# Patient Record
Sex: Female | Born: 1960 | Race: White | Hispanic: No | Smoking: Never smoker
Health system: Southern US, Community
[De-identification: ages and names within clinical notes are randomized; demographics above are authoritative.]

## PROBLEM LIST (undated history)

## (undated) DIAGNOSIS — M51369 Other intervertebral disc degeneration, lumbar region without mention of lumbar back pain or lower extremity pain: Secondary | ICD-10-CM

## (undated) DIAGNOSIS — G709 Myoneural disorder, unspecified: Secondary | ICD-10-CM

## (undated) DIAGNOSIS — T7840XA Allergy, unspecified, initial encounter: Secondary | ICD-10-CM

## (undated) DIAGNOSIS — M5136 Other intervertebral disc degeneration, lumbar region: Secondary | ICD-10-CM

## (undated) DIAGNOSIS — E538 Deficiency of other specified B group vitamins: Secondary | ICD-10-CM

## (undated) DIAGNOSIS — E559 Vitamin D deficiency, unspecified: Secondary | ICD-10-CM

## (undated) DIAGNOSIS — R0789 Other chest pain: Secondary | ICD-10-CM

## (undated) DIAGNOSIS — M5416 Radiculopathy, lumbar region: Secondary | ICD-10-CM

## (undated) DIAGNOSIS — D509 Iron deficiency anemia, unspecified: Secondary | ICD-10-CM

## (undated) DIAGNOSIS — E119 Type 2 diabetes mellitus without complications: Secondary | ICD-10-CM

## (undated) DIAGNOSIS — M1990 Unspecified osteoarthritis, unspecified site: Secondary | ICD-10-CM

## (undated) DIAGNOSIS — G894 Chronic pain syndrome: Secondary | ICD-10-CM

## (undated) DIAGNOSIS — L259 Unspecified contact dermatitis, unspecified cause: Secondary | ICD-10-CM

## (undated) DIAGNOSIS — R079 Chest pain, unspecified: Secondary | ICD-10-CM

## (undated) DIAGNOSIS — M48061 Spinal stenosis, lumbar region without neurogenic claudication: Secondary | ICD-10-CM

## (undated) DIAGNOSIS — F32A Depression, unspecified: Secondary | ICD-10-CM

## (undated) HISTORY — PX: BACK SURGERY: SHX140

## (undated) HISTORY — DX: Iron deficiency anemia, unspecified: D50.9

## (undated) HISTORY — PX: CHOLECYSTECTOMY: SHX55

## (undated) HISTORY — DX: Chest pain, unspecified: R07.9

## (undated) HISTORY — DX: Other chest pain: R07.89

## (undated) HISTORY — DX: Unspecified osteoarthritis, unspecified site: M19.90

## (undated) HISTORY — PX: BUNIONECTOMY: SHX129

## (undated) HISTORY — DX: Type 2 diabetes mellitus without complications: E11.9

## (undated) HISTORY — DX: Radiculopathy, lumbar region: M54.16

## (undated) HISTORY — PX: APPENDECTOMY: SHX54

## (undated) HISTORY — DX: Allergy, unspecified, initial encounter: T78.40XA

## (undated) HISTORY — DX: Unspecified contact dermatitis, unspecified cause: L25.9

---

## 2005-10-11 ENCOUNTER — Emergency Department: Payer: Self-pay | Admitting: Emergency Medicine

## 2005-10-12 ENCOUNTER — Ambulatory Visit: Payer: Self-pay | Admitting: Emergency Medicine

## 2005-11-13 ENCOUNTER — Ambulatory Visit: Payer: Self-pay | Admitting: Podiatry

## 2006-07-27 ENCOUNTER — Observation Stay: Payer: Self-pay | Admitting: General Surgery

## 2007-04-26 ENCOUNTER — Emergency Department: Payer: Self-pay

## 2008-11-02 HISTORY — PX: LUMBAR SPINE SURGERY: SHX701

## 2012-11-02 HISTORY — PX: KNEE ARTHROSCOPY: SHX127

## 2012-11-02 HISTORY — PX: BUNIONECTOMY: SHX129

## 2012-11-02 HISTORY — PX: CHOLECYSTECTOMY: SHX55

## 2013-11-02 HISTORY — PX: APPENDECTOMY: SHX54

## 2013-11-28 ENCOUNTER — Ambulatory Visit: Payer: Self-pay | Admitting: Family Medicine

## 2013-12-06 ENCOUNTER — Ambulatory Visit: Payer: Self-pay | Admitting: Pain Medicine

## 2014-01-17 ENCOUNTER — Ambulatory Visit: Payer: Self-pay | Admitting: Pain Medicine

## 2014-01-17 LAB — BASIC METABOLIC PANEL
Anion Gap: 2 — ABNORMAL LOW (ref 7–16)
BUN: 8 mg/dL (ref 7–18)
Calcium, Total: 8.5 mg/dL (ref 8.5–10.1)
Chloride: 109 mmol/L — ABNORMAL HIGH (ref 98–107)
Co2: 27 mmol/L (ref 21–32)
Creatinine: 0.69 mg/dL (ref 0.60–1.30)
EGFR (African American): >60
EGFR (Non-African Amer.): >60
Glucose: 88 mg/dL (ref 65–99)
Osmolality: 273 (ref 275–301)
Potassium: 4.1 mmol/L (ref 3.5–5.1)
Sodium: 138 mmol/L (ref 136–145)

## 2014-01-17 LAB — HEPATIC FUNCTION PANEL A (ARMC)
Albumin: 3.6 g/dL (ref 3.4–5.0)
Alkaline Phosphatase: 70 U/L
Bilirubin, Direct: <0.1 mg/dL (ref 0.00–0.20)
Bilirubin,Total: 0.3 mg/dL (ref 0.2–1.0)
SGOT(AST): 15 U/L (ref 15–37)
SGPT (ALT): 12 U/L (ref 12–78)
Total Protein: 7.1 g/dL (ref 6.4–8.2)

## 2014-01-17 LAB — MAGNESIUM: MAGNESIUM: 1.9 mg/dL

## 2014-01-17 LAB — SEDIMENTATION RATE: Erythrocyte Sed Rate: 23 mm/hr (ref 0–30)

## 2014-01-23 ENCOUNTER — Ambulatory Visit: Payer: Self-pay | Admitting: Pain Medicine

## 2014-02-12 ENCOUNTER — Ambulatory Visit: Payer: Self-pay | Admitting: Pain Medicine

## 2014-03-01 DIAGNOSIS — G8929 Other chronic pain: Secondary | ICD-10-CM | POA: Insufficient documentation

## 2014-03-01 DIAGNOSIS — L259 Unspecified contact dermatitis, unspecified cause: Secondary | ICD-10-CM | POA: Insufficient documentation

## 2014-03-01 DIAGNOSIS — M549 Dorsalgia, unspecified: Secondary | ICD-10-CM

## 2014-03-01 DIAGNOSIS — E538 Deficiency of other specified B group vitamins: Secondary | ICD-10-CM | POA: Insufficient documentation

## 2014-03-01 DIAGNOSIS — M23209 Derangement of unspecified meniscus due to old tear or injury, unspecified knee: Secondary | ICD-10-CM | POA: Insufficient documentation

## 2014-03-01 DIAGNOSIS — E559 Vitamin D deficiency, unspecified: Secondary | ICD-10-CM | POA: Insufficient documentation

## 2014-03-01 DIAGNOSIS — M48061 Spinal stenosis, lumbar region without neurogenic claudication: Secondary | ICD-10-CM | POA: Insufficient documentation

## 2014-03-01 HISTORY — DX: Unspecified contact dermatitis, unspecified cause: L25.9

## 2014-03-12 ENCOUNTER — Ambulatory Visit: Payer: Self-pay | Admitting: Pain Medicine

## 2014-04-09 ENCOUNTER — Observation Stay (HOSPITAL_COMMUNITY)
Admission: EM | Admit: 2014-04-09 | Discharge: 2014-04-10 | Disposition: A | Discharge status: Home / Self Care | Payer: Medicaid Other | Attending: Family Medicine | Admitting: Family Medicine

## 2014-04-09 ENCOUNTER — Emergency Department (HOSPITAL_COMMUNITY): Payer: Medicaid Other

## 2014-04-09 ENCOUNTER — Encounter (HOSPITAL_COMMUNITY): Payer: Self-pay | Admitting: Emergency Medicine

## 2014-04-09 DIAGNOSIS — R55 Syncope and collapse: Secondary | ICD-10-CM | POA: Diagnosis not present

## 2014-04-09 DIAGNOSIS — R079 Chest pain, unspecified: Secondary | ICD-10-CM

## 2014-04-09 DIAGNOSIS — D509 Iron deficiency anemia, unspecified: Secondary | ICD-10-CM

## 2014-04-09 DIAGNOSIS — R42 Dizziness and giddiness: Secondary | ICD-10-CM | POA: Diagnosis not present

## 2014-04-09 DIAGNOSIS — R0789 Other chest pain: Secondary | ICD-10-CM

## 2014-04-09 DIAGNOSIS — R110 Nausea: Secondary | ICD-10-CM | POA: Diagnosis not present

## 2014-04-09 HISTORY — DX: Other chest pain: R07.89

## 2014-04-09 HISTORY — DX: Chest pain, unspecified: R07.9

## 2014-04-09 HISTORY — DX: Iron deficiency anemia, unspecified: D50.9

## 2014-04-09 LAB — TROPONIN I: Troponin I: <0.3 ng/mL (ref <0.30)

## 2014-04-09 LAB — URINALYSIS, ROUTINE W REFLEX MICROSCOPIC
BILIRUBIN URINE: NEGATIVE
Glucose, UA: NEGATIVE mg/dL
Hgb urine dipstick: NEGATIVE
KETONES UR: NEGATIVE mg/dL
Leukocytes, UA: NEGATIVE
Nitrite: NEGATIVE
Protein, ur: NEGATIVE mg/dL
Specific Gravity, Urine: 1.008 (ref 1.005–1.030)
Urobilinogen, UA: 0.2 mg/dL (ref 0.0–1.0)
pH: 6 (ref 5.0–8.0)

## 2014-04-09 LAB — I-STAT TROPONIN, ED: TROPONIN I, POC: 0 ng/mL (ref 0.00–0.08)

## 2014-04-09 LAB — COMPREHENSIVE METABOLIC PANEL
ALT: 9 U/L (ref 0–35)
AST: 12 U/L (ref 0–37)
Albumin: 3.4 g/dL — ABNORMAL LOW (ref 3.5–5.2)
Alkaline Phosphatase: 73 U/L (ref 39–117)
BUN: 11 mg/dL (ref 6–23)
CO2: 25 mEq/L (ref 19–32)
CREATININE: 0.59 mg/dL (ref 0.50–1.10)
Calcium: 9 mg/dL (ref 8.4–10.5)
Chloride: 103 mEq/L (ref 96–112)
GLUCOSE: 145 mg/dL — AB (ref 70–99)
Potassium: 4 mEq/L (ref 3.7–5.3)
SODIUM: 139 meq/L (ref 137–147)
TOTAL PROTEIN: 6.9 g/dL (ref 6.0–8.3)
Total Bilirubin: <0.2 mg/dL — ABNORMAL LOW (ref 0.3–1.2)

## 2014-04-09 LAB — CBC
HEMATOCRIT: 30.1 % — AB (ref 36.0–46.0)
Hemoglobin: 9.1 g/dL — ABNORMAL LOW (ref 12.0–15.0)
MCH: 23.4 pg — ABNORMAL LOW (ref 26.0–34.0)
MCHC: 30.2 g/dL (ref 30.0–36.0)
MCV: 77.4 fL — ABNORMAL LOW (ref 78.0–100.0)
Platelets: 352 10*3/uL (ref 150–400)
RBC: 3.89 MIL/uL (ref 3.87–5.11)
RDW: 16.7 % — ABNORMAL HIGH (ref 11.5–15.5)
WBC: 11.5 10*3/uL — ABNORMAL HIGH (ref 4.0–10.5)

## 2014-04-09 LAB — PRO B NATRIURETIC PEPTIDE: PRO B NATRI PEPTIDE: 103.6 pg/mL (ref 0–125)

## 2014-04-09 MED ORDER — ENOXAPARIN SODIUM 40 MG/0.4ML ~~LOC~~ SOLN
40.0000 mg | SUBCUTANEOUS | Status: DC
  Administered 2014-04-09: 40 mg via SUBCUTANEOUS
  Filled 2014-04-09 (×2): qty 0.4

## 2014-04-09 MED ORDER — ONDANSETRON 4 MG PO TBDP
4.0000 mg | ORAL_TABLET | Freq: Three times a day (TID) | ORAL | Status: DC | PRN
  Administered 2014-04-09: 4 mg via ORAL
  Filled 2014-04-09 (×2): qty 1

## 2014-04-09 MED ORDER — SODIUM CHLORIDE 0.9 % IV BOLUS (SEPSIS)
500.0000 mL | Freq: Once | INTRAVENOUS | Status: AC
  Administered 2014-04-09: 500 mL via INTRAVENOUS

## 2014-04-09 MED ORDER — ONDANSETRON HCL 4 MG/2ML IJ SOLN
4.0000 mg | Freq: Once | INTRAMUSCULAR | Status: AC
  Administered 2014-04-09: 4 mg via INTRAVENOUS
  Filled 2014-04-09: qty 2

## 2014-04-09 MED ORDER — MORPHINE SULFATE 2 MG/ML IJ SOLN: 2.0000 mg | INTRAMUSCULAR | Status: DC | PRN

## 2014-04-09 MED ORDER — ACETAMINOPHEN 500 MG PO TABS
1000.0000 mg | ORAL_TABLET | Freq: Once | ORAL | Status: AC
  Administered 2014-04-09: 1000 mg via ORAL
  Filled 2014-04-09: qty 2

## 2014-04-09 MED ORDER — TRAZODONE HCL 50 MG PO TABS
50.0000 mg | ORAL_TABLET | Freq: Every evening | ORAL | Status: DC | PRN
  Filled 2014-04-09 (×2): qty 1

## 2014-04-09 MED ORDER — ASPIRIN EC 81 MG PO TBEC
81.0000 mg | DELAYED_RELEASE_TABLET | Freq: Every day | ORAL | Status: DC
  Administered 2014-04-10: 81 mg via ORAL
  Filled 2014-04-09 (×2): qty 1

## 2014-04-09 MED ORDER — NITROGLYCERIN 2 % TD OINT
0.5000 [in_us] | TOPICAL_OINTMENT | Freq: Once | TRANSDERMAL | Status: AC
  Administered 2014-04-09: 0.5 [in_us] via TOPICAL
  Filled 2014-04-09: qty 1

## 2014-04-09 MED ORDER — NITROGLYCERIN 0.4 MG SL SUBL
0.4000 mg | SUBLINGUAL_TABLET | SUBLINGUAL | Status: DC | PRN
  Administered 2014-04-09: 0.4 mg via SUBLINGUAL
  Filled 2014-04-09: qty 1

## 2014-04-09 MED ORDER — PROMETHAZINE HCL 25 MG/ML IJ SOLN
25.0000 mg | Freq: Once | INTRAMUSCULAR | Status: AC
  Administered 2014-04-10: 25 mg via INTRAVENOUS
  Filled 2014-04-09: qty 1

## 2014-04-09 MED ORDER — LORAZEPAM 1 MG PO TABS
1.0000 mg | ORAL_TABLET | Freq: Once | ORAL | Status: AC
  Administered 2014-04-09: 1 mg via ORAL
  Filled 2014-04-09: qty 1

## 2014-04-09 MED ORDER — GI COCKTAIL ~~LOC~~: 30.0000 mL | Freq: Four times a day (QID) | ORAL | Status: DC | PRN

## 2014-04-09 NOTE — ED Provider Notes (Signed)
CSN: [id number]     Arrival date & time 04/09/14  1504 History   First MD Initiated Contact with Patient 04/09/14 1604     Chief Complaint  Patient presents with  . Chest Pain    pressure     (Consider location/radiation/quality/duration/timing/severity/associated sxs/prior Treatment) Patient is a 53 y.o. female presenting with chest pain. The history is provided by the patient.  Chest Pain Pain location:  Substernal area Pain quality: pressure   Pain radiates to:  Does not radiate Pain radiates to the back: no   Pain severity:  Severe Onset quality:  Sudden Timing:  Constant Progression:  Waxing and waning Chronicity:  Recurrent (had once before 3 years ago) Context: lifting (cleaning an apartment)   Context: not breathing and not at rest   Relieved by:  Nitroglycerin Worsened by:  Nothing tried Associated symptoms: dizziness and nausea   Associated symptoms: no abdominal pain, no fever, no shortness of breath and not vomiting     History reviewed. No pertinent past medical history. Past Surgical History  Procedure Laterality Date  . Back surgery    . Appendectomy    . Cholecystectomy    . Cesarean section      X 2  . Bunionectomy      BOTH FEET   No family history on file. History  Substance Use Topics  . Smoking status: Never Smoker   . Smokeless tobacco: Not on file  . Alcohol Use: No   OB History   Grav Para Term Preterm Abortions TAB SAB Ect Mult Living                 Review of Systems  Constitutional: Negative for fever and chills.  Respiratory: Negative for shortness of breath.   Cardiovascular: Positive for chest pain. Negative for leg swelling.       Near-syncope  Gastrointestinal: Positive for nausea. Negative for vomiting, abdominal pain and diarrhea.  Neurological: Positive for dizziness.  All other systems reviewed and are negative.     Allergies  Review of patient's allergies indicates no known allergies.  Home Medications   Prior  to Admission medications   Not on File   BP 128/60  Pulse 91  Temp(Src) 97.9 F (36.6 C) (Oral)  Resp 15  Ht 5\' 7"  (1.702 m)  Wt 215 lb (97.523 kg)  BMI 33.67 kg/m2  SpO2 100%  LMP 04/02/2014 Physical Exam  Nursing note and vitals reviewed. Constitutional: She is oriented to person, place, and time. She appears well-developed and well-nourished. No distress.  HENT:  Head: Normocephalic and atraumatic.  Eyes: EOM are normal. Pupils are equal, round, and reactive to light.  Neck: Normal range of motion. Neck supple.  Cardiovascular: Normal rate and regular rhythm.  Exam reveals no friction rub.   No murmur heard. Pulmonary/Chest: Effort normal and breath sounds normal. No respiratory distress. She has no wheezes. She has no rales.  Abdominal: Soft. She exhibits no distension. There is no tenderness. There is no rebound.  Musculoskeletal: Normal range of motion. She exhibits no edema.  Neurological: She is alert and oriented to person, place, and time.  Skin: She is not diaphoretic.    ED Course  Procedures (including critical care time) Labs Review Labs Reviewed  CBC - Abnormal; Notable for the following:    WBC 11.5 (*)    Hemoglobin 9.1 (*)    HCT 30.1 (*)    MCV 77.4 (*)    MCH 23.4 (*)    RDW  16.7 (*)    All other components within normal limits  COMPREHENSIVE METABOLIC PANEL - Abnormal; Notable for the following:    Glucose, Bld 145 (*)    Albumin 3.4 (*)    Total Bilirubin <0.2 (*)    All other components within normal limits  PRO B NATRIURETIC PEPTIDE  URINALYSIS, ROUTINE W REFLEX MICROSCOPIC  I-STAT TROPOININ, ED    Imaging Review Dg Chest 2 View  04/09/2014   CLINICAL DATA:  Chest pain. Dizziness and chest tightness. Shortness of breath for 1 day.  EXAM: CHEST  2 VIEW  COMPARISON:  None.  FINDINGS: Heart size is upper normal/borderline enlarged. Mediastinal contours are normal. The trachea is midline. Pulmonary vascularity is normal. There is a very small  opacity in the lingula. Otherwise, the lung fields are clear. Negative for pleural effusion or pneumothorax. The bony thorax is unremarkable. Visualized bowel gas pattern is normal.  IMPRESSION: 1. Borderline cardiomegaly. 2. Very small opacity in the lingula likely reflects atelectasis or scarring. If there is clinical concern for pneumonia, followup chest radiograph in 1-2 days could be considered.   Electronically Signed   By: Curlene Dolphin M.D.   On: 04/09/2014 16:46     EKG Interpretation None      Date: 04/09/2014  Rate: 68  Rhythm: sinus arrhythmia  QRS Axis: normal  Intervals: normal  ST/T Wave abnormalities: normal  Conduction Disutrbances:none  Narrative Interpretation:   Old EKG Reviewed: none available   MDM   Final diagnoses:  Chest pain    76F here with central heaviness type chest pain without radiation that began while cleaning. No hx of CAD, no prior cardiac cath. Did have a chemical stress test 2-3 years ago in [place] that was normal.  Pain still present, roughly 6/10. Had some NTG en route with transient relief. Had aspirin en route.  Exam with stable vitals, she is uncomfortable. No abdominal tenderness, no concern for reflux, biliary related issue. Hx of GERD, states doesn't feel like her prior GERD. Concern for cardiac etiology. Labs ok. EKG ok, no prior for comparison. Will give NTG and reassess to see if pain goes away. She is a HEART score of 3 - 2 for highly suspicious story, 1 for age.  Pain resolved after NTG, patient admitted to [facility] for further monitoring.  Osvaldo Shipper, MD 04/09/14 2223

## 2014-04-09 NOTE — H&P (Signed)
[facility] Admission History and Physical Service Pager: [phone]  Patient name: Stephanie [name] Medical record number: [id number] Date of birth: [birth year] Age: 53 y.o. Gender: female  Primary Care Provider: No primary provider on file. Consultants: None Code Status: Full  Chief Complaint: Chest pain  Assessment and Plan: Stephanie [name] is a 53 y.o. female presenting with atypical chest pain. PMH is significant for obesity  Atypical angina: Substernal, relieved by rest/NTG, not necessarily exertional. HEART score: 4. Likely component of anxiety (reported as a very stressful time at onset) in woman who's only known risk factor is obesity (BMI 33) with a previously normal pharmacologic stress test makes her low risk, but without necessarily reliable follow up. CXR: questionable infiltrate in setting of "cold" and very mild leukocytosis (11.5) makes pulmonary etiology unlikely. Wells score: 0 - Admit to FMTS on telemetry - Repeat ECG in AM (nsr without ST segment changes in ED) - Cycle troponins (1st is neg) - Euvolemic, pro-BNP 103.6 - Lipid panel, Hb A1c, TSH - Morphine 2 mg IV q2h prn chest pain  - Nitroglycerin 0.4mg  SL q30min prn chest pain  - O2 by [place] prn hypoxemia - Aspirin 81mg    Nausea: Symptomatic management - GI cocktail prn - Zofran ODT 4mg  q8h prn   Microcytic anemia: Hgb 9.1. Possibly contributing to increased cardiac demand.  - Defer to outpatient management.  - Monitor CBC tomorrow AM  History of back pain: Sees pain management - Continue home regimen: hydrocodone 10/325mg  po BID and meloxicam 15mg  daily  FEN/GI: SLIV, heart-healthy diet Prophylaxis: Lovenox  Disposition: Admit to FMTS, attending Dr. Andria Frames, on telemetry unit  History of Present Illness: Stephanie [name] is a 53 y.o. female presenting with chest pain.   Stephanie [name] noticed chest pain while helping her daughter pack clothes for her permanent move to her father's house.  She is divorced from her daughter's father and believes the stress of her daughter moving out contributed to the chest pain as she reports that she was very emotional at that time, but not overly exerting herself physically. She felt like she was going to pass out, substernal chest heaviness, sick to her stomach with nausea. No emesis. She called her husband who is a Social research officer, government who, upon arrival describes her as appearing pale, cold to the touch, and sweaty. He checked her vital signs which were normal. She continued to have chest pain so they called EMS. En route pain continued despite nitroglycerin. Pain is in the middle of her chest, non-radiating, associated with light-headedness, headache and diaphoresis, and non-reproducible. Chest pain subsided with the 3rd round of nitroglycerin in the ED and is currently gone. She does endorse an upset stomach which was helped with zofran.    She had an episode of chest pain and light-headedness 3 years ago, after which she had a pharmacologic stress test (due to bad knees) which she reports as normal.   She endorses a productive cough and a "cold" for the past week. No history of blood clots or immobility. She denies dysuria or urinary frequency/urgency.   Review Of Systems: Per HPI  Otherwise 12 point review of systems was performed and was unremarkable.  Past Medical History: She does not have DM, HTN, hyperlipidemia. She has a torn meniscus and a back pain.   Past Surgical History: Past Surgical History  Procedure Laterality Date  . Back surgery    . Appendectomy    . Cholecystectomy    . Cesarean section  X 2  . Bunionectomy      BOTH FEET   Social History: History  Substance Use Topics  . Smoking status: Never Smoker   . Smokeless tobacco: Not on file  . Alcohol Use: No   Additional social history: Non-smoker, doesn't drink, no illicit drugs Please also refer to relevant sections of EMR.  Family History: +DM; 76 yo  mother had CVA x2;  No FH of heart disease  Allergies and Medications: No Known Allergies  Meloxicam Hydrocodone 2 tabs per day Flexeril (takes rarely) Trazodone prn sleep (rarely takes this) Vit D  Objective: BP 101/62  Pulse 91  Temp(Src) 97.9 F (36.6 C) (Oral)  Resp 20  Ht 5\' 7"  (1.702 m)  Wt 215 lb (97.523 kg)  BMI 33.67 kg/m2  SpO2 100%  LMP 04/02/2014 Exam: General: Pleasant, obese 53 yo female walking from the bathroom in NAD HEENT: NCAT, MMM, PERRL, oropharynx wnl Cardiovascular: RRR, no murmur, rub or gallop. JVD difficult to appreciate. No pain on chest wall palpation.  Respiratory: Non-labored on room air. CTAB.  Abdomen: Obese, +BS, soft, NT, ND Extremities: WWP, trace pitting edema bilateral LE's. No palpable cords, negative homan's sign Skin: Dry, tanned, no rashes, wounds, or conspicuous lesions Neuro: Alert and oriented, speech and gait normal. Non-focal exam.   Labs and Imaging: CBC BMET   Recent Labs Lab 04/09/14 1550  WBC 11.5*  HGB 9.1*  HCT 30.1*  PLT 352    Recent Labs Lab 04/09/14 1550  NA 139  K 4.0  CL 103  CO2 25  BUN 11  CREATININE 0.59  GLUCOSE 145*  CALCIUM 9.0    Trop: neg Pro-BNP: 103.6  ECG: NSR, normal axis and intervals. No ST segment changes.   CXR: Borderline cardiomegaly, lingula atelectasis vs. possible infiltrate  Vance Gather, MD 04/09/2014, 5:35 PM PGY-1, [place] Intern pager: [phone], text pages welcome   Family Medicine Upper Level Addendum:   I have seen and examined the patient independently, discussed with Dr. Bonner Puna, fully reviewed the H+P and agree with it's contents with the additions as noted in blue text.    Garret Reddish, MD, PGY-3 04/09/2014 9:37 PM

## 2014-04-09 NOTE — ED Notes (Signed)
Discussed with patient and family that a side effect of nitro is headache after verifying it's proper placement.  Patient complains of "upset stomach".  Will continue to monitor.

## 2014-04-09 NOTE — ED Notes (Signed)
MD at bedside. Internal Med

## 2014-04-09 NOTE — Progress Notes (Signed)
Family Medicine Teaching Service Daily Progress Note Intern Pager: [phone]  Patient name: Stephanie [name] Medical record number: [id number] Date of birth: [birth year] Age: 53 y.o. Gender: female  Primary Care Provider: No primary provider on file. Consultants: None Code Status: Full Code  Pt Overview and Major Events to Date:  6/8: patient admitted  Assessment and Plan: Stephanie [name] is a 53 y.o. female presenting with atypical chest pain. PMH is significant for obesity   # Atypical angina, ruled out MI - Resolved - Ruled out MI. troponin negative x 3. TSH wnl, Triglycerides slightly elevated. EKG stable - Morphine 2 mg IV q2h prn for chest pain  - Nitroglycerin 0.4mg  SL q45min prn for chest pain  - No O2 requirement, >99% on RA - Aspirin 81mg  - Cardiology consulted - greatly appreciate recommendations - Lexiscan myoview completed, demonstrated no evidence of ischemia, no ST-T changes - 2D ECHO completed  # Nausea: - Resolved - GI cocktail prn  - Zofran ODT 4mg  q8h prn   # Microcytic anemia: Hgb 9.1 on admission. Currently down to 8.7. Possibly contributing to increased cardiac demand.  - Defer to outpatient management.   # History of back pain: Sees pain management   FEN/GI: SLIV, heart-healthy diet  Prophylaxis: Lovenox  Disposition: Discharge home after negative Lexiscan myoview stress test, completed ECHO.  Subjective: Patient down for stress test.  UPDATE 1545 - Resting in bed, reports tired from poor sleep overnight, but otherwise much improved. Denies any CP, SOB, abd pain, or heartburn. States she is ready to go home.  Objective: Temp:  [97.3 F (36.3 C)] 97.3 F (36.3 C) (06/08 2122) Pulse Rate:  [62-96] 86 (06/09 1005) Resp:  [12-20] 18 (06/08 2122) BP: (101-137)/(45-77) 126/69 mmHg (06/09 1005) SpO2:  [98 %-100 %] 99 % (06/09 0051) Weight:  [233 lb 3.2 oz (105.779 kg)] 233 lb 3.2 oz (105.779 kg) (06/08 2122)  Physical Exam: Gen - tired but  welll-appearing, NAD Heart - RRR, no murmurs heard Lungs - CTAB, no wheezing, crackles, or rhonchi. Normal work of breathing. Abd - soft, NTND, no masses, +active BS Ext - non-tender, no edema, peripheral pulses intact +2 b/l Neuro - awake, alert, oriented, grossly non-focal  Laboratory:  Recent Labs Lab 04/09/14 1550 04/10/14 0500  WBC 11.5* 7.9  HGB 9.1* 8.7*  HCT 30.1* 28.6*  PLT 352 298    Recent Labs Lab 04/09/14 1550  NA 139  K 4.0  CL 103  CO2 25  BUN 11  CREATININE 0.59  CALCIUM 9.0  PROT 6.9  BILITOT <0.2*  ALKPHOS 73  ALT 9  AST 12  GLUCOSE 145*   Risk Stratification Labs  TSH    Component Value Date/Time   TSH 0.733 04/10/2014 0500   Hemoglobin A1C    Component Value Date/Time   HGBA1C 6.2* 04/10/2014 0500   Lipid Panel     Component Value Date/Time   CHOL 139 04/10/2014 0500   TRIG 163* 04/10/2014 0500   HDL 33* 04/10/2014 0500   CHOLHDL 4.2 04/10/2014 0500   VLDL 33 04/10/2014 0500   LDLCALC 73 04/10/2014 0500   Cardiac Panel (last 3 results)  Recent Labs  04/09/14 2214 04/10/14 0250 04/10/14 1200  TROPONINI <0.30 <0.30 <0.30   Imaging/Diagnostic Tests:  Dg Chest 2 View  04/09/2014   CLINICAL DATA:  Chest pain. Dizziness and chest tightness. Shortness of breath for 1 day.  EXAM: CHEST  2 VIEW  COMPARISON:  None.  FINDINGS: Heart size is upper normal/borderline enlarged.  Mediastinal contours are normal. The trachea is midline. Pulmonary vascularity is normal. There is a very small opacity in the lingula. Otherwise, the lung fields are clear. Negative for pleural effusion or pneumothorax. The bony thorax is unremarkable. Visualized bowel gas pattern is normal.  IMPRESSION: 1. Borderline cardiomegaly. 2. Very small opacity in the lingula likely reflects atelectasis or scarring. If there is clinical concern for pneumonia, followup chest radiograph in 1-2 days could be considered.   Electronically Signed   By: Curlene Dolphin M.D.   On: 04/09/2014 16:46    6/9 Lexiscan Myoview Stress Test Negative study - Completed without complications. No ST changes to indicate ischemia.  6/9 2D ECHO (completed)   Nobie Putnam, DO 04/10/2014, 3:59 PM PGY-1, [place] Intern pager: [phone], text pages welcome

## 2014-04-09 NOTE — ED Notes (Signed)
Pt via EMS presents with centralized chest pressure while doing light cleaning in her home. No radiation, no increase with palp or breathing. Pt has a dry cough. EKG normal. Gave 324 ASA, 4 zofran, 2 SL nitro. Pain 10/10 now 7/10. No known HX.

## 2014-04-09 NOTE — Discharge Summary (Signed)
[facility] Discharge Summary  Patient name: Stephanie [name] Medical record number: [id number] Date of birth: [birth year] Age: 53 y.o. Gender: female Date of Admission: 04/09/2014  Date of Discharge: 04/10/2014 Admitting Physician: Zigmund Gottron, MD  Primary Care Provider: No primary provider on file. Consultants: Cardiology  Indication for Hospitalization: Chest pain  Discharge Diagnoses/Problem List:  Chest pain Nausea Microcytic anemia Back pain  Disposition: Discharge home  Discharge Condition: Stable  Discharge Exam:   Performed by Dr. Nobie Putnam Gen - tired but welll-appearing, NAD  Heart - RRR, no murmurs heard  Lungs - CTAB, no wheezing, crackles, or rhonchi. Normal work of breathing.  Abd - soft, NTND, no masses, +active BS  Ext - non-tender, no edema, peripheral pulses intact +2 b/l  Neuro - awake, alert, oriented, grossly non-focal  Brief Hospital Course:  Patient admitted for atypical chest pain. Patient received nitro x3 before admission which helped pain. Troponin cycled and negative. EKG showed normal sinus rhythm. Monitored on telemetry with no abnormal events. TSH within normal limits. Triglycerides elevated with low HDL. A1C elevated at 6.2. Hemoglobin remained stable, nausea treated with Zofran and back pain controlled with home regimen of Percocet and Mobic. No chest pain while admitted. Patient discharged home with instructions for follow-up with a PCP.   Issues for Follow Up:  1. Pre-diabetic. Recommend dietary modification outpatient 2. Patient has asymptomatic anemia  Significant Procedures: 1. Nuclear stress test 2. 2D echocardiogram  Significant Labs and Imaging:   Recent Labs Lab 04/09/14 1550 04/10/14 0500  WBC 11.5* 7.9  HGB 9.1* 8.7*  HCT 30.1* 28.6*  PLT 352 298    Recent Labs Lab 04/09/14 1550  NA 139  K 4.0  CL 103  CO2 25  GLUCOSE 145*  BUN 11  CREATININE 0.59  CALCIUM 9.0   ALKPHOS 73  AST 12  ALT 9  ALBUMIN 3.4*   Risk Stratification Labs  TSH    Component Value Date/Time   TSH 0.733 04/10/2014 0500   Hemoglobin A1C    Component Value Date/Time   HGBA1C 6.2* 04/10/2014 0500   Lipid Panel     Component Value Date/Time   CHOL 139 04/10/2014 0500   TRIG 163* 04/10/2014 0500   HDL 33* 04/10/2014 0500   CHOLHDL 4.2 04/10/2014 0500   VLDL 33 04/10/2014 0500   LDLCALC 73 04/10/2014 0500   Cardiac Panel (last 3 results)  Recent Labs  04/09/14 2214 04/10/14 0250 04/10/14 1200  TROPONINI <0.30 <0.30 <0.30    Results/Tests Pending at Time of Discharge: None  Discharge Medications:    Medication List    STOP taking these medications       EXCEDRIN PO     TYLENOL SINUS MAX ST PO      TAKE these medications       aspirin 81 MG EC tablet  Take 1 tablet (81 mg total) by mouth daily.     cyclobenzaprine 10 MG tablet  Commonly known as:  FLEXERIL  Take 10 mg by mouth every evening.     HYDROcodone-acetaminophen 10-325 MG per tablet  Commonly known as:  NORCO  Take 1 tablet by mouth 2 (two) times daily.     Melatonin 10 MG Caps  Take 10 mg by mouth at bedtime.     meloxicam 15 MG tablet  Commonly known as:  MOBIC  Take 15 mg by mouth every morning.     ROBITUSSIN DM PO  Take 2 tablets by mouth 2 (  two) times daily.     traZODone 50 MG tablet  Commonly known as:  DESYREL  Take 50 mg by mouth at bedtime as needed for sleep.     Vitamin D (Ergocalciferol) 50000 UNITS Caps capsule  Commonly known as:  DRISDOL  Take 50,000 Units by mouth 2 (two) times a week. Mon and Thurs     Vitamin D3 5000 UNITS Caps  Take 5,000 Units by mouth daily.        Discharge Instructions: Please refer to Patient Instructions section of EMR for full details.  Patient was counseled important signs and symptoms that should prompt return to medical care, changes in medications, dietary instructions, activity restrictions, and follow up appointments.   Follow-Up  Appointments: Follow-up Information   Follow up with [facility]    . Schedule an appointment as soon as possible for a visit in 1 week. (For hospital follow-up)    Contact information:   [address] [id number] [phone]      Cordelia Poche, MD 04/10/2014, 11:36 PM PGY-1, [place]

## 2014-04-09 NOTE — ED Notes (Signed)
Patient transported to X-ray 

## 2014-04-10 ENCOUNTER — Observation Stay (HOSPITAL_COMMUNITY): Payer: Medicaid Other

## 2014-04-10 DIAGNOSIS — R079 Chest pain, unspecified: Secondary | ICD-10-CM

## 2014-04-10 DIAGNOSIS — I517 Cardiomegaly: Secondary | ICD-10-CM

## 2014-04-10 LAB — LIPID PANEL
CHOL/HDL RATIO: 4.2 ratio
Cholesterol: 139 mg/dL (ref 0–200)
HDL: 33 mg/dL — AB (ref >39)
LDL Cholesterol: 73 mg/dL (ref 0–99)
TRIGLYCERIDES: 163 mg/dL — AB (ref <150)
VLDL: 33 mg/dL (ref 0–40)

## 2014-04-10 LAB — TROPONIN I
Troponin I: <0.3 ng/mL (ref <0.30)
Troponin I: <0.3 ng/mL (ref <0.30)

## 2014-04-10 LAB — CBC
HEMATOCRIT: 28.6 % — AB (ref 36.0–46.0)
Hemoglobin: 8.7 g/dL — ABNORMAL LOW (ref 12.0–15.0)
MCH: 23.6 pg — AB (ref 26.0–34.0)
MCHC: 30.4 g/dL (ref 30.0–36.0)
MCV: 77.5 fL — AB (ref 78.0–100.0)
Platelets: 298 10*3/uL (ref 150–400)
RBC: 3.69 MIL/uL — ABNORMAL LOW (ref 3.87–5.11)
RDW: 16.8 % — ABNORMAL HIGH (ref 11.5–15.5)
WBC: 7.9 10*3/uL (ref 4.0–10.5)

## 2014-04-10 LAB — TSH: TSH: 0.733 u[IU]/mL (ref 0.350–4.500)

## 2014-04-10 LAB — HEMOGLOBIN A1C
Hgb A1c MFr Bld: 6.2 % — ABNORMAL HIGH (ref <5.7)
MEAN PLASMA GLUCOSE: 131 mg/dL — AB (ref <117)

## 2014-04-10 MED ORDER — ASPIRIN 81 MG PO TBEC: 81.0000 mg | DELAYED_RELEASE_TABLET | Freq: Every day | ORAL | Status: DC

## 2014-04-10 MED ORDER — REGADENOSON 0.4 MG/5ML IV SOLN
0.4000 mg | Freq: Once | INTRAVENOUS | Status: AC
  Administered 2014-04-10: 0.4 mg via INTRAVENOUS

## 2014-04-10 MED ORDER — REGADENOSON 0.4 MG/5ML IV SOLN
INTRAVENOUS | Status: AC
  Filled 2014-04-10: qty 5

## 2014-04-10 MED ORDER — LORAZEPAM 1 MG PO TABS
1.0000 mg | ORAL_TABLET | Freq: Once | ORAL | Status: DC
  Filled 2014-04-10: qty 1

## 2014-04-10 MED ORDER — TECHNETIUM TC 99M SESTAMIBI GENERIC - CARDIOLITE
10.0000 | Freq: Once | INTRAVENOUS | Status: AC | PRN
  Administered 2014-04-10: 10 via INTRAVENOUS

## 2014-04-10 MED ORDER — TECHNETIUM TC 99M SESTAMIBI GENERIC - CARDIOLITE
30.0000 | Freq: Once | INTRAVENOUS | Status: AC | PRN
  Administered 2014-04-10: 30 via INTRAVENOUS

## 2014-04-10 NOTE — Progress Notes (Signed)
*  PRELIMINARY RESULTS* Echocardiogram 2D Echocardiogram has been performed.  Elvia Collum 04/10/2014, 4:33 PM

## 2014-04-10 NOTE — Discharge Instructions (Signed)
You were hospitalized due to chest pain. We worked closely with the Cardiologists, and all of our tests showed that your heart was not damaged. The blood tests, EKG, Echocardiogram, and Cardiac Stress test were all normal. - We recommend continuing to take a daily Aspirin - We also have provided contact information for you to follow-up with the [facility] for a hospital follow-up and future doctor's visits.   Chest Pain Observation It is often hard to give a specific diagnosis for the cause of chest pain. Among other possibilities your symptoms might be caused by inadequate oxygen delivery to your heart (angina). Angina that is not treated or evaluated can lead to a heart attack (myocardial infarction) or death. Blood tests, electrocardiograms, and X-rays may have been done to help determine a possible cause of your chest pain. After evaluation and observation, your health care provider has determined that it is unlikely your pain was caused by an unstable condition that requires hospitalization. However, a full evaluation of your pain may need to be completed, with additional diagnostic testing as directed. It is very important to keep your follow-up appointments. Not keeping your follow-up appointments could result in permanent heart damage, disability, or death. If there is any problem keeping your follow-up appointments, you must call your health care provider. HOME CARE INSTRUCTIONS  Due to the slight chance that your pain could be angina, it is important to follow your health care provider's treatment plan and also maintain a healthy lifestyle:  Maintain or work toward achieving a healthy weight.  Stay physically active and exercise regularly.  Decrease your salt intake.  Eat a balanced, healthy diet. Talk to a dietician to learn about heart healthy foods.  Increase your fiber intake by including whole grains, vegetables, fruits, and nuts in your diet.  Avoid  situations that cause stress, anger, or depression.  Take medicines as advised by your health care provider. Report any side effects to your health care provider. Do not stop medicines or adjust the dosages on your own.  Quit smoking. Do not use nicotine patches or gum until you check with your health care provider.  Keep your blood pressure, blood sugar, and cholesterol levels within normal limits.  Limit alcohol intake to no more than 1 drink per day for women that are not pregnant and 2 drinks per day for men.  Do not abuse drugs. SEEK IMMEDIATE MEDICAL CARE IF: You have severe chest pain or pressure which may include symptoms such as:  You feel pain or pressure in you arms, neck, jaw, or back.  You have severe back or abdominal pain, feel sick to your stomach (nauseous), or throw up (vomit).  You are sweating profusely.  You are having a fast or irregular heartbeat.  You feel short of breath while at rest.  You notice increasing shortness of breath during rest, sleep, or with activity.  You have chest pain that does not get better after rest or after taking your usual medicine.  You wake from sleep with chest pain.  You are unable to sleep because you cannot breathe.  You develop a frequent cough or you are coughing up blood.  You feel dizzy, faint, or experience extreme fatigue.  You develop severe weakness, dizziness, fainting, or chills. Any of these symptoms may represent a serious problem that is an emergency. Do not wait to see if the symptoms will go away. Call your local emergency services (911 in the [place] Do not drive yourself  to the hospital. MAKE SURE YOU:  Understand these instructions.  Will watch your condition.  Will get help right away if you are not doing well or get worse. Document Released: 11/21/2010 Document Revised: 06/21/2013 Document Reviewed: 04/20/2013 [facility] Patient Information 2014 [place], [place].

## 2014-04-10 NOTE — H&P (Signed)
FMTS Attending Note  I personally saw and evaluated the patient. The plan of care was discussed with the resident team. I agree with the assessment and plan as documented by the resident.   53 year old female with past medical history of obesity and chronic back pain presents with chest pain. Patient states that she was packing yesterday and developed substernal chest pain, no radiation, she did have associated shortness of breath and nausea, no previous history of CAD, she admits that she was somewhat upset as she was packing for her daughter to move to [place], patient is currently asymptomatic, please refer to resident note for additional history of present illness  Vitals: Reviewed General: Pleasant Caucasian female, no acute distress HEENT: Normocephalic, pupils are equal in size bilaterally, extraocular movements are intact, as mucous members, uvula midline, neck supple, no anterior posterior cervical lymphadenopathy Cardiac: Regular rate and rhythm, S1 and S2 present, no murmurs, no heaves or thrills Respiratory: Clear to auscultation bilaterally, normal effort Abdomen: Soft, nontender, bowel sounds present Extremities: No edema, 2+ radial and dorsalis pedis pulses bilaterally  Reviewed lab work and imaging  Assessment and plan: 53 year old female admitted with chest pain 1. Atypical chest pain-cardiac workup negative at time of examination. Appreciate cardiology input. Complete cardiac workup and risk stratify  Dossie Arbour MD

## 2014-04-10 NOTE — Progress Notes (Signed)
Discussed in rounds with team and Dr. Raliegh Ip.  Agree with his management and documentation.  Dr. Ree Kida saw as attending today.

## 2014-04-10 NOTE — Progress Notes (Signed)
Lexiscan myoview completed without complications.  No chest pain.

## 2014-04-10 NOTE — Progress Notes (Signed)
Chest Pain Rounds: Patient seen on chest pain rounds. No further chest pain. Pain yesterday occurred in setting of physical and emotional stress. Pain was responsive to NTG.  EKG shows no ischemic changes. Troponins negative x2. Past history of normal stress test in [place] 3 or more years ago. Those records not available. Chest xray shows borderline cardiomegaly. Lungs clear. Heart: no murmur gallop or rub. Extremities: no phlebitis or edema. Plan: Will proceed with lexiscan myoview (bad knees, unlikely to be able to exercise.) Will also get 2D echo in view of borderline cardiomegaly.

## 2014-04-10 NOTE — Progress Notes (Signed)
Pt was given IV phenergan per MD order for nausea not relieved by Zofran given in ED. Pt now stating she "feels weird" "I just dont feel right".  Pt is very restless and fidgity--unable to sit still. She is rubbing her legs, kicking her feet, sitting up/lying down. VS stable (BP 111/72 HR 93 O2 sat 99% on RA). Pt is alert and oriented x4. MD on call paged and informed. MD spoke to pt via phone. Orders to be placed.  Will continue to monitor Jessie Foot, RN

## 2014-04-11 NOTE — Discharge Summary (Signed)
Agree with DC, documentation and management as outlined by Dr. Lonny Prude.  I would add that her myovue study was normal.

## 2014-04-13 ENCOUNTER — Ambulatory Visit: Payer: Self-pay | Admitting: Pain Medicine

## 2014-08-03 ENCOUNTER — Ambulatory Visit: Payer: Self-pay | Admitting: Pain Medicine

## 2014-09-07 DIAGNOSIS — R05 Cough: Secondary | ICD-10-CM | POA: Diagnosis not present

## 2014-09-07 DIAGNOSIS — J209 Acute bronchitis, unspecified: Secondary | ICD-10-CM | POA: Diagnosis not present

## 2015-01-17 DIAGNOSIS — L821 Other seborrheic keratosis: Secondary | ICD-10-CM | POA: Diagnosis not present

## 2015-01-17 DIAGNOSIS — D225 Melanocytic nevi of trunk: Secondary | ICD-10-CM | POA: Diagnosis not present

## 2015-01-17 DIAGNOSIS — D229 Melanocytic nevi, unspecified: Secondary | ICD-10-CM | POA: Diagnosis not present

## 2015-01-17 DIAGNOSIS — L308 Other specified dermatitis: Secondary | ICD-10-CM | POA: Diagnosis not present

## 2015-01-17 DIAGNOSIS — L918 Other hypertrophic disorders of the skin: Secondary | ICD-10-CM | POA: Diagnosis not present

## 2015-01-21 DIAGNOSIS — Z0000 Encounter for general adult medical examination without abnormal findings: Secondary | ICD-10-CM | POA: Diagnosis not present

## 2015-01-21 DIAGNOSIS — Z124 Encounter for screening for malignant neoplasm of cervix: Secondary | ICD-10-CM | POA: Diagnosis not present

## 2015-01-21 DIAGNOSIS — N926 Irregular menstruation, unspecified: Secondary | ICD-10-CM | POA: Diagnosis not present

## 2015-01-21 DIAGNOSIS — Z13220 Encounter for screening for lipoid disorders: Secondary | ICD-10-CM | POA: Diagnosis not present

## 2015-05-28 DIAGNOSIS — Z111 Encounter for screening for respiratory tuberculosis: Secondary | ICD-10-CM | POA: Diagnosis not present

## 2015-07-16 DIAGNOSIS — H547 Unspecified visual loss: Secondary | ICD-10-CM | POA: Diagnosis not present

## 2015-07-16 DIAGNOSIS — B9689 Other specified bacterial agents as the cause of diseases classified elsewhere: Secondary | ICD-10-CM | POA: Diagnosis not present

## 2015-07-16 DIAGNOSIS — J329 Chronic sinusitis, unspecified: Secondary | ICD-10-CM | POA: Diagnosis not present

## 2015-08-12 ENCOUNTER — Encounter: Payer: Medicaid Other | Admitting: Pain Medicine

## 2015-08-22 ENCOUNTER — Encounter: Payer: Self-pay | Admitting: Pain Medicine

## 2015-08-22 ENCOUNTER — Ambulatory Visit: Payer: Worker's Compensation | Attending: Pain Medicine | Admitting: Pain Medicine

## 2015-08-22 VITALS — BP 120/58 | HR 69 | Temp 98.0°F | Resp 18 | Ht 67.0 in | Wt 215.0 lb

## 2015-08-22 DIAGNOSIS — M549 Dorsalgia, unspecified: Secondary | ICD-10-CM | POA: Insufficient documentation

## 2015-08-22 DIAGNOSIS — D509 Iron deficiency anemia, unspecified: Secondary | ICD-10-CM | POA: Diagnosis not present

## 2015-08-22 DIAGNOSIS — M4316 Spondylolisthesis, lumbar region: Secondary | ICD-10-CM | POA: Diagnosis not present

## 2015-08-22 DIAGNOSIS — M5441 Lumbago with sciatica, right side: Secondary | ICD-10-CM

## 2015-08-22 DIAGNOSIS — F1190 Opioid use, unspecified, uncomplicated: Secondary | ICD-10-CM | POA: Diagnosis not present

## 2015-08-22 DIAGNOSIS — Z79891 Long term (current) use of opiate analgesic: Secondary | ICD-10-CM

## 2015-08-22 DIAGNOSIS — G479 Sleep disorder, unspecified: Secondary | ICD-10-CM

## 2015-08-22 DIAGNOSIS — M79604 Pain in right leg: Secondary | ICD-10-CM | POA: Diagnosis not present

## 2015-08-22 DIAGNOSIS — G8929 Other chronic pain: Secondary | ICD-10-CM | POA: Diagnosis not present

## 2015-08-22 DIAGNOSIS — M539 Dorsopathy, unspecified: Secondary | ICD-10-CM

## 2015-08-22 DIAGNOSIS — F1120 Opioid dependence, uncomplicated: Secondary | ICD-10-CM

## 2015-08-22 DIAGNOSIS — M5416 Radiculopathy, lumbar region: Secondary | ICD-10-CM

## 2015-08-22 DIAGNOSIS — M4726 Other spondylosis with radiculopathy, lumbar region: Secondary | ICD-10-CM | POA: Diagnosis not present

## 2015-08-22 DIAGNOSIS — Z5181 Encounter for therapeutic drug level monitoring: Secondary | ICD-10-CM

## 2015-08-22 DIAGNOSIS — G9619 Other disorders of meninges, not elsewhere classified: Secondary | ICD-10-CM

## 2015-08-22 DIAGNOSIS — Z79899 Other long term (current) drug therapy: Secondary | ICD-10-CM

## 2015-08-22 DIAGNOSIS — E559 Vitamin D deficiency, unspecified: Secondary | ICD-10-CM

## 2015-08-22 DIAGNOSIS — M545 Low back pain: Secondary | ICD-10-CM

## 2015-08-22 DIAGNOSIS — M961 Postlaminectomy syndrome, not elsewhere classified: Secondary | ICD-10-CM

## 2015-08-22 DIAGNOSIS — G4730 Sleep apnea, unspecified: Secondary | ICD-10-CM

## 2015-08-22 DIAGNOSIS — M5442 Lumbago with sciatica, left side: Secondary | ICD-10-CM

## 2015-08-22 DIAGNOSIS — G96198 Other disorders of meninges, not elsewhere classified: Secondary | ICD-10-CM

## 2015-08-22 DIAGNOSIS — E538 Deficiency of other specified B group vitamins: Secondary | ICD-10-CM | POA: Insufficient documentation

## 2015-08-22 DIAGNOSIS — M47816 Spondylosis without myelopathy or radiculopathy, lumbar region: Secondary | ICD-10-CM | POA: Insufficient documentation

## 2015-08-22 HISTORY — DX: Radiculopathy, lumbar region: M54.16

## 2015-08-22 MED ORDER — HYDROCODONE-ACETAMINOPHEN 10-325 MG PO TABS: 1.0000 | ORAL_TABLET | Freq: Two times a day (BID) | ORAL | Status: DC | PRN

## 2015-08-22 MED ORDER — MELOXICAM 15 MG PO TABS: 15.0000 mg | ORAL_TABLET | Freq: Every morning | ORAL | Status: DC

## 2015-08-22 MED ORDER — CYCLOBENZAPRINE HCL 10 MG PO TABS: 10.0000 mg | ORAL_TABLET | Freq: Every day | ORAL | Status: DC

## 2015-08-22 NOTE — Patient Instructions (Signed)
GENERAL RISKS AND COMPLICATIONS  What are the risk, side effects and possible complications? Generally speaking, most procedures are safe.  However, with any procedure there are risks, side effects, and the possibility of complications.  The risks and complications are dependent upon the sites that are lesioned, or the type of nerve block to be performed.  The closer the procedure is to the spine, the more serious the risks are.  Great care is taken when placing the radio frequency needles, block needles or lesioning probes, but sometimes complications can occur. 1. Infection: Any time there is an injection through the skin, there is a risk of infection.  This is why sterile conditions are used for these blocks.  There are four possible types of infection. 1. Localized skin infection. 2. Central Nervous System Infection-This can be in the form of Meningitis, which can be deadly. 3. Epidural Infections-This can be in the form of an epidural abscess, which can cause pressure inside of the spine, causing compression of the spinal cord with subsequent paralysis. This would require an emergency surgery to decompress, and there are no guarantees that the patient would recover from the paralysis. 4. Discitis-This is an infection of the intervertebral discs.  It occurs in about 1% of discography procedures.  It is difficult to treat and it may lead to surgery.        2. Pain: the needles have to go through skin and soft tissues, will cause soreness.       3. Damage to internal structures:  The nerves to be lesioned may be near blood vessels or    other nerves which can be potentially damaged.       4. Bleeding: Bleeding is more common if the patient is taking blood thinners such as  aspirin, Coumadin, Ticiid, Plavix, etc., or if he/she have some genetic predisposition  such as hemophilia. Bleeding into the spinal canal can cause compression of the spinal  cord with subsequent paralysis.  This would require an  emergency surgery to  decompress and there are no guarantees that the patient would recover from the  paralysis.       5. Pneumothorax:  Puncturing of a lung is a possibility, every time a needle is introduced in  the area of the chest or upper back.  Pneumothorax refers to free air around the  collapsed lung(s), inside of the thoracic cavity (chest cavity).  Another two possible  complications related to a similar event would include: Hemothorax and Chylothorax.   These are variations of the Pneumothorax, where instead of air around the collapsed  lung(s), you may have blood or chyle, respectively.       6. Spinal headaches: They may occur with any procedures in the area of the spine.       7. Persistent CSF (Cerebro-Spinal Fluid) leakage: This is a rare problem, but may occur  with prolonged intrathecal or epidural catheters either due to the formation of a fistulous  track or a dural tear.       8. Nerve damage: By working so close to the spinal cord, there is always a possibility of  nerve damage, which could be as serious as a permanent spinal cord injury with  paralysis.       9. Death:  Although rare, severe deadly allergic reactions known as "Anaphylactic  reaction" can occur to any of the medications used.      10. Worsening of the symptoms:  We can always make thing worse.    What are the chances of something like this happening? Chances of any of this occuring are extremely low.  By statistics, you have more of a chance of getting killed in a motor vehicle accident: while driving to the hospital than any of the above occurring .  Nevertheless, you should be aware that they are possibilities.  In general, it is similar to taking a shower.  Everybody knows that you can slip, hit your head and get killed.  Does that mean that you should not shower again?  Nevertheless always keep in mind that statistics do not mean anything if you happen to be on the wrong side of them.  Even if a procedure has a 1  (one) in a 1,000,000 (million) chance of going wrong, it you happen to be that one..Also, keep in mind that by statistics, you have more of a chance of having something go wrong when taking medications.  Who should not have this procedure? If you are on a blood thinning medication (e.g. Coumadin, Plavix, see list of "Blood Thinners"), or if you have an active infection going on, you should not have the procedure.  If you are taking any blood thinners, please inform your physician.  How should I prepare for this procedure?  Do not eat or drink anything at least six hours prior to the procedure.  Bring a driver with you .  It cannot be a taxi.  Come accompanied by an adult that can drive you back, and that is strong enough to help you if your legs get weak or numb from the local anesthetic.  Take all of your medicines the morning of the procedure with just enough water to swallow them.  If you have diabetes, make sure that you are scheduled to have your procedure done first thing in the morning, whenever possible.  If you have diabetes, take only half of your insulin dose and notify our nurse that you have done so as soon as you arrive at the clinic.  If you are diabetic, but only take blood sugar pills (oral hypoglycemic), then do not take them on the morning of your procedure.  You may take them after you have had the procedure.  Do not take aspirin or any aspirin-containing medications, at least eleven (11) days prior to the procedure.  They may prolong bleeding.  Wear loose fitting clothing that may be easy to take off and that you would not mind if it got stained with Betadine or blood.  Do not wear any jewelry or perfume  Remove any nail coloring.  It will interfere with some of our monitoring equipment.  NOTE: Remember that this is not meant to be interpreted as a complete list of all possible complications.  Unforeseen problems may occur.  BLOOD THINNERS The following drugs  contain aspirin or other products, which can cause increased bleeding during surgery and should not be taken for 2 weeks prior to and 1 week after surgery.  If you should need take something for relief of minor pain, you may take acetaminophen which is found in Tylenol,m Datril, Anacin-3 and Panadol. It is not blood thinner. The products listed below are.  Do not take any of the products listed below in addition to any listed on your instruction sheet.  A.P.C or A.P.C with Codeine Codeine Phosphate Capsules #3 Ibuprofen Ridaura  ABC compound Congesprin Imuran rimadil  Advil Cope Indocin Robaxisal  Alka-Seltzer Effervescent Pain Reliever and Antacid Coricidin or Coricidin-D  Indomethacin Rufen    Alka-Seltzer plus Cold Medicine Cosprin Ketoprofen S-A-C Tablets  Anacin Analgesic Tablets or Capsules Coumadin Korlgesic Salflex  Anacin Extra Strength Analgesic tablets or capsules CP-2 Tablets Lanoril Salicylate  Anaprox Cuprimine Capsules Levenox Salocol  Anexsia-D Dalteparin Magan Salsalate  Anodynos Darvon compound Magnesium Salicylate Sine-off  Ansaid Dasin Capsules Magsal Sodium Salicylate  Anturane Depen Capsules Marnal Soma  APF Arthritis pain formula Dewitt's Pills Measurin Stanback  Argesic Dia-Gesic Meclofenamic Sulfinpyrazone  Arthritis Bayer Timed Release Aspirin Diclofenac Meclomen Sulindac  Arthritis pain formula Anacin Dicumarol Medipren Supac  Analgesic (Safety coated) Arthralgen Diffunasal Mefanamic Suprofen  Arthritis Strength Bufferin Dihydrocodeine Mepro Compound Suprol  Arthropan liquid Dopirydamole Methcarbomol with Aspirin Synalgos  ASA tablets/Enseals Disalcid Micrainin Tagament  Ascriptin Doan's Midol Talwin  Ascriptin A/D Dolene Mobidin Tanderil  Ascriptin Extra Strength Dolobid Moblgesic Ticlid  Ascriptin with Codeine Doloprin or Doloprin with Codeine Momentum Tolectin  Asperbuf Duoprin Mono-gesic Trendar  Aspergum Duradyne Motrin or Motrin IB Triminicin  Aspirin  plain, buffered or enteric coated Durasal Myochrisine Trigesic  Aspirin Suppositories Easprin Nalfon Trillsate  Aspirin with Codeine Ecotrin Regular or Extra Strength Naprosyn Uracel  Atromid-S Efficin Naproxen Ursinus  Auranofin Capsules Elmiron Neocylate Vanquish  Axotal Emagrin Norgesic Verin  Azathioprine Empirin or Empirin with Codeine Normiflo Vitamin E  Azolid Emprazil Nuprin Voltaren  Bayer Aspirin plain, buffered or children's or timed BC Tablets or powders Encaprin Orgaran Warfarin Sodium  Buff-a-Comp Enoxaparin Orudis Zorpin  Buff-a-Comp with Codeine Equegesic Os-Cal-Gesic   Buffaprin Excedrin plain, buffered or Extra Strength Oxalid   Bufferin Arthritis Strength Feldene Oxphenbutazone   Bufferin plain or Extra Strength Feldene Capsules Oxycodone with Aspirin   Bufferin with Codeine Fenoprofen Fenoprofen Pabalate or Pabalate-SF   Buffets II Flogesic Panagesic   Buffinol plain or Extra Strength Florinal or Florinal with Codeine Panwarfarin   Buf-Tabs Flurbiprofen Penicillamine   Butalbital Compound Four-way cold tablets Penicillin   Butazolidin Fragmin Pepto-Bismol   Carbenicillin Geminisyn Percodan   Carna Arthritis Reliever Geopen Persantine   Carprofen Gold's salt Persistin   Chloramphenicol Goody's Phenylbutazone   Chloromycetin Haltrain Piroxlcam   Clmetidine heparin Plaquenil   Cllnoril Hyco-pap Ponstel   Clofibrate Hydroxy chloroquine Propoxyphen         Before stopping any of these medications, be sure to consult the physician who ordered them.  Some, such as Coumadin (Warfarin) are ordered to prevent or treat serious conditions such as "deep thrombosis", "pumonary embolisms", and other heart problems.  The amount of time that you may need off of the medication may also vary with the medication and the reason for which you were taking it.  If you are taking any of these medications, please make sure you notify your pain physician before you undergo any  procedures.         Epidural Steroid Injection Patient Information  Description: The epidural space surrounds the nerves as they exit the spinal cord.  In some patients, the nerves can be compressed and inflamed by a bulging disc or a tight spinal canal (spinal stenosis).  By injecting steroids into the epidural space, we can bring irritated nerves into direct contact with a potentially helpful medication.  These steroids act directly on the irritated nerves and can reduce swelling and inflammation which often leads to decreased pain.  Epidural steroids may be injected anywhere along the spine and from the neck to the low back depending upon the location of your pain.   After numbing the skin with local anesthetic (like Novocaine), a small needle is passed   into the epidural space slowly.  You may experience a sensation of pressure while this is being done.  The entire block usually last less than 10 minutes.  Conditions which may be treated by epidural steroids:   Low back and leg pain  Neck and arm pain  Spinal stenosis  Post-laminectomy syndrome  Herpes zoster (shingles) pain  Pain from compression fractures  Preparation for the injection:  1. Do not eat any solid food or dairy products within 6 hours of your appointment.  2. You may drink clear liquids up to 2 hours before appointment.  Clear liquids include water, black coffee, juice or soda.  No milk or cream please. 3. You may take your regular medication, including pain medications, with a sip of water before your appointment  Diabetics should hold regular insulin (if taken separately) and take 1/2 normal NPH dos the morning of the procedure.  Carry some sugar containing items with you to your appointment. 4. A driver must accompany you and be prepared to drive you home after your procedure.  5. Bring all your current medications with your. 6. An IV may be inserted and sedation may be given at the discretion of the  physician.   7. A blood pressure cuff, EKG and other monitors will often be applied during the procedure.  Some patients may need to have extra oxygen administered for a short period. 8. You will be asked to provide medical information, including your allergies, prior to the procedure.  We must know immediately if you are taking blood thinners (like Coumadin/Warfarin)  Or if you are allergic to IV iodine contrast (dye). We must know if you could possible be pregnant.  Possible side-effects:  Bleeding from needle site  Infection (rare, may require surgery)  Nerve injury (rare)  Numbness & tingling (temporary)  Difficulty urinating (rare, temporary)  Spinal headache ( a headache worse with upright posture)  Light -headedness (temporary)  Pain at injection site (several days)  Decreased blood pressure (temporary)  Weakness in arm/leg (temporary)  Pressure sensation in back/neck (temporary)  Call if you experience:  Fever/chills associated with headache or increased back/neck pain.  Headache worsened by an upright position.  New onset weakness or numbness of an extremity below the injection site  Hives or difficulty breathing (go to the emergency room)  Inflammation or drainage at the infection site  Severe back/neck pain  Any new symptoms which are concerning to you  Please note:  Although the local anesthetic injected can often make your back or neck feel good for several hours after the injection, the pain will likely return.  It takes 3-7 days for steroids to work in the epidural space.  You may not notice any pain relief for at least that one week.  If effective, we will often do a series of three injections spaced 3-6 weeks apart to maximally decrease your pain.  After the initial series, we generally will wait several months before considering a repeat injection of the same type.  If you have any questions, please call [phone] [facility] Patient Information  Description: The facets are joints in the spine between the vertebrae.  Like any joints in the body, facets can become irritated and painful.  Arthritis can also effect the facets.  By injecting steroids and local anesthetic in and around these joints, we can temporarily block the nerve supply to them.  Steroids act directly on irritated nerves and tissues to reduce  selling and inflammation which often leads to decreased pain.  Facet blocks may be done anywhere along the spine from the neck to the low back depending upon the location of your pain.   After numbing the skin with local anesthetic (like Novocaine), a small needle is passed onto the facet joints under x-ray guidance.  You may experience a sensation of pressure while this is being done.  The entire block usually lasts about 15-25 minutes.   Conditions which may be treated by facet blocks:   Low back/buttock pain  Neck/shoulder pain  Certain types of headaches  Preparation for the injection:  12. Do not eat any solid food or dairy products within 6 hours of your appointment. 13. You may drink clear liquid up to 2 hours before appointment.  Clear liquids include water, black coffee, juice or soda.  No milk or cream please. 14. You may take your regular medication, including pain medications, with a sip of water before your appointment.  Diabetics should hold regular insulin (if taken separately) and take 1/2 normal NPH dose the morning of the procedure.  Carry some sugar containing items with you to your appointment. 15. A driver must accompany you and be prepared to drive you home after your procedure. 33. Bring all your current medications with you. 17. An IV may be inserted and sedation may be given at the discretion of the physician. 18. A blood pressure cuff, EKG and other monitors will often be applied during the procedure.  Some patients may need to have extra oxygen  administered for a short period. 79. You will be asked to provide medical information, including your allergies and medications, prior to the procedure.  We must know immediately if you are taking blood thinners (like Coumadin/Warfarin) or if you are allergic to IV iodine contrast (dye).  We must know if you could possible be pregnant.  Possible side-effects:   Bleeding from needle site  Infection (rare, may require surgery)  Nerve injury (rare)  Numbness & tingling (temporary)  Difficulty urinating (rare, temporary)  Spinal headache (a headache worse with upright posture)  Light-headedness (temporary)  Pain at injection site (serveral days)  Decreased blood pressure (rare, temporary)  Weakness in arm/leg (temporary)  Pressure sensation in back/neck (temporary)   Call if you experience:   Fever/chills associated with headache or increased back/neck pain  Headache worsened by an upright position  New onset, weakness or numbness of an extremity below the injection site  Hives or difficulty breathing (go to the emergency room)  Inflammation or drainage at the injection site(s)  Severe back/neck pain greater than usual  New symptoms which are concerning to you  Please note:  Although the local anesthetic injected can often make your back or neck feel good for several hours after the injection, the pain will likely return. It takes 3-7 days for steroids to work.  You may not notice any pain relief for at least one week.  If effective, we will often do a series of 2-3 injections spaced 3-6 weeks apart to maximally decrease your pain.  After the initial series, you may be a candidate for a more permanent nerve block of the facets.  If you have any questions, please call #[phone]) [facility]

## 2015-08-22 NOTE — Progress Notes (Signed)
Safety precautions to be maintained throughout the outpatient stay will include: orient to surroundings, keep bed in low position, maintain call bell within reach at all times, provide assistance with transfer out of bed and ambulation.  

## 2015-08-23 DIAGNOSIS — G4730 Sleep apnea, unspecified: Secondary | ICD-10-CM | POA: Insufficient documentation

## 2015-08-23 DIAGNOSIS — G479 Sleep disorder, unspecified: Secondary | ICD-10-CM | POA: Insufficient documentation

## 2015-08-23 DIAGNOSIS — M47816 Spondylosis without myelopathy or radiculopathy, lumbar region: Secondary | ICD-10-CM | POA: Insufficient documentation

## 2015-08-23 DIAGNOSIS — G96198 Other disorders of meninges, not elsewhere classified: Secondary | ICD-10-CM | POA: Insufficient documentation

## 2015-08-23 DIAGNOSIS — G9619 Other disorders of meninges, not elsewhere classified: Secondary | ICD-10-CM

## 2015-08-23 DIAGNOSIS — M4316 Spondylolisthesis, lumbar region: Secondary | ICD-10-CM | POA: Insufficient documentation

## 2015-08-23 NOTE — Progress Notes (Signed)
Patient's Name: Stephanie [name] MRN: [id number] DOB: [birth year] DOS: 08/22/2015  Primary Reason(s) for Visit: Encounter for Medication Management. CC: Back Pain   HPI:   Stephanie [name] is a 54 y.o. year old, female patient, who returns today as an established patient. She has Atypical chest pain; Hypochromic microcytic anemia; Lumbar radicular pain; Right leg pain; Facet syndrome, lumbar; Chronic low back pain; Encounter for therapeutic drug level monitoring; Long term current use of opiate analgesic; Long term prescription opiate use; Uncomplicated opioid dependence ([place]); Opiate use; Chronic pain; Back pain, chronic; Chronic meniscal tear of knee; CD (contact dermatitis); Anemia, iron deficiency; Chest pain; Lumbar canal stenosis; B12 deficiency; Avitaminosis D; Lumbar spondylosis; Failed back surgical syndrome; Epidural fibrosis; Spondylolisthesis of lumbar region; Sleep apnea; Vitamin D deficiency; and Sleep disturbance on her problem list.. Her primarily concern today is the Back Pain    Pharmacotherapy Review: Side-effects or Adverse reactions: None reported. Effectiveness: Described as relatively effective, allowing for increase in activities of daily living (ADL). Onset of action: Within expected pharmacological parameters. Duration of action: Within normal limits for medication. Peak effect: Timing and results are as within normal expected parameters. [place] PMP: Compliant with practice rules and regulations. DST: Compliant with practice rules and regulations. Lab work: No new labs ordered by our practice. Treatment compliance: Compliant. Substance Use Disorder (SUD) Risk Level: Low Planned course of action: Continue therapy as is.  Allergies: Stephanie [name] is allergic to phenergan.  Meds: The patient has a current medication list which includes the following prescription(s): aspirin, vitamin d3, cyclobenzaprine, hydrocodone-acetaminophen, melatonin, meloxicam, trazodone,  triamcinolone ointment, vitamin d (ergocalciferol), hydrocodone-acetaminophen, and hydrocodone-acetaminophen. Requested Prescriptions   Signed Prescriptions Disp Refills  . HYDROcodone-acetaminophen (NORCO) 10-325 MG tablet 60 tablet 0    Sig: Take 1 tablet by mouth every 12 (twelve) hours as needed.  . meloxicam (MOBIC) 15 MG tablet 30 tablet 2    Sig: Take 1 tablet (15 mg total) by mouth every morning.  . cyclobenzaprine (FLEXERIL) 10 MG tablet 30 tablet 2    Sig: Take 1 tablet (10 mg total) by mouth at bedtime.  Marland Kitchen HYDROcodone-acetaminophen (NORCO) 10-325 MG tablet 60 tablet 0    Sig: Take 1 tablet by mouth every 12 (twelve) hours as needed for severe pain.  Marland Kitchen HYDROcodone-acetaminophen (NORCO) 10-325 MG tablet 60 tablet 0    Sig: Take 1 tablet by mouth every 12 (twelve) hours as needed for severe pain.    ROS: Constitutional: Afebrile, no chills, well hydrated and well nourished Gastrointestinal: negative Musculoskeletal:negative Neurological: negative Behavioral/Psych: negative  PFSH: Medical:  Stephanie [name]  has no past medical history on file. Family: family history is not on file. Surgical:  has past surgical history that includes Back surgery; Appendectomy; Cholecystectomy; Cesarean section; and Bunionectomy. Tobacco:  reports that she has never smoked. She does not have any smokeless tobacco history on file. Alcohol:  reports that she does not drink alcohol. Drug:  reports that she does not use illicit drugs.  Physical Exam: Vitals:  Today's Vitals   08/22/15 1355  BP: 120/58  Pulse: 69  Temp: 98 F (36.7 C)  TempSrc: Oral  Resp: 18  Height: 5\' 7"  (1.702 m)  Weight: 215 lb (97.523 kg)  SpO2: 98%  PainSc: 7   Calculated BMI: Body mass index is 33.67 kg/(m^2). General appearance: alert, cooperative, appears stated age, no distress and moderately obese Eyes: conjunctivae/corneas clear. PERRL, EOM's intact. Fundi benign. Lungs: No evidence respiratory distress, no  audible rales or ronchi and no  use of accessory muscles of respiration Neck: no adenopathy, no carotid bruit, no JVD, supple, symmetrical, trachea midline and thyroid not enlarged, symmetric, no tenderness/mass/nodules Back: symmetric, no curvature. ROM normal. No CVA tenderness. Extremities: extremities normal, atraumatic, no cyanosis or edema Pulses: 2+ and symmetric Skin: Skin color, texture, turgor normal. No rashes or lesions Neurologic: Grossly normal    Assessment: Encounter Diagnosis:  Primary Diagnosis: Chronic pain [G89.29]  Plan: Brandey was seen today for back pain.  Diagnoses and all orders for this visit:  Chronic pain -     HYDROcodone-acetaminophen (NORCO) 10-325 MG tablet; Take 1 tablet by mouth every 12 (twelve) hours as needed. -     HYDROcodone-acetaminophen (NORCO) 10-325 MG tablet; Take 1 tablet by mouth every 12 (twelve) hours as needed for severe pain. -     HYDROcodone-acetaminophen (NORCO) 10-325 MG tablet; Take 1 tablet by mouth every 12 (twelve) hours as needed for severe pain.  Opiate use  Uncomplicated opioid dependence ([place])  Long term prescription opiate use  Long term current use of opiate analgesic -     Drugs of abuse screen w/o alc, rtn urine-sln; Future  Encounter for therapeutic drug level monitoring  Chronic low back pain -     cyclobenzaprine (FLEXERIL) 10 MG tablet; Take 1 tablet (10 mg total) by mouth at bedtime.  Facet syndrome, lumbar  Right leg pain  Lumbar radicular pain  Osteoarthritis of spine with radiculopathy, lumbar region -     LUMBAR EPIDURAL STEROID INJECTION; Standing  Failed back surgical syndrome  Epidural fibrosis  Spondylolisthesis of lumbar region -     meloxicam (MOBIC) 15 MG tablet; Take 1 tablet (15 mg total) by mouth every morning.  Sleep apnea  Vitamin D deficiency  Sleep disturbance     Patient Instructions   GENERAL RISKS AND COMPLICATIONS  What are the risk, side effects and possible  complications? Generally speaking, most procedures are safe.  However, with any procedure there are risks, side effects, and the possibility of complications.  The risks and complications are dependent upon the sites that are lesioned, or the type of nerve block to be performed.  The closer the procedure is to the spine, the more serious the risks are.  Great care is taken when placing the radio frequency needles, block needles or lesioning probes, but sometimes complications can occur. 1. Infection: Any time there is an injection through the skin, there is a risk of infection.  This is why sterile conditions are used for these blocks.  There are four possible types of infection. 1. Localized skin infection. 2. Central Nervous System Infection-This can be in the form of Meningitis, which can be deadly. 3. Epidural Infections-This can be in the form of an epidural abscess, which can cause pressure inside of the spine, causing compression of the spinal cord with subsequent paralysis. This would require an emergency surgery to decompress, and there are no guarantees that the patient would recover from the paralysis. 4. Discitis-This is an infection of the intervertebral discs.  It occurs in about 1% of discography procedures.  It is difficult to treat and it may lead to surgery.        2. Pain: the needles have to go through skin and soft tissues, will cause soreness.       3. Damage to internal structures:  The nerves to be lesioned may be near blood vessels or    other nerves which can be potentially damaged.  4. Bleeding: Bleeding is more common if the patient is taking blood thinners such as  aspirin, Coumadin, Ticiid, Plavix, etc., or if he/she have some genetic predisposition  such as hemophilia. Bleeding into the spinal canal can cause compression of the spinal  cord with subsequent paralysis.  This would require an emergency surgery to  decompress and there are no guarantees that the patient  would recover from the  paralysis.       5. Pneumothorax:  Puncturing of a lung is a possibility, every time a needle is introduced in  the area of the chest or upper back.  Pneumothorax refers to free air around the  collapsed lung(s), inside of the thoracic cavity (chest cavity).  Another two possible  complications related to a similar event would include: Hemothorax and Chylothorax.   These are variations of the Pneumothorax, where instead of air around the collapsed  lung(s), you may have blood or chyle, respectively.       6. Spinal headaches: They may occur with any procedures in the area of the spine.       7. Persistent CSF (Cerebro-Spinal Fluid) leakage: This is a rare problem, but may occur  with prolonged intrathecal or epidural catheters either due to the formation of a fistulous  track or a dural tear.       8. Nerve damage: By working so close to the spinal cord, there is always a possibility of  nerve damage, which could be as serious as a permanent spinal cord injury with  paralysis.       9. Death:  Although rare, severe deadly allergic reactions known as "Anaphylactic  reaction" can occur to any of the medications used.      10. Worsening of the symptoms:  We can always make thing worse.  What are the chances of something like this happening? Chances of any of this occuring are extremely low.  By statistics, you have more of a chance of getting killed in a motor vehicle accident: while driving to the hospital than any of the above occurring .  Nevertheless, you should be aware that they are possibilities.  In general, it is similar to taking a shower.  Everybody knows that you can slip, hit your head and get killed.  Does that mean that you should not shower again?  Nevertheless always keep in mind that statistics do not mean anything if you happen to be on the wrong side of them.  Even if a procedure has a 1 (one) in a 1,000,000 (million) chance of going wrong, it you happen to be that  one..Also, keep in mind that by statistics, you have more of a chance of having something go wrong when taking medications.  Who should not have this procedure? If you are on a blood thinning medication (e.g. Coumadin, Plavix, see list of "Blood Thinners"), or if you have an active infection going on, you should not have the procedure.  If you are taking any blood thinners, please inform your physician.  How should I prepare for this procedure?  Do not eat or drink anything at least six hours prior to the procedure.  Bring a driver with you .  It cannot be a taxi.  Come accompanied by an adult that can drive you back, and that is strong enough to help you if your legs get weak or numb from the local anesthetic.  Take all of your medicines the morning of the procedure with just enough water  to swallow them.  If you have diabetes, make sure that you are scheduled to have your procedure done first thing in the morning, whenever possible.  If you have diabetes, take only half of your insulin dose and notify our nurse that you have done so as soon as you arrive at the clinic.  If you are diabetic, but only take blood sugar pills (oral hypoglycemic), then do not take them on the morning of your procedure.  You may take them after you have had the procedure.  Do not take aspirin or any aspirin-containing medications, at least eleven (11) days prior to the procedure.  They may prolong bleeding.  Wear loose fitting clothing that may be easy to take off and that you would not mind if it got stained with Betadine or blood.  Do not wear any jewelry or perfume  Remove any nail coloring.  It will interfere with some of our monitoring equipment.  NOTE: Remember that this is not meant to be interpreted as a complete list of all possible complications.  Unforeseen problems may occur.  BLOOD THINNERS The following drugs contain aspirin or other products, which can cause increased bleeding during  surgery and should not be taken for 2 weeks prior to and 1 week after surgery.  If you should need take something for relief of minor pain, you may take acetaminophen which is found in Tylenol,m Datril, Anacin-3 and Panadol. It is not blood thinner. The products listed below are.  Do not take any of the products listed below in addition to any listed on your instruction sheet.  A.P.C or A.P.C with Codeine Codeine Phosphate Capsules #3 Ibuprofen Ridaura  ABC compound Congesprin Imuran rimadil  Advil Cope Indocin Robaxisal  Alka-Seltzer Effervescent Pain Reliever and Antacid Coricidin or Coricidin-D  Indomethacin Rufen  Alka-Seltzer plus Cold Medicine Cosprin Ketoprofen S-A-C Tablets  Anacin Analgesic Tablets or Capsules Coumadin Korlgesic Salflex  Anacin Extra Strength Analgesic tablets or capsules CP-2 Tablets Lanoril Salicylate  Anaprox Cuprimine Capsules Levenox Salocol  Anexsia-D Dalteparin Magan Salsalate  Anodynos Darvon compound Magnesium Salicylate Sine-off  Ansaid Dasin Capsules Magsal Sodium Salicylate  Anturane Depen Capsules Marnal Soma  APF Arthritis pain formula Dewitt's Pills Measurin Stanback  Argesic Dia-Gesic Meclofenamic Sulfinpyrazone  Arthritis Bayer Timed Release Aspirin Diclofenac Meclomen Sulindac  Arthritis pain formula Anacin Dicumarol Medipren Supac  Analgesic (Safety coated) Arthralgen Diffunasal Mefanamic Suprofen  Arthritis Strength Bufferin Dihydrocodeine Mepro Compound Suprol  Arthropan liquid Dopirydamole Methcarbomol with Aspirin Synalgos  ASA tablets/Enseals Disalcid Micrainin Tagament  Ascriptin Doan's Midol Talwin  Ascriptin A/D Dolene Mobidin Tanderil  Ascriptin Extra Strength Dolobid Moblgesic Ticlid  Ascriptin with Codeine Doloprin or Doloprin with Codeine Momentum Tolectin  Asperbuf Duoprin Mono-gesic Trendar  Aspergum Duradyne Motrin or Motrin IB Triminicin  Aspirin plain, buffered or enteric coated Durasal Myochrisine Trigesic  Aspirin  Suppositories Easprin Nalfon Trillsate  Aspirin with Codeine Ecotrin Regular or Extra Strength Naprosyn Uracel  Atromid-S Efficin Naproxen Ursinus  Auranofin Capsules Elmiron Neocylate Vanquish  Axotal Emagrin Norgesic Verin  Azathioprine Empirin or Empirin with Codeine Normiflo Vitamin E  Azolid Emprazil Nuprin Voltaren  Bayer Aspirin plain, buffered or children's or timed BC Tablets or powders Encaprin Orgaran Warfarin Sodium  Buff-a-Comp Enoxaparin Orudis Zorpin  Buff-a-Comp with Codeine Equegesic Os-Cal-Gesic   Buffaprin Excedrin plain, buffered or Extra Strength Oxalid   Bufferin Arthritis Strength Feldene Oxphenbutazone   Bufferin plain or Extra Strength Feldene Capsules Oxycodone with Aspirin   Bufferin with Codeine Fenoprofen Fenoprofen Pabalate or Pabalate-SF  Buffets II Flogesic Panagesic   Buffinol plain or Extra Strength Florinal or Florinal with Codeine Panwarfarin   Buf-Tabs Flurbiprofen Penicillamine   Butalbital Compound Four-way cold tablets Penicillin   Butazolidin Fragmin Pepto-Bismol   Carbenicillin Geminisyn Percodan   Carna Arthritis Reliever Geopen Persantine   Carprofen Gold's salt Persistin   Chloramphenicol Goody's Phenylbutazone   Chloromycetin Haltrain Piroxlcam   Clmetidine heparin Plaquenil   Cllnoril Hyco-pap Ponstel   Clofibrate Hydroxy chloroquine Propoxyphen         Before stopping any of these medications, be sure to consult the physician who ordered them.  Some, such as Coumadin (Warfarin) are ordered to prevent or treat serious conditions such as "deep thrombosis", "pumonary embolisms", and other heart problems.  The amount of time that you may need off of the medication may also vary with the medication and the reason for which you were taking it.  If you are taking any of these medications, please make sure you notify your pain physician before you undergo any procedures.         Epidural Steroid Injection Patient  Information  Description: The epidural space surrounds the nerves as they exit the spinal cord.  In some patients, the nerves can be compressed and inflamed by a bulging disc or a tight spinal canal (spinal stenosis).  By injecting steroids into the epidural space, we can bring irritated nerves into direct contact with a potentially helpful medication.  These steroids act directly on the irritated nerves and can reduce swelling and inflammation which often leads to decreased pain.  Epidural steroids may be injected anywhere along the spine and from the neck to the low back depending upon the location of your pain.   After numbing the skin with local anesthetic (like Novocaine), a small needle is passed into the epidural space slowly.  You may experience a sensation of pressure while this is being done.  The entire block usually last less than 10 minutes.  Conditions which may be treated by epidural steroids:   Low back and leg pain  Neck and arm pain  Spinal stenosis  Post-laminectomy syndrome  Herpes zoster (shingles) pain  Pain from compression fractures  Preparation for the injection:  1. Do not eat any solid food or dairy products within 6 hours of your appointment.  2. You may drink clear liquids up to 2 hours before appointment.  Clear liquids include water, black coffee, juice or soda.  No milk or cream please. 3. You may take your regular medication, including pain medications, with a sip of water before your appointment  Diabetics should hold regular insulin (if taken separately) and take 1/2 normal NPH dos the morning of the procedure.  Carry some sugar containing items with you to your appointment. 4. A driver must accompany you and be prepared to drive you home after your procedure.  5. Bring all your current medications with your. 6. An IV may be inserted and sedation may be given at the discretion of the physician.   7. A blood pressure cuff, EKG and other monitors will  often be applied during the procedure.  Some patients may need to have extra oxygen administered for a short period. 8. You will be asked to provide medical information, including your allergies, prior to the procedure.  We must know immediately if you are taking blood thinners (like Coumadin/Warfarin)  Or if you are allergic to IV iodine contrast (dye). We must know if you could possible be pregnant.  Possible  side-effects:  Bleeding from needle site  Infection (rare, may require surgery)  Nerve injury (rare)  Numbness & tingling (temporary)  Difficulty urinating (rare, temporary)  Spinal headache ( a headache worse with upright posture)  Light -headedness (temporary)  Pain at injection site (several days)  Decreased blood pressure (temporary)  Weakness in arm/leg (temporary)  Pressure sensation in back/neck (temporary)  Call if you experience:  Fever/chills associated with headache or increased back/neck pain.  Headache worsened by an upright position.  New onset weakness or numbness of an extremity below the injection site  Hives or difficulty breathing (go to the emergency room)  Inflammation or drainage at the infection site  Severe back/neck pain  Any new symptoms which are concerning to you  Please note:  Although the local anesthetic injected can often make your back or neck feel good for several hours after the injection, the pain will likely return.  It takes 3-7 days for steroids to work in the epidural space.  You may not notice any pain relief for at least that one week.  If effective, we will often do a series of three injections spaced 3-6 weeks apart to maximally decrease your pain.  After the initial series, we generally will wait several months before considering a repeat injection of the same type.  If you have any questions, please call [phone] [facility] Patient Information  Description: The  facets are joints in the spine between the vertebrae.  Like any joints in the body, facets can become irritated and painful.  Arthritis can also effect the facets.  By injecting steroids and local anesthetic in and around these joints, we can temporarily block the nerve supply to them.  Steroids act directly on irritated nerves and tissues to reduce selling and inflammation which often leads to decreased pain.  Facet blocks may be done anywhere along the spine from the neck to the low back depending upon the location of your pain.   After numbing the skin with local anesthetic (like Novocaine), a small needle is passed onto the facet joints under x-ray guidance.  You may experience a sensation of pressure while this is being done.  The entire block usually lasts about 15-25 minutes.   Conditions which may be treated by facet blocks:   Low back/buttock pain  Neck/shoulder pain  Certain types of headaches  Preparation for the injection:  12. Do not eat any solid food or dairy products within 6 hours of your appointment. 13. You may drink clear liquid up to 2 hours before appointment.  Clear liquids include water, black coffee, juice or soda.  No milk or cream please. 14. You may take your regular medication, including pain medications, with a sip of water before your appointment.  Diabetics should hold regular insulin (if taken separately) and take 1/2 normal NPH dose the morning of the procedure.  Carry some sugar containing items with you to your appointment. 15. A driver must accompany you and be prepared to drive you home after your procedure. 46. Bring all your current medications with you. 17. An IV may be inserted and sedation may be given at the discretion of the physician. 18. A blood pressure cuff, EKG and other monitors will often be applied during the procedure.  Some patients may need to have extra oxygen administered for a short period. 3. You will be asked to provide medical  information, including your allergies and medications, prior to the procedure.  We must know immediately if you are taking blood thinners (like Coumadin/Warfarin) or if you are allergic to IV iodine contrast (dye).  We must know if you could possible be pregnant.  Possible side-effects:   Bleeding from needle site  Infection (rare, may require surgery)  Nerve injury (rare)  Numbness & tingling (temporary)  Difficulty urinating (rare, temporary)  Spinal headache (a headache worse with upright posture)  Light-headedness (temporary)  Pain at injection site (serveral days)  Decreased blood pressure (rare, temporary)  Weakness in arm/leg (temporary)  Pressure sensation in back/neck (temporary)   Call if you experience:   Fever/chills associated with headache or increased back/neck pain  Headache worsened by an upright position  New onset, weakness or numbness of an extremity below the injection site  Hives or difficulty breathing (go to the emergency room)  Inflammation or drainage at the injection site(s)  Severe back/neck pain greater than usual  New symptoms which are concerning to you  Please note:  Although the local anesthetic injected can often make your back or neck feel good for several hours after the injection, the pain will likely return. It takes 3-7 days for steroids to work.  You may not notice any pain relief for at least one week.  If effective, we will often do a series of 2-3 injections spaced 3-6 weeks apart to maximally decrease your pain.  After the initial series, you may be a candidate for a more permanent nerve block of the facets.  If you have any questions, please call #[phone]) [facility]   Medications discontinued today:  Medications Discontinued During This Encounter  Medication Reason  . Dextromethorphan-Guaifenesin (ROBITUSSIN DM PO) Error  . Vitamin D, Ergocalciferol, (DRISDOL) 50000 UNITS  CAPS capsule Error  . traZODone (DESYREL) 50 MG tablet Error  . meloxicam (MOBIC) 15 MG tablet Error  . cyclobenzaprine (FLEXERIL) 10 MG tablet Error  . Melatonin 10 MG CAPS Error  . HYDROcodone-acetaminophen (NORCO) 10-325 MG tablet Error  . HYDROcodone-acetaminophen (NORCO) 10-325 MG per tablet Reorder  . meloxicam (MOBIC) 15 MG tablet Reorder  . cyclobenzaprine (FLEXERIL) 10 MG tablet Reorder   Medications administered today:  Stephanie [name] had no medications administered during this visit.  Primary Care Physician: Chrisandra Carota, MD Location: [facility] Note by: Kathlen Brunswick. Dossie Arbour, M.D, DABA, DABAPM, DABPM, DABIPP, FIPP

## 2015-09-19 ENCOUNTER — Other Ambulatory Visit: Payer: Self-pay | Admitting: Pain Medicine

## 2015-09-24 ENCOUNTER — Telehealth: Payer: Self-pay | Admitting: Pain Medicine

## 2015-09-24 NOTE — Telephone Encounter (Signed)
trazadone and melatonin scripts were not included in scripts / please call these in

## 2015-10-08 NOTE — Telephone Encounter (Signed)
Please take care of this. Talk to me if you have any questions.

## 2015-10-09 ENCOUNTER — Telehealth: Payer: Self-pay

## 2015-10-09 ENCOUNTER — Other Ambulatory Visit: Payer: Self-pay

## 2015-10-09 MED ORDER — MELATONIN 10 MG PO CAPS: 10.0000 mg | ORAL_CAPSULE | Freq: Every day | ORAL | Status: DC

## 2015-10-09 MED ORDER — TRAZODONE HCL 50 MG PO TABS: 50.0000 mg | ORAL_TABLET | Freq: Every evening | ORAL | Status: DC | PRN

## 2015-10-09 NOTE — Telephone Encounter (Signed)
Prescriptions e scribed to pharmacy.  Patient notified.

## 2015-11-07 ENCOUNTER — Telehealth: Payer: Self-pay | Admitting: Pain Medicine

## 2015-11-07 NOTE — Telephone Encounter (Signed)
Trazadone will be authorized / please list in notes the symptoms for use in future notes/ [facility] / received request form [facility] for authorization for this med/

## 2015-11-18 ENCOUNTER — Encounter: Payer: Self-pay | Admitting: Pain Medicine

## 2015-11-18 ENCOUNTER — Other Ambulatory Visit: Payer: Self-pay | Admitting: Pain Medicine

## 2015-11-18 ENCOUNTER — Encounter (INDEPENDENT_AMBULATORY_CARE_PROVIDER_SITE_OTHER): Payer: Self-pay

## 2015-11-18 ENCOUNTER — Ambulatory Visit: Payer: Medicare Other | Attending: Pain Medicine | Admitting: Pain Medicine

## 2015-11-18 VITALS — BP 114/60 | HR 78 | Temp 98.4°F | Resp 16 | Ht 67.0 in | Wt 217.0 lb

## 2015-11-18 DIAGNOSIS — M454 Ankylosing spondylitis of thoracic region: Secondary | ICD-10-CM | POA: Insufficient documentation

## 2015-11-18 DIAGNOSIS — G479 Sleep disorder, unspecified: Secondary | ICD-10-CM

## 2015-11-18 DIAGNOSIS — Z79891 Long term (current) use of opiate analgesic: Secondary | ICD-10-CM | POA: Diagnosis not present

## 2015-11-18 DIAGNOSIS — D509 Iron deficiency anemia, unspecified: Secondary | ICD-10-CM | POA: Insufficient documentation

## 2015-11-18 DIAGNOSIS — M4806 Spinal stenosis, lumbar region: Secondary | ICD-10-CM | POA: Diagnosis not present

## 2015-11-18 DIAGNOSIS — F1190 Opioid use, unspecified, uncomplicated: Secondary | ICD-10-CM | POA: Diagnosis not present

## 2015-11-18 DIAGNOSIS — M549 Dorsalgia, unspecified: Secondary | ICD-10-CM | POA: Diagnosis present

## 2015-11-18 DIAGNOSIS — G4700 Insomnia, unspecified: Secondary | ICD-10-CM | POA: Diagnosis not present

## 2015-11-18 DIAGNOSIS — M539 Dorsopathy, unspecified: Secondary | ICD-10-CM

## 2015-11-18 DIAGNOSIS — R0789 Other chest pain: Secondary | ICD-10-CM | POA: Diagnosis not present

## 2015-11-18 DIAGNOSIS — E538 Deficiency of other specified B group vitamins: Secondary | ICD-10-CM | POA: Insufficient documentation

## 2015-11-18 DIAGNOSIS — E559 Vitamin D deficiency, unspecified: Secondary | ICD-10-CM

## 2015-11-18 DIAGNOSIS — G4701 Insomnia due to medical condition: Secondary | ICD-10-CM

## 2015-11-18 DIAGNOSIS — M961 Postlaminectomy syndrome, not elsewhere classified: Secondary | ICD-10-CM

## 2015-11-18 DIAGNOSIS — Z5181 Encounter for therapeutic drug level monitoring: Secondary | ICD-10-CM | POA: Diagnosis not present

## 2015-11-18 DIAGNOSIS — F1120 Opioid dependence, uncomplicated: Secondary | ICD-10-CM

## 2015-11-18 DIAGNOSIS — M4316 Spondylolisthesis, lumbar region: Secondary | ICD-10-CM | POA: Diagnosis not present

## 2015-11-18 DIAGNOSIS — G8929 Other chronic pain: Secondary | ICD-10-CM | POA: Insufficient documentation

## 2015-11-18 DIAGNOSIS — M545 Low back pain, unspecified: Secondary | ICD-10-CM

## 2015-11-18 LAB — C-REACTIVE PROTEIN: CRP: 0.7 mg/dL (ref <1.0)

## 2015-11-18 LAB — COMPREHENSIVE METABOLIC PANEL
ALBUMIN: 4 g/dL (ref 3.5–5.0)
ALK PHOS: 59 U/L (ref 38–126)
ALT: 14 U/L (ref 14–54)
ANION GAP: 6 (ref 5–15)
AST: 18 U/L (ref 15–41)
BUN: 9 mg/dL (ref 6–20)
CALCIUM: 9 mg/dL (ref 8.9–10.3)
CO2: 26 mmol/L (ref 22–32)
CREATININE: 0.59 mg/dL (ref 0.44–1.00)
Chloride: 109 mmol/L (ref 101–111)
GFR calc Af Amer: >60 mL/min (ref >60)
GFR calc non Af Amer: >60 mL/min (ref >60)
GLUCOSE: 94 mg/dL (ref 65–99)
Potassium: 4.2 mmol/L (ref 3.5–5.1)
SODIUM: 141 mmol/L (ref 135–145)
Total Bilirubin: 0.7 mg/dL (ref 0.3–1.2)
Total Protein: 7.1 g/dL (ref 6.5–8.1)

## 2015-11-18 LAB — MAGNESIUM: Magnesium: 2 mg/dL (ref 1.7–2.4)

## 2015-11-18 LAB — SEDIMENTATION RATE: SED RATE: 27 mm/h (ref 0–30)

## 2015-11-18 MED ORDER — HYDROCODONE-ACETAMINOPHEN 10-325 MG PO TABS: 1.0000 | ORAL_TABLET | Freq: Two times a day (BID) | ORAL | Status: DC | PRN

## 2015-11-18 MED ORDER — MELOXICAM 15 MG PO TABS: 15.0000 mg | ORAL_TABLET | Freq: Every morning | ORAL | Status: DC

## 2015-11-18 MED ORDER — CYCLOBENZAPRINE HCL 10 MG PO TABS: 10.0000 mg | ORAL_TABLET | Freq: Every day | ORAL | Status: DC

## 2015-11-18 MED ORDER — MELATONIN 10 MG PO CAPS: 10.0000 mg | ORAL_CAPSULE | Freq: Every day | ORAL | Status: DC

## 2015-11-18 MED ORDER — TRAZODONE HCL 50 MG PO TABS: 50.0000 mg | ORAL_TABLET | Freq: Every evening | ORAL | Status: DC | PRN

## 2015-11-18 NOTE — Progress Notes (Signed)
Patient's Name: Stephanie [name] MRN: [id number] DOB: May 09, 1961 DOS: 11/18/2015  Primary Reason(s) for Visit: Encounter for Medication Management CC: Back Pain   HPI:  [name] is a 55 y.o. year old, female patient, who returns today as an established patient. She has Atypical chest pain; Hypochromic microcytic anemia; Lumbar radicular pain; Right leg pain; Facet syndrome, lumbar; Chronic low back pain; Encounter for therapeutic drug level monitoring; Long term current use of opiate analgesic; Long term prescription opiate use; Uncomplicated opioid dependence ([place]); Opiate use; Chronic pain; Back pain, chronic; Chronic meniscal tear of knee; CD (contact dermatitis); Anemia, iron deficiency; Chest pain; Lumbar canal stenosis; B12 deficiency; Avitaminosis D; Lumbar spondylosis; Failed back surgical syndrome; Epidural fibrosis; Spondylolisthesis of lumbar region; Sleep apnea; Vitamin D deficiency; Sleep disturbance; and Insomnia secondary to chronic pain on her problem list.. Her primarily concern today is the Back Pain   The patient returns to the clinics today for pharmacological management of her chronic pain. She indicates that she has started taking trazodone 2 tablets at bedtime, contrary to how we had prescribed it, and has found out that when she does that she wakes up in the morning with a headache. There is very likely to be secondary to her sleep apnea.  Reported Pain Score: 7  (last pain med this a.m. @ 0830), clinically she looks like a 1-2/10. Reported level is inconsistent with clinical obrservations. Pain Type: Chronic pain Pain Location: Back Pain Orientation: Lower Pain Descriptors / Indicators: Burning, Constant, Tingling, Throbbing Pain Frequency: Constant  Date of Last Visit: 08/21/16 Service Provided on Last Visit: Med Refill  Pharmacotherapy  Review:   Onset of action: Slower than expected. Possible absorption problems. The patient indicates that it takes 1-2  hours for her to feel benefit. Time to Peak effect: Timing and results are as within normal expected parameters Effectiveness: Described as relatively effective but with some room for improvement % Relief: More than 50% Side-effects or Adverse reactions: None reported Duration of action: Within normal limits for medication  PMP: Compliant with practice rules and regulations UDS Results: Compliant UDS Interpretation: Patient appears to be compliant with practice rules and regulations Medication Assessment Form: Reviewed. Patient indicates being compliant with therapy Treatment compliance: Compliant Substance Use Disorder (SUD) Risk Level: Low Pharmacologic Plan: Continue therapy as is  Lab Work: Illicit Drugs No results found for: THCU, COCAINSCRNUR, PCPSCRNUR, MDMA, AMPHETMU, METHADONE, ETOH  Inflammation Markers Lab Results  Component Value Date   ESRSEDRATE 23 01/17/2014    Renal Function Lab Results  Component Value Date   BUN 11 04/09/2014   CREATININE 0.59 04/09/2014   GFRAA >90 04/09/2014   GFRNONAA >90 04/09/2014    Hepatic Function Lab Results  Component Value Date   AST 12 04/09/2014   ALT 9 04/09/2014   ALBUMIN 3.4* 04/09/2014    Electrolytes Lab Results  Component Value Date   NA 139 04/09/2014   K 4.0 04/09/2014   CL 103 04/09/2014   CALCIUM 9.0 04/09/2014   MG 1.9 01/17/2014    Allergies:  [name] is allergic to phenergan.  Meds:  The patient has a current medication list which includes the following prescription(s): vitamin d3, cyclobenzaprine, hydrocodone-acetaminophen, melatonin, meloxicam, hydrocodone-acetaminophen, hydrocodone-acetaminophen, trazodone, and triamcinolone ointment.  ROS:  Constitutional: Afebrile, no chills, well hydrated and well nourished Gastrointestinal: negative Musculoskeletal:negative Neurological: negative Behavioral/Psych: negative  PFSH:  Medical:  [name]  has no past medical history on  file. Family: family history is not on file. Surgical:  has past surgical history that includes Back surgery; Appendectomy; Cholecystectomy; Cesarean section; and Bunionectomy. Tobacco:  reports that she has never smoked. She does not have any smokeless tobacco history on file. Alcohol:  reports that she does not drink alcohol. Drug:  reports that she does not use illicit drugs.  Physical Exam:  Vitals:  Today's Vitals   11/18/15 1115  BP: 114/60  Pulse: 78  Temp: 98.4 F (36.9 C)  TempSrc: Oral  Resp: 16  Height: 5\' 7"  (1.702 m)  Weight: 217 lb (98.431 kg)  SpO2: 100%  PainSc: 7     Calculated BMI: Body mass index is 33.98 kg/(m^2).  General appearance: alert, cooperative, appears stated age, no distress and morbidly obese Eyes: PERLA Respiratory: No evidence respiratory distress, no audible rales or ronchi and no use of accessory muscles of respiration  Cervical Spine Inspection: Normal anatomy Alignment: Symetrical Palpation: WNL ROM: Adequate  Upper Extremities Inspection: No gross anomalies detected ROM: Adequate Sensory: Normal Motor: 5/5  Thoracic Spine Inspection: No gross anomalies detected Alignment: Symetrical Palpation: WNL ROM: Adequate  Lumbar Spine Inspection: No gross anomalies detected Alignment: Symetrical Palpation: WNL ROM: Decreased Gait: WNL  Lower Extremities Inspection: No gross anomalies detected ROM: Adequate Sensory: Normal Motor: 5/5  Assessment & Plan:  Primary Diagnosis & Pertinent Problem List: The primary encounter diagnosis was Chronic pain. Diagnoses of Long term current use of opiate analgesic, Encounter for therapeutic drug level monitoring, Uncomplicated opioid dependence ([place]), Failed back surgical syndrome, Spondylolisthesis of lumbar region, Chronic low back pain, Sleep disturbance, and Insomnia secondary to chronic pain were also pertinent to this visit.  Assessment: No problem-specific assessment & plan notes  found for this encounter.   Pharmacotherapy Orders: Meds ordered this encounter  Medications  . HYDROcodone-acetaminophen (NORCO) 10-325 MG tablet    Sig: Take 1 tablet by mouth every 12 (twelve) hours as needed for moderate pain or severe pain.    Dispense:  60 tablet    Refill:  0    Do not place this medication, or any other prescription from our practice, on "Automatic Refill". Patient may have prescription filled one day early if pharmacy is closed on scheduled refill date. Do not fill until: 11/24/15 To last until: 12/24/15  . HYDROcodone-acetaminophen (NORCO) 10-325 MG tablet    Sig: Take 1 tablet by mouth every 12 (twelve) hours as needed for moderate pain or severe pain.    Dispense:  60 tablet    Refill:  0    Do not place this medication, or any other prescription from our practice, on "Automatic Refill". Patient may have prescription filled one day early if pharmacy is closed on scheduled refill date. Do not fill until: 12/24/15 To last until: 01/20/16  . HYDROcodone-acetaminophen (NORCO) 10-325 MG tablet    Sig: Take 1 tablet by mouth every 12 (twelve) hours as needed for moderate pain or severe pain.    Dispense:  60 tablet    Refill:  0    Do not place this medication, or any other prescription from our practice, on "Automatic Refill". Patient may have prescription filled one day early if pharmacy is closed on scheduled refill date. Do not fill until: 01/20/16 To last until: 02/19/16  . meloxicam (MOBIC) 15 MG tablet    Sig: Take 1 tablet (15 mg total) by mouth every morning.    Dispense:  30 tablet    Refill:  2    Do not place this medication, or any other prescription from our  practice, on "Automatic Refill". Patient may have prescription filled one day early if pharmacy is closed on scheduled refill date.  . cyclobenzaprine (FLEXERIL) 10 MG tablet    Sig: Take 1 tablet (10 mg total) by mouth at bedtime.    Dispense:  30 tablet    Refill:  2    Do not place this  medication, or any other prescription from our practice, on "Automatic Refill". Patient may have prescription filled one day early if pharmacy is closed on scheduled refill date.  . Melatonin 10 MG CAPS    Sig: Take 10-20 mg by mouth at bedtime.    Dispense:  60 capsule    Refill:  2    Do not place this medication, or any other prescription from our practice, on "Automatic Refill". Patient may have prescription filled one day early if pharmacy is closed on scheduled refill date.  . traZODone (DESYREL) 50 MG tablet    Sig: Take 1-2 tablets (50-100 mg total) by mouth at bedtime as needed for sleep.    Dispense:  60 tablet    Refill:  2    Do not place this medication, or any other prescription from our practice, on "Automatic Refill". Patient may have prescription filled one day early if pharmacy is closed on scheduled refill date.    Lab-work & Procedure Orders: No orders of the defined types were placed in this encounter.    Radiology Orders: None  Interventional Therapies: None at this point.    Administered Medications: [name] had no medications administered during this visit.  Primary Care Physician: Chrisandra Carota, MD Location: [facility] Note by: Kathlen Brunswick. Dossie Arbour, M.D, DABA, DABAPM, DABPM, DABIPP, FIPP

## 2015-11-18 NOTE — Progress Notes (Signed)
Patient here today for medication refill.  States that Desyryl  Was helping her sleep when she began taking but began causing her headaches and bad dreams.   Hydrocodone qty 9  Last fill 10/22/2015

## 2015-11-23 LAB — TOXASSURE SELECT 13 (MW), URINE: PDF: 0

## 2015-12-07 ENCOUNTER — Other Ambulatory Visit: Payer: Self-pay | Admitting: Pain Medicine

## 2015-12-13 ENCOUNTER — Ambulatory Visit
Admission: EM | Admit: 2015-12-13 | Discharge: 2015-12-13 | Disposition: A | Discharge status: Home / Self Care | Payer: Medicare Other | Attending: Family Medicine | Admitting: Family Medicine

## 2015-12-13 DIAGNOSIS — J069 Acute upper respiratory infection, unspecified: Secondary | ICD-10-CM

## 2015-12-13 MED ORDER — HYDROCOD POLST-CPM POLST ER 10-8 MG/5ML PO SUER: 5.0000 mL | Freq: Two times a day (BID) | ORAL | Status: DC

## 2015-12-13 MED ORDER — PREDNISONE 20 MG PO TABS: ORAL_TABLET | ORAL | Status: DC

## 2015-12-13 NOTE — ED Notes (Signed)
Patient c/o of headache, nasal congestion, vomiting, and chills which started yesterday.  Denies fever.  Patient also states that she took 1 tab of Norco 10-325mg  this afternoon.

## 2015-12-13 NOTE — ED Provider Notes (Signed)
CSN: [id number]     Arrival date & time 12/13/15  1722 History   First MD Initiated Contact with Patient 12/13/15 1846     Chief Complaint  Patient presents with  . URI   (Consider location/radiation/quality/duration/timing/severity/associated sxs/prior Treatment) HPI   A 55 year old female who presents with the sudden onset yesterday take nasal congestion chills and persistent cough is caused her to vomit. Is not been having a fever but had some chills. Asking for a shot to make her feel better scribed Rocephin shot she is seeking.  History reviewed. No pertinent past medical history. Past Surgical History  Procedure Laterality Date  . Back surgery    . Appendectomy    . Cholecystectomy    . Cesarean section      X 2  . Bunionectomy      BOTH FEET   History reviewed. No pertinent family history. Social History  Substance Use Topics  . Smoking status: Never Smoker   . Smokeless tobacco: Never Used  . Alcohol Use: No   OB History    No data available     Review of Systems  Constitutional: Positive for chills and activity change. Negative for fever and fatigue.  HENT: Positive for congestion, postnasal drip, rhinorrhea and sinus pressure.   Respiratory: Positive for cough.   All other systems reviewed and are negative.   Allergies  Phenergan  Home Medications   Prior to Admission medications   Medication Sig Start Date End Date Taking? Authorizing Provider  Cholecalciferol (VITAMIN D3) 5000 UNITS CAPS Take 5,000 Units by mouth daily. Reported on 11/18/2015   Yes Historical Provider, MD  HYDROcodone-acetaminophen (NORCO) 10-325 MG tablet Take 1 tablet by mouth every 12 (twelve) hours as needed for moderate pain or severe pain. 11/18/15  Yes Milinda Pointer, MD  chlorpheniramine-HYDROcodone (TUSSIONEX PENNKINETIC ER) 10-8 MG/5ML SUER Take 5 mLs by mouth 2 (two) times daily. 12/13/15   Lorin Picket, PA-C  cyclobenzaprine (FLEXERIL) 10 MG tablet Take 1 tablet (10 mg  total) by mouth at bedtime. 11/18/15   Milinda Pointer, MD  HYDROcodone-acetaminophen (NORCO) 10-325 MG tablet Take 1 tablet by mouth every 12 (twelve) hours as needed for moderate pain or severe pain. 11/18/15   Milinda Pointer, MD  HYDROcodone-acetaminophen (NORCO) 10-325 MG tablet Take 1 tablet by mouth every 12 (twelve) hours as needed for moderate pain or severe pain. 11/18/15   Milinda Pointer, MD  Melatonin 10 MG CAPS Take 10-20 mg by mouth at bedtime. 11/18/15   Milinda Pointer, MD  meloxicam (MOBIC) 15 MG tablet Take 1 tablet (15 mg total) by mouth every morning. 11/18/15   Milinda Pointer, MD  predniSONE (DELTASONE) 20 MG tablet Take 2 tablets (40 mg) daily by mouth 12/13/15   Lorin Picket, PA-C  traZODone (DESYREL) 50 MG tablet Take 1-2 tablets (50-100 mg total) by mouth at bedtime as needed for sleep. 11/18/15   Milinda Pointer, MD  triamcinolone ointment (KENALOG) 0.1 % Apply topically. 01/17/15 01/17/16  Historical Provider, MD   Meds Ordered and Administered this Visit  Medications - No data to display  BP 142/72 mmHg  Pulse 80  Temp(Src) 98.1 F (36.7 C) (Oral)  Resp 18  Ht 5\' 7"  (1.702 m)  Wt 217 lb (98.431 kg)  BMI 33.98 kg/m2  SpO2 97%  LMP 07/04/2015 No data found.   Physical Exam  Constitutional: She is oriented to person, place, and time. She appears well-developed and well-nourished. No distress.  HENT:  Head: Normocephalic and atraumatic.  Right Ear: External ear normal.  Left Ear: External ear normal.  Nose: Nose normal.  Mouth/Throat: Oropharynx is clear and moist.  Eyes: Conjunctivae are normal. Pupils are equal, round, and reactive to light. Right eye exhibits no discharge. Left eye exhibits no discharge.  Neck: Normal range of motion. Neck supple.  Pulmonary/Chest: Effort normal and breath sounds normal. No respiratory distress. She has no wheezes. She has no rales.  Musculoskeletal: Normal range of motion. She exhibits no edema or tenderness.   Neurological: She is alert and oriented to person, place, and time.  Skin: Skin is warm and dry. She is not diaphoretic.  Psychiatric: She has a normal mood and affect. Her behavior is normal. Judgment and thought content normal.  Nursing note and vitals reviewed.   ED Course  Procedures (including critical care time)  Labs Review Labs Reviewed - No data to display  Imaging Review No results found.   Visual Acuity Review  Right Eye Distance:   Left Eye Distance:   Bilateral Distance:    Right Eye Near:   Left Eye Near:    Bilateral Near:         MDM   1. Acute URI    Discharge Medication List as of 12/13/2015  7:02 PM    START taking these medications   Details  chlorpheniramine-HYDROcodone (TUSSIONEX PENNKINETIC ER) 10-8 MG/5ML SUER Take 5 mLs by mouth 2 (two) times daily., Starting 12/13/2015, Until Discontinued, Print    predniSONE (DELTASONE) 20 MG tablet Take 2 tablets (40 mg) daily by mouth, Print      Plan: 1.Diagnosis reviewed with patient 2. rx as per orders; risks, benefits, potential side effects reviewed with patient 3. Recommend supportive treatment with fluids and rest. I have advised the patient that this is most likely a viral process. He does not require antibiotics as early in her symptoms and her school exam does not justify it. I've told her that it shot is deathly not indicated. I will provide her with some Tussionex for nighttime use but have reminded her that she is already taking hydrocodone for moderate pain and must not intermixed both of them. I also given her prescription for prednisone to see if I can help with the cough reflex and hopefully she will not require the use of the Tussionex except absolute necessary at nighttime and not concurrently with her other pain medication. Should follow-up with her primary care physician if she continues to have problems. 4. F/u prn if symptoms worsen or don't improve     Lorin Picket,  PA-C 12/13/15 1912

## 2015-12-13 NOTE — Discharge Instructions (Signed)
Cool Mist Vaporizers Vaporizers may help relieve the symptoms of a cough and cold. They add moisture to the air, which helps mucus to become thinner and less sticky. This makes it easier to breathe and cough up secretions. Cool mist vaporizers do not cause serious burns like hot mist vaporizers, which may also be called steamers or humidifiers. Vaporizers have not been proven to help with colds. You should not use a vaporizer if you are allergic to mold. HOME CARE INSTRUCTIONS  Follow the package instructions for the vaporizer.  Do not use anything other than distilled water in the vaporizer.  Do not run the vaporizer all of the time. This can cause mold or bacteria to grow in the vaporizer.  Clean the vaporizer after each time it is used.  Clean and dry the vaporizer well before storing it.  Stop using the vaporizer if worsening respiratory symptoms develop.   This information is not intended to replace advice given to you by your health care provider. Make sure you discuss any questions you have with your health care provider.   Document Released: 07/16/2004 Document Revised: 10/24/2013 Document Reviewed: 03/08/2013 Elsevier Interactive Patient Education 2016 [employer]  Cough, Adult Coughing is a reflex that clears your throat and your airways. Coughing helps to heal and protect your lungs. It is normal to cough occasionally, but a cough that happens with other symptoms or lasts a long time may be a sign of a condition that needs treatment. A cough may last only 2-3 weeks (acute), or it may last longer than 8 weeks (chronic). CAUSES Coughing is commonly caused by:  Breathing in substances that irritate your lungs.  A viral or bacterial respiratory infection.  Allergies.  Asthma.  Postnasal drip.  Smoking.  Acid backing up from the stomach into the esophagus (gastroesophageal reflux).  Certain medicines.  Chronic lung problems, including COPD (or rarely, lung  cancer).  Other medical conditions such as heart failure. HOME CARE INSTRUCTIONS  Pay attention to any changes in your symptoms. Take these actions to help with your discomfort:  Take medicines only as told by your health care provider.  If you were prescribed an antibiotic medicine, take it as told by your health care provider. Do not stop taking the antibiotic even if you start to feel better.  Talk with your health care provider before you take a cough suppressant medicine.  Drink enough fluid to keep your urine clear or pale yellow.  If the air is dry, use a cold steam vaporizer or humidifier in your bedroom or your home to help loosen secretions.  Avoid anything that causes you to cough at work or at home.  If your cough is worse at night, try sleeping in a semi-upright position.  Avoid cigarette smoke. If you smoke, quit smoking. If you need help quitting, ask your health care provider.  Avoid caffeine.  Avoid alcohol.  Rest as needed. SEEK MEDICAL CARE IF:   You have new symptoms.  You cough up pus.  Your cough does not get better after 2-3 weeks, or your cough gets worse.  You cannot control your cough with suppressant medicines and you are losing sleep.  You develop pain that is getting worse or pain that is not controlled with pain medicines.  You have a fever.  You have unexplained weight loss.  You have night sweats. SEEK IMMEDIATE MEDICAL CARE IF:  You cough up blood.  You have difficulty breathing.  Your heartbeat is very fast.  This information is not intended to replace advice given to you by your health care provider. Make sure you discuss any questions you have with your health care provider.   Document Released: 04/17/2011 Document Revised: 07/10/2015 Document Reviewed: 12/26/2014 Elsevier Interactive Patient Education [employer].

## 2015-12-18 DIAGNOSIS — R05 Cough: Secondary | ICD-10-CM | POA: Diagnosis not present

## 2016-02-11 ENCOUNTER — Other Ambulatory Visit: Payer: Self-pay | Admitting: Pain Medicine

## 2016-02-12 ENCOUNTER — Other Ambulatory Visit: Payer: Self-pay | Admitting: Pain Medicine

## 2016-02-12 ENCOUNTER — Ambulatory Visit: Payer: Medicare Other | Attending: Pain Medicine | Admitting: Pain Medicine

## 2016-02-12 ENCOUNTER — Encounter: Payer: Self-pay | Admitting: Pain Medicine

## 2016-02-12 VITALS — BP 130/76 | HR 85 | Temp 98.4°F | Resp 16 | Ht 67.0 in | Wt 217.0 lb

## 2016-02-12 DIAGNOSIS — M47816 Spondylosis without myelopathy or radiculopathy, lumbar region: Secondary | ICD-10-CM | POA: Diagnosis not present

## 2016-02-12 DIAGNOSIS — G4700 Insomnia, unspecified: Secondary | ICD-10-CM | POA: Diagnosis not present

## 2016-02-12 DIAGNOSIS — M79605 Pain in left leg: Secondary | ICD-10-CM

## 2016-02-12 DIAGNOSIS — L259 Unspecified contact dermatitis, unspecified cause: Secondary | ICD-10-CM | POA: Diagnosis not present

## 2016-02-12 DIAGNOSIS — D509 Iron deficiency anemia, unspecified: Secondary | ICD-10-CM | POA: Diagnosis not present

## 2016-02-12 DIAGNOSIS — M25562 Pain in left knee: Secondary | ICD-10-CM | POA: Diagnosis not present

## 2016-02-12 DIAGNOSIS — Z5181 Encounter for therapeutic drug level monitoring: Secondary | ICD-10-CM | POA: Diagnosis not present

## 2016-02-12 DIAGNOSIS — Z79891 Long term (current) use of opiate analgesic: Secondary | ICD-10-CM | POA: Diagnosis not present

## 2016-02-12 DIAGNOSIS — M4316 Spondylolisthesis, lumbar region: Secondary | ICD-10-CM | POA: Insufficient documentation

## 2016-02-12 DIAGNOSIS — G4701 Insomnia due to medical condition: Secondary | ICD-10-CM

## 2016-02-12 DIAGNOSIS — M549 Dorsalgia, unspecified: Secondary | ICD-10-CM | POA: Diagnosis present

## 2016-02-12 DIAGNOSIS — M25561 Pain in right knee: Secondary | ICD-10-CM | POA: Diagnosis not present

## 2016-02-12 DIAGNOSIS — G9619 Other disorders of meninges, not elsewhere classified: Secondary | ICD-10-CM | POA: Insufficient documentation

## 2016-02-12 DIAGNOSIS — G4730 Sleep apnea, unspecified: Secondary | ICD-10-CM | POA: Diagnosis not present

## 2016-02-12 DIAGNOSIS — G8929 Other chronic pain: Secondary | ICD-10-CM | POA: Diagnosis not present

## 2016-02-12 DIAGNOSIS — M79671 Pain in right foot: Secondary | ICD-10-CM | POA: Diagnosis not present

## 2016-02-12 DIAGNOSIS — X58XXXA Exposure to other specified factors, initial encounter: Secondary | ICD-10-CM | POA: Diagnosis not present

## 2016-02-12 DIAGNOSIS — E559 Vitamin D deficiency, unspecified: Secondary | ICD-10-CM | POA: Insufficient documentation

## 2016-02-12 DIAGNOSIS — S83206A Unspecified tear of unspecified meniscus, current injury, right knee, initial encounter: Secondary | ICD-10-CM | POA: Insufficient documentation

## 2016-02-12 DIAGNOSIS — R0789 Other chest pain: Secondary | ICD-10-CM | POA: Insufficient documentation

## 2016-02-12 DIAGNOSIS — M48061 Spinal stenosis, lumbar region without neurogenic claudication: Secondary | ICD-10-CM

## 2016-02-12 DIAGNOSIS — Q762 Congenital spondylolisthesis: Secondary | ICD-10-CM

## 2016-02-12 DIAGNOSIS — D508 Other iron deficiency anemias: Secondary | ICD-10-CM | POA: Insufficient documentation

## 2016-02-12 DIAGNOSIS — E538 Deficiency of other specified B group vitamins: Secondary | ICD-10-CM | POA: Diagnosis not present

## 2016-02-12 DIAGNOSIS — M25569 Pain in unspecified knee: Secondary | ICD-10-CM | POA: Diagnosis present

## 2016-02-12 DIAGNOSIS — M79604 Pain in right leg: Secondary | ICD-10-CM

## 2016-02-12 DIAGNOSIS — M5416 Radiculopathy, lumbar region: Secondary | ICD-10-CM

## 2016-02-12 DIAGNOSIS — M4806 Spinal stenosis, lumbar region: Secondary | ICD-10-CM | POA: Insufficient documentation

## 2016-02-12 DIAGNOSIS — M4310 Spondylolisthesis, site unspecified: Secondary | ICD-10-CM

## 2016-02-12 DIAGNOSIS — M545 Low back pain: Secondary | ICD-10-CM | POA: Diagnosis not present

## 2016-02-12 DIAGNOSIS — M79606 Pain in leg, unspecified: Secondary | ICD-10-CM | POA: Diagnosis not present

## 2016-02-12 MED ORDER — TRAZODONE HCL 50 MG PO TABS: 50.0000 mg | ORAL_TABLET | Freq: Every evening | ORAL | Status: DC | PRN

## 2016-02-12 MED ORDER — HYDROCODONE-ACETAMINOPHEN 10-325 MG PO TABS: 1.0000 | ORAL_TABLET | Freq: Two times a day (BID) | ORAL | Status: DC | PRN

## 2016-02-12 MED ORDER — MELOXICAM 15 MG PO TABS: 15.0000 mg | ORAL_TABLET | Freq: Every morning | ORAL | Status: DC

## 2016-02-12 MED ORDER — MELATONIN 10 MG PO CAPS: 10.0000 mg | ORAL_CAPSULE | Freq: Every day | ORAL | Status: DC

## 2016-02-12 NOTE — Progress Notes (Signed)
Patient's Name: Stephanie [name]  Patient type: Established  MRN: [id number]  Service setting: Ambulatory outpatient  DOB: 1961/01/13  Location: [facility]  DOS: 02/12/2016  Primary Care Physician: Chrisandra Carota, MD  Note by: Kathlen Brunswick. Dossie Arbour, M.D, DABA, DABAPM, DABPM, DABIPP, FIPP  Referring Physician: Chrisandra Carota, MD  Specialty: Board-Certified Interventional Pain Management     Primary Reason(s) for Visit: Encounter for prescription drug management (Level of risk: moderate) CC: Back Pain and Knee Pain   HPI  [name] is a 55 y.o. year old, female patient, who returns today as an established patient. 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 Her primarily concern today is the Back Pain and Knee Pain   Pain Assessment: Self-Reported Pain Score: 7 , clinically the patient looks  like a 2-3/10. Reported level is inconsistent with clinical obrservations Pain Type: Chronic pain Pain Descriptors / Indicators: Dull, Burning (stinging) Pain Frequency: Constant  The patient comes into clinic today for pharmacological management of her chronic pain. The patient seems to be doing well on her medication management, but has been having more low back pain and would like to have some nerve blocks done to tone this pain down.  According to the patient the primary source of pain is the lower back with the left side being worst on the right. Following this is the lower extremity pain, again with the left being worst on the right. In the case of the right lower extremity to the pain goes all the way down to the bottom of the foot in what seems to be an S1 dermatomal distribution. In the case of the left lower extremity the pain goes down to mid calf through the posterior aspect of the leg, strongly suggesting referred pain from the lower back. The patient's third source of pain is that of the knees with the right being worst on the left. According to the patient she has a history of a right knee meniscal tear. The patient's last described pain is that of the right foot, described to be over the bottom of the foot and likely to be part of a right lumbar radiculitis.  Since the patient's primary area of pain is the lower back and because today's physical exam was positive for exact reproduction of the usual pain upon hyperextension or rotation maneuver, we have decided to schedule a diagnostic bilateral lumbar facet block under fluoroscopic guidance and IV sedation.   Date of Last Visit: 11/18/15   Controlled Substance Pharmacotherapy Assessment  Analgesic: Hydrocodone/APAP 10/325 one every 12 hours (20 mg/day) Pill Count:  Hydrocodone 10/325 #5 out of 60 remaining. Filled on 01-14-16 MME/day: 20 mg/day.  Pharmacokinetics: Onset of action (Liberation/Absorption): Within expected  pharmacological parameters Time to Peak effect (Distribution): Timing and results are as within normal expected parameters Duration of action (Metabolism/Excretion): Within normal limits for medication Pharmacodynamics: Analgesic Effect: More than 50% Activity Facilitation: Medication(s) allow patient to sit, stand, walk, and do the basic ADLs Perceived Effectiveness: Described as relatively effective, allowing for increase in activities of daily living (ADL) Side-effects or Adverse reactions: None reported Monitoring: [place] PMP: Online review of the past 47-month period conducted. Compliant with practice rules and regulations UDS Results/interpretation: The patient's last UDS was done on 11/18/2015 and it came back within normal limits with no unexpected results. Medication Assessment Form: Reviewed. Patient indicates being compliant with therapy Treatment compliance: Compliant Risk Assessment: Aberrant Behavior: None observed today Substance Use Disorder (SUD) Risk Level: Low Risk of opioid abuse or dependence: 0.7-3.0% with doses ? 36 MME/day and 6.1-26% with doses ? 120 MME/day. Opioid Risk Tool (ORT) Score: Total Score: 0 Low Risk for SUD (Score <3) Depression Scale Score: PHQ-2: PHQ-2 Total Score: 0 No depression (0) PHQ-9: PHQ-9 Total Score: 0 No depression (0-4)  Pharmacologic Plan: No change in therapy, at this time  Laboratory Chemistry  Inflammation Markers Lab Results  Component Value Date   ESRSEDRATE 27 11/18/2015   CRP 0.7 11/18/2015    Renal Function Lab Results  Component Value Date   BUN 9 11/18/2015   CREATININE 0.59 11/18/2015   GFRAA >60 11/18/2015   GFRNONAA >60 11/18/2015    Hepatic Function Lab Results  Component Value Date   AST 18 11/18/2015   ALT 14 11/18/2015   ALBUMIN 4.0 11/18/2015    Electrolytes Lab Results  Component Value Date   NA 141 11/18/2015   K 4.2 11/18/2015   CL 109 11/18/2015   CALCIUM 9.0 11/18/2015   MG 2.0 11/18/2015     Pain Modulating Vitamins No results found for: [place], [id number], [id number], [id number], VITAMINB12  Coagulation Parameters No results found for: INR, LABPROT  Note: I personally reviewed the above data. Results shared with patient.  Meds  The patient has a current medication list which includes the following prescription(s): vitamin d3, cyclobenzaprine, hydrocodone-acetaminophen, hydrocodone-acetaminophen, hydrocodone-acetaminophen, melatonin, meloxicam, and trazodone.  Current Outpatient Prescriptions on File Prior to Visit  Medication Sig  . Cholecalciferol (VITAMIN D3) 5000 UNITS CAPS Take 5,000 Units by mouth daily. Reported on 11/18/2015  . cyclobenzaprine (FLEXERIL) 10 MG tablet Take 1 tablet (10 mg total) by mouth at bedtime.   No current facility-administered medications on file prior to visit.    ROS  Constitutional: Afebrile, no chills, well hydrated and well nourished Gastrointestinal: No upper or lower GI bleeding, no nausea, no vomiting and no acute GI distress Musculoskeletal: No acute joint swelling or redness, no acute loss of range of motion and no acute onset weakness Neurological: Denies any acute onset apraxia, no episodes of paralysis, no acute loss of coordination, no acute loss of consciousness and no acute onset aphasia, dysarthria, agnosia, or amnesia  Allergies  [name] is allergic to phenergan.  [place]  Medical:  [name]  has a past medical history of Lumbar radicular pain (08/22/2015). Family: family history is not on file. Surgical:  has past surgical history that includes Back surgery; Appendectomy; Cholecystectomy; Cesarean section; and Bunionectomy. Tobacco:  reports that she has never smoked. She has never used smokeless tobacco. Alcohol:  reports that she does not drink alcohol. Drug:  reports that she does not use illicit drugs.  Physical Examination  Constitutional Vitals:  Today's Vitals   02/12/16 0805 02/12/16 0807  BP:  130/76   Pulse: 85   Temp: 98.4 F (36.9 C)   TempSrc: Oral   Resp: 16   Height: 5\' 7"  (1.702 m)   Weight: 217 lb (98.431 kg)   SpO2: 97%   PainSc:  7    Calculated BMI: Body mass index is 33.98 kg/(m^2). Obese (Class I) (30-34.9 kg/m2) - 68% higher incidence of chronic pain General appearance: alert, cooperative, appears stated age, no distress and mildly obese Eyes: PERLA Respiratory: No evidence respiratory distress, no audible rales or ronchi and no use of accessory muscles of respiration  Cervical Spine Exam  Inspection: Normal anatomy, no anomalies observed Cervical Lordosis: Normal Alignment: Symetrical Functional ROM: Within functional limits (WFL) AROM: WFL Sensory: No sensory abnormalities reported  Upper Extremity Exam    Right  Left  Inspection: No gross anomalies detected  Inspection: No gross anomalies detected  Functional ROM: Adequate  Functional ROM: Adequate  AROM: Adequate  AROM: Adequate  Sensory: Normal  No sensory abnormalities reported  Sensory: Normal  No sensory abnormalities reported  Motor: Unremarkable  Motor: Unremarkable  Vascular: Normal skin color, temperature, and hair growth. No peripheral edema or cyanosis  Vascular: Normal skin color, temperature, and hair growth. No peripheral edema or cyanosis   Thoracic Spine  Inspection: No gross anomalies detected Alignment: Symetrical Functional ROM: Within functional limits [facility]) AROM: Adequate Palpation: WNL  Lumbar Spine  Inspection: No gross anomalies detected Alignment: Symetrical Functional ROM: Within functional limits (WFL) AROM: Decreased Palpation: Tender Provocative Tests: Lumbar Hyperextension and rotation test: Positive for bilateral lumbar facet pain Patrick's Maneuver: deferred  Gait Assessment  Gait: WNL  Lower Extremities    Right  Left  Inspection: No gross anomalies detected  Inspection: No gross anomalies detected  Functional ROM: Within functional limits [facility])   Functional ROM: Within functional limits (WFL)  AROM: Adequate  AROM: Adequate  Sensory: Normal  Sensory: Normal  Motor: Unremarkable  Motor: Unremarkable  Toe walk (S1): WNL  Toe walk (S1): WNL  Heal walk (L5): WNL  Heal walk (L5): WNL   Assessment & Plan  Primary Diagnosis & Pertinent Problem List: The primary encounter diagnosis was Chronic pain. 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  Visit Diagnosis: 1. Chronic pain   2. Encounter for therapeutic drug level monitoring   3. Long term current use of opiate analgesic   4. Lumbar facet syndrome (Location of Primary Source of Pain) (Bilateral) (L>R)   5. Chronic low back pain (Location of Primary Source of Pain) (Bilateral) (L>R)   6. Chronic pain of lower extremity, unspecified laterality   7. Chronic lumbar radicular pain (Right) (S1)   8. 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 Insomnia secondary to chronic pain   10. Spondylolisthesis of lumbar region   11. Chronic knee  pain (Location of Tertiary source of pain) (Bilateral) (R>L)   12. 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 In the case of the left lower extremity the pain goes to the back of the leg to the area of the calf, suggesting referred pain from the lower back.    Plan of Care   Problem List Items Addressed This Visit      High   Chronic knee pain (Location of Tertiary source of pain) (Bilateral) (R>L) (Chronic)   Relevant Medications   HYDROcodone-acetaminophen (NORCO) 10-325 MG tablet   HYDROcodone-acetaminophen (NORCO) 10-325 MG tablet   HYDROcodone-acetaminophen (NORCO) 10-325 MG tablet   traZODone (DESYREL) 50 MG tablet   meloxicam (MOBIC) 15 MG tablet   Chronic low back pain (Location of Primary Source of Pain) (Bilateral) (L>R) (Chronic)   Relevant Medications   HYDROcodone-acetaminophen (NORCO) 10-325 MG tablet   HYDROcodone-acetaminophen (NORCO) 10-325 MG tablet   HYDROcodone-acetaminophen (NORCO) 10-325 MG tablet   meloxicam (MOBIC) 15 MG tablet   Chronic lower extremity pain (Location of Secondary source of pain) (Bilateral) (L>R) (Chronic)    In the case of the right lower extremity the pain goes to the bottom of the foot in what seems to be an S1 dermatomal distribution. In the case of the left lower extremity the pain goes to the back of the leg to the area of the calf, suggesting referred pain from the lower back.      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) (Chronic)   Lumbar facet syndrome (Location of Primary Source of Pain) (Bilateral) (L>R) (Chronic)   Relevant Medications   HYDROcodone-acetaminophen (NORCO) 10-325 MG tablet   HYDROcodone-acetaminophen  (NORCO) 10-325 MG tablet   HYDROcodone-acetaminophen (NORCO) 10-325 MG tablet   meloxicam (MOBIC) 15 MG tablet   Other Relevant Orders   LUMBAR FACET(MEDIAL BRANCH NERVE BLOCK) MBNB   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) (Chronic)   Spondylolisthesis of lumbar region (Chronic)   Relevant Medications   meloxicam (MOBIC) 15 MG tablet     Medium   Encounter for therapeutic drug level monitoring   Long term current use of opiate analgesic (Chronic)   Relevant Orders   ToxASSURE Select 13 (MW), Urine     Low   Insomnia secondary to chronic pain (Chronic)   Relevant Medications   HYDROcodone-acetaminophen (NORCO) 10-325 MG tablet   HYDROcodone-acetaminophen (NORCO) 10-325 MG tablet   HYDROcodone-acetaminophen (NORCO) 10-325 MG tablet   Melatonin 10 MG CAPS   traZODone (DESYREL) 50 MG tablet   meloxicam (MOBIC) 15 MG tablet       Pharmacotherapy (Medications Ordered): Meds ordered this encounter  Medications  . HYDROcodone-acetaminophen (NORCO) 10-325 MG tablet    Sig: Take 1 tablet by mouth every 12 (twelve) hours as needed for moderate pain or severe pain.    Dispense:  60 tablet    Refill:  0    Do  not place this medication, or any other prescription from our practice, on "Automatic Refill". Patient may have prescription filled one day early if pharmacy is closed on scheduled refill date. Do not fill until: 02/19/16 To last until: 03/20/16  . HYDROcodone-acetaminophen (NORCO) 10-325 MG tablet    Sig: Take 1 tablet by mouth every 12 (twelve) hours as needed for moderate pain or severe pain.    Dispense:  60 tablet    Refill:  0    Do not place this medication, or any other prescription from our practice, on "Automatic Refill". Patient may have prescription filled one day early if pharmacy is closed on scheduled refill date. Do not fill until: 03/20/16 To last until: 04/19/16  . HYDROcodone-acetaminophen (NORCO) 10-325 MG tablet    Sig: Take 1 tablet by mouth  every 12 (twelve) hours as needed for moderate pain or severe pain.    Dispense:  60 tablet    Refill:  0    Do not place this medication, or any other prescription from our practice, on "Automatic Refill". Patient may have prescription filled one day early if pharmacy is closed on scheduled refill date. Do not fill until: 04/19/16 To last until: 05/19/16  . Melatonin 10 MG CAPS    Sig: Take 10-20 mg by mouth at bedtime.    Dispense:  60 capsule    Refill:  2    Do not place this medication, or any other prescription from our practice, on "Automatic Refill". Patient may have prescription filled one day early if pharmacy is closed on scheduled refill date.  . traZODone (DESYREL) 50 MG tablet    Sig: Take 1-2 tablets (50-100 mg total) by mouth at bedtime as needed for sleep.    Dispense:  60 tablet    Refill:  2    Do not place this medication, or any other prescription from our practice, on "Automatic Refill". Patient may have prescription filled one day early if pharmacy is closed on scheduled refill date.  . meloxicam (MOBIC) 15 MG tablet    Sig: Take 1 tablet (15 mg total) by mouth every morning.    Dispense:  30 tablet    Refill:  2    Do not place this medication, or any other prescription from our practice, on "Automatic Refill". Patient may have prescription filled one day early if pharmacy is closed on scheduled refill date.   Lab-work & Procedure Ordered: Orders Placed This Encounter  Procedures  . LUMBAR FACET(MEDIAL BRANCH NERVE BLOCK) MBNB  . ToxASSURE Select 13 (MW), Urine    Imaging Ordered: None  Interventional Therapies: Scheduled:  Diagnostic bilateral lumbar facet block under fluoroscopic guidance and IV sedation.    Considering:  Possible lumbar facet radiofrequency ablation    PRN Procedures:  Right S1 transforaminal epidural steroid injection under fluoroscopic guidance, without without sedation depending on patient's choice.    Referral(s) or Consult(s):  None at this time.  New Prescriptions   No medications on file    Medications administered during this visit: [name] had no medications administered during this visit.  Future Appointments Date Time Provider [place]  05/11/2016 8:00 AM Milinda Pointer, MD [facility] None    Primary Care Physician: Chrisandra Carota, MD Location: [facility] Note by: Kathlen Brunswick. Dossie Arbour, M.D, DABA, DABAPM, DABPM, DABIPP, FIPP  Pain Score Disclaimer: We use the NRS-11 scale. This is a self-reported, subjective measurement of pain severity with only modest accuracy. It is used primarily to  identify changes within a particular patient. It must be understood that outpatient pain scales are significantly less accurate that those used for research, where they can be applied under ideal controlled circumstances with minimal exposure to variables. In reality, the score is likely to be a combination of pain intensity and pain affect, where pain affect describes the degree of emotional arousal or changes in action readiness caused by the sensory experience of pain. Factors such as social and work situation, setting, emotional state, anxiety levels, expectation, and prior pain experience may influence pain perception and show large inter-individual differences that may also be affected by time variables.

## 2016-02-12 NOTE — Patient Instructions (Signed)
GENERAL RISKS AND COMPLICATIONS  What are the risk, side effects and possible complications? Generally speaking, most procedures are safe.  However, with any procedure there are risks, side effects, and the possibility of complications.  The risks and complications are dependent upon the sites that are lesioned, or the type of nerve block to be performed.  The closer the procedure is to the spine, the more serious the risks are.  Great care is taken when placing the radio frequency needles, block needles or lesioning probes, but sometimes complications can occur. 1. Infection: Any time there is an injection through the skin, there is a risk of infection.  This is why sterile conditions are used for these blocks.  There are four possible types of infection. 1. Localized skin infection. 2. Central Nervous System Infection-This can be in the form of Meningitis, which can be deadly. 3. Epidural Infections-This can be in the form of an epidural abscess, which can cause pressure inside of the spine, causing compression of the spinal cord with subsequent paralysis. This would require an emergency surgery to decompress, and there are no guarantees that the patient would recover from the paralysis. 4. Discitis-This is an infection of the intervertebral discs.  It occurs in about 1% of discography procedures.  It is difficult to treat and it may lead to surgery.        2. Pain: the needles have to go through skin and soft tissues, will cause soreness.       3. Damage to internal structures:  The nerves to be lesioned may be near blood vessels or    other nerves which can be potentially damaged.       4. Bleeding: Bleeding is more common if the patient is taking blood thinners such as  aspirin, Coumadin, Ticiid, Plavix, etc., or if he/she have some genetic predisposition  such as hemophilia. Bleeding into the spinal canal can cause compression of the spinal  cord with subsequent paralysis.  This would require an  emergency surgery to  decompress and there are no guarantees that the patient would recover from the  paralysis.       5. Pneumothorax:  Puncturing of a lung is a possibility, every time a needle is introduced in  the area of the chest or upper back.  Pneumothorax refers to free air around the  collapsed lung(s), inside of the thoracic cavity (chest cavity).  Another two possible  complications related to a similar event would include: Hemothorax and Chylothorax.   These are variations of the Pneumothorax, where instead of air around the collapsed  lung(s), you may have blood or chyle, respectively.       6. Spinal headaches: They may occur with any procedures in the area of the spine.       7. Persistent CSF (Cerebro-Spinal Fluid) leakage: This is a rare problem, but may occur  with prolonged intrathecal or epidural catheters either due to the formation of a fistulous  track or a dural tear.       8. Nerve damage: By working so close to the spinal cord, there is always a possibility of  nerve damage, which could be as serious as a permanent spinal cord injury with  paralysis.       9. Death:  Although rare, severe deadly allergic reactions known as "Anaphylactic  reaction" can occur to any of the medications used.      10. Worsening of the symptoms:  We can always make thing worse.    What are the chances of something like this happening? Chances of any of this occuring are extremely low.  By statistics, you have more of a chance of getting killed in a motor vehicle accident: while driving to the hospital than any of the above occurring .  Nevertheless, you should be aware that they are possibilities.  In general, it is similar to taking a shower.  Everybody knows that you can slip, hit your head and get killed.  Does that mean that you should not shower again?  Nevertheless always keep in mind that statistics do not mean anything if you happen to be on the wrong side of them.  Even if a procedure has a 1  (one) in a 1,000,000 (million) chance of going wrong, it you happen to be that one..Also, keep in mind that by statistics, you have more of a chance of having something go wrong when taking medications.  Who should not have this procedure? If you are on a blood thinning medication (e.g. Coumadin, Plavix, see list of "Blood Thinners"), or if you have an active infection going on, you should not have the procedure.  If you are taking any blood thinners, please inform your physician.  How should I prepare for this procedure?  Do not eat or drink anything at least six hours prior to the procedure.  Bring a driver with you .  It cannot be a taxi.  Come accompanied by an adult that can drive you back, and that is strong enough to help you if your legs get weak or numb from the local anesthetic.  Take all of your medicines the morning of the procedure with just enough water to swallow them.  If you have diabetes, make sure that you are scheduled to have your procedure done first thing in the morning, whenever possible.  If you have diabetes, take only half of your insulin dose and notify our nurse that you have done so as soon as you arrive at the clinic.  If you are diabetic, but only take blood sugar pills (oral hypoglycemic), then do not take them on the morning of your procedure.  You may take them after you have had the procedure.  Do not take aspirin or any aspirin-containing medications, at least eleven (11) days prior to the procedure.  They may prolong bleeding.  Wear loose fitting clothing that may be easy to take off and that you would not mind if it got stained with Betadine or blood.  Do not wear any jewelry or perfume  Remove any nail coloring.  It will interfere with some of our monitoring equipment.  NOTE: Remember that this is not meant to be interpreted as a complete list of all possible complications.  Unforeseen problems may occur.  BLOOD THINNERS The following drugs  contain aspirin or other products, which can cause increased bleeding during surgery and should not be taken for 2 weeks prior to and 1 week after surgery.  If you should need take something for relief of minor pain, you may take acetaminophen which is found in Tylenol,m Datril, Anacin-3 and Panadol. It is not blood thinner. The products listed below are.  Do not take any of the products listed below in addition to any listed on your instruction sheet.  A.P.C or A.P.C with Codeine Codeine Phosphate Capsules #3 Ibuprofen Ridaura  ABC compound Congesprin Imuran rimadil  Advil Cope Indocin Robaxisal  Alka-Seltzer Effervescent Pain Reliever and Antacid Coricidin or Coricidin-D  Indomethacin Rufen    Alka-Seltzer plus Cold Medicine Cosprin Ketoprofen S-A-C Tablets  Anacin Analgesic Tablets or Capsules Coumadin Korlgesic Salflex  Anacin Extra Strength Analgesic tablets or capsules CP-2 Tablets Lanoril Salicylate  Anaprox Cuprimine Capsules Levenox Salocol  Anexsia-D Dalteparin Magan Salsalate  Anodynos Darvon compound Magnesium Salicylate Sine-off  Ansaid Dasin Capsules Magsal Sodium Salicylate  Anturane Depen Capsules Marnal Soma  APF Arthritis pain formula Dewitt's Pills Measurin Stanback  Argesic Dia-Gesic Meclofenamic Sulfinpyrazone  Arthritis Bayer Timed Release Aspirin Diclofenac Meclomen Sulindac  Arthritis pain formula Anacin Dicumarol Medipren Supac  Analgesic (Safety coated) Arthralgen Diffunasal Mefanamic Suprofen  Arthritis Strength Bufferin Dihydrocodeine Mepro Compound Suprol  Arthropan liquid Dopirydamole Methcarbomol with Aspirin Synalgos  ASA tablets/Enseals Disalcid Micrainin Tagament  Ascriptin Doan's Midol Talwin  Ascriptin A/D Dolene Mobidin Tanderil  Ascriptin Extra Strength Dolobid Moblgesic Ticlid  Ascriptin with Codeine Doloprin or Doloprin with Codeine Momentum Tolectin  Asperbuf Duoprin Mono-gesic Trendar  Aspergum Duradyne Motrin or Motrin IB Triminicin  Aspirin  plain, buffered or enteric coated Durasal Myochrisine Trigesic  Aspirin Suppositories Easprin Nalfon Trillsate  Aspirin with Codeine Ecotrin Regular or Extra Strength Naprosyn Uracel  Atromid-S Efficin Naproxen Ursinus  Auranofin Capsules Elmiron Neocylate Vanquish  Axotal Emagrin Norgesic Verin  Azathioprine Empirin or Empirin with Codeine Normiflo Vitamin E  Azolid Emprazil Nuprin Voltaren  Bayer Aspirin plain, buffered or children's or timed BC Tablets or powders Encaprin Orgaran Warfarin Sodium  Buff-a-Comp Enoxaparin Orudis Zorpin  Buff-a-Comp with Codeine Equegesic Os-Cal-Gesic   Buffaprin Excedrin plain, buffered or Extra Strength Oxalid   Bufferin Arthritis Strength Feldene Oxphenbutazone   Bufferin plain or Extra Strength Feldene Capsules Oxycodone with Aspirin   Bufferin with Codeine Fenoprofen Fenoprofen Pabalate or Pabalate-SF   Buffets II Flogesic Panagesic   Buffinol plain or Extra Strength Florinal or Florinal with Codeine Panwarfarin   Buf-Tabs Flurbiprofen Penicillamine   Butalbital Compound Four-way cold tablets Penicillin   Butazolidin Fragmin Pepto-Bismol   Carbenicillin Geminisyn Percodan   Carna Arthritis Reliever Geopen Persantine   Carprofen Gold's salt Persistin   Chloramphenicol Goody's Phenylbutazone   Chloromycetin Haltrain Piroxlcam   Clmetidine heparin Plaquenil   Cllnoril Hyco-pap Ponstel   Clofibrate Hydroxy chloroquine Propoxyphen         Before stopping any of these medications, be sure to consult the physician who ordered them.  Some, such as Coumadin (Warfarin) are ordered to prevent or treat serious conditions such as "deep thrombosis", "pumonary embolisms", and other heart problems.  The amount of time that you may need off of the medication may also vary with the medication and the reason for which you were taking it.  If you are taking any of these medications, please make sure you notify your pain physician before you undergo any  procedures.         Facet Blocks Patient Information  Description: The facets are joints in the spine between the vertebrae.  Like any joints in the body, facets can become irritated and painful.  Arthritis can also effect the facets.  By injecting steroids and local anesthetic in and around these joints, we can temporarily block the nerve supply to them.  Steroids act directly on irritated nerves and tissues to reduce selling and inflammation which often leads to decreased pain.  Facet blocks may be done anywhere along the spine from the neck to the low back depending upon the location of your pain.   After numbing the skin with local anesthetic (like Novocaine), a small needle is passed onto the facet joints under x-ray guidance.    You may experience a sensation of pressure while this is being done.  The entire block usually lasts about 15-25 minutes.   Conditions which may be treated by facet blocks:   Low back/buttock pain  Neck/shoulder pain  Certain types of headaches  Preparation for the injection:  1. Do not eat any solid food or dairy products within 8 hours of your appointment. 2. You may drink clear liquid up to 3 hours before appointment.  Clear liquids include water, black coffee, juice or soda.  No milk or cream please. 3. You may take your regular medication, including pain medications, with a sip of water before your appointment.  Diabetics should hold regular insulin (if taken separately) and take 1/2 normal NPH dose the morning of the procedure.  Carry some sugar containing items with you to your appointment. 4. A driver must accompany you and be prepared to drive you home after your procedure. 5. Bring all your current medications with you. 6. An IV may be inserted and sedation may be given at the discretion of the physician. 7. A blood pressure cuff, EKG and other monitors will often be applied during the procedure.  Some patients may need to have extra oxygen  administered for a short period. 8. You will be asked to provide medical information, including your allergies and medications, prior to the procedure.  We must know immediately if you are taking blood thinners (like Coumadin/Warfarin) or if you are allergic to IV iodine contrast (dye).  We must know if you could possible be pregnant.  Possible side-effects:   Bleeding from needle site  Infection (rare, may require surgery)  Nerve injury (rare)  Numbness & tingling (temporary)  Difficulty urinating (rare, temporary)  Spinal headache (a headache worse with upright posture)  Light-headedness (temporary)  Pain at injection site (serveral days)  Decreased blood pressure (rare, temporary)  Weakness in arm/leg (temporary)  Pressure sensation in back/neck (temporary)   Call if you experience:   Fever/chills associated with headache or increased back/neck pain  Headache worsened by an upright position  New onset, weakness or numbness of an extremity below the injection site  Hives or difficulty breathing (go to the emergency room)  Inflammation or drainage at the injection site(s)  Severe back/neck pain greater than usual  New symptoms which are concerning to you  Please note:  Although the local anesthetic injected can often make your back or neck feel good for several hours after the injection, the pain will likely return. It takes 3-7 days for steroids to work.  You may not notice any pain relief for at least one week.  If effective, we will often do a series of 2-3 injections spaced 3-6 weeks apart to maximally decrease your pain.  After the initial series, you may be a candidate for a more permanent nerve block of the facets.  If you have any questions, please call [phone] [facility] 

## 2016-02-12 NOTE — Progress Notes (Signed)
Hydrocodone 10/325 #5 out of 60 remaining.  Filled on 01-14-16.

## 2016-02-12 NOTE — Progress Notes (Signed)
Safety precautions to be maintained throughout the outpatient stay will include: orient to surroundings, keep bed in low position, maintain call bell within reach at all times, provide assistance with transfer out of bed and ambulation.  

## 2016-02-12 NOTE — Assessment & Plan Note (Signed)
In the case of the right lower extremity the pain goes to the bottom of the foot in what seems to be an S1 dermatomal distribution. In the case of the left lower extremity the pain goes to the back of the leg to the area of the calf, suggesting referred pain from the lower back.

## 2016-02-18 LAB — TOXASSURE SELECT 13 (MW), URINE: PDF: 0

## 2016-03-12 ENCOUNTER — Other Ambulatory Visit: Payer: Self-pay | Admitting: Pain Medicine

## 2016-03-18 ENCOUNTER — Telehealth: Payer: Self-pay | Admitting: Pain Medicine

## 2016-03-18 NOTE — Telephone Encounter (Signed)
Having increased back and leg pain, would like to talk with nurse to discuss options of what to do

## 2016-03-19 ENCOUNTER — Ambulatory Visit
Admission: EM | Admit: 2016-03-19 | Discharge: 2016-03-19 | Disposition: A | Discharge status: Home / Self Care | Payer: Medicare Other | Attending: Emergency Medicine | Admitting: Emergency Medicine

## 2016-03-19 ENCOUNTER — Encounter: Payer: Self-pay | Admitting: Gynecology

## 2016-03-19 DIAGNOSIS — M5416 Radiculopathy, lumbar region: Secondary | ICD-10-CM | POA: Insufficient documentation

## 2016-03-19 DIAGNOSIS — M545 Low back pain: Secondary | ICD-10-CM | POA: Diagnosis present

## 2016-03-19 DIAGNOSIS — Z79899 Other long term (current) drug therapy: Secondary | ICD-10-CM | POA: Insufficient documentation

## 2016-03-19 LAB — URINALYSIS COMPLETE WITH MICROSCOPIC (ARMC ONLY)
BILIRUBIN URINE: NEGATIVE
Glucose, UA: NEGATIVE mg/dL
HGB URINE DIPSTICK: NEGATIVE
KETONES UR: NEGATIVE mg/dL
Nitrite: NEGATIVE
PH: 5.5 (ref 5.0–8.0)
Protein, ur: NEGATIVE mg/dL
SPECIFIC GRAVITY, URINE: 1.025 (ref 1.005–1.030)

## 2016-03-19 MED ORDER — METAXALONE 800 MG PO TABS: 800.0000 mg | ORAL_TABLET | Freq: Three times a day (TID) | ORAL | Status: DC

## 2016-03-19 MED ORDER — KETOROLAC TROMETHAMINE 60 MG/2ML IM SOLN
60.0000 mg | Freq: Once | INTRAMUSCULAR | Status: AC
  Administered 2016-03-19: 60 mg via INTRAMUSCULAR

## 2016-03-19 NOTE — ED Provider Notes (Signed)
CSN: [id number]     Arrival date & time 03/19/16  1622 History   First MD Initiated Contact with Patient 03/19/16 1717     Chief Complaint  Patient presents with  . 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 She states that she did not do any lifting bending but did attend a bluegrass festival the night before her pain began. She sat on lawn chairs and on wooden bleachers states that she did get up and move her quite a bit. The pain radiates into her right right buttock posterior thigh and over the shin to the level of her ankle where the pain seems to is concentrated. She has not had any fever or chills.      Past Medical History  Diagnosis Date  . Lumbar radicular pain 08/22/2015   Past Surgical History  Procedure Laterality Date  . Back surgery    . Appendectomy    . Cholecystectomy    . Cesarean section      X 2  . Bunionectomy      BOTH FEET   No family history on file. Social History  Substance Use Topics  . Smoking status: Never Smoker   . Smokeless tobacco: Never Used  . Alcohol Use: No   OB History    No data available     Review of Systems  Constitutional: Positive for activity change. Negative for fever, chills and fatigue.  Musculoskeletal: Positive for back pain and gait problem.  All other systems reviewed and are negative.   Allergies  Phenergan  Home Medications   Prior to Admission medications   Medication Sig Start Date End Date Taking? Authorizing Provider  HYDROcodone-acetaminophen (NORCO) 10-325 MG tablet Take 1 tablet by mouth every 12 (twelve) hours as needed for moderate pain or severe pain. 02/12/16  Yes Milinda Pointer, MD  HYDROcodone-acetaminophen  (NORCO) 10-325 MG tablet Take 1 tablet by mouth every 12 (twelve) hours as needed for moderate pain or severe pain. 02/12/16  Yes Milinda Pointer, MD  HYDROcodone-acetaminophen (NORCO) 10-325 MG tablet Take 1 tablet by mouth every 12 (twelve) hours as needed for moderate pain or severe pain. 02/12/16  Yes Milinda Pointer, MD  Melatonin 10 MG CAPS Take 10-20 mg by mouth at bedtime. 02/12/16  Yes Milinda Pointer, MD  meloxicam (MOBIC) 15 MG tablet Take 1 tablet (15 mg total) by mouth every morning. 02/12/16  Yes Milinda Pointer, MD  traZODone (DESYREL) 50 MG tablet Take 1-2 tablets (50-100 mg total) by mouth at bedtime as needed for sleep. 02/12/16  Yes Milinda Pointer, MD  Cholecalciferol (VITAMIN D3) 5000 UNITS CAPS Take 5,000 Units by mouth daily. Reported on 11/18/2015    Historical Provider, MD  cyclobenzaprine (FLEXERIL) 10 MG tablet Take 1 tablet (10 mg total) by mouth at bedtime. 11/18/15   Milinda Pointer, MD  metaxalone (SKELAXIN) 800 MG tablet Take 1 tablet (800 mg total) by mouth 3 (three) times daily. 03/19/16   Lorin Picket, PA-C   Meds Ordered and Administered this Visit   Medications  ketorolac (TORADOL) injection 60 mg (60 mg Intramuscular Given 03/19/16 1752)    BP 124/69 mmHg  Pulse 66  Temp(Src) 98.3 F (36.8 C) (Oral)  Resp 18  Ht 5\' 7"  (1.702 m)  Wt 212 lb (96.163 kg)  BMI 33.20 kg/m2  SpO2 98%  LMP 03/08/2016 No data found.   Physical Exam  Constitutional: She is oriented to person, place, and time. She appears well-developed and well-nourished. No distress.  HENT:  Head: Normocephalic and atraumatic.  Eyes: Conjunctivae are normal. Pupils are equal, round, and reactive to light.  Neck: Normal range of motion. Neck supple.  Musculoskeletal: She exhibits tenderness.  Examination lumbar spine shows a well-healed incision midline from approximately L3-S1 she has a level pelvis in stance. She is able to forward flex with her hands to the level of her ankles.  Lateral flexion is painful bilaterally the right. She is able to toe and heel walk adequately. EHL peroneal and anterior tibialis are strong to clinical testing. DTRs are 2+ over 4 and symmetrical. Her leg raise testing is positive at 90 on the right with low back pain and radiation into her right shin. Is tenderness to palpation in the right paraspinous muscles at approximately the L4-5 level.  Neurological: She is alert and oriented to person, place, and time. She has normal reflexes. She displays normal reflexes. She exhibits normal muscle tone. Coordination normal.  Skin: Skin is warm and dry. She is not diaphoretic.  Psychiatric: She has a normal mood and affect. Her behavior is normal. Judgment and thought content normal.  Nursing note and vitals reviewed.   ED Course  Procedures (including critical care time)  Labs Review Labs Reviewed  URINALYSIS COMPLETEWITH MICROSCOPIC ([facility] ONLY) - Abnormal; Notable for the following:    APPearance CLOUDY (*)    Leukocytes, UA 3+ (*)    Bacteria, UA MANY (*)    Squamous Epithelial / LPF 21-50 (*)    All other components within normal limits  URINE CULTURE    Imaging Review No results found.   Visual Acuity Review  Right Eye Distance:   Left Eye Distance:   Bilateral Distance:    Right Eye Near:   Left Eye Near:    Bilateral Near:     Medications  ketorolac (TORADOL) injection 60 mg (60 mg Intramuscular Given 03/19/16 1752)      MDM   1. Acute right lumbar radiculopathy    Discharge Medication List as of 03/19/2016  6:08 PM    START taking these medications   Details  metaxalone (SKELAXIN) 800 MG tablet Take 1 tablet (800 mg total) by mouth 3 (three) times daily., Starting 03/19/2016, Until Discontinued, Normal      Plan: 1. Test/x-ray results and diagnosis reviewed with patient 2. rx as per orders; risks, benefits, potential side effects reviewed with patient 3. Recommend supportive treatment with Symptom avoidance and  rest as necessary. She does have Mobic at home which she will utilize that help augment the pain medications that she normally takes. In addition I will prescribe a muscle relaxer Skelaxin be used as necessary precautions not to perform activities requiring concentration or judgment or drive while taking it. I've asked her to follow-up with her primary care physician next week. Urinalysis today was a dirty catch she's had no symptoms. 4. F/u prn if symptoms worsen or don't improve  I was notified by the pharmacist that the patient is on medical care and will not cover Skelaxin. Will place her on Zanaflex which is covered 2 mg by mouth 3 times a day   Lorin Picket, PA-C 03/19/16 1814

## 2016-03-19 NOTE — Discharge Instructions (Signed)
Lumbosacral Radiculopathy °Lumbosacral radiculopathy is a condition that involves the spinal nerves and nerve roots in the low back and bottom of the spine. The condition develops when these nerves and nerve roots move out of place or become inflamed and cause symptoms. °CAUSES °This condition may be caused by: °· Pressure from a disk that bulges out of place (herniated disk). A disk is a plate of cartilage that separates bones in the spine. °· Disk degeneration. °· A narrowing of the bones of the lower back (spinal stenosis). °· A tumor. °· An infection. °· An injury that places sudden pressure on the disks that cushion the bones of your lower spine. °RISK FACTORS °This condition is more likely to develop in: °· Males aged 30-50 years. °· Females aged 50-60 years. °· People who lift improperly. °· People who are overweight or live a sedentary lifestyle. °· People who smoke. °· People who perform repetitive activities that strain the spine. °SYMPTOMS °Symptoms of this condition include: °· Pain that goes down from the back into the legs (sciatica). This is the most common symptom. The pain may be worse with sitting, coughing, or sneezing. °· Pain and numbness in the arms and legs. °· Muscle weakness. °· Tingling. °· Loss of bladder control or bowel control. °DIAGNOSIS °This condition is diagnosed with a physical exam and medical history. If the pain is lasting, you may have tests, such as: °· MRI scan. °· X-ray. °· CT scan. °· Myelogram. °· Nerve conduction study. °TREATMENT °This condition is often treated with: °· Hot packs and ice applied to affected areas. °· Stretches to improve flexibility. °· Exercises to strengthen back muscles. °· Physical therapy. °· Pain medicine. °· A steroid injection in the spine. °In some cases, no treatment is needed. If the condition is long-lasting (chronic), or if symptoms are severe, treatment may involve surgery or lifestyle changes, such as following a weight loss plan. °HOME  CARE INSTRUCTIONS °Medicines °· Take medicines only as directed by your health care provider. °· Do not drive or operate heavy machinery while taking pain medicine. °Injury Care °· Apply a heat pack to the injured area as directed by your health care provider. °· Apply ice to the affected area: °¨ Put ice in a plastic bag. °¨ Place a towel between your skin and the bag. °¨ Leave the ice on for 20-30 minutes, every 2 hours while you are awake or as needed. Or, leave the ice on for as long as directed by your health care provider. °Other Instructions °· If you were shown how to do any exercises or stretches, do them as directed by your health care provider. °· If your health care provider prescribed a diet or exercise program, follow it as directed. °· Keep all follow-up visits as directed by your health care provider. This is important. °SEEK MEDICAL CARE IF: °· Your pain does not improve over time even when taking pain medicines. °SEEK IMMEDIATE MEDICAL CARE IF: °· Your develop severe pain. °· Your pain suddenly gets worse. °· You develop increasing weakness in your legs. °· You lose the ability to control your bladder or bowel. °· You have difficulty walking or balancing. °· You have a fever. °  °This information is not intended to replace advice given to you by your health care provider. Make sure you discuss any questions you have with your health care provider. °  °Document Released: 10/19/2005 Document Revised: 03/05/2015 Document Reviewed: 10/15/2014 °[employer] Interactive Patient Education ©2016 [employer] ° °

## 2016-03-19 NOTE — ED Notes (Signed)
Per patient status post back pain from injury in 2012. Patient stated lower back pain going down her right leg. Patient stated was not able to get an appointment with her pain specialist.

## 2016-03-21 LAB — URINE CULTURE

## 2016-03-24 ENCOUNTER — Telehealth: Payer: Self-pay | Admitting: *Deleted

## 2016-03-24 NOTE — Telephone Encounter (Signed)
Spoke with patient re; c/o increased back and leg pain. Patient is upset that she called on 03/18/16 and then called back 3 days in a row and no one returned her phone call. Patient states that she has already gone to urgent care to address the situation and was treated and is also doing better.  Apologized profusely to the patient that she was not called back or felt as if she was put off, that was not our intention and I would make sure that this was addressed with the proper person.  Alinda Sierras RN, AD of unit made aware.  Records show that call was taken on 03/18/16 but was not routed to clinical staff until 03/24/16

## 2016-04-15 ENCOUNTER — Encounter: Payer: Self-pay | Admitting: Pain Medicine

## 2016-04-15 ENCOUNTER — Ambulatory Visit
Admission: RE | Admit: 2016-04-15 | Discharge: 2016-04-15 | Disposition: A | Discharge status: Home / Self Care | Payer: Worker's Compensation | Source: Ambulatory Visit | Attending: Pain Medicine | Admitting: Pain Medicine

## 2016-04-15 ENCOUNTER — Ambulatory Visit: Payer: Worker's Compensation | Attending: Pain Medicine | Admitting: Pain Medicine

## 2016-04-15 VITALS — BP 114/57 | HR 62 | Temp 98.0°F | Resp 16 | Ht 67.0 in | Wt 215.0 lb

## 2016-04-15 DIAGNOSIS — G8929 Other chronic pain: Secondary | ICD-10-CM

## 2016-04-15 DIAGNOSIS — M7122 Synovial cyst of popliteal space [Baker], left knee: Secondary | ICD-10-CM | POA: Insufficient documentation

## 2016-04-15 DIAGNOSIS — G4700 Insomnia, unspecified: Secondary | ICD-10-CM | POA: Insufficient documentation

## 2016-04-15 DIAGNOSIS — M461 Sacroiliitis, not elsewhere classified: Secondary | ICD-10-CM

## 2016-04-15 DIAGNOSIS — M533 Sacrococcygeal disorders, not elsewhere classified: Secondary | ICD-10-CM | POA: Insufficient documentation

## 2016-04-15 DIAGNOSIS — M961 Postlaminectomy syndrome, not elsewhere classified: Secondary | ICD-10-CM

## 2016-04-15 DIAGNOSIS — M170 Bilateral primary osteoarthritis of knee: Secondary | ICD-10-CM

## 2016-04-15 DIAGNOSIS — M160 Bilateral primary osteoarthritis of hip: Secondary | ICD-10-CM

## 2016-04-15 DIAGNOSIS — E538 Deficiency of other specified B group vitamins: Secondary | ICD-10-CM | POA: Insufficient documentation

## 2016-04-15 DIAGNOSIS — M5126 Other intervertebral disc displacement, lumbar region: Secondary | ICD-10-CM | POA: Diagnosis not present

## 2016-04-15 DIAGNOSIS — M4310 Spondylolisthesis, site unspecified: Secondary | ICD-10-CM

## 2016-04-15 DIAGNOSIS — E669 Obesity, unspecified: Secondary | ICD-10-CM | POA: Insufficient documentation

## 2016-04-15 DIAGNOSIS — M62830 Muscle spasm of back: Secondary | ICD-10-CM | POA: Diagnosis not present

## 2016-04-15 DIAGNOSIS — M8929 Other disorders of bone development and growth, multiple sites: Secondary | ICD-10-CM | POA: Insufficient documentation

## 2016-04-15 DIAGNOSIS — M7918 Myalgia, other site: Secondary | ICD-10-CM

## 2016-04-15 DIAGNOSIS — M47816 Spondylosis without myelopathy or radiculopathy, lumbar region: Secondary | ICD-10-CM

## 2016-04-15 DIAGNOSIS — X58XXXA Exposure to other specified factors, initial encounter: Secondary | ICD-10-CM | POA: Insufficient documentation

## 2016-04-15 DIAGNOSIS — M539 Dorsopathy, unspecified: Secondary | ICD-10-CM

## 2016-04-15 DIAGNOSIS — M545 Low back pain, unspecified: Secondary | ICD-10-CM

## 2016-04-15 DIAGNOSIS — M1610 Unilateral primary osteoarthritis, unspecified hip: Secondary | ICD-10-CM | POA: Diagnosis not present

## 2016-04-15 DIAGNOSIS — R0789 Other chest pain: Secondary | ICD-10-CM | POA: Insufficient documentation

## 2016-04-15 DIAGNOSIS — L259 Unspecified contact dermatitis, unspecified cause: Secondary | ICD-10-CM | POA: Insufficient documentation

## 2016-04-15 DIAGNOSIS — M4806 Spinal stenosis, lumbar region: Secondary | ICD-10-CM | POA: Insufficient documentation

## 2016-04-15 DIAGNOSIS — M47817 Spondylosis without myelopathy or radiculopathy, lumbosacral region: Secondary | ICD-10-CM

## 2016-04-15 DIAGNOSIS — Z981 Arthrodesis status: Secondary | ICD-10-CM | POA: Insufficient documentation

## 2016-04-15 DIAGNOSIS — M25461 Effusion, right knee: Secondary | ICD-10-CM | POA: Diagnosis not present

## 2016-04-15 DIAGNOSIS — M65861 Other synovitis and tenosynovitis, right lower leg: Secondary | ICD-10-CM | POA: Diagnosis not present

## 2016-04-15 DIAGNOSIS — M25552 Pain in left hip: Secondary | ICD-10-CM | POA: Insufficient documentation

## 2016-04-15 DIAGNOSIS — M47818 Spondylosis without myelopathy or radiculopathy, sacral and sacrococcygeal region: Secondary | ICD-10-CM

## 2016-04-15 DIAGNOSIS — M25551 Pain in right hip: Secondary | ICD-10-CM | POA: Diagnosis not present

## 2016-04-15 DIAGNOSIS — M791 Myalgia: Secondary | ICD-10-CM | POA: Diagnosis not present

## 2016-04-15 DIAGNOSIS — Z79891 Long term (current) use of opiate analgesic: Secondary | ICD-10-CM | POA: Insufficient documentation

## 2016-04-15 DIAGNOSIS — S83206A Unspecified tear of unspecified meniscus, current injury, right knee, initial encounter: Secondary | ICD-10-CM | POA: Diagnosis not present

## 2016-04-15 DIAGNOSIS — M25559 Pain in unspecified hip: Secondary | ICD-10-CM

## 2016-04-15 DIAGNOSIS — E559 Vitamin D deficiency, unspecified: Secondary | ICD-10-CM | POA: Diagnosis not present

## 2016-04-15 DIAGNOSIS — G9619 Other disorders of meninges, not elsewhere classified: Secondary | ICD-10-CM | POA: Insufficient documentation

## 2016-04-15 DIAGNOSIS — D509 Iron deficiency anemia, unspecified: Secondary | ICD-10-CM | POA: Insufficient documentation

## 2016-04-15 DIAGNOSIS — Q762 Congenital spondylolisthesis: Secondary | ICD-10-CM

## 2016-04-15 DIAGNOSIS — M792 Neuralgia and neuritis, unspecified: Secondary | ICD-10-CM

## 2016-04-15 DIAGNOSIS — G4733 Obstructive sleep apnea (adult) (pediatric): Secondary | ICD-10-CM | POA: Diagnosis not present

## 2016-04-15 DIAGNOSIS — M25529 Pain in unspecified elbow: Secondary | ICD-10-CM | POA: Diagnosis present

## 2016-04-15 DIAGNOSIS — M4316 Spondylolisthesis, lumbar region: Secondary | ICD-10-CM | POA: Diagnosis not present

## 2016-04-15 DIAGNOSIS — M549 Dorsalgia, unspecified: Secondary | ICD-10-CM | POA: Diagnosis present

## 2016-04-15 MED ORDER — METHYLPREDNISOLONE 4 MG PO TBPK: ORAL_TABLET | ORAL | Status: DC

## 2016-04-15 MED ORDER — KETOROLAC TROMETHAMINE 60 MG/2ML IM SOLN: 60.0000 mg | Freq: Once | INTRAMUSCULAR | Status: AC

## 2016-04-15 MED ORDER — ORPHENADRINE CITRATE 30 MG/ML IJ SOLN
INTRAMUSCULAR | Status: AC
  Administered 2016-04-15: 08:00:00 via INTRAMUSCULAR
  Filled 2016-04-15: qty 2

## 2016-04-15 MED ORDER — CYCLOBENZAPRINE HCL 10 MG PO TABS: 10.0000 mg | ORAL_TABLET | Freq: Three times a day (TID) | ORAL | Status: DC | PRN

## 2016-04-15 MED ORDER — GABAPENTIN 300 MG PO CAPS: 300.0000 mg | ORAL_CAPSULE | Freq: Every day | ORAL | Status: DC

## 2016-04-15 MED ORDER — KETOROLAC TROMETHAMINE 60 MG/2ML IM SOLN
INTRAMUSCULAR | Status: AC
  Administered 2016-04-15: 08:00:00 via INTRAMUSCULAR
  Filled 2016-04-15: qty 2

## 2016-04-15 MED ORDER — ORPHENADRINE CITRATE 30 MG/ML IJ SOLN: 60.0000 mg | Freq: Once | INTRAMUSCULAR | Status: DC

## 2016-04-15 NOTE — Progress Notes (Signed)
Patient's Name: Stephanie [name]  Patient type: Established  MRN: [id number]  Service setting: Ambulatory outpatient  DOB: 04/21/1961  Location: [facility]  DOS: 04/15/2016  Primary Care Physician: Chrisandra Carota, MD  Note by: Kathlen Brunswick. Dossie Arbour, M.D, DABA, Hauser, DABPM, DABIPP, FIPP  Referring Physician: Chrisandra Carota, MD  Specialty: Board-Certified Interventional Pain Management  Last Visit to Pain Management: 03/24/2016   Primary Reason(s) for Visit: Encounter for prescription drug management (Level of risk: moderate) CC: Back Pain   HPI  Stephanie [name] is a 55 y.o. year old, female patient, who returns today as an established patient. 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 Her primarily concern today is the Back Pain   Pain Assessment: Self-Reported Pain Score: 6 , clinically she looks like a 3-4 were 10. Reported level is inconsistent with clinical obrservations. Information regarding the proper use of the pain scale provided to the patient today. Pain Type: Chronic pain Pain Location: Back Pain Orientation: Lower Pain Descriptors / Indicators: Stabbing, Constant Pain Frequency: Constant  The patient comes into the clinics today for pharmacological management of her chronic pain. I last saw this patient on 03/18/2016. The patient  reports that she does not use illicit drugs. Her body mass index is 33.67 kg/(m^2).   Went to Urgent care 03-18-16, was given Toradol injection. It helped for a short period of time. Patient states she has the worst pain when she lays down. Has not slept. Pain takes her breath away when she turns or tries to get up. Has had cramps in right thigh and swelling in legs and feet. States she has always had back pain but this is so much different than it has been before. Does not recall doing anything to cause the pain.   Date of Last Visit: 02/12/16 Service Provided on Last Visit: Med Refill  Controlled Substance Pharmacotherapy Assessment & REMS (Risk Evaluation and Mitigation Strategy)  Analgesic: Hydrocodone/APAP 10/325 one every 12 hours (20 mg/day) Pill Count: Today was not a refill appointment and therefore she did not bring her medicines to be counted. MME/day: 20 mg/day.  Pharmacokinetics: Onset of action (Liberation/Absorption): Within expected pharmacological parameters Time to Peak effect (Distribution): Timing and results are as within normal expected parameters Duration of action (Metabolism/Excretion): Within normal limits for  medication Pharmacodynamics: Analgesic Effect: More than 50% Activity Facilitation: Medication(s) allow patient to sit,  stand, walk, and do the basic ADLs Perceived Effectiveness: Described as relatively effective, allowing for increase in activities of daily living (ADL) Side-effects or Adverse reactions: None reported Monitoring: [place] PMP: Online review of the past 78-month period conducted. Compliant with practice rules and regulations Last UDS on record: TOXASSURE SELECT 13  Date Value Ref Range Status  02/12/2016 FINAL  Final    Comment:    ==================================================================== TOXASSURE SELECT 13 (MW) ==================================================================== Test                             Result       Flag       Units Drug Present and Declared for Prescription Verification   Hydrocodone                    2350         EXPECTED   ng/mg creat   Hydromorphone                  304          EXPECTED   ng/mg creat   Dihydrocodeine                 392          EXPECTED   ng/mg creat   Norhydrocodone                 2588         EXPECTED   ng/mg creat    Sources of hydrocodone include scheduled prescription    medications. Hydromorphone, dihydrocodeine and norhydrocodone are    expected metabolites of hydrocodone. Hydromorphone and    dihydrocodeine are also available as scheduled prescription    medications. ==================================================================== Test                      Result    Flag   Units      Ref Range   Creatinine              153              mg/dL      >=20 ==================================================================== Declared Medications:  The flagging and interpretation on this report are based on the  following declared medications.  Unexpected results may arise from  inaccuracies in the declared medications.  **Note: The testing scope of this panel includes these medications:  Hydrocodone  (Norco)  **Note: The testing scope of this panel does not include following  reported medications:  Acetaminophen (Norco)  Cyclobenzaprine (Flexeril)  Melatonin  Meloxicam (Mobic)  Trazodone (Desyrel)  Vitamin D3 ==================================================================== For clinical consultation, please call [phone]. ====================================================================    UDS interpretation: Compliant Medication Assessment Form: Reviewed. Patient indicates being compliant with therapy Treatment compliance: Compliant Risk Assessment: Aberrant Behavior: None observed today Substance Use Disorder (SUD) Risk Level: Low Risk of opioid abuse or dependence: 0.7-3.0% with doses ? 36 MME/day and 6.1-26% with doses ? 120 MME/day. Opioid Risk Tool (ORT) Score: Total Score: 0 Low Risk for SUD (Score <3) Depression Scale Score: PHQ-2: PHQ-2 Total Score: 0 No depression (0) PHQ-9: PHQ-9 Total Score: 0 No depression (0-4)  Pharmacologic Plan: No change in therapy, at this time  Laboratory Chemistry  Inflammation Markers Lab Results  Component Value Date   ESRSEDRATE 27 11/18/2015   CRP 0.7 11/18/2015    Renal Function Lab Results  Component Value Date   BUN 9  11/18/2015   CREATININE 0.59 11/18/2015   GFRAA >60 11/18/2015   GFRNONAA >60 11/18/2015    Hepatic Function Lab Results  Component Value Date   AST 18 11/18/2015   ALT 14 11/18/2015   ALBUMIN 4.0 11/18/2015    Electrolytes Lab Results  Component Value Date   NA 141 11/18/2015   K 4.2 11/18/2015   CL 109 11/18/2015   CALCIUM 9.0 11/18/2015   MG 2.0 11/18/2015    Pain Modulating Vitamins No results found for: [place], [id number], [id number], [id number], VITAMINB12  Coagulation Parameters Lab Results  Component Value Date   PLT 298 04/10/2014    Note: Labs Reviewed.  Recent Diagnostic Imaging  Lumbosacral Imaging: Lumbar MR wo contrast:  Results for orders placed in  visit on 01/23/14  MR L Spine Ltd W/O Cm   Narrative * PRIOR REPORT IMPORTED FROM AN EXTERNAL SYSTEM *   CLINICAL DATA:  Lifting injury with resulting low back and bilateral  leg pain. Back surgery L5 2010.   EXAM:  MRI LUMBAR SPINE WITHOUT CONTRAST   TECHNIQUE:  Multiplanar, multisequence MR imaging was performed. No intravenous  contrast was administered.   COMPARISON:  CT ABD-PELV W/ CM dated 07/27/2006; DG SACRUM/COCCYX 2+V  dated 01/17/2014   FINDINGS:  The reformatted images from the prior CT are unavailable. Five  lumbar type vertebral bodies are assumed. The patient has undergone  interval laminectomy and pedicle screw fusion at L5-S1 for  underlying bilateral L5 pars defects. The L5-S1 fusion appears  solid. There is approximately 12 mm of anterolisthesis at the  operative level. The alignment is otherwise normal. There is no  evidence of acute fracture.   The conus medullaris extends to the T12 level and appears normal. No  paraspinal abnormalities are identified.Small ovarian follicles are  noted.   There are no significant disc space findings from T11-12 through  L2-3.   L3-4: Disc desiccation with shallow central disc protrusion. No  significant resulting spinal stenosis or nerve root encroachment.   L4-5: There is disc degeneration with annular bulging and a  broad-based central disc protrusion. Mild facet and ligamentous  hypertrophy is present bilaterally. There is mild resulting  right-greater-than-left foraminal stenosis without definite exiting  L4 nerve root encroachment. The central canal and lateral recesses  appear patent.   L5-S1: Postsurgical changes as described above. Due to the  anterolisthesis, there is mild biforaminal stenosis without definite  exiting L5 nerve root encroachment. The spinal canal and foramina  appear patent.   IMPRESSION:  1. Interval laminectomy and PLIF at L5-S1 for bilateral L5 pars  defects. There is a persistent  anterolisthesis at the operative  level contributing to mild biforaminal stenosis.  2. Broad-based central disc protrusion at L4-5 contributes to mild  right-greater-than-left foraminal stenosis, but no definite nerve  root encroachment.  3. Shallow central disc protrusion at L3-4 without resulting spinal  stenosis or nerve root encroachment.    Electronically Signed    By: Camie Patience M.D.    On: 01/23/2014 10:30       Sacroiliac Joint Imaging: Sacroiliac Joint DG:  Results for orders placed in visit on 01/17/14  DG Si Joints   Narrative * PRIOR REPORT IMPORTED FROM AN EXTERNAL SYSTEM *   CLINICAL DATA:  Bilateral SI joints pain   EXAM:  BILATERAL SACROILIAC JOINTS - 3+ VIEW   COMPARISON:  None.   FINDINGS:  The sacroiliac joint spaces are maintained. Metallic fixation screws  and bars are noted L5-S1 level  lumbosacral junction. Pelvic  phleboliths are noted. There is no fusion of SI joints. Mild  degenerative changes with mild sclerosis of iliac bones at the level  of SI joint.   IMPRESSION:  No acute fracture or subluxation. Postsurgical changes lumbosacral  junction. Mild degenerative changes bilateral SI joints with mild  sclerosis without bony fusion.    Electronically Signed    By: Lahoma Crocker M.D.    On: 01/17/2014 16:43       Knee Imaging: Knee-L MR w contrast:  Results for orders placed in visit on 01/17/14  MR Knee Left  Wo Contrast   Narrative * PRIOR REPORT IMPORTED FROM AN EXTERNAL SYSTEM *   CLINICAL DATA:  Left knee pain.   EXAM:  MRI OF THE LEFT KNEE WITHOUT CONTRAST   TECHNIQUE:  Multiplanar, multisequence MR imaging of the knee was performed. No  intravenous contrast was administered.   COMPARISON:  None.   FINDINGS:  Exam is somewhat limited by patient motion.   MENISCI   Medial meniscus: Moderate degenerative changes without discrete  tear. Shallow surface tears are possible.   Lateral meniscus:  Intact.  Mild degenerative  changes.   LIGAMENTS   Cruciates:  Intact.   Collaterals:  Intact.  Moderate MCL and pes anserine bursitis.   CARTILAGE   Patellofemoral:  Mild to moderate degenerative chondrosis.   Medial: Moderate to advanced degenerative chondrosis with areas of  full or near full-thickness cartilage loss. There is also joint  space narrowing and osteophytic spurring.   Lateral: Mild to moderate degenerative chondrosis. Minimal joint  space narrowing and mild osteophytic spurring.   Joint: Moderate-sized joint effusion with superior and medial  patellar plica and moderate synovitis.   Popliteal Fossa:  Moderate-sized Baker's cyst.   Extensor Mechanism: The patella retinacular structures are intact  and the quadriceps and patellar tendons are intact.   Bones:  No acute bony findings.   IMPRESSION:  1. Moderate degenerative changes involving the medial meniscus  without discrete tear. Shallow surface tears are possible.  2. Intact ligamentous structures and no acute bony findings.  3. Tricompartmental degenerative changes most notable in the medial  compartment as discussed above.  4. Moderate-sized joint effusion and moderate-sized Baker's cyst.    Electronically Signed    By: Kalman Jewels M.D.    On: 01/17/2014 15:38       Knee-R MR wo contrast:  Results for orders placed in visit on 01/17/14  MR Knee Right Wo Contrast   Narrative * PRIOR REPORT IMPORTED FROM AN EXTERNAL SYSTEM *   CLINICAL DATA:  Medial right knee pain.   EXAM:  MRI OF THE RIGHT KNEE WITHOUT CONTRAST   TECHNIQUE:  Multiplanar, multisequence MR imaging of the knee was performed. No  intravenous contrast was administered.   COMPARISON:  None.   FINDINGS:  MENISCI   Medial meniscus: Severely degenerated and torn medial meniscus in  the mid body region extending up into the anterior horn.   Lateral meniscus:  Intact.   LIGAMENTS   Cruciates:  Intact.   Collaterals:  Intact.  Mild MCL and pes  anserine bursitis.   CARTILAGE   Patellofemoral:  Moderate degenerative chondrosis.   Medial: Advanced degenerative chondrosis with areas of full or near  full-thickness cartilage loss. There is joint space narrowing and  osteophytic spurring along with subchondral stress edema.   Lateral:  Mild degenerative chondrosis.   Joint: Moderate-sized joint effusion with synovitis and patellar  plica.   Popliteal  Fossa:  No popliteal mass or Baker's cyst.   Extensor Mechanism: The patella retinacular structures are intact  and the quadriceps and patellar tendons are intact.   Bones: No acute bony findings. Fairly diffuse low T1 metaphyseal  marrow signal could be due to anemia, obesity, smoking or  osteoporosis. Recommend clinical correlation.   IMPRESSION:  1. Severely degenerated and torn medial meniscus.  2. Intact ligamentous structures and no acute bony findings.  3. Tricompartmental degenerative changes most notable in the medial  compartment.  4. Moderate-sized joint effusion with patella plica and synovitis.  5. Fairly diffuse low T1 signal intensity in the metaphyseal regions  likely due to obesity, anemia, osteoporosis or smoking.    Electronically Signed    By: Kalman Jewels M.D.    On: 01/17/2014 14:25       Meds  The patient has a current medication list which includes the following prescription(s): vitamin d3, hydrocodone-acetaminophen, hydrocodone-acetaminophen, hydrocodone-acetaminophen, melatonin, meloxicam, cyclobenzaprine, gabapentin, and methylprednisolone, and the following Facility-Administered Medications: ketorolac and orphenadrine.  Current Outpatient Prescriptions on File Prior to Visit  Medication Sig  . Cholecalciferol (VITAMIN D3) 5000 UNITS CAPS Take 5,000 Units by mouth daily. Reported on 11/18/2015  . HYDROcodone-acetaminophen (NORCO) 10-325 MG tablet Take 1 tablet by mouth every 12 (twelve) hours as needed for moderate pain or severe pain.  Marland Kitchen  HYDROcodone-acetaminophen (NORCO) 10-325 MG tablet Take 1 tablet by mouth every 12 (twelve) hours as needed for moderate pain or severe pain.  Marland Kitchen HYDROcodone-acetaminophen (NORCO) 10-325 MG tablet Take 1 tablet by mouth every 12 (twelve) hours as needed for moderate pain or severe pain.  . Melatonin 10 MG CAPS Take 10-20 mg by mouth at bedtime.  . meloxicam (MOBIC) 15 MG tablet Take 1 tablet (15 mg total) by mouth every morning.   No current facility-administered medications on file prior to visit.    ROS  Constitutional: Denies any fever or chills Gastrointestinal: No reported hemesis, hematochezia, vomiting, or acute GI distress Musculoskeletal: Denies any acute onset joint swelling, redness, loss of ROM, or weakness Neurological: No reported episodes of acute onset apraxia, aphasia, dysarthria, agnosia, amnesia, paralysis, loss of coordination, or loss of consciousness  Allergies  Stephanie [name] is allergic to phenergan.  [place]  Medical:  Stephanie [name]  has a past medical history of Lumbar radicular pain (08/22/2015). Family: family history is not on file. Surgical:  has past surgical history that includes Back surgery; Appendectomy; Cholecystectomy; Cesarean section; and Bunionectomy. Tobacco:  reports that she has never smoked. She has never used smokeless tobacco. Alcohol:  reports that she does not drink alcohol. Drug:  reports that she does not use illicit drugs.  Constitutional Exam  Vitals: Blood pressure 114/57, pulse 62, temperature 98 F (36.7 C), resp. rate 16, height 5\' 7"  (1.702 m), weight 215 lb (97.523 kg), last menstrual period 03/08/2016, SpO2 100 %. General appearance: Well nourished, well developed, and well hydrated. In no acute distress Calculated BMI/Body habitus: Body mass index is 33.67 kg/(m^2). (30-34.9 kg/m2) Obese (Class I) - 68% higher incidence of chronic pain Psych/Mental status: Alert and oriented x 3 (person, place, & time) Eyes: PERLA Respiratory: No  evidence of acute respiratory distress  Cervical Spine Exam  Inspection: No masses, redness, or swelling Alignment: Symmetrical ROM: Functional: ROM is within functional limits [facility]) Stability: No instability detected Muscle strength & Tone: Functionally intact Sensory: Unimpaired Palpation: No complaints of tenderness  Upper Extremity (UE) Exam    Side: Right upper extremity  Side: Left  upper extremity  Inspection: No masses, redness, swelling, or asymmetry  Inspection: No masses, redness, swelling, or asymmetry  ROM:  ROM:  Functional: ROM is within functional limits [facility])  Functional: ROM is within functional limits [facility])  Muscle strength & Tone: Functionally intact  Muscle strength & Tone: Functionally intact  Sensory: Unimpaired  Sensory: Unimpaired  Palpation: Non-contributory  Palpation: Non-contributory   Thoracic Spine Exam  Inspection: No masses, redness, or swelling Alignment: Symmetrical ROM: Functional: ROM is within functional limits [facility]) Stability: No instability detected Sensory: Unimpaired Muscle strength & Tone: Functionally intact Palpation: No complaints of tenderness  Lumbar Spine Exam  Inspection: No masses, redness, or swelling Alignment: Symmetrical ROM: Functional: Decreased ROM with significant guarding Stability: No instability detected Muscle strength & Tone: Functionally intact Sensory: Pain pattern appears Referred Palpation: Tender to palpation of the paravertebral muscles, bilaterally Provocative Tests: Lumbar Hyperextension and rotation test: Positive bilaterally for lumbar facet joint pain. Patrick's Maneuver: Positive bilaterally for sacroiliac joint pain and hip joint pain.  Gait & Posture Assessment  Ambulation: Unassisted Gait: Antalgic Posture: Difficulty with positional changes  Lower Extremity Exam    Side: Right lower extremity  Side: Left lower extremity  Inspection: No masses, redness, swelling, or asymmetry ROM:   Inspection: No masses, redness, swelling, or asymmetry ROM:  Functional: Limited ROM for the hip joint and the knee.   Functional: Limited ROM for the hip joint and knee.   Muscle strength & Tone: Guarding  Muscle strength & Tone: Guarding  Sensory: Unimpaired  Sensory: Unimpaired  Palpation: Non-contributory  Palpation: Non-contributory   Assessment & Plan  Primary Diagnosis & Pertinent Problem List: The primary encounter diagnosis was Acute low back pain. 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  Visit Diagnosis: 1. Acute low back pain   2. Musculoskeletal pain   3. Neurogenic pain   4. Spasm of paraspinal muscle   5. Lumbar facet syndrome (Location of Primary Source of Pain) (Bilateral) (L>R)   6. Chronic hip pain, unspecified laterality   7. Primary osteoarthritis of both knees   8. Primary osteoarthritis of both hips   9. Osteoarthritis of sacroiliac joint (Bilateral)   10. 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 Evlyn Clines, [place])   47. 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 Chronic sacroiliac joint pain (Bilateral)     Problems updated and reviewed during this visit: Problem  Acute Low Back Pain  Musculoskeletal Pain  Neurogenic Pain  Spasm of Paraspinal Muscle  Chronic hip pain (Bilateral)  Osteoarthritis of knee (Bilateral)  Osteoarthritis of hip (Bilateral)  Osteoarthritis of  sacroiliac joint (Bilateral)  Chronic sacroiliac joint pain (Bilateral)    Problem-specific Plan(s): No problem-specific assessment & plan notes found for this encounter.  No new assessment & plan notes have been filed under this hospital service since the last note was generated. Service: Pain Management   Plan of Care   Problem List Items Addressed This Visit      High   Acute low back pain -  Primary   Relevant Medications   orphenadrine (NORFLEX) injection 60 mg   ketorolac (TORADOL) injection 60 mg   cyclobenzaprine (FLEXERIL) 10 MG tablet   methylPREDNISolone (MEDROL) 4 MG TBPK tablet   orphenadrine (NORFLEX) 30 MG/ML injection (Completed)   ketorolac (TORADOL) 60 MG/2ML injection (Completed)   Chronic hip pain (Bilateral) (Chronic)   Relevant Medications   orphenadrine (NORFLEX) injection 60 mg   ketorolac (TORADOL) injection 60 mg   cyclobenzaprine (FLEXERIL) 10 MG tablet   methylPREDNISolone (MEDROL) 4 MG TBPK tablet   gabapentin (NEURONTIN) 300 MG capsule   orphenadrine (NORFLEX) 30 MG/ML injection (Completed)   ketorolac (TORADOL) 60 MG/2ML injection (Completed)   Other Relevant Orders   DG HIP UNILAT W OR W/O PELVIS 2-3 VIEWS LEFT (Completed)   DG HIP UNILAT W OR W/O PELVIS 2-3 VIEWS RIGHT (Completed)   Chronic sacroiliac joint pain (Bilateral) (Chronic)   Relevant Medications   orphenadrine (NORFLEX) injection 60 mg   ketorolac (TORADOL) injection 60 mg   cyclobenzaprine (FLEXERIL) 10 MG tablet   methylPREDNISolone (MEDROL) 4 MG TBPK tablet   orphenadrine (NORFLEX) 30 MG/ML injection (Completed)   ketorolac (TORADOL) 60 MG/2ML injection (Completed)   Other Relevant Orders   DG Si Joints (Completed)   SACROILIAC JOINT INJECTINS   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) (Chronic)   Relevant Orders   DG Lumbar Spine Complete W/Bend (Completed)   Lumbar facet syndrome (Location of Primary Source of Pain) (Bilateral) (L>R) (Chronic)   Relevant Medications   orphenadrine (NORFLEX) injection 60 mg   ketorolac (TORADOL) injection 60 mg   cyclobenzaprine (FLEXERIL) 10 MG tablet   methylPREDNISolone (MEDROL) 4 MG TBPK tablet   orphenadrine (NORFLEX) 30 MG/ML injection (Completed)   ketorolac (TORADOL) 60 MG/2ML injection (Completed)   Other Relevant Orders   LUMBAR FACET(MEDIAL BRANCH NERVE BLOCK) MBNB   Musculoskeletal pain (Chronic)   Relevant Medications   cyclobenzaprine (FLEXERIL) 10 MG tablet   Neurogenic pain (Chronic)   Relevant Medications   gabapentin (NEURONTIN) 300 MG capsule   Osteoarthritis of hip (Bilateral) (Chronic)   Relevant Medications   orphenadrine (NORFLEX) injection 60 mg   ketorolac (TORADOL) injection 60 mg   cyclobenzaprine (FLEXERIL) 10 MG tablet   methylPREDNISolone (MEDROL) 4 MG TBPK tablet   orphenadrine (NORFLEX) 30 MG/ML injection (Completed)   ketorolac (TORADOL) 60 MG/2ML injection (Completed)   Other Relevant Orders   DG HIP UNILAT W OR W/O PELVIS 2-3 VIEWS LEFT (Completed)   DG HIP UNILAT W OR W/O PELVIS 2-3 VIEWS RIGHT (Completed)   Osteoarthritis of knee (Bilateral) (Chronic)   Relevant Medications   orphenadrine (NORFLEX) injection 60 mg   ketorolac (TORADOL) injection 60 mg   cyclobenzaprine (FLEXERIL) 10 MG tablet   methylPREDNISolone (MEDROL) 4 MG TBPK tablet   orphenadrine (NORFLEX) 30 MG/ML injection (Completed)   ketorolac (TORADOL) 60 MG/2ML injection (Completed)   Osteoarthritis of  sacroiliac joint (Bilateral) (Chronic)   Relevant Medications   orphenadrine (NORFLEX) injection 60 mg   ketorolac (TORADOL) injection  60 mg   cyclobenzaprine (FLEXERIL) 10 MG tablet   methylPREDNISolone (MEDROL) 4 MG TBPK tablet   orphenadrine (NORFLEX) 30 MG/ML injection (Completed)   ketorolac (TORADOL) 60 MG/2ML injection (Completed)   Other Relevant Orders   DG Si Joints (Completed)   SACROILIAC JOINT INJECTINS   Spasm of paraspinal muscle   Relevant Medications   cyclobenzaprine (FLEXERIL) 10 MG tablet       Pharmacotherapy (Medications Ordered): Meds ordered this encounter  Medications  . orphenadrine (NORFLEX) injection 60 mg    Sig:   . ketorolac (TORADOL) injection 60 mg    Sig:   . cyclobenzaprine (FLEXERIL) 10 MG tablet    Sig: Take 1 tablet (10 mg total) by mouth every 8 (eight) hours as needed for muscle spasms.    Dispense:  90 tablet    Refill:  2    Do not place this medication, or any other prescription from our practice, on "Automatic Refill". Patient may have prescription filled one day early if pharmacy is closed on scheduled refill date.  . methylPREDNISolone (MEDROL) 4 MG TBPK tablet    Sig: Follow package instructions.    Dispense:  21 tablet    Refill:  0    Do not place this medication, or any other prescription from our practice, on "Automatic Refill". Patient may have prescription filled one day early if pharmacy is closed on scheduled refill date.  . gabapentin (NEURONTIN) 300 MG capsule    Sig: Take 1-3 capsules (300-900 mg total) by mouth at bedtime.    Dispense:  90 capsule    Refill:  2    Do not place this medication, or any other prescription from our practice, on "Automatic Refill". Patient may have prescription filled one day early if pharmacy is closed on scheduled refill date.  . orphenadrine (NORFLEX) 30 MG/ML injection    Sig:     TICE, KORI: cabinet override  . ketorolac (TORADOL) 60 MG/2ML injection    Sig:     TICE, KORI: cabinet  override    Lab-work & Procedure Ordered: Orders Placed This Encounter  Procedures  . LUMBAR FACET(MEDIAL BRANCH NERVE BLOCK) MBNB  . SACROILIAC JOINT INJECTINS  . DG Lumbar Spine Complete W/Bend  . DG Si Joints  . DG HIP UNILAT W OR W/O PELVIS 2-3 VIEWS LEFT  . DG HIP UNILAT W OR W/O PELVIS 2-3 VIEWS RIGHT    Imaging Ordered: None  Interventional Therapies: Scheduled:  Diagnostic bilateral lumbar facet block + diagnostic bilateral sacroiliac joint block under fluoroscopic guidance and IV sedation, as soon as possible for her low back pain.    Considering:   1. Diagnostic bilateral lumbar facet block under fluoroscopic guidance and IV sedation.  2. Possible bilateral lumbar facet radiofrequency ablation depending on the results of the diagnostic injection.  3. Diagnostic bilateral sacroiliac joint block under fluoroscopic guidance and IV sedation.  4. Possible bilateral sacroiliac joint radiofrequency ablation depending on the results of the diagnostic injection.  5. Diagnostic bilateral hip joint injection under fluoroscopic guidance, with or without sedation.  6. Possible bilateral hip joint radiofrequency ablation depending on the results of the diagnostic injection.  7. Diagnostic bilateral knee joint injection, without fluoroscopy or IV sedation.  8. Possible series of Hyalgan knee injections.  9. Possible orthopedic surgical consult for knee evaluation.  10. Possible diagnostic bilateral genicular nerve block under fluoroscopic guidance and IV sedation for the knee pain.  11. Possible bilateral genicular nerve radiofrequency ablation under fluoroscopic guidance and  IV sedation depending on the results of the diagnostic genicular nerve blocks.    PRN Procedures:  None at this point.    Referral(s) or Consult(s): None at this time.  New Prescriptions   CYCLOBENZAPRINE (FLEXERIL) 10 MG TABLET    Take 1 tablet (10 mg total) by mouth every 8 (eight) hours as needed for muscle  spasms.   GABAPENTIN (NEURONTIN) 300 MG CAPSULE    Take 1-3 capsules (300-900 mg total) by mouth at bedtime.   METHYLPREDNISOLONE (MEDROL) 4 MG TBPK TABLET    Follow package instructions.    Medications administered during this visit: We administered orphenadrine and ketorolac.  Requested PM Follow-up: Return for Procedure (Scheduled), Keep previously scheduled appointment..  Future Appointments Date Time Provider [place]  05/07/2016 9:00 AM Milinda Pointer, MD [facility] None    Primary Care Physician: Chrisandra Carota, MD Location: [facility] Note by: Kathlen Brunswick. Dossie Arbour, M.D, DABA, DABAPM, DABPM, DABIPP, FIPP  Pain Score Disclaimer: We use the NRS-11 scale. This is a self-reported, subjective measurement of pain severity with only modest accuracy. It is used primarily to identify changes within a particular patient. It must be understood that outpatient pain scales are significantly less accurate that those used for research, where they can be applied under ideal controlled circumstances with minimal exposure to variables. In reality, the score is likely to be a combination of pain intensity and pain affect, where pain affect describes the degree of emotional arousal or changes in action readiness caused by the sensory experience of pain. Factors such as social and work situation, setting, emotional state, anxiety levels, expectation, and prior pain experience may influence pain perception and show large inter-individual differences that may also be affected by time variables.  Patient instructions provided during this appointment: There are no Patient Instructions on file for this visit.

## 2016-04-15 NOTE — Progress Notes (Signed)
Went to Urgent care 03-18-16, was given Toradol injection.  It helped for a short period of time.  Patient states she has the worst pain when she lays down.  Has not slept.  Pain takes her breath away when she turns or tries to get up.  Has had cramps in right thigh and swelling in legs and feet.  States she has always had back pain but this is so much different than it has been before.  Does not recall doing anything to cause the pain.   Safety precautions to be maintained throughout the outpatient stay will include: orient to surroundings, keep bed in low position, maintain call bell within reach at all times, provide assistance with transfer out of bed and ambulation.

## 2016-04-23 NOTE — Telephone Encounter (Signed)

## 2016-04-23 NOTE — Progress Notes (Signed)
Quick Note:  Results were reviewed and found to be: abnormal, but not significant   Review would suggest the patient to be a possible candidate for interventional pain management options  No acute injury or pathology identified ______

## 2016-05-07 ENCOUNTER — Encounter (INDEPENDENT_AMBULATORY_CARE_PROVIDER_SITE_OTHER): Payer: Self-pay

## 2016-05-07 ENCOUNTER — Ambulatory Visit: Payer: Worker's Compensation | Attending: Pain Medicine | Admitting: Pain Medicine

## 2016-05-07 ENCOUNTER — Other Ambulatory Visit
Admission: RE | Admit: 2016-05-07 | Discharge: 2016-05-07 | Disposition: A | Discharge status: Home / Self Care | Payer: Worker's Compensation | Source: Ambulatory Visit | Attending: Pain Medicine | Admitting: Pain Medicine

## 2016-05-07 ENCOUNTER — Encounter: Payer: Self-pay | Admitting: Pain Medicine

## 2016-05-07 VITALS — BP 123/67 | HR 80 | Temp 98.1°F | Resp 16 | Ht 67.0 in | Wt 215.0 lb

## 2016-05-07 DIAGNOSIS — M4316 Spondylolisthesis, lumbar region: Secondary | ICD-10-CM | POA: Insufficient documentation

## 2016-05-07 DIAGNOSIS — G4700 Insomnia, unspecified: Secondary | ICD-10-CM | POA: Diagnosis not present

## 2016-05-07 DIAGNOSIS — Z79891 Long term (current) use of opiate analgesic: Secondary | ICD-10-CM

## 2016-05-07 DIAGNOSIS — M533 Sacrococcygeal disorders, not elsewhere classified: Secondary | ICD-10-CM | POA: Insufficient documentation

## 2016-05-07 DIAGNOSIS — M170 Bilateral primary osteoarthritis of knee: Secondary | ICD-10-CM | POA: Insufficient documentation

## 2016-05-07 DIAGNOSIS — M160 Bilateral primary osteoarthritis of hip: Secondary | ICD-10-CM | POA: Insufficient documentation

## 2016-05-07 DIAGNOSIS — M4726 Other spondylosis with radiculopathy, lumbar region: Secondary | ICD-10-CM | POA: Diagnosis not present

## 2016-05-07 DIAGNOSIS — E538 Deficiency of other specified B group vitamins: Secondary | ICD-10-CM | POA: Diagnosis not present

## 2016-05-07 DIAGNOSIS — G4701 Insomnia due to medical condition: Secondary | ICD-10-CM

## 2016-05-07 DIAGNOSIS — M4807 Spinal stenosis, lumbosacral region: Secondary | ICD-10-CM | POA: Insufficient documentation

## 2016-05-07 DIAGNOSIS — G9619 Other disorders of meninges, not elsewhere classified: Secondary | ICD-10-CM | POA: Diagnosis not present

## 2016-05-07 DIAGNOSIS — M47817 Spondylosis without myelopathy or radiculopathy, lumbosacral region: Secondary | ICD-10-CM

## 2016-05-07 DIAGNOSIS — M4310 Spondylolisthesis, site unspecified: Secondary | ICD-10-CM

## 2016-05-07 DIAGNOSIS — M62830 Muscle spasm of back: Secondary | ICD-10-CM | POA: Insufficient documentation

## 2016-05-07 DIAGNOSIS — M25562 Pain in left knee: Secondary | ICD-10-CM

## 2016-05-07 DIAGNOSIS — M47818 Spondylosis without myelopathy or radiculopathy, sacral and sacrococcygeal region: Secondary | ICD-10-CM

## 2016-05-07 DIAGNOSIS — M5416 Radiculopathy, lumbar region: Secondary | ICD-10-CM

## 2016-05-07 DIAGNOSIS — M25561 Pain in right knee: Secondary | ICD-10-CM

## 2016-05-07 DIAGNOSIS — M4806 Spinal stenosis, lumbar region: Secondary | ICD-10-CM | POA: Diagnosis not present

## 2016-05-07 DIAGNOSIS — X58XXXA Exposure to other specified factors, initial encounter: Secondary | ICD-10-CM | POA: Diagnosis not present

## 2016-05-07 DIAGNOSIS — M545 Low back pain, unspecified: Secondary | ICD-10-CM

## 2016-05-07 DIAGNOSIS — E559 Vitamin D deficiency, unspecified: Secondary | ICD-10-CM | POA: Insufficient documentation

## 2016-05-07 DIAGNOSIS — M79606 Pain in leg, unspecified: Secondary | ICD-10-CM

## 2016-05-07 DIAGNOSIS — G8929 Other chronic pain: Secondary | ICD-10-CM | POA: Diagnosis not present

## 2016-05-07 DIAGNOSIS — Q762 Congenital spondylolisthesis: Secondary | ICD-10-CM

## 2016-05-07 DIAGNOSIS — S83206A Unspecified tear of unspecified meniscus, current injury, right knee, initial encounter: Secondary | ICD-10-CM | POA: Diagnosis not present

## 2016-05-07 DIAGNOSIS — M461 Sacroiliitis, not elsewhere classified: Secondary | ICD-10-CM

## 2016-05-07 DIAGNOSIS — M47816 Spondylosis without myelopathy or radiculopathy, lumbar region: Secondary | ICD-10-CM

## 2016-05-07 DIAGNOSIS — M7918 Myalgia, other site: Secondary | ICD-10-CM

## 2016-05-07 DIAGNOSIS — M791 Myalgia: Secondary | ICD-10-CM | POA: Insufficient documentation

## 2016-05-07 DIAGNOSIS — M549 Dorsalgia, unspecified: Secondary | ICD-10-CM | POA: Diagnosis present

## 2016-05-07 DIAGNOSIS — Z5181 Encounter for therapeutic drug level monitoring: Secondary | ICD-10-CM | POA: Diagnosis not present

## 2016-05-07 DIAGNOSIS — R0789 Other chest pain: Secondary | ICD-10-CM | POA: Diagnosis not present

## 2016-05-07 DIAGNOSIS — M48061 Spinal stenosis, lumbar region without neurogenic claudication: Secondary | ICD-10-CM

## 2016-05-07 DIAGNOSIS — M792 Neuralgia and neuritis, unspecified: Secondary | ICD-10-CM

## 2016-05-07 DIAGNOSIS — D509 Iron deficiency anemia, unspecified: Secondary | ICD-10-CM | POA: Insufficient documentation

## 2016-05-07 DIAGNOSIS — L259 Unspecified contact dermatitis, unspecified cause: Secondary | ICD-10-CM | POA: Diagnosis not present

## 2016-05-07 DIAGNOSIS — M539 Dorsopathy, unspecified: Secondary | ICD-10-CM

## 2016-05-07 DIAGNOSIS — M961 Postlaminectomy syndrome, not elsewhere classified: Secondary | ICD-10-CM

## 2016-05-07 DIAGNOSIS — M25559 Pain in unspecified hip: Secondary | ICD-10-CM

## 2016-05-07 LAB — VITAMIN B12: Vitamin B-12: 318 pg/mL (ref 180–914)

## 2016-05-07 MED ORDER — HYDROCODONE-ACETAMINOPHEN 10-325 MG PO TABS: 1.0000 | ORAL_TABLET | Freq: Two times a day (BID) | ORAL | Status: DC | PRN

## 2016-05-07 MED ORDER — MELATONIN 10 MG PO CAPS: 10.0000 mg | ORAL_CAPSULE | Freq: Every day | ORAL | Status: DC

## 2016-05-07 MED ORDER — CYCLOBENZAPRINE HCL 10 MG PO TABS: 10.0000 mg | ORAL_TABLET | Freq: Three times a day (TID) | ORAL | Status: DC | PRN

## 2016-05-07 MED ORDER — MELOXICAM 15 MG PO TABS: 15.0000 mg | ORAL_TABLET | Freq: Every morning | ORAL | Status: DC

## 2016-05-07 MED ORDER — VITAMIN D3 125 MCG (5000 UT) PO CAPS: 5000.0000 [IU] | ORAL_CAPSULE | Freq: Every day | ORAL | Status: DC

## 2016-05-07 MED ORDER — GABAPENTIN 300 MG PO CAPS: 300.0000 mg | ORAL_CAPSULE | Freq: Every day | ORAL | Status: DC

## 2016-05-07 NOTE — Progress Notes (Signed)
Patient's Name: Stephanie [name]  Patient type: Established  MRN: [id number]  Service setting: Ambulatory outpatient  DOB: Jan 04, 1961  Location: [facility]  DOS: 05/07/2016  Primary Care Physician: Chrisandra Carota, MD  Note by: Kathlen Brunswick. Dossie Arbour, M.D, DABA, [place], DABPM, DABIPP, FIPP  Referring Physician: Chrisandra Carota, MD  Specialty: Board-Certified Interventional Pain Management  Last Visit to Pain Management: 04/15/2016   Primary Reason(s) for Visit: Encounter for prescription drug management (Level of risk: moderate) CC: Back Pain   HPI  [name] is a 55 y.o. year old, female patient, who returns today as an established patient. 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 Her primarily concern today is the Back Pain   Pain Assessment: Self-Reported Pain Score: 3              Reported level is compatible with observation       Pain Descriptors / Indicators: Stabbing, Tightness Pain Frequency: Intermittent  The patient comes into the clinics today for pharmacological management of her chronic pain. I last saw this patient on 04/15/2016. The patient  reports that she does not use illicit drugs. Her body mass index is 33.67 kg/(m^2).  Date of Last Visit: 04/15/16 Service Provided on Last Visit: Evaluation (patient had Toradol and Norflex IM)  Controlled Substance Pharmacotherapy Assessment & REMS (Risk Evaluation and Mitigation Strategy)  Analgesic: Hydrocodone/APAP 10/325 one every 12 hours (20 mg/day) MME/day: 20 mg/day. Pill Count: Hydrocodone 10/325mg  #17 out of 60 Remaining. Filled 04-14-16. Pharmacokinetics: Onset of action (Liberation/Absorption): Within expected pharmacological parameters Time to Peak effect (Distribution): Timing and results are as within normal expected parameters Duration of action (Metabolism/Excretion): Within normal limits for medication Pharmacodynamics: Analgesic Effect: More than 50% Activity Facilitation: Medication(s) allow patient to sit, stand, walk, and do the basic ADLs Perceived Effectiveness: Described as relatively effective, allowing for increase in activities of daily living (ADL) Side-effects or Adverse reactions: None reported Monitoring: [place] PMP: Online review of the past 87-month period conducted. Compliant with practice rules and regulations Last UDS on record: TOXASSURE SELECT 13  Date Value Ref Range Status  02/12/2016 FINAL  Final    Comment:    ==================================================================== TOXASSURE SELECT 13  (MW) ==================================================================== Test                             Result  Flag       Units Drug Present and Declared for Prescription Verification   Hydrocodone                    2350         EXPECTED   ng/mg creat   Hydromorphone                  304          EXPECTED   ng/mg creat   Dihydrocodeine                 392          EXPECTED   ng/mg creat   Norhydrocodone                 2588         EXPECTED   ng/mg creat    Sources of hydrocodone include scheduled prescription    medications. Hydromorphone, dihydrocodeine and norhydrocodone are    expected metabolites of hydrocodone. Hydromorphone and    dihydrocodeine are also available as scheduled prescription    medications. ==================================================================== Test                      Result    Flag   Units      Ref Range   Creatinine              153              mg/dL      >=20 ==================================================================== Declared Medications:  The flagging and interpretation on this report are based on the  following declared medications.  Unexpected results may arise from  inaccuracies in the declared medications.  **Note: The testing scope of this panel includes these medications:  Hydrocodone (Norco)  **Note: The testing scope of this panel does not include following  reported medications:  Acetaminophen (Norco)  Cyclobenzaprine (Flexeril)  Melatonin  Meloxicam (Mobic)  Trazodone (Desyrel)  Vitamin D3 ==================================================================== For clinical consultation, please call [phone]. ====================================================================    UDS interpretation: Compliant          Medication Assessment Form: Reviewed. Patient indicates being compliant with therapy Treatment compliance: Compliant Risk Assessment: Aberrant Behavior: None observed today Substance Use  Disorder (SUD) Risk Level: Low Risk of opioid abuse or dependence: 0.7-3.0% with doses ? 36 MME/day and 6.1-26% with doses ? 120 MME/day. Opioid Risk Tool (ORT) Score:  0 Low Risk for SUD (Score <3) Depression Scale Score: PHQ-2: PHQ-2 Total Score: 0 No depression (0) PHQ-9: PHQ-9 Total Score: 0 No depression (0-4)  Pharmacologic Plan: No change in therapy, at this time  Laboratory Chemistry  Inflammation Markers Lab Results  Component Value Date   ESRSEDRATE 27 11/18/2015   CRP 0.7 11/18/2015    Renal Function Lab Results  Component Value Date   BUN 9 11/18/2015   CREATININE 0.59 11/18/2015   GFRAA >60 11/18/2015   GFRNONAA >60 11/18/2015    Hepatic Function Lab Results  Component Value Date   AST 18 11/18/2015   ALT 14 11/18/2015   ALBUMIN 4.0 11/18/2015    Electrolytes Lab Results  Component Value Date   NA 141 11/18/2015   K 4.2 11/18/2015   CL 109 11/18/2015   CALCIUM 9.0 11/18/2015   MG 2.0 11/18/2015    Pain Modulating Vitamins Lab Results  Component Value Date   VITAMINB12 318 05/07/2016    Coagulation Parameters Lab Results  Component Value  Date   PLT 298 04/10/2014    Note: Labs Reviewed.  Recent Diagnostic Imaging  Dg Lumbar Spine Complete W/bend  04/15/2016  CLINICAL DATA:  Low back and hip pain. EXAM: LUMBAR SPINE - COMPLETE WITH BENDING VIEWS COMPARISON:  MRI pass 01/23/2014.  Pelvis series 01/17/2014 . FINDINGS: Lumbar spine numbered as per prior MRI. Prior posterior interbody fusion L5-S1 and laminectomy with stable approximately 10 mm anterolisthesis L5-S1. Hardware intact. 5 mm anterolisthesis L4-L5 noted on today's exam. No acute or focal bony abnormalities identified. Diffuse degenerative change. Pelvic calcifications consistent phleboliths . IMPRESSION: Prior posterior interbody fusion L5-S1 and laminectomy with stable approximately 10 mm anterolisthesis L5-S1. 5 mm anterolisthesis L4 on L5 noted on today's exam. No acute or focal bony  lesions are identified. Electronically Signed   By: Marcello Moores  Register   On: 04/15/2016 09:32   Dg Si Joints  04/15/2016  CLINICAL DATA:  Chronic pain. EXAM: BILATERAL SACROILIAC JOINTS - 3+ VIEW COMPARISON:  MRI 01/23/2014.  AP pelvis 01/17/2014. FINDINGS: L5-S1 fusion. Hardware intact. Degenerative changes SI joints. No acute bony abnormality. Surgical sutures right abdomen. IMPRESSION: 1.  L5-S1 fusion.  Hardware intact. 2.  Degenerative changes SI joints.  No acute abnormality . Electronically Signed   By: Marcello Moores  Register   On: 04/15/2016 09:33   Dg Hip Unilat W Or W/o Pelvis 2-3 Views Left  04/15/2016  CLINICAL DATA:  Chronic back pain.  Hip pain. EXAM: DG HIP (WITH OR WITHOUT PELVIS) 2-3V LEFT COMPARISON:  MRI 01/24/2016.  Pelvic series 01/18/2016. FINDINGS: L5-S1 posterior interbody fusion. Hardware intact. No acute bony abnormality identified. Minimal degenerative changes noted both hips. IMPRESSION: No acute abnormality. Minimal degenerative changes noted both hips. Prior L5-S1 posterior interbody fusion. Electronically Signed   By: Marcello Moores  Register   On: 04/15/2016 09:35   Dg Hip Unilat W Or W/o Pelvis 2-3 Views Right  04/15/2016  CLINICAL DATA:  Chronic pain.  Hip pain. EXAM: DG HIP (WITH OR WITHOUT PELVIS) 2-3V RIGHT COMPARISON:  MRI 03/24/ 2015.  Pelvic series 01/17/2014. FINDINGS: No acute bony abnormality identified. Minimal degenerative change present about the right hip. Prior L5-S1 posterior interbody fusion. Pelvic calcifications consistent with phleboliths. IMPRESSION: No acute abnormality.  Minimal degenerative change. Electronically Signed   By: Marcello Moores  Register   On: 04/15/2016 09:36    Meds  The patient has a current medication list which includes the following prescription(s): vitamin d3, cyclobenzaprine, gabapentin, hydrocodone-acetaminophen, hydrocodone-acetaminophen, hydrocodone-acetaminophen, melatonin, and meloxicam.  No current outpatient prescriptions on file prior to  visit.   No current facility-administered medications on file prior to visit.    ROS  Constitutional: Denies any fever or chills Gastrointestinal: No reported hemesis, hematochezia, vomiting, or acute GI distress Musculoskeletal: Denies any acute onset joint swelling, redness, loss of ROM, or weakness Neurological: No reported episodes of acute onset apraxia, aphasia, dysarthria, agnosia, amnesia, paralysis, loss of coordination, or loss of consciousness  Allergies  [name] is allergic to phenergan.  [place]  Medical:  [name]  has a past medical history of Lumbar radicular pain (08/22/2015). Family: family history is not on file. Surgical:  has past surgical history that includes Back surgery; Appendectomy; Cholecystectomy; Cesarean section; and Bunionectomy. Tobacco:  reports that she has never smoked. She has never used smokeless tobacco. Alcohol:  reports that she does not drink alcohol. Drug:  reports that she does not use illicit drugs.  Constitutional Exam  Vitals: Blood pressure 123/67, pulse 80, temperature 98.1 F (36.7 C), temperature source Oral, resp.  rate 16, height 5\' 7"  (1.702 m), weight 215 lb (97.523 kg), last menstrual period 04/16/2016, SpO2 96 %. General appearance: Well nourished, well developed, and well hydrated. In no acute distress Calculated BMI/Body habitus: Body mass index is 33.67 kg/(m^2). (30-34.9 kg/m2) Obese (Class I) - 68% higher incidence of chronic pain Psych/Mental status: Alert and oriented x 3 (person, place, & time) Eyes: PERLA Respiratory: No evidence of acute respiratory distress  Cervical Spine Exam  Inspection: No masses, redness, or swelling Alignment: Symmetrical ROM: Functional: ROM is within functional limits [facility]) Stability: No instability detected Muscle strength & Tone: Functionally intact Sensory: Unimpaired Palpation: No complaints of tenderness  Upper Extremity (UE) Exam    Side: Right upper extremity  Side: Left  upper extremity  Inspection: No masses, redness, swelling, or asymmetry  Inspection: No masses, redness, swelling, or asymmetry  ROM:  ROM:  Functional: ROM is within functional limits [facility])        Functional: ROM is within functional limits [facility])        Muscle strength & Tone: Functionally intact  Muscle strength & Tone: Functionally intact  Sensory: Unimpaired  Sensory: Unimpaired  Palpation: No complaints of tenderness  Palpation: No complaints of tenderness   Thoracic Spine Exam  Inspection: No masses, redness, or swelling Alignment: Symmetrical ROM: Functional: ROM is within functional limits [facility]) Stability: No instability detected Sensory: Unimpaired Muscle strength & Tone: Functionally intact Palpation: No complaints of tenderness  Lumbar Spine Exam  Inspection: No masses, redness, or swelling Alignment: Symmetrical ROM: Functional: ROM is within functional limits [facility]) Stability: No instability detected Muscle strength & Tone: Functionally intact Sensory: Unimpaired Palpation: No complaints of tenderness Provocative Tests: Lumbar Hyperextension and rotation test: deferred       Patrick's Maneuver: deferred              Gait & Posture Assessment  Ambulation: Unassisted Gait: Unaffected Posture: WNL   Lower Extremity Exam    Side: Right lower extremity  Side: Left lower extremity  Inspection: No masses, redness, swelling, or asymmetry ROM:  Inspection: No masses, redness, swelling, or asymmetry ROM:  Functional: ROM is within functional limits [facility])        Functional: ROM is within functional limits [facility])        Muscle strength & Tone: Functionally intact  Muscle strength & Tone: Functionally intact  Sensory: Unimpaired  Sensory: Unimpaired  Palpation: No complaints of tenderness  Palpation: No complaints of tenderness   Assessment & Plan  Primary Diagnosis & Pertinent Problem List: The primary encounter diagnosis was Chronic pain. 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  Visit Diagnosis: 1. Chronic pain   2. Encounter for therapeutic drug level monitoring   3. Long term current use of opiate analgesic   4. Insomnia secondary to chronic pain   5. Musculoskeletal pain   6. Spasm of paraspinal muscle   7. Neurogenic pain   8. Spondylolisthesis of lumbar region   9. Vitamin D deficiency   10. B12 deficiency   11.  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 Chronic hip pain, unspecified laterality   13. Chronic knee pain (Location of Tertiary source of pain) (Bilateral) (R>L)   14. Chronic low back pain (Location of Primary Source of Pain) (Bilateral) (L>R)   15. Chronic pain of lower extremity, unspecified laterality   16. Chronic lumbar radicular pain (Right) (S1)   17. Chronic sacroiliac joint pain (Bilateral)   18. Failed back surgical  syndrome (Laminectomy and PLIF at L5-S1) (2010, by Dr. Evlyn Clines, [place])   45. Lumbar facet syndrome (Location of Primary Source of Pain) (Bilateral) (L>R)   20. 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 fusion)   21. 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)   22. Lumbar spondylosis, unspecified spinal osteoarthritis   23. Primary osteoarthritis of both hips   24. Primary osteoarthritis of both knees   25. Osteoarthritis of sacroiliac joint (Bilateral)     Problems updated and reviewed during this visit: Problem  Acute Low Back Pain (Resolved)    Problem-specific Plan(s): No problem-specific assessment & plan notes found for this encounter.  No new assessment & plan notes have been filed under this hospital service since the last note was generated. 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) (Chronic)   Relevant Orders   LUMBAR EPIDURAL STEROID INJECTION   Chronic hip pain (Bilateral) (Chronic)   Relevant Medications   cyclobenzaprine (FLEXERIL) 10 MG tablet   gabapentin (NEURONTIN) 300 MG capsule   meloxicam (MOBIC) 15 MG tablet   HYDROcodone-acetaminophen (NORCO) 10-325 MG tablet   HYDROcodone-acetaminophen (NORCO) 10-325 MG tablet   HYDROcodone-acetaminophen (NORCO) 10-325 MG tablet   Other Relevant Orders   HIP INJECTION   Chronic knee pain (Location of Tertiary source of pain) (Bilateral) (R>L) (Chronic)   Relevant Medications   cyclobenzaprine (FLEXERIL) 10 MG tablet   gabapentin (NEURONTIN) 300 MG capsule   meloxicam (MOBIC) 15 MG tablet   HYDROcodone-acetaminophen (NORCO) 10-325 MG tablet   HYDROcodone-acetaminophen (NORCO) 10-325 MG tablet   HYDROcodone-acetaminophen (NORCO) 10-325 MG tablet   Other Relevant Orders   KNEE INJECTION   Chronic low back pain (Location of Primary Source of Pain) (Bilateral) (L>R) (Chronic)   Relevant  Medications   cyclobenzaprine (FLEXERIL) 10 MG tablet   meloxicam (MOBIC) 15 MG tablet   HYDROcodone-acetaminophen (NORCO) 10-325 MG tablet   HYDROcodone-acetaminophen (NORCO) 10-325 MG tablet   HYDROcodone-acetaminophen (NORCO) 10-325 MG tablet   Other Relevant Orders   LUMBAR FACET(MEDIAL BRANCH NERVE BLOCK) MBNB   Chronic lower extremity pain (Location of Secondary source of pain) (Bilateral) (L>R) (Chronic)   Relevant Orders   LUMBAR EPIDURAL STEROID INJECTION   Lumbar Transforaminal epidural without steroid   Chronic lumbar radicular pain (Right) (S1) (Chronic)   Relevant Orders   LUMBAR EPIDURAL STEROID INJECTION   Chronic pain - Primary (Chronic)   Relevant Medications   cyclobenzaprine (FLEXERIL) 10 MG tablet   gabapentin (NEURONTIN) 300 MG  capsule   meloxicam (MOBIC) 15 MG tablet   HYDROcodone-acetaminophen (NORCO) 10-325 MG tablet   HYDROcodone-acetaminophen (NORCO) 10-325 MG tablet   HYDROcodone-acetaminophen (NORCO) 10-325 MG tablet   Chronic sacroiliac joint pain (Bilateral) (Chronic)   Relevant Medications   cyclobenzaprine (FLEXERIL) 10 MG tablet   meloxicam (MOBIC) 15 MG tablet   HYDROcodone-acetaminophen (NORCO) 10-325 MG tablet   HYDROcodone-acetaminophen (NORCO) 10-325 MG tablet   HYDROcodone-acetaminophen (NORCO) 10-325 MG tablet   Other Relevant Orders   SACROILIAC JOINT INJECTINS   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 fusion) (Chronic)   Relevant Orders   LUMBAR FACET(MEDIAL BRANCH NERVE BLOCK) MBNB   Lumbar  facet syndrome (Location of Primary Source of Pain) (Bilateral) (L>R) (Chronic)   Relevant Medications   cyclobenzaprine (FLEXERIL) 10 MG tablet   meloxicam (MOBIC) 15 MG tablet   HYDROcodone-acetaminophen (NORCO) 10-325 MG tablet   HYDROcodone-acetaminophen (NORCO) 10-325 MG tablet   HYDROcodone-acetaminophen (NORCO) 10-325 MG tablet   Other Relevant Orders   LUMBAR FACET(MEDIAL BRANCH NERVE BLOCK) MBNB   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) (Chronic)   Relevant Orders   Lumbar Transforaminal epidural without steroid   Lumbar spondylosis (Chronic)   Relevant Medications   cyclobenzaprine (FLEXERIL) 10 MG tablet   meloxicam (MOBIC) 15 MG tablet   HYDROcodone-acetaminophen (NORCO) 10-325 MG tablet   HYDROcodone-acetaminophen (NORCO) 10-325 MG tablet   HYDROcodone-acetaminophen (NORCO) 10-325 MG tablet   Other Relevant Orders   LUMBAR FACET(MEDIAL BRANCH NERVE BLOCK) MBNB   LUMBAR EPIDURAL STEROID INJECTION   Lumbar Transforaminal epidural without steroid   Musculoskeletal pain (Chronic)   Relevant Medications   cyclobenzaprine (FLEXERIL) 10 MG tablet   Neurogenic pain (Chronic)   Relevant Medications   gabapentin (NEURONTIN) 300 MG capsule   Osteoarthritis of hip (Bilateral) (Chronic)   Relevant Medications   cyclobenzaprine (FLEXERIL) 10 MG tablet   meloxicam (MOBIC) 15 MG tablet   HYDROcodone-acetaminophen (NORCO) 10-325 MG tablet   HYDROcodone-acetaminophen (NORCO) 10-325 MG tablet   HYDROcodone-acetaminophen (NORCO) 10-325 MG tablet   Other Relevant Orders   HIP INJECTION   Osteoarthritis of knee (Bilateral) (Chronic)   Relevant Medications   cyclobenzaprine (FLEXERIL) 10 MG tablet   meloxicam (MOBIC) 15 MG tablet   HYDROcodone-acetaminophen (NORCO) 10-325 MG tablet   HYDROcodone-acetaminophen (NORCO) 10-325 MG tablet   HYDROcodone-acetaminophen (NORCO) 10-325 MG tablet   Other Relevant Orders   KNEE INJECTION   GENICULAR NERVE BLOCK   Osteoarthritis  of sacroiliac joint (Bilateral) (Chronic)   Relevant Medications   cyclobenzaprine (FLEXERIL) 10 MG tablet   meloxicam (MOBIC) 15 MG tablet   HYDROcodone-acetaminophen (NORCO) 10-325 MG tablet   HYDROcodone-acetaminophen (NORCO) 10-325 MG tablet   HYDROcodone-acetaminophen (NORCO) 10-325 MG tablet   Other Relevant Orders   SACROILIAC JOINT INJECTINS   Spasm of paraspinal muscle   Relevant Medications   cyclobenzaprine (FLEXERIL) 10 MG tablet   Spondylolisthesis of lumbar region (Chronic)   Relevant Medications   meloxicam (MOBIC) 15 MG tablet   Other Relevant Orders   LUMBAR FACET(MEDIAL BRANCH NERVE BLOCK) MBNB     Medium   Encounter for  therapeutic drug level monitoring   Long term current use of opiate analgesic (Chronic)   Relevant Orders   ToxASSURE Select 13 (MW), Urine     Low   B12 deficiency   Relevant Orders   Vitamin B12 (Completed)   Insomnia secondary to chronic pain (Chronic)   Relevant Medications   Melatonin 10 MG CAPS   cyclobenzaprine (FLEXERIL) 10 MG tablet   gabapentin (NEURONTIN) 300 MG capsule   meloxicam (MOBIC) 15 MG tablet   HYDROcodone-acetaminophen (NORCO) 10-325 MG tablet   HYDROcodone-acetaminophen (NORCO) 10-325 MG tablet   HYDROcodone-acetaminophen (NORCO) 10-325 MG tablet   Vitamin D deficiency   Relevant Medications   Cholecalciferol (VITAMIN D3) 5000 units CAPS   Other Relevant Orders   25-Hydroxyvitamin D Lcms D2+D3       Pharmacotherapy (Medications Ordered): Meds ordered this encounter  Medications  . Melatonin 10 MG CAPS    Sig: Take 10-20 mg by mouth at bedtime.    Dispense:  60 capsule    Refill:  2    Do not place this medication, or any other prescription from our practice, on "Automatic Refill". Patient may have prescription filled one day early if pharmacy is closed on scheduled refill date.  . cyclobenzaprine (FLEXERIL) 10 MG tablet    Sig: Take 1 tablet (10 mg total) by mouth every 8 (eight) hours as needed for muscle  spasms.    Dispense:  90 tablet    Refill:  2    Do not place this medication, or any other prescription from our practice, on "Automatic Refill". Patient may have prescription filled one day early if pharmacy is closed on scheduled refill date.  . gabapentin (NEURONTIN) 300 MG capsule    Sig: Take 1-3 capsules (300-900 mg total) by mouth at bedtime.    Dispense:  90 capsule    Refill:  2    Do not place this medication, or any other prescription from our practice, on "Automatic Refill". Patient may have prescription filled one day early if pharmacy is closed on scheduled refill date.  . meloxicam (MOBIC) 15 MG tablet    Sig: Take 1 tablet (15 mg total) by mouth every morning.    Dispense:  30 tablet    Refill:  2    Do not place this medication, or any other prescription from our practice, on "Automatic Refill". Patient may have prescription filled one day early if pharmacy is closed on scheduled refill date.  . Cholecalciferol (VITAMIN D3) 5000 units CAPS    Sig: Take 1 capsule (5,000 Units total) by mouth daily with breakfast.    Dispense:  30 capsule    Refill:  2    Do not add this medication to the electronic "Automatic Refill" notification system. Patient may have prescription filled one day early if pharmacy is closed on scheduled refill date.  Marland Kitchen HYDROcodone-acetaminophen (NORCO) 10-325 MG tablet    Sig: Take 1 tablet by mouth every 12 (twelve) hours as needed for severe pain.    Dispense:  60 tablet    Refill:  0    Do not place this medication, or any other prescription from our practice, on "Automatic Refill". Patient may have prescription filled one day early if pharmacy is closed on scheduled refill date. Do not fill until: 05/19/16 To last until: 06/18/16  . HYDROcodone-acetaminophen (NORCO) 10-325 MG tablet    Sig: Take 1 tablet by mouth every 12 (twelve) hours as needed for severe pain.    Dispense:  60 tablet    Refill:  0    Do not place this medication, or any other  prescription from our practice, on "Automatic Refill". Patient may have prescription filled one day early if pharmacy is closed on scheduled refill date. Do not fill until: 06/18/16 To last until: 07/18/16  . HYDROcodone-acetaminophen (NORCO) 10-325 MG tablet    Sig: Take 1 tablet by mouth every 12 (twelve) hours as needed for severe pain.    Dispense:  60 tablet    Refill:  0    Do not place this medication, or any other prescription from our practice, on "Automatic Refill". Patient may have prescription filled one day early if pharmacy is closed on scheduled refill date. Do not fill until: 07/18/16 To last until: 08/17/16    [facility] Procedure Ordered: Orders Placed This Encounter  Procedures  . LUMBAR FACET(MEDIAL BRANCH NERVE BLOCK) MBNB  . SACROILIAC JOINT INJECTINS  . HIP INJECTION  . KNEE INJECTION  . GENICULAR NERVE BLOCK  . LUMBAR EPIDURAL STEROID INJECTION  . Lumbar Transforaminal epidural without steroid  . ToxASSURE Select 13 (MW), Urine  . Vitamin B12  . 25-Hydroxyvitamin D Lcms D2+D3    Imaging Ordered: None  Interventional Therapies: Scheduled: Diagnostic bilateral lumbar facet block + diagnostic bilateral sacroiliac joint block under fluoroscopic guidance and IV sedation, as soon as possible for her low back pain.    Considering: 1. Diagnostic bilateral lumbar facet block. 2. Possible bilateral lumbar facet RFA. 3. Diagnostic Caudal ESI. 4. Diagnostic Bilateral L4-5 & L5-S1Lumbar TFESI. 5. Diagnostic bilateral sacroiliac joint block. 6. Possible bilateral sacroiliac joint RFA. 7. Diagnostic bilateral hip joint injection. 8. Possible bilateral hip joint RFA. 9. Diagnostic bilateral knee joint injection. 10. Possible series of Hyalgan knee injections. 11. Diagnostic bilateral genicular nerve block. 12. Possible bilateral genicular nerve RFA.   PRN Procedures: 1. Diagnostic bilateral lumbar facet block. 2. Diagnostic Caudal ESI. 3. Diagnostic  Bilateral L4-5 & L5-S1Lumbar TFESI. 4. Diagnostic bilateral sacroiliac joint block. 5. Diagnostic bilateral hip joint injection. 6. Diagnostic bilateral knee joint injection. 7. Possible series of Hyalgan knee injections. 8. Diagnostic bilateral genicular nerve block.       Referral(s) or Consult(s): None at this time.  New Prescriptions   No medications on file    Medications administered during this visit: [name] had no medications administered during this visit.  Requested PM Follow-up: Return in 3 months (on 08/03/2016) for (3-Mo), Med-Mgmt, (PRN) Procedure.  Future Appointments Date Time Provider [place]  08/03/2016 8:00 AM Milinda Pointer, MD [facility] None    Primary Care Physician: Chrisandra Carota, MD Location: [facility] Note by: Kathlen Brunswick. Dossie Arbour, M.D, DABA, DABAPM, DABPM, DABIPP, FIPP  Pain Score Disclaimer: We use the NRS-11 scale. This is a self-reported, subjective measurement of pain severity with only modest accuracy. It is used primarily to identify changes within a particular patient. It must be understood that outpatient pain scales are significantly less accurate that those used for research, where they can be applied under ideal controlled circumstances with minimal exposure to variables. In reality, the score is likely to be a combination of pain intensity and pain affect, where pain affect describes the degree of emotional arousal or changes in action readiness caused by the sensory experience of pain. Factors such as social and work situation, setting, emotional state, anxiety levels, expectation, and prior pain experience may influence pain perception and show large inter-individual differences that may also be affected by time variables.  Patient instructions provided during this appointment: Patient Instructions  Please get your blood drawn for labs today if possible.

## 2016-05-07 NOTE — Progress Notes (Signed)
Hydrocodone 10/325mg  #17 out of 60  Remaining. Filled 04-14-16.

## 2016-05-07 NOTE — Patient Instructions (Signed)
Please get your blood drawn for labs today if possible.

## 2016-05-07 NOTE — Progress Notes (Signed)
Safety precautions to be maintained throughout the outpatient stay will include: orient to surroundings, keep bed in low position, maintain call bell within reach at all times, provide assistance with transfer out of bed and ambulation.  

## 2016-05-11 ENCOUNTER — Encounter: Payer: Self-pay | Admitting: Pain Medicine

## 2016-05-11 LAB — 25-HYDROXY VITAMIN D LCMS D2+D3: 25-Hydroxy, Vitamin D-3: 34 ng/mL

## 2016-05-11 LAB — 25-HYDROXYVITAMIN D LCMS D2+D3: 25-HYDROXY, VITAMIN D: 34 ng/mL

## 2016-05-15 LAB — TOXASSURE SELECT 13 (MW), URINE: PDF: 0

## 2016-06-08 ENCOUNTER — Other Ambulatory Visit: Payer: Self-pay | Admitting: Pain Medicine

## 2016-06-08 DIAGNOSIS — G4701 Insomnia due to medical condition: Secondary | ICD-10-CM

## 2016-06-08 DIAGNOSIS — G8929 Other chronic pain: Principal | ICD-10-CM

## 2016-06-29 ENCOUNTER — Other Ambulatory Visit: Payer: Self-pay | Admitting: Pain Medicine

## 2016-06-29 DIAGNOSIS — G8929 Other chronic pain: Principal | ICD-10-CM

## 2016-06-29 DIAGNOSIS — G4701 Insomnia due to medical condition: Secondary | ICD-10-CM

## 2016-07-04 ENCOUNTER — Other Ambulatory Visit: Payer: Self-pay | Admitting: Pain Medicine

## 2016-07-04 DIAGNOSIS — G8929 Other chronic pain: Principal | ICD-10-CM

## 2016-07-04 DIAGNOSIS — G4701 Insomnia due to medical condition: Secondary | ICD-10-CM

## 2016-08-03 ENCOUNTER — Ambulatory Visit: Payer: Worker's Compensation | Attending: Pain Medicine | Admitting: Pain Medicine

## 2016-08-03 ENCOUNTER — Encounter: Payer: Self-pay | Admitting: Pain Medicine

## 2016-08-03 VITALS — BP 127/93 | HR 87 | Temp 97.9°F | Resp 16 | Ht 67.0 in | Wt 215.0 lb

## 2016-08-03 DIAGNOSIS — Z79891 Long term (current) use of opiate analgesic: Secondary | ICD-10-CM | POA: Diagnosis not present

## 2016-08-03 DIAGNOSIS — Z9049 Acquired absence of other specified parts of digestive tract: Secondary | ICD-10-CM | POA: Diagnosis not present

## 2016-08-03 DIAGNOSIS — F1190 Opioid use, unspecified, uncomplicated: Secondary | ICD-10-CM

## 2016-08-03 DIAGNOSIS — G8929 Other chronic pain: Secondary | ICD-10-CM | POA: Diagnosis not present

## 2016-08-03 DIAGNOSIS — M79604 Pain in right leg: Secondary | ICD-10-CM | POA: Diagnosis not present

## 2016-08-03 DIAGNOSIS — Z823 Family history of stroke: Secondary | ICD-10-CM | POA: Diagnosis not present

## 2016-08-03 DIAGNOSIS — Z825 Family history of asthma and other chronic lower respiratory diseases: Secondary | ICD-10-CM | POA: Insufficient documentation

## 2016-08-03 DIAGNOSIS — Z981 Arthrodesis status: Secondary | ICD-10-CM | POA: Insufficient documentation

## 2016-08-03 DIAGNOSIS — M5442 Lumbago with sciatica, left side: Secondary | ICD-10-CM | POA: Diagnosis not present

## 2016-08-03 DIAGNOSIS — M79605 Pain in left leg: Secondary | ICD-10-CM | POA: Insufficient documentation

## 2016-08-03 DIAGNOSIS — M5441 Lumbago with sciatica, right side: Secondary | ICD-10-CM

## 2016-08-03 DIAGNOSIS — M1288 Other specific arthropathies, not elsewhere classified, other specified site: Secondary | ICD-10-CM | POA: Insufficient documentation

## 2016-08-03 DIAGNOSIS — G4701 Insomnia due to medical condition: Secondary | ICD-10-CM | POA: Diagnosis not present

## 2016-08-03 DIAGNOSIS — M961 Postlaminectomy syndrome, not elsewhere classified: Secondary | ICD-10-CM | POA: Diagnosis not present

## 2016-08-03 DIAGNOSIS — M62830 Muscle spasm of back: Secondary | ICD-10-CM | POA: Insufficient documentation

## 2016-08-03 DIAGNOSIS — M7918 Myalgia, other site: Secondary | ICD-10-CM

## 2016-08-03 DIAGNOSIS — M533 Sacrococcygeal disorders, not elsewhere classified: Secondary | ICD-10-CM | POA: Insufficient documentation

## 2016-08-03 DIAGNOSIS — M4316 Spondylolisthesis, lumbar region: Secondary | ICD-10-CM | POA: Insufficient documentation

## 2016-08-03 DIAGNOSIS — M791 Myalgia: Secondary | ICD-10-CM

## 2016-08-03 DIAGNOSIS — M792 Neuralgia and neuritis, unspecified: Secondary | ICD-10-CM

## 2016-08-03 DIAGNOSIS — M47816 Spondylosis without myelopathy or radiculopathy, lumbar region: Secondary | ICD-10-CM

## 2016-08-03 MED ORDER — MELATONIN 10 MG PO CAPS: 10.0000 mg | ORAL_CAPSULE | Freq: Every day | ORAL | 2 refills | Status: DC

## 2016-08-03 MED ORDER — HYDROCODONE-ACETAMINOPHEN 10-325 MG PO TABS: 1.0000 | ORAL_TABLET | Freq: Two times a day (BID) | ORAL | 0 refills | Status: DC | PRN

## 2016-08-03 MED ORDER — MELOXICAM 15 MG PO TABS: 15.0000 mg | ORAL_TABLET | Freq: Every morning | ORAL | 2 refills | Status: DC

## 2016-08-03 MED ORDER — GABAPENTIN 300 MG PO CAPS: 300.0000 mg | ORAL_CAPSULE | Freq: Every day | ORAL | 2 refills | Status: DC

## 2016-08-03 MED ORDER — CYCLOBENZAPRINE HCL 10 MG PO TABS: 10.0000 mg | ORAL_TABLET | Freq: Three times a day (TID) | ORAL | 2 refills | Status: DC | PRN

## 2016-08-03 NOTE — Progress Notes (Signed)
Safety precautions to be maintained throughout the outpatient stay will include: orient to surroundings, keep bed in low position, maintain call bell within reach at all times, provide assistance with transfer out of bed and ambulation.  Went to ED in 5/17 for pain  Pain medications- hydrocodone 10-325mg  13 remaining filled 07/10/16

## 2016-08-03 NOTE — Patient Instructions (Addendum)
GENERAL RISKS AND COMPLICATIONS  What are the risk, side effects and possible complications? Generally speaking, most procedures are safe.  However, with any procedure there are risks, side effects, and the possibility of complications.  The risks and complications are dependent upon the sites that are lesioned, or the type of nerve block to be performed.  The closer the procedure is to the spine, the more serious the risks are.  Great care is taken when placing the radio frequency needles, block needles or lesioning probes, but sometimes complications can occur. 1. Infection: Any time there is an injection through the skin, there is a risk of infection.  This is why sterile conditions are used for these blocks.  There are four possible types of infection. 1. Localized skin infection. 2. Central Nervous System Infection-This can be in the form of Meningitis, which can be deadly. 3. Epidural Infections-This can be in the form of an epidural abscess, which can cause pressure inside of the spine, causing compression of the spinal cord with subsequent paralysis. This would require an emergency surgery to decompress, and there are no guarantees that the patient would recover from the paralysis. 4. Discitis-This is an infection of the intervertebral discs.  It occurs in about 1% of discography procedures.  It is difficult to treat and it may lead to surgery.        2. Pain: the needles have to go through skin and soft tissues, will cause soreness.       3. Damage to internal structures:  The nerves to be lesioned may be near blood vessels or    other nerves which can be potentially damaged.       4. Bleeding: Bleeding is more common if the patient is taking blood thinners such as  aspirin, Coumadin, Ticiid, Plavix, etc., or if he/she have some genetic predisposition  such as hemophilia. Bleeding into the spinal canal can cause compression of the spinal  cord with subsequent paralysis.  This would require an  emergency surgery to  decompress and there are no guarantees that the patient would recover from the  paralysis.       5. Pneumothorax:  Puncturing of a lung is a possibility, every time a needle is introduced in  the area of the chest or upper back.  Pneumothorax refers to free air around the  collapsed lung(s), inside of the thoracic cavity (chest cavity).  Another two possible  complications related to a similar event would include: Hemothorax and Chylothorax.   These are variations of the Pneumothorax, where instead of air around the collapsed  lung(s), you may have blood or chyle, respectively.       6. Spinal headaches: They may occur with any procedures in the area of the spine.       7. Persistent CSF (Cerebro-Spinal Fluid) leakage: This is a rare problem, but may occur  with prolonged intrathecal or epidural catheters either due to the formation of a fistulous  track or a dural tear.       8. Nerve damage: By working so close to the spinal cord, there is always a possibility of  nerve damage, which could be as serious as a permanent spinal cord injury with  paralysis.       9. Death:  Although rare, severe deadly allergic reactions known as "Anaphylactic  reaction" can occur to any of the medications used.      10. Worsening of the symptoms:  We can always make thing worse.    What are the chances of something like this happening? Chances of any of this occuring are extremely low.  By statistics, you have more of a chance of getting killed in a motor vehicle accident: while driving to the hospital than any of the above occurring .  Nevertheless, you should be aware that they are possibilities.  In general, it is similar to taking a shower.  Everybody knows that you can slip, hit your head and get killed.  Does that mean that you should not shower again?  Nevertheless always keep in mind that statistics do not mean anything if you happen to be on the wrong side of them.  Even if a procedure has a 1  (one) in a 1,000,000 (million) chance of going wrong, it you happen to be that one..Also, keep in mind that by statistics, you have more of a chance of having something go wrong when taking medications.  Who should not have this procedure? If you are on a blood thinning medication (e.g. Coumadin, Plavix, see list of "Blood Thinners"), or if you have an active infection going on, you should not have the procedure.  If you are taking any blood thinners, please inform your physician.  How should I prepare for this procedure?  Do not eat or drink anything at least six hours prior to the procedure.  Bring a driver with you .  It cannot be a taxi.  Come accompanied by an adult that can drive you back, and that is strong enough to help you if your legs get weak or numb from the local anesthetic.  Take all of your medicines the morning of the procedure with just enough water to swallow them.  If you have diabetes, make sure that you are scheduled to have your procedure done first thing in the morning, whenever possible.  If you have diabetes, take only half of your insulin dose and notify our nurse that you have done so as soon as you arrive at the clinic.  If you are diabetic, but only take blood sugar pills (oral hypoglycemic), then do not take them on the morning of your procedure.  You may take them after you have had the procedure.  Do not take aspirin or any aspirin-containing medications, at least eleven (11) days prior to the procedure.  They may prolong bleeding.  Wear loose fitting clothing that may be easy to take off and that you would not mind if it got stained with Betadine or blood.  Do not wear any jewelry or perfume  Remove any nail coloring.  It will interfere with some of our monitoring equipment.  NOTE: Remember that this is not meant to be interpreted as a complete list of all possible complications.  Unforeseen problems may occur.  BLOOD THINNERS The following drugs  contain aspirin or other products, which can cause increased bleeding during surgery and should not be taken for 2 weeks prior to and 1 week after surgery.  If you should need take something for relief of minor pain, you may take acetaminophen which is found in Tylenol,m Datril, Anacin-3 and Panadol. It is not blood thinner. The products listed below are.  Do not take any of the products listed below in addition to any listed on your instruction sheet.  A.P.C or A.P.C with Codeine Codeine Phosphate Capsules #3 Ibuprofen Ridaura  ABC compound Congesprin Imuran rimadil  Advil Cope Indocin Robaxisal  Alka-Seltzer Effervescent Pain Reliever and Antacid Coricidin or Coricidin-D  Indomethacin Rufen    Alka-Seltzer plus Cold Medicine Cosprin Ketoprofen S-A-C Tablets  Anacin Analgesic Tablets or Capsules Coumadin Korlgesic Salflex  Anacin Extra Strength Analgesic tablets or capsules CP-2 Tablets Lanoril Salicylate  Anaprox Cuprimine Capsules Levenox Salocol  Anexsia-D Dalteparin Magan Salsalate  Anodynos Darvon compound Magnesium Salicylate Sine-off  Ansaid Dasin Capsules Magsal Sodium Salicylate  Anturane Depen Capsules Marnal Soma  APF Arthritis pain formula Dewitt's Pills Measurin Stanback  Argesic Dia-Gesic Meclofenamic Sulfinpyrazone  Arthritis Bayer Timed Release Aspirin Diclofenac Meclomen Sulindac  Arthritis pain formula Anacin Dicumarol Medipren Supac  Analgesic (Safety coated) Arthralgen Diffunasal Mefanamic Suprofen  Arthritis Strength Bufferin Dihydrocodeine Mepro Compound Suprol  Arthropan liquid Dopirydamole Methcarbomol with Aspirin Synalgos  ASA tablets/Enseals Disalcid Micrainin Tagament  Ascriptin Doan's Midol Talwin  Ascriptin A/D Dolene Mobidin Tanderil  Ascriptin Extra Strength Dolobid Moblgesic Ticlid  Ascriptin with Codeine Doloprin or Doloprin with Codeine Momentum Tolectin  Asperbuf Duoprin Mono-gesic Trendar  Aspergum Duradyne Motrin or Motrin IB Triminicin  Aspirin  plain, buffered or enteric coated Durasal Myochrisine Trigesic  Aspirin Suppositories Easprin Nalfon Trillsate  Aspirin with Codeine Ecotrin Regular or Extra Strength Naprosyn Uracel  Atromid-S Efficin Naproxen Ursinus  Auranofin Capsules Elmiron Neocylate Vanquish  Axotal Emagrin Norgesic Verin  Azathioprine Empirin or Empirin with Codeine Normiflo Vitamin E  Azolid Emprazil Nuprin Voltaren  Bayer Aspirin plain, buffered or children's or timed BC Tablets or powders Encaprin Orgaran Warfarin Sodium  Buff-a-Comp Enoxaparin Orudis Zorpin  Buff-a-Comp with Codeine Equegesic Os-Cal-Gesic   Buffaprin Excedrin plain, buffered or Extra Strength Oxalid   Bufferin Arthritis Strength Feldene Oxphenbutazone   Bufferin plain or Extra Strength Feldene Capsules Oxycodone with Aspirin   Bufferin with Codeine Fenoprofen Fenoprofen Pabalate or Pabalate-SF   Buffets II Flogesic Panagesic   Buffinol plain or Extra Strength Florinal or Florinal with Codeine Panwarfarin   Buf-Tabs Flurbiprofen Penicillamine   Butalbital Compound Four-way cold tablets Penicillin   Butazolidin Fragmin Pepto-Bismol   Carbenicillin Geminisyn Percodan   Carna Arthritis Reliever Geopen Persantine   Carprofen Gold's salt Persistin   Chloramphenicol Goody's Phenylbutazone   Chloromycetin Haltrain Piroxlcam   Clmetidine heparin Plaquenil   Cllnoril Hyco-pap Ponstel   Clofibrate Hydroxy chloroquine Propoxyphen         Before stopping any of these medications, be sure to consult the physician who ordered them.  Some, such as Coumadin (Warfarin) are ordered to prevent or treat serious conditions such as "deep thrombosis", "pumonary embolisms", and other heart problems.  The amount of time that you may need off of the medication may also vary with the medication and the reason for which you were taking it.  If you are taking any of these medications, please make sure you notify your pain physician before you undergo any  procedures.         Facet Joint Block The facet joints connect the bones of the spine (vertebrae). They make it possible for you to bend, twist, and make other movements with your spine. They also prevent you from overbending, overtwisting, and making other excessive movements.  A facet joint block is a procedure where a numbing medicine (anesthetic) is injected into a facet joint. Often, a type of anti-inflammatory medicine called a steroid is also injected. A facet joint block may be done for two reasons:   Diagnosis. A facet joint block may be done as a test to see whether neck or back pain is caused by a worn-down or infected facet joint. If the pain gets better after a facet joint block, it means the   pain is probably coming from the facet joint. If the pain does not get better, it means the pain is probably not coming from the facet joint.   Therapy. A facet joint block may be done to relieve neck or back pain caused by a facet joint. A facet joint block is only done as a therapy if the pain does not improve with medicine, exercise programs, physical therapy, and other forms of pain management. LET YOUR HEALTH CARE PROVIDER KNOW ABOUT:   Any allergies you have.   All medicines you are taking, including vitamins, herbs, eyedrops, and over-the-counter medicines and creams.   Previous problems you or members of your family have had with the use of anesthetics.   Any blood disorders you have had.   Other health problems you have. RISKS AND COMPLICATIONS Generally, having a facet joint block is safe. However, as with any procedure, complications can occur. Possible complications associated with having a facet joint block include:   Bleeding.   Injury to a nerve near the injection site.   Pain at the injection site.   Weakness or numbness in areas controlled by nerves near the injection site.   Infection.   Temporary fluid retention.   Allergic reaction to  anesthetics or medicines used during the procedure. BEFORE THE PROCEDURE   Follow your health care provider's instructions if you are taking dietary supplements or medicines. You may need to stop taking them or reduce your dosage.   Do not take any new dietary supplements or medicines without asking your health care provider first.   Follow your health care provider's instructions about eating and drinking before the procedure. You may need to stop eating and drinking several hours before the procedure.   Arrange to have an adult drive you home after the procedure. PROCEDURE  You may need to remove your clothing and dress in an open-back gown so that your health care provider can access your spine.   The procedure will be done while you are lying on an X-ray table. Most of the time you will be asked to lie on your stomach, but you may be asked to lie in a different position if an injection will be made in your neck.   Special machines will be used to monitor your oxygen levels, heart rate, and blood pressure.   If an injection will be made in your neck, an intravenous (IV) tube will be inserted into one of your veins. Fluids and medicine will flow directly into your body through the IV tube.   The area over the facet joint where the injection will be made will be cleaned with an antiseptic soap. The surrounding skin will be covered with sterile drapes.   An anesthetic will be applied to your skin to make the injection area numb. You may feel a temporary stinging or burning sensation.   A video X-ray machine will be used to locate the joint. A contrast dye may be injected into the facet joint area to help with locating the joint.   When the joint is located, an anesthetic medicine will be injected into the joint through the needle.   Your health care provider will ask you whether you feel pain relief. If you do feel relief, a steroid may be injected to provide pain relief for a  longer period of time. If you do not feel relief or feel only partial relief, additional injections of an anesthetic may be made in other facet joints.     The needle will be removed, the skin will be cleansed, and bandages will be applied.  AFTER THE PROCEDURE   You will be observed for 15-30 minutes before being allowed to go home. Do not drive. Have an adult drive you or take a taxi or public transportation instead.   If you feel pain relief, the pain will return in several hours or days when the anesthetic wears off.   You may feel pain relief 2-14 days after the procedure. The amount of time this relief lasts varies from person to person.   It is normal to feel some tenderness over the injected area(s) for 2 days following the procedure.   If you have diabetes, you may have a temporary increase in blood sugar.   This information is not intended to replace advice given to you by your health care provider. Make sure you discuss any questions you have with your health care provider.   Document Released: 03/10/2007 Document Revised: 11/09/2014 Document Reviewed: 08/08/2012 [employer] Interactive Patient Education 2016 [employer]  

## 2016-08-03 NOTE — Progress Notes (Signed)
Patient's Name: Stephanie [name]  MRN: [id number]  Referring Provider: Clarisse Gouge, MD  DOB: 24-Oct-1961  PCP: Clarisse Gouge, MD  DOS: 08/03/2016  Note by: Kathlen Brunswick. Dossie Arbour, MD  Service setting: Ambulatory outpatient  Specialty: Interventional Pain Management  Location: [facility] (AMB) Pain Management Facility    Patient type: Established   Primary Reason(s) for Visit: Encounter for prescription drug management (Level of risk: moderate) CC: Back Pain (lower)  HPI  Stephanie [name] is a 55 y.o. year old, female patient, who comes today for an initial evaluation. 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 Her primarily concern today is the Back Pain  (lower)  Pain Assessment: Self-Reported Pain Score: 5 /10 Clinically the patient looks like a 3/10 Reported level is inconsistent with clinical observations. Information on the proper use of the pain score provided to the patient today. Pain Location: Back Pain Descriptors / Indicators: Throbbing, Sharp, Constant, Aching, Burning Pain Frequency: Constant  The patient comes into the clinics today for pharmacological management of her chronic pain. I last saw this patient on 07/04/2016. The patient  reports that she does not use drugs. Her body mass index is 33.67 kg/m.  Date of Last Visit: 05/07/16 Service Provided on Last Visit: Med Refill  Controlled Substance Pharmacotherapy Assessment & REMS (Risk Evaluation and Mitigation Strategy)  Analgesic: Hydrocodone/APAP 10/325 one every 12 hours (20 mg/day) MME/day: 20 mg/day. Pill Count: hydrocodone 10-325mg  13 remaining filled 07/10/16. Pharmacokinetics: Onset of action (Liberation/Absorption): Within expected pharmacological parameters Time to Peak effect (Distribution): Timing and results are as within normal expected parameters Duration of action (Metabolism/Excretion): Within normal limits for medication Pharmacodynamics: Analgesic Effect: More than 50% Activity Facilitation: Medication(s) allow patient to sit, stand, walk, and do the basic ADLs Perceived Effectiveness: Described as relatively effective, allowing for increase in activities of daily living (ADL) Side-effects or Adverse reactions: None reported Monitoring: [place] PMP: Online review of the past 80-month period conducted. Compliant with practice rules and regulations List of all UDS test(s) done:  Lab Results  Component Value Date   TOXASSSELUR FINAL 05/07/2016   TOXASSSELUR FINAL 02/12/2016   [place] FINAL 11/18/2015   Last UDS on record: ToxAssure Select 13  Date Value Ref Range Status  05/07/2016 FINAL  Final    Comment:     ==================================================================== TOXASSURE SELECT 13 (MW) ==================================================================== Test  Result       Flag       Units Drug Present and Declared for Prescription Verification   Hydrocodone                    4034         EXPECTED   ng/mg creat   Hydromorphone                  443          EXPECTED   ng/mg creat   Dihydrocodeine                 143          EXPECTED   ng/mg creat   Norhydrocodone                 >2809        EXPECTED   ng/mg creat    Sources of hydrocodone include scheduled prescription    medications. Hydromorphone, dihydrocodeine and norhydrocodone are    expected metabolites of hydrocodone. Hydromorphone and    dihydrocodeine are also available as scheduled prescription    medications. ==================================================================== Test                      Result    Flag   Units      Ref Range   Creatinine              178              mg/dL      >=20 ==================================================================== Declared Medications:  The flagging and interpretation on this report are based on the  following declared medications.  Unexpected results may arise from  inaccuracies in the declared medications.  **Note: The testing scope of this panel includes these medications:  Hydrocodone (Norco)  **Note: The testing scope of this panel does not include following  reported medications:  Acetaminophen (Norco)  Cyclobenzaprine (Flexeril)  Gabapentin  Melatonin  Meloxicam (Mobic)  Vitamin D3 ==================================================================== For clinical consultation, please call [phone]. ====================================================================    UDS interpretation: Compliant          Medication Assessment Form: Reviewed. Patient indicates being compliant with therapy Treatment compliance:  Compliant Risk Assessment: Aberrant Behavior: None observed today Substance Use Disorder (SUD) Risk Level: Low-to-moderate Risk of opioid abuse or dependence: 0.7-3.0% with doses ? 36 MME/day and 6.1-26% with doses ? 120 MME/day. Opioid Risk Tool (ORT) Score: 0   Low Risk for SUD (Score <3) Depression Scale Score: PHQ-2: 0   No depression (0) PHQ-9: 0   No depression (0-4)  Pharmacologic Plan: No change in therapy, at this time  Laboratory Chemistry  Inflammation Markers Lab Results  Component Value Date   ESRSEDRATE 27 11/18/2015   CRP 0.7 11/18/2015   Renal Function Lab Results  Component Value Date   BUN 9 11/18/2015   CREATININE 0.59 11/18/2015   GFRAA >60 11/18/2015   GFRNONAA >60 11/18/2015   Hepatic Function Lab Results  Component Value Date   AST 18 11/18/2015   ALT 14 11/18/2015   ALBUMIN 4.0 11/18/2015   Electrolytes Lab Results  Component Value Date   NA 141 11/18/2015   K 4.2 11/18/2015   CL 109 11/18/2015   CALCIUM 9.0 11/18/2015   MG 2.0 11/18/2015   Pain Modulating Vitamins Lab Results  Component Value Date   25OHVITD1 34 05/07/2016   25OHVITD2 <1.0 05/07/2016   25OHVITD3 34 05/07/2016  [id number] 318 05/07/2016   Coagulation Parameters Lab Results  Component Value Date   PLT 298 04/10/2014   Cardiovascular Lab Results  Component Value Date   HGB 8.7 (L) 04/10/2014   HCT 28.6 (L) 04/10/2014    Note: Lab results reviewed.  Recent Diagnostic Imaging  No results found. Meds  The patient has a current medication list which includes the following prescription(s): vitamin d3, cyclobenzaprine, gabapentin, hydrocodone-acetaminophen, hydrocodone-acetaminophen, hydrocodone-acetaminophen, melatonin, meloxicam, and trazodone.  Current Outpatient Prescriptions on File Prior to Visit  Medication Sig  . Cholecalciferol (VITAMIN D3) 5000 units CAPS Take 1 capsule (5,000 Units total) by mouth daily with breakfast.   No current  facility-administered medications on file prior to visit.    ROS  Constitutional: Denies any fever or chills Gastrointestinal: No reported hemesis, hematochezia, vomiting, or acute GI distress Musculoskeletal: Denies any acute onset joint swelling, redness, loss of ROM, or weakness Neurological: No reported episodes of acute onset apraxia, aphasia, dysarthria, agnosia, amnesia, paralysis, loss of coordination, or loss of consciousness  Allergies  Stephanie [name] is allergic to phenergan [promethazine hcl].  [place]  Medical:  Stephanie [name]  has a past medical history of Allergy; Anemia, iron deficiency (04/09/2014); Arthritis; Atypical chest pain (04/09/2014); CD (contact dermatitis) (03/01/2014); Chest pain (04/09/2014); and Lumbar radicular pain (08/22/2015). Family: family history includes Asthma in her father; Stroke in her mother. Surgical:  has a past surgical history that includes Back surgery; Appendectomy; Cholecystectomy; Cesarean section; and Bunionectomy. Tobacco:  reports that she has never smoked. She has never used smokeless tobacco. Alcohol:  reports that she does not drink alcohol. Drug:  reports that she does not use drugs.  Constitutional Exam  General appearance: Well nourished, well developed, and well hydrated. In no acute distress Vitals:   08/03/16 0804  BP: (!) 127/93  Pulse: 87  Resp: 16  Temp: 97.9 F (36.6 C)  TempSrc: Oral  SpO2: 99%  Weight: 215 lb (97.5 kg)  Height: 5\' 7"  (1.702 m)  BMI Assessment: Estimated body mass index is 33.67 kg/m as calculated from the following:   Height as of this encounter: 5\' 7"  (1.702 m).   Weight as of this encounter: 215 lb (97.5 kg).   BMI interpretation: (30-34.9 kg/m2) = Obese (Class I): This range is associated with a 68% higher incidence of chronic pain. BMI Readings from Last 4 Encounters:  08/03/16 33.67 kg/m  05/07/16 33.67 kg/m  04/15/16 33.67 kg/m  03/19/16 33.20 kg/m   Wt Readings from Last 4 Encounters:   08/03/16 215 lb (97.5 kg)  05/07/16 215 lb (97.5 kg)  04/15/16 215 lb (97.5 kg)  03/19/16 212 lb (96.2 kg)  Psych/Mental status: Alert and oriented x 3 (person, place, & time) Eyes: PERLA Respiratory: No evidence of acute respiratory distress  Cervical Spine Exam  Inspection: No masses, redness, or swelling Alignment: Symmetrical Functional ROM: Unrestricted ROM Stability: No instability detected Muscle strength & Tone: Functionally intact Sensory: Unimpaired Palpation: Non-contributory  Upper Extremity (UE) Exam    Side: Right upper extremity  Side: Left upper extremity  Inspection: No masses, redness, swelling, or asymmetry  Inspection: No masses, redness, swelling, or asymmetry  Functional ROM: Unrestricted ROM         Functional ROM: Unrestricted ROM          Muscle strength & Tone: Functionally intact  Muscle strength & Tone: Functionally intact  Sensory: Unimpaired  Sensory: Unimpaired  Palpation: Non-contributory  Palpation: Non-contributory   Thoracic Spine Exam  Inspection: No masses, redness,  or swelling Alignment: Symmetrical Functional ROM: Unrestricted ROM Stability: No instability detected Sensory: Unimpaired Muscle strength & Tone: Functionally intact Palpation: Non-contributory  Lumbar Spine Exam  Inspection: No masses, redness, or swelling Alignment: Symmetrical Functional ROM: Decreased ROM Stability: No instability detected Muscle strength & Tone: Functionally intact Sensory: Movement-associated pain Palpation: Complains of area being tender to palpation Provocative Tests: Lumbar Hyperextension and rotation test: Positive bilaterally for facet joint pain. Patrick's Maneuver: Positive for bilateral S-I joint pain and for bilateral hip joint pain.  Gait & Posture Assessment  Ambulation: Unassisted Gait: Relatively normal for age and body habitus Posture: WNL   Lower Extremity Exam    Side: Right lower extremity  Side: Left lower extremity   Inspection: No masses, redness, swelling, or asymmetry  Inspection: No masses, redness, swelling, or asymmetry  Functional ROM: Unrestricted ROM          Functional ROM: Unrestricted ROM          Muscle strength & Tone: Functionally intact  Muscle strength & Tone: Functionally intact  Sensory: Unimpaired  Sensory: Unimpaired  Palpation: Non-contributory  Palpation: Non-contributory   Assessment  Primary Diagnosis & Pertinent Problem List: The primary encounter diagnosis was Other chronic pain. 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 Evlyn Clines, [facility]), Lumbar facet syndrome (Location of Primary Source of Pain) (Bilateral) (L>R), Insomnia secondary to chronic pain, Musculoskeletal pain, Spasm of paraspinal muscle, Neurogenic pain, Spondylolisthesis of lumbar region, and Chronic sacroiliac joint pain (Bilateral) were also pertinent to this visit.  Visit Diagnosis: 1. Other chronic pain   2. Long term current use of opiate analgesic   3. Opiate use   4. Chronic bilateral low back pain with bilateral sciatica   5. Chronic pain of both lower extremities   6. 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 Evlyn Clines, [facility])   7. Lumbar facet syndrome (Location of Primary Source of Pain) (Bilateral) (L>R)   8. Insomnia secondary to chronic pain   9. Musculoskeletal pain   10. Spasm of paraspinal muscle   11. Neurogenic pain   12. Spondylolisthesis of lumbar region   13. Chronic sacroiliac joint pain (Bilateral)    Plan of Care  Pharmacotherapy (Medications Ordered): Meds ordered this encounter  Medications  . HYDROcodone-acetaminophen (NORCO) 10-325 MG tablet    Sig: Take 1 tablet by mouth every 12 (twelve) hours as needed for severe pain.    Dispense:  60 tablet     Refill:  0    Do not place this medication, or any other prescription from our practice, on "Automatic Refill". Patient may have prescription filled one day early if pharmacy is closed on scheduled refill date. Do not fill until: 08/17/16 To last until: 09/16/16  . HYDROcodone-acetaminophen (NORCO) 10-325 MG tablet    Sig: Take 1 tablet by mouth every 12 (twelve) hours as needed for severe pain.    Dispense:  60 tablet    Refill:  0    Do not place this medication, or any other prescription from our practice, on "Automatic Refill". Patient may have prescription filled one day early if pharmacy is closed on scheduled refill date. Do not fill until: 09/16/16 To last until: 10/16/16  . HYDROcodone-acetaminophen (NORCO) 10-325 MG tablet    Sig: Take 1 tablet by mouth every 12 (  twelve) hours as needed for severe pain.    Dispense:  60 tablet    Refill:  0    Do not place this medication, or any other prescription from our practice, on "Automatic Refill". Patient may have prescription filled one day early if pharmacy is closed on scheduled refill date. Do not fill until: 10/16/16 To last until: 11/15/16  . Melatonin 10 MG CAPS    Sig: Take 10-20 mg by mouth at bedtime.    Dispense:  60 capsule    Refill:  2    Do not place this medication, or any other prescription from our practice, on "Automatic Refill". Patient may have prescription filled one day early if pharmacy is closed on scheduled refill date.  . cyclobenzaprine (FLEXERIL) 10 MG tablet    Sig: Take 1 tablet (10 mg total) by mouth every 8 (eight) hours as needed for muscle spasms.    Dispense:  90 tablet    Refill:  2    Do not place this medication, or any other prescription from our practice, on "Automatic Refill". Patient may have prescription filled one day early if pharmacy is closed on scheduled refill date.  . gabapentin (NEURONTIN) 300 MG capsule    Sig: Take 1-3 capsules (300-900 mg total) by mouth at bedtime.    Dispense:   90 capsule    Refill:  2    Do not place this medication, or any other prescription from our practice, on "Automatic Refill". Patient may have prescription filled one day early if pharmacy is closed on scheduled refill date.  . meloxicam (MOBIC) 15 MG tablet    Sig: Take 1 tablet (15 mg total) by mouth every morning.    Dispense:  30 tablet    Refill:  2    Do not place this medication, or any other prescription from our practice, on "Automatic Refill". Patient may have prescription filled one day early if pharmacy is closed on scheduled refill date.   New Prescriptions   No medications on file   Medications administered during this visit: Stephanie [name] had no medications administered during this visit. Lab-work, Procedure(s), & Referral(s) Ordered: Orders Placed This Encounter  Procedures  . LUMBAR FACET(MEDIAL BRANCH NERVE BLOCK) MBNB  . SACROILIAC JOINT INJECTINS   Imaging & Referral(s) Ordered: None  Interventional Therapies: Scheduled: Diagnostic bilateral lumbar facet block + diagnostic bilateral sacroiliac joint block under fluoroscopic guidance and IV sedation, as soon as possible for her low back pain.    Considering: 1. Diagnostic bilateral lumbar facet block. 2. Possible bilateral lumbar facet RFA. 3. Diagnostic Caudal ESI. 4. Diagnostic Bilateral L4-5 & L5-S1Lumbar TFESI. 5. Diagnostic bilateral sacroiliac joint block. 6. Possible bilateral sacroiliac joint RFA. 7. Diagnostic bilateral hip joint injection. 8. Possible bilateral hip joint RFA. 9. Diagnostic bilateral knee joint injection. 10. Possible series of Hyalgan knee injections. 11. Diagnostic bilateral genicular nerve block. 12. Possible bilateral genicular nerve RFA.    PRN Procedures: 1. Diagnostic bilateral lumbar facet block. 2. Diagnostic Caudal ESI. 3. Diagnostic Bilateral L4-5 & L5-S1Lumbar TFESI. 4. Diagnostic bilateral sacroiliac joint block. 5. Diagnostic bilateral hip joint  injection. 6. Diagnostic bilateral knee joint injection. 7. Possible series of Hyalgan knee injections. 8. Diagnostic bilateral genicular nerve block.   Requested PM Follow-up: Return in about 3 months (around 11/03/2016) for Med-Mgmt, In addition, Schedule Procedure, (ASAP).  Future Appointments Date Time Provider [place]  11/03/2016 7:45 AM Milinda Pointer, MD [facility] None   Primary Care Physician: Clarisse Gouge, MD  Location: [facility] Note by: Francois Elk A. Dossie Arbour, M.D, DABA, DABAPM, DABPM, DABIPP, FIPP  Pain Score Disclaimer: We use the NRS-11 scale. This is a self-reported, subjective measurement of pain severity with only modest accuracy. It is used primarily to identify changes within a particular patient. It must be understood that outpatient pain scales are significantly less accurate that those used for research, where they can be applied under ideal controlled circumstances with minimal exposure to variables. In reality, the score is likely to be a combination of pain intensity and pain affect, where pain affect describes the degree of emotional arousal or changes in action readiness caused by the sensory experience of pain. Factors such as social and work situation, setting, emotional state, anxiety levels, expectation, and prior pain experience may influence pain perception and show large inter-individual differences that may also be affected by time variables.  Patient instructions provided during this appointment: Patient Instructions   GENERAL RISKS AND COMPLICATIONS  What are the risk, side effects and possible complications? Generally speaking, most procedures are safe.  However, with any procedure there are risks, side effects, and the possibility of complications.  The risks and complications are dependent upon the sites that are lesioned, or the type of nerve block to be performed.  The closer the procedure is to the spine, the  more serious the risks are.  Great care is taken when placing the radio frequency needles, block needles or lesioning probes, but sometimes complications can occur. 1. Infection: Any time there is an injection through the skin, there is a risk of infection.  This is why sterile conditions are used for these blocks.  There are four possible types of infection. 1. Localized skin infection. 2. Central Nervous System Infection-This can be in the form of Meningitis, which can be deadly. 3. Epidural Infections-This can be in the form of an epidural abscess, which can cause pressure inside of the spine, causing compression of the spinal cord with subsequent paralysis. This would require an emergency surgery to decompress, and there are no guarantees that the patient would recover from the paralysis. 4. Discitis-This is an infection of the intervertebral discs.  It occurs in about 1% of discography procedures.  It is difficult to treat and it may lead to surgery.        2. Pain: the needles have to go through skin and soft tissues, will cause soreness.       3. Damage to internal structures:  The nerves to be lesioned may be near blood vessels or    other nerves which can be potentially damaged.       4. Bleeding: Bleeding is more common if the patient is taking blood thinners such as  aspirin, Coumadin, Ticiid, Plavix, etc., or if he/she have some genetic predisposition  such as hemophilia. Bleeding into the spinal canal can cause compression of the spinal  cord with subsequent paralysis.  This would require an emergency surgery to  decompress and there are no guarantees that the patient would recover from the  paralysis.       5. Pneumothorax:  Puncturing of a lung is a possibility, every time a needle is introduced in  the area of the chest or upper back.  Pneumothorax refers to free air around the  collapsed lung(s), inside of the thoracic cavity (chest cavity).  Another two possible  complications related to  a similar event would include: Hemothorax and Chylothorax.   These are variations of the  Pneumothorax, where instead of air around the collapsed  lung(s), you may have blood or chyle, respectively.       6. Spinal headaches: They may occur with any procedures in the area of the spine.       7. Persistent CSF (Cerebro-Spinal Fluid) leakage: This is a rare problem, but may occur  with prolonged intrathecal or epidural catheters either due to the formation of a fistulous  track or a dural tear.       8. Nerve damage: By working so close to the spinal cord, there is always a possibility of  nerve damage, which could be as serious as a permanent spinal cord injury with  paralysis.       9. Death:  Although rare, severe deadly allergic reactions known as "Anaphylactic  reaction" can occur to any of the medications used.      10. Worsening of the symptoms:  We can always make thing worse.  What are the chances of something like this happening? Chances of any of this occuring are extremely low.  By statistics, you have more of a chance of getting killed in a motor vehicle accident: while driving to the hospital than any of the above occurring .  Nevertheless, you should be aware that they are possibilities.  In general, it is similar to taking a shower.  Everybody knows that you can slip, hit your head and get killed.  Does that mean that you should not shower again?  Nevertheless always keep in mind that statistics do not mean anything if you happen to be on the wrong side of them.  Even if a procedure has a 1 (one) in a 1,000,000 (million) chance of going wrong, it you happen to be that one..Also, keep in mind that by statistics, you have more of a chance of having something go wrong when taking medications.  Who should not have this procedure? If you are on a blood thinning medication (e.g. Coumadin, Plavix, see list of "Blood Thinners"), or if you have an active infection going on, you should not have the  procedure.  If you are taking any blood thinners, please inform your physician.  How should I prepare for this procedure?  Do not eat or drink anything at least six hours prior to the procedure.  Bring a driver with you .  It cannot be a taxi.  Come accompanied by an adult that can drive you back, and that is strong enough to help you if your legs get weak or numb from the local anesthetic.  Take all of your medicines the morning of the procedure with just enough water to swallow them.  If you have diabetes, make sure that you are scheduled to have your procedure done first thing in the morning, whenever possible.  If you have diabetes, take only half of your insulin dose and notify our nurse that you have done so as soon as you arrive at the clinic.  If you are diabetic, but only take blood sugar pills (oral hypoglycemic), then do not take them on the morning of your procedure.  You may take them after you have had the procedure.  Do not take aspirin or any aspirin-containing medications, at least eleven (11) days prior to the procedure.  They may prolong bleeding.  Wear loose fitting clothing that may be easy to take off and that you would not mind if it got stained with Betadine or blood.  Do not wear any jewelry or  perfume  Remove any nail coloring.  It will interfere with some of our monitoring equipment.  NOTE: Remember that this is not meant to be interpreted as a complete list of all possible complications.  Unforeseen problems may occur.  BLOOD THINNERS The following drugs contain aspirin or other products, which can cause increased bleeding during surgery and should not be taken for 2 weeks prior to and 1 week after surgery.  If you should need take something for relief of minor pain, you may take acetaminophen which is found in Tylenol,m Datril, Anacin-3 and Panadol. It is not blood thinner. The products listed below are.  Do not take any of the products listed below in  addition to any listed on your instruction sheet.  A.P.C or A.P.C with Codeine Codeine Phosphate Capsules #3 Ibuprofen Ridaura  ABC compound Congesprin Imuran rimadil  Advil Cope Indocin Robaxisal  Alka-Seltzer Effervescent Pain Reliever and Antacid Coricidin or Coricidin-D  Indomethacin Rufen  Alka-Seltzer plus Cold Medicine Cosprin Ketoprofen S-A-C Tablets  Anacin Analgesic Tablets or Capsules Coumadin Korlgesic Salflex  Anacin Extra Strength Analgesic tablets or capsules CP-2 Tablets Lanoril Salicylate  Anaprox Cuprimine Capsules Levenox Salocol  Anexsia-D Dalteparin Magan Salsalate  Anodynos Darvon compound Magnesium Salicylate Sine-off  Ansaid Dasin Capsules Magsal Sodium Salicylate  Anturane Depen Capsules Marnal Soma  APF Arthritis pain formula Dewitt's Pills Measurin Stanback  Argesic Dia-Gesic Meclofenamic Sulfinpyrazone  Arthritis Bayer Timed Release Aspirin Diclofenac Meclomen Sulindac  Arthritis pain formula Anacin Dicumarol Medipren Supac  Analgesic (Safety coated) Arthralgen Diffunasal Mefanamic Suprofen  Arthritis Strength Bufferin Dihydrocodeine Mepro Compound Suprol  Arthropan liquid Dopirydamole Methcarbomol with Aspirin Synalgos  ASA tablets/Enseals Disalcid Micrainin Tagament  Ascriptin Doan's Midol Talwin  Ascriptin A/D Dolene Mobidin Tanderil  Ascriptin Extra Strength Dolobid Moblgesic Ticlid  Ascriptin with Codeine Doloprin or Doloprin with Codeine Momentum Tolectin  Asperbuf Duoprin Mono-gesic Trendar  Aspergum Duradyne Motrin or Motrin IB Triminicin  Aspirin plain, buffered or enteric coated Durasal Myochrisine Trigesic  Aspirin Suppositories Easprin Nalfon Trillsate  Aspirin with Codeine Ecotrin Regular or Extra Strength Naprosyn Uracel  Atromid-S Efficin Naproxen Ursinus  Auranofin Capsules Elmiron Neocylate Vanquish  Axotal Emagrin Norgesic Verin  Azathioprine Empirin or Empirin with Codeine Normiflo Vitamin E  Azolid Emprazil Nuprin Voltaren  Bayer  Aspirin plain, buffered or children's or timed BC Tablets or powders Encaprin Orgaran Warfarin Sodium  Buff-a-Comp Enoxaparin Orudis Zorpin  Buff-a-Comp with Codeine Equegesic Os-Cal-Gesic   Buffaprin Excedrin plain, buffered or Extra Strength Oxalid   Bufferin Arthritis Strength Feldene Oxphenbutazone   Bufferin plain or Extra Strength Feldene Capsules Oxycodone with Aspirin   Bufferin with Codeine Fenoprofen Fenoprofen Pabalate or Pabalate-SF   Buffets II Flogesic Panagesic   Buffinol plain or Extra Strength Florinal or Florinal with Codeine Panwarfarin   Buf-Tabs Flurbiprofen Penicillamine   Butalbital Compound Four-way cold tablets Penicillin   Butazolidin Fragmin Pepto-Bismol   Carbenicillin Geminisyn Percodan   Carna Arthritis Reliever Geopen Persantine   Carprofen Gold's salt Persistin   Chloramphenicol Goody's Phenylbutazone   Chloromycetin Haltrain Piroxlcam   Clmetidine heparin Plaquenil   Cllnoril Hyco-pap Ponstel   Clofibrate Hydroxy chloroquine Propoxyphen         Before stopping any of these medications, be sure to consult the physician who ordered them.  Some, such as Coumadin (Warfarin) are ordered to prevent or treat serious conditions such as "deep thrombosis", "pumonary embolisms", and other heart problems.  The amount of time that you may need off of the medication may also vary with the  medication and the reason for which you were taking it.  If you are taking any of these medications, please make sure you notify your pain physician before you undergo any procedures.         Facet Joint Block The facet joints connect the bones of the spine (vertebrae). They make it possible for you to bend, twist, and make other movements with your spine. They also prevent you from overbending, overtwisting, and making other excessive movements.  A facet joint block is a procedure where a numbing medicine (anesthetic) is injected into a facet joint. Often, a type of  anti-inflammatory medicine called a steroid is also injected. A facet joint block may be done for two reasons:   Diagnosis. A facet joint block may be done as a test to see whether neck or back pain is caused by a worn-down or infected facet joint. If the pain gets better after a facet joint block, it means the pain is probably coming from the facet joint. If the pain does not get better, it means the pain is probably not coming from the facet joint.   Therapy. A facet joint block may be done to relieve neck or back pain caused by a facet joint. A facet joint block is only done as a therapy if the pain does not improve with medicine, exercise programs, physical therapy, and other forms of pain management. LET [facility] CARE PROVIDER KNOW ABOUT:   Any allergies you have.   All medicines you are taking, including vitamins, herbs, eyedrops, and over-the-counter medicines and creams.   Previous problems you or members of your family have had with the use of anesthetics.   Any blood disorders you have had.   Other health problems you have. RISKS AND COMPLICATIONS Generally, having a facet joint block is safe. However, as with any procedure, complications can occur. Possible complications associated with having a facet joint block include:   Bleeding.   Injury to a nerve near the injection site.   Pain at the injection site.   Weakness or numbness in areas controlled by nerves near the injection site.   Infection.   Temporary fluid retention.   Allergic reaction to anesthetics or medicines used during the procedure. BEFORE THE PROCEDURE   Follow your health care provider's instructions if you are taking dietary supplements or medicines. You may need to stop taking them or reduce your dosage.   Do not take any new dietary supplements or medicines without asking your health care provider first.   Follow your health care provider's instructions about eating and drinking  before the procedure. You may need to stop eating and drinking several hours before the procedure.   Arrange to have an adult drive you home after the procedure. PROCEDURE  You may need to remove your clothing and dress in an open-back gown so that your health care provider can access your spine.   The procedure will be done while you are lying on an X-ray table. Most of the time you will be asked to lie on your stomach, but you may be asked to lie in a different position if an injection will be made in your neck.   Special machines will be used to monitor your oxygen levels, heart rate, and blood pressure.   If an injection will be made in your neck, an intravenous (IV) tube will be inserted into one of your veins. Fluids and medicine will flow directly into your body through the IV  tube.   The area over the facet joint where the injection will be made will be cleaned with an antiseptic soap. The surrounding skin will be covered with sterile drapes.   An anesthetic will be applied to your skin to make the injection area numb. You may feel a temporary stinging or burning sensation.   A video X-ray machine will be used to locate the joint. A contrast dye may be injected into the facet joint area to help with locating the joint.   When the joint is located, an anesthetic medicine will be injected into the joint through the needle.   Your health care provider will ask you whether you feel pain relief. If you do feel relief, a steroid may be injected to provide pain relief for a longer period of time. If you do not feel relief or feel only partial relief, additional injections of an anesthetic may be made in other facet joints.   The needle will be removed, the skin will be cleansed, and bandages will be applied.  AFTER THE PROCEDURE   You will be observed for 15-30 minutes before being allowed to go home. Do not drive. Have an adult drive you or take a taxi or public transportation  instead.   If you feel pain relief, the pain will return in several hours or days when the anesthetic wears off.   You may feel pain relief 2-14 days after the procedure. The amount of time this relief lasts varies from person to person.   It is normal to feel some tenderness over the injected area(s) for 2 days following the procedure.   If you have diabetes, you may have a temporary increase in blood sugar.   This information is not intended to replace advice given to you by your health care provider. Make sure you discuss any questions you have with your health care provider.   Document Released: 03/10/2007 Document Revised: 11/09/2014 Document Reviewed: 08/08/2012 [employer] Interactive Patient Education [employer].

## 2016-08-21 ENCOUNTER — Telehealth: Payer: Self-pay | Admitting: *Deleted

## 2016-10-09 ENCOUNTER — Encounter: Payer: Self-pay | Admitting: Pain Medicine

## 2016-10-14 ENCOUNTER — Other Ambulatory Visit: Payer: Self-pay | Admitting: Pain Medicine

## 2016-10-14 DIAGNOSIS — G8929 Other chronic pain: Principal | ICD-10-CM

## 2016-10-14 DIAGNOSIS — M4316 Spondylolisthesis, lumbar region: Secondary | ICD-10-CM

## 2016-10-14 DIAGNOSIS — G4701 Insomnia due to medical condition: Secondary | ICD-10-CM

## 2016-11-03 ENCOUNTER — Encounter: Payer: Self-pay | Admitting: Pain Medicine

## 2016-11-29 ENCOUNTER — Other Ambulatory Visit: Payer: Self-pay | Admitting: Pain Medicine

## 2016-11-29 DIAGNOSIS — M62830 Muscle spasm of back: Secondary | ICD-10-CM

## 2016-11-29 DIAGNOSIS — M792 Neuralgia and neuritis, unspecified: Secondary | ICD-10-CM

## 2016-11-29 DIAGNOSIS — M7918 Myalgia, other site: Secondary | ICD-10-CM

## 2016-12-15 DIAGNOSIS — L299 Pruritus, unspecified: Secondary | ICD-10-CM | POA: Diagnosis not present

## 2016-12-15 DIAGNOSIS — E559 Vitamin D deficiency, unspecified: Secondary | ICD-10-CM | POA: Diagnosis not present

## 2016-12-15 DIAGNOSIS — N951 Menopausal and female climacteric states: Secondary | ICD-10-CM | POA: Diagnosis not present

## 2016-12-15 DIAGNOSIS — R51 Headache: Secondary | ICD-10-CM | POA: Diagnosis not present

## 2016-12-15 DIAGNOSIS — D508 Other iron deficiency anemias: Secondary | ICD-10-CM | POA: Diagnosis not present

## 2016-12-15 DIAGNOSIS — H543 Unqualified visual loss, both eyes: Secondary | ICD-10-CM | POA: Diagnosis not present

## 2016-12-15 DIAGNOSIS — Z1159 Encounter for screening for other viral diseases: Secondary | ICD-10-CM | POA: Diagnosis not present

## 2016-12-15 DIAGNOSIS — L259 Unspecified contact dermatitis, unspecified cause: Secondary | ICD-10-CM | POA: Diagnosis not present

## 2016-12-24 ENCOUNTER — Ambulatory Visit: Payer: Worker's Compensation | Attending: Pain Medicine | Admitting: Pain Medicine

## 2016-12-24 ENCOUNTER — Encounter: Payer: Self-pay | Admitting: Pain Medicine

## 2016-12-24 ENCOUNTER — Encounter (INDEPENDENT_AMBULATORY_CARE_PROVIDER_SITE_OTHER): Payer: Self-pay

## 2016-12-24 VITALS — BP 137/83 | HR 55 | Temp 98.3°F | Resp 16 | Ht 67.0 in | Wt 214.0 lb

## 2016-12-24 DIAGNOSIS — G894 Chronic pain syndrome: Secondary | ICD-10-CM | POA: Insufficient documentation

## 2016-12-24 DIAGNOSIS — G4700 Insomnia, unspecified: Secondary | ICD-10-CM | POA: Diagnosis not present

## 2016-12-24 DIAGNOSIS — M79604 Pain in right leg: Secondary | ICD-10-CM

## 2016-12-24 DIAGNOSIS — M79605 Pain in left leg: Secondary | ICD-10-CM | POA: Diagnosis not present

## 2016-12-24 DIAGNOSIS — M5442 Lumbago with sciatica, left side: Secondary | ICD-10-CM | POA: Insufficient documentation

## 2016-12-24 DIAGNOSIS — M25562 Pain in left knee: Secondary | ICD-10-CM

## 2016-12-24 DIAGNOSIS — G8929 Other chronic pain: Secondary | ICD-10-CM

## 2016-12-24 DIAGNOSIS — M4316 Spondylolisthesis, lumbar region: Secondary | ICD-10-CM

## 2016-12-24 DIAGNOSIS — M792 Neuralgia and neuritis, unspecified: Secondary | ICD-10-CM

## 2016-12-24 DIAGNOSIS — M4310 Spondylolisthesis, site unspecified: Secondary | ICD-10-CM

## 2016-12-24 DIAGNOSIS — Z5181 Encounter for therapeutic drug level monitoring: Secondary | ICD-10-CM | POA: Diagnosis not present

## 2016-12-24 DIAGNOSIS — G9619 Other disorders of meninges, not elsewhere classified: Secondary | ICD-10-CM

## 2016-12-24 DIAGNOSIS — M961 Postlaminectomy syndrome, not elsewhere classified: Secondary | ICD-10-CM | POA: Diagnosis not present

## 2016-12-24 DIAGNOSIS — F1190 Opioid use, unspecified, uncomplicated: Secondary | ICD-10-CM

## 2016-12-24 DIAGNOSIS — M7918 Myalgia, other site: Secondary | ICD-10-CM

## 2016-12-24 DIAGNOSIS — M25561 Pain in right knee: Secondary | ICD-10-CM | POA: Diagnosis not present

## 2016-12-24 DIAGNOSIS — M62830 Muscle spasm of back: Secondary | ICD-10-CM

## 2016-12-24 DIAGNOSIS — M5441 Lumbago with sciatica, right side: Secondary | ICD-10-CM | POA: Insufficient documentation

## 2016-12-24 DIAGNOSIS — M791 Myalgia: Secondary | ICD-10-CM

## 2016-12-24 DIAGNOSIS — G4701 Insomnia due to medical condition: Secondary | ICD-10-CM

## 2016-12-24 DIAGNOSIS — G96198 Other disorders of meninges, not elsewhere classified: Secondary | ICD-10-CM

## 2016-12-24 DIAGNOSIS — Z9889 Other specified postprocedural states: Secondary | ICD-10-CM | POA: Diagnosis not present

## 2016-12-24 DIAGNOSIS — Z9049 Acquired absence of other specified parts of digestive tract: Secondary | ICD-10-CM | POA: Diagnosis not present

## 2016-12-24 DIAGNOSIS — Z79891 Long term (current) use of opiate analgesic: Secondary | ICD-10-CM | POA: Diagnosis not present

## 2016-12-24 MED ORDER — HYDROCODONE-ACETAMINOPHEN 10-325 MG PO TABS: 1.0000 | ORAL_TABLET | Freq: Two times a day (BID) | ORAL | 0 refills | Status: DC | PRN

## 2016-12-24 MED ORDER — TRAZODONE HCL 100 MG PO TABS: 50.0000 mg | ORAL_TABLET | Freq: Every evening | ORAL | 0 refills | Status: DC | PRN

## 2016-12-24 MED ORDER — CYCLOBENZAPRINE HCL 10 MG PO TABS: 10.0000 mg | ORAL_TABLET | Freq: Every day | ORAL | 0 refills | Status: DC

## 2016-12-24 MED ORDER — MELOXICAM 15 MG PO TABS: 15.0000 mg | ORAL_TABLET | Freq: Every morning | ORAL | 0 refills | Status: DC

## 2016-12-24 MED ORDER — GABAPENTIN 300 MG PO CAPS: 300.0000 mg | ORAL_CAPSULE | Freq: Four times a day (QID) | ORAL | 2 refills | Status: DC

## 2016-12-24 NOTE — Patient Instructions (Addendum)
Pain Score  Introduction: The pain score used by this practice is the Verbal Numerical Rating Scale (VNRS-11). This is an 11-point scale. It is for adults and children 10 years or older. There are significant differences in how the pain score is reported, used, and applied. Forget everything you learned in the past and learn this scoring system.  General Information: The scale should reflect your current level of pain. Unless you are specifically asked for the level of your worst pain, or your average pain. If you are asked for one of these two, then it should be understood that it is over the past 24 hours.  Basic Activities of Daily Living (ADL): Personal hygiene, dressing, eating, transferring, and using restroom.  Instructions: Most patients tend to report their level of pain as a combination of two factors, their physical pain and their psychosocial pain. This last one is also known as "suffering" and it is reflection of how physical pain affects you socially and psychologically. From now on, report them separately. From this point on, when asked to report your pain level, report only your physical pain. Use the following table for reference.  Pain Clinic Pain Levels (0-5/10)  Pain Level Score Description  No Pain 0   Mild pain 1 Nagging, annoying, but does not interfere with basic activities of daily living (ADL). Patients are able to eat, bathe, get dressed, toileting (being able to get on and off the toilet and perform personal hygiene functions), transfer (move in and out of bed or a chair without assistance), and maintain continence (able to control bladder and bowel functions). Blood pressure and heart rate are unaffected. A normal heart rate for a healthy adult ranges from 60 to 100 bpm (beats per minute).   Mild to moderate pain 2 Noticeable and distracting. Impossible to hide from other people. More frequent flare-ups. Still possible to adapt and function close to normal. It can be very  annoying and may have occasional stronger flare-ups. With discipline, patients may get used to it and adapt.   Moderate pain 3 Interferes significantly with activities of daily living (ADL). It becomes difficult to feed, bathe, get dressed, get on and off the toilet or to perform personal hygiene functions. Difficult to get in and out of bed or a chair without assistance. Very distracting. With effort, it can be ignored when deeply involved in activities.   Moderately severe pain 4 Impossible to ignore for more than a few minutes. With effort, patients may still be able to manage work or participate in some social activities. Very difficult to concentrate. Signs of autonomic nervous system discharge are evident: dilated pupils (mydriasis); mild sweating (diaphoresis); sleep interference. Heart rate becomes elevated (>115 bpm). Diastolic blood pressure (lower number) rises above 100 mmHg. Patients find relief in laying down and not moving.   Severe pain 5 Intense and extremely unpleasant. Associated with frowning face and frequent crying. Pain overwhelms the senses.  Ability to do any activity or maintain social relationships becomes significantly limited. Conversation becomes difficult. Pacing back and forth is common, as getting into a comfortable position is nearly impossible. Pain wakes you up from deep sleep. Physical signs will be obvious: pupillary dilation; increased sweating; goosebumps; brisk reflexes; cold, clammy hands and feet; nausea, vomiting or dry heaves; loss of appetite; significant sleep disturbance with inability to fall asleep or to remain asleep. When persistent, significant weight loss is observed due to the complete loss of appetite and sleep deprivation.  Blood pressure and heart   rate becomes significantly elevated. Caution: If elevated blood pressure triggers a pounding headache associated with blurred vision, then the patient should immediately seek attention at an urgent or  emergency care unit, as these may be signs of an impending stroke.    Emergency Department Pain Levels (6-10/10)  Emergency Room Pain 6 Severely limiting. Requires emergency care and should not be seen or managed at an outpatient pain management facility. Communication becomes difficult and requires great effort. Assistance to reach the emergency department may be required. Facial flushing and profuse sweating along with potentially dangerous increases in heart rate and blood pressure will be evident.   Distressing pain 7 Self-care is very difficult. Assistance is required to transport, or use restroom. Assistance to reach the emergency department will be required. Tasks requiring coordination, such as bathing and getting dressed become very difficult.   Disabling pain 8 Self-care is no longer possible. At this level, pain is disabling. The individual is unable to do even the most "basic" activities such as walking, eating, bathing, dressing, transferring to a bed, or toileting. Fine motor skills are lost. It is difficult to think clearly.   Incapacitating pain 9 Pain becomes incapacitating. Thought processing is no longer possible. Difficult to remember your own name. Control of movement and coordination are lost.   The worst pain imaginable 10 At this level, most patients pass out from pain. When this level is reached, collapse of the autonomic nervous system occurs, leading to a sudden drop in blood pressure and heart rate. This in turn results in a temporary and dramatic drop in blood flow to the brain, leading to a loss of consciousness. Fainting is one of the body's self defense mechanisms. Passing out puts the brain in a calmed state and causes it to shut down for a while, in order to begin the healing process.    Summary: 1. Refer to this scale when providing us with your pain level. 2. Be accurate and careful when reporting your pain level. This will help with your care. 3. Over-reporting  your pain level will lead to loss of credibility. 4. Even a level of 1/10 means that there is pain and will be treated at our facility. 5. High, inaccurate reporting will be documented as "Symptom Exaggeration", leading to loss of credibility and suspicions of possible secondary gains such as obtaining more narcotics, or wanting to appear disabled, for fraudulent reasons. 6. Only pain levels of 5 or below will be seen at our facility. 7. Pain levels of 6 and above will be sent to the Emergency Department and the appointment cancelled. _____________________________________________________________________________________________  GENERAL RISKS AND COMPLICATIONS  What are the risk, side effects and possible complications? Generally speaking, most procedures are safe.  However, with any procedure there are risks, side effects, and the possibility of complications.  The risks and complications are dependent upon the sites that are lesioned, or the type of nerve block to be performed.  The closer the procedure is to the spine, the more serious the risks are.  Great care is taken when placing the radio frequency needles, block needles or lesioning probes, but sometimes complications can occur. 1. Infection: Any time there is an injection through the skin, there is a risk of infection.  This is why sterile conditions are used for these blocks.  There are four possible types of infection. 1. Localized skin infection. 2. Central Nervous System Infection-This can be in the form of Meningitis, which can be deadly. 3. Epidural Infections-This can   be in the form of an epidural abscess, which can cause pressure inside of the spine, causing compression of the spinal cord with subsequent paralysis. This would require an emergency surgery to decompress, and there are no guarantees that the patient would recover from the paralysis. 4. Discitis-This is an infection of the intervertebral discs.  It occurs in about 1% of  discography procedures.  It is difficult to treat and it may lead to surgery.        2. Pain: the needles have to go through skin and soft tissues, will cause soreness.       3. Damage to internal structures:  The nerves to be lesioned may be near blood vessels or    other nerves which can be potentially damaged.       4. Bleeding: Bleeding is more common if the patient is taking blood thinners such as  aspirin, Coumadin, Ticiid, Plavix, etc., or if he/she have some genetic predisposition  such as hemophilia. Bleeding into the spinal canal can cause compression of the spinal  cord with subsequent paralysis.  This would require an emergency surgery to  decompress and there are no guarantees that the patient would recover from the  paralysis.       5. Pneumothorax:  Puncturing of a lung is a possibility, every time a needle is introduced in  the area of the chest or upper back.  Pneumothorax refers to free air around the  collapsed lung(s), inside of the thoracic cavity (chest cavity).  Another two possible  complications related to a similar event would include: Hemothorax and Chylothorax.   These are variations of the Pneumothorax, where instead of air around the collapsed  lung(s), you may have blood or chyle, respectively.       6. Spinal headaches: They may occur with any procedures in the area of the spine.       7. Persistent CSF (Cerebro-Spinal Fluid) leakage: This is a rare problem, but may occur  with prolonged intrathecal or epidural catheters either due to the formation of a fistulous  track or a dural tear.       8. Nerve damage: By working so close to the spinal cord, there is always a possibility of  nerve damage, which could be as serious as a permanent spinal cord injury with  paralysis.       9. Death:  Although rare, severe deadly allergic reactions known as "Anaphylactic  reaction" can occur to any of the medications used.      10. Worsening of the symptoms:  We can always make thing  worse.  What are the chances of something like this happening? Chances of any of this occuring are extremely low.  By statistics, you have more of a chance of getting killed in a motor vehicle accident: while driving to the hospital than any of the above occurring .  Nevertheless, you should be aware that they are possibilities.  In general, it is similar to taking a shower.  Everybody knows that you can slip, hit your head and get killed.  Does that mean that you should not shower again?  Nevertheless always keep in mind that statistics do not mean anything if you happen to be on the wrong side of them.  Even if a procedure has a 1 (one) in a 1,000,000 (million) chance of going wrong, it you happen to be that one..Also, keep in mind that by statistics, you have more of a chance of having   something go wrong when taking medications.  Who should not have this procedure? If you are on a blood thinning medication (e.g. Coumadin, Plavix, see list of "Blood Thinners"), or if you have an active infection going on, you should not have the procedure.  If you are taking any blood thinners, please inform your physician.  How should I prepare for this procedure?  Do not eat or drink anything at least six hours prior to the procedure.  Bring a driver with you .  It cannot be a taxi.  Come accompanied by an adult that can drive you back, and that is strong enough to help you if your legs get weak or numb from the local anesthetic.  Take all of your medicines the morning of the procedure with just enough water to swallow them.  If you have diabetes, make sure that you are scheduled to have your procedure done first thing in the morning, whenever possible.  If you have diabetes, take only half of your insulin dose and notify our nurse that you have done so as soon as you arrive at the clinic.  If you are diabetic, but only take blood sugar pills (oral hypoglycemic), then do not take them on the morning of your  procedure.  You may take them after you have had the procedure.  Do not take aspirin or any aspirin-containing medications, at least eleven (11) days prior to the procedure.  They may prolong bleeding.  Wear loose fitting clothing that may be easy to take off and that you would not mind if it got stained with Betadine or blood.  Do not wear any jewelry or perfume  Remove any nail coloring.  It will interfere with some of our monitoring equipment.  NOTE: Remember that this is not meant to be interpreted as a complete list of all possible complications.  Unforeseen problems may occur.  BLOOD THINNERS The following drugs contain aspirin or other products, which can cause increased bleeding during surgery and should not be taken for 2 weeks prior to and 1 week after surgery.  If you should need take something for relief of minor pain, you may take acetaminophen which is found in Tylenol,m Datril, Anacin-3 and Panadol. It is not blood thinner. The products listed below are.  Do not take any of the products listed below in addition to any listed on your instruction sheet.  A.P.C or A.P.C with Codeine Codeine Phosphate Capsules #3 Ibuprofen Ridaura  ABC compound Congesprin Imuran rimadil  Advil Cope Indocin Robaxisal  Alka-Seltzer Effervescent Pain Reliever and Antacid Coricidin or Coricidin-D  Indomethacin Rufen  Alka-Seltzer plus Cold Medicine Cosprin Ketoprofen S-A-C Tablets  Anacin Analgesic Tablets or Capsules Coumadin Korlgesic Salflex  Anacin Extra Strength Analgesic tablets or capsules CP-2 Tablets Lanoril Salicylate  Anaprox Cuprimine Capsules Levenox Salocol  Anexsia-D Dalteparin Magan Salsalate  Anodynos Darvon compound Magnesium Salicylate Sine-off  Ansaid Dasin Capsules Magsal Sodium Salicylate  Anturane Depen Capsules Marnal Soma  APF Arthritis pain formula Dewitt's Pills Measurin Stanback  Argesic Dia-Gesic Meclofenamic Sulfinpyrazone  Arthritis Bayer Timed Release Aspirin  Diclofenac Meclomen Sulindac  Arthritis pain formula Anacin Dicumarol Medipren Supac  Analgesic (Safety coated) Arthralgen Diffunasal Mefanamic Suprofen  Arthritis Strength Bufferin Dihydrocodeine Mepro Compound Suprol  Arthropan liquid Dopirydamole Methcarbomol with Aspirin Synalgos  ASA tablets/Enseals Disalcid Micrainin Tagament  Ascriptin Doan's Midol Talwin  Ascriptin A/D Dolene Mobidin Tanderil  Ascriptin Extra Strength Dolobid Moblgesic Ticlid  Ascriptin with Codeine Doloprin or Doloprin with Codeine Momentum Tolectin  Asperbuf Duoprin Mono-gesic Trendar  Aspergum Duradyne Motrin or Motrin IB Triminicin  Aspirin plain, buffered or enteric coated Durasal Myochrisine Trigesic  Aspirin Suppositories Easprin Nalfon Trillsate  Aspirin with Codeine Ecotrin Regular or Extra Strength Naprosyn Uracel  Atromid-S Efficin Naproxen Ursinus  Auranofin Capsules Elmiron Neocylate Vanquish  Axotal Emagrin Norgesic Verin  Azathioprine Empirin or Empirin with Codeine Normiflo Vitamin E  Azolid Emprazil Nuprin Voltaren  Bayer Aspirin plain, buffered or children's or timed BC Tablets or powders Encaprin Orgaran Warfarin Sodium  Buff-a-Comp Enoxaparin Orudis Zorpin  Buff-a-Comp with Codeine Equegesic Os-Cal-Gesic   Buffaprin Excedrin plain, buffered or Extra Strength Oxalid   Bufferin Arthritis Strength Feldene Oxphenbutazone   Bufferin plain or Extra Strength Feldene Capsules Oxycodone with Aspirin   Bufferin with Codeine Fenoprofen Fenoprofen Pabalate or Pabalate-SF   Buffets II Flogesic Panagesic   Buffinol plain or Extra Strength Florinal or Florinal with Codeine Panwarfarin   Buf-Tabs Flurbiprofen Penicillamine   Butalbital Compound Four-way cold tablets Penicillin   Butazolidin Fragmin Pepto-Bismol   Carbenicillin Geminisyn Percodan   Carna Arthritis Reliever Geopen Persantine   Carprofen Gold's salt Persistin   Chloramphenicol Goody's Phenylbutazone   Chloromycetin Haltrain Piroxlcam    Clmetidine heparin Plaquenil   Cllnoril Hyco-pap Ponstel   Clofibrate Hydroxy chloroquine Propoxyphen         Before stopping any of these medications, be sure to consult the physician who ordered them.  Some, such as Coumadin (Warfarin) are ordered to prevent or treat serious conditions such as "deep thrombosis", "pumonary embolisms", and other heart problems.  The amount of time that you may need off of the medication may also vary with the medication and the reason for which you were taking it.  If you are taking any of these medications, please make sure you notify your pain physician before you undergo any procedures.         Epidural Steroid Injection Patient Information  Description: The epidural space surrounds the nerves as they exit the spinal cord.  In some patients, the nerves can be compressed and inflamed by a bulging disc or a tight spinal canal (spinal stenosis).  By injecting steroids into the epidural space, we can bring irritated nerves into direct contact with a potentially helpful medication.  These steroids act directly on the irritated nerves and can reduce swelling and inflammation which often leads to decreased pain.  Epidural steroids may be injected anywhere along the spine and from the neck to the low back depending upon the location of your pain.   After numbing the skin with local anesthetic (like Novocaine), a small needle is passed into the epidural space slowly.  You may experience a sensation of pressure while this is being done.  The entire block usually last less than 10 minutes.  Conditions which may be treated by epidural steroids:   Low back and leg pain  Neck and arm pain  Spinal stenosis  Post-laminectomy syndrome  Herpes zoster (shingles) pain  Pain from compression fractures  Preparation for the injection:  1. Do not eat any solid food or dairy products within 8 hours of your appointment.  2. You may drink clear liquids up to 3 hours  before appointment.  Clear liquids include water, black coffee, juice or soda.  No milk or cream please. 3. You may take your regular medication, including pain medications, with a sip of water before your appointment  Diabetics should hold regular insulin (if taken separately) and take 1/2 normal NPH dos the morning   of the procedure.  Carry some sugar containing items with you to your appointment. 4. A driver must accompany you and be prepared to drive you home after your procedure.  5. Bring all your current medications with your. 6. An IV may be inserted and sedation may be given at the discretion of the physician.   7. A blood pressure cuff, EKG and other monitors will often be applied during the procedure.  Some patients may need to have extra oxygen administered for a short period. 8. You will be asked to provide medical information, including your allergies, prior to the procedure.  We must know immediately if you are taking blood thinners (like Coumadin/Warfarin)  Or if you are allergic to IV iodine contrast (dye). We must know if you could possible be pregnant.  Possible side-effects:  Bleeding from needle site  Infection (rare, may require surgery)  Nerve injury (rare)  Numbness & tingling (temporary)  Difficulty urinating (rare, temporary)  Spinal headache ( a headache worse with upright posture)  Light -headedness (temporary)  Pain at injection site (several days)  Decreased blood pressure (temporary)  Weakness in arm/leg (temporary)  Pressure sensation in back/neck (temporary)  Call if you experience:  Fever/chills associated with headache or increased back/neck pain.  Headache worsened by an upright position.  New onset weakness or numbness of an extremity below the injection site  Hives or difficulty breathing (go to the emergency room)  Inflammation or drainage at the infection site  Severe back/neck pain  Any new symptoms which are concerning to  you  Please note:  Although the local anesthetic injected can often make your back or neck feel good for several hours after the injection, the pain will likely return.  It takes 3-7 days for steroids to work in the epidural space.  You may not notice any pain relief for at least that one week.  If effective, we will often do a series of three injections spaced 3-6 weeks apart to maximally decrease your pain.  After the initial series, we generally will wait several months before considering a repeat injection of the same type.  If you have any questions, please call [phone] [facility]  Gabapentin Titration  Medication used: Gabapentin (Generic Name) or Neurontin (Brand Name) 300 mg tablets/capsules  Reasons to stop increasing the dose:  Reason 1: You get good relief of symptoms, in which case there is no need to increase the daily dose any further.    Reason 2: You develop some side effects, such as sleeping all of the time, difficulty concentrating, or becoming disoriented, in which case you need to go down on the dose, to the prior level, where you were not experiencing any side effects. Stay on that dose longer, to allow more time for your body to get use it, before attempting to increase it again.   Reasons to stop increasing the dose: Reason 1: You get good relief of symptoms, in which case there is no need to increase the daily dose any further.  Reason 2: You develop some side effects, such as sleeping all of the time, difficulty concentrating, or becoming disoriented, in which case you need to go down on the dose, to the prior level, where you were not experiencing any side effects. Stay on that dose longer, to allow more time for your body to get use it, before attempting to increase it again.  Steps to increase medication: Step 1: Start by taking 1 (  one) tablet at bedtime x 7 (seven) days.  Step 2: After 7 (seven) days of taking 1 (one)  tablet at bedtime, increase it to 2 (two) tablets at bedtime. Stay on this dose x 7 (seven) days.  Step 3: After 7 (seven) days of taking 2 (two) tablets at bedtime, increase it to 3 (three) tablets at bedtime. Stay on this dose x another 7 (seven) days.  Step 4: After 7 (seven) days of taking 3 (three) tablet at bedtime, begin taking 1 (one) tablet at noon with lunch. Stay on this dose x another 7 (seven) days.  Step 5: After 7 (seven) days of taking 3 (three) tablet at bedtime, and 1 (one) tablet at noon, then begin taking 1 (one) tablet in the afternoon with dinner. Stay on this dose x another 7 (seven) days.  Step 6: After 7 (seven) days of taking 3 (three) tablet at bedtime, 1 (one) tablet at noon, and 1 (one) tablet in the afternoon, then begin taking 1 (one) tablet in the morning with breakfast. Stay on this dose x another 7 (seven) days. At this point you should be taking the medicine 4 (four) times a day, or about every 6 (six) hours. This daily regimen of taking the medicine 4 (four) times a day, will be maintained from now on. You should not take any doses any sooner than every 6 (six) hours.  Step 7: After 7 (seven) days of taking 3 (three) tablet at bedtime, 1 (one) tablet at noon, 1 (one) tablet in the afternoon, and 1 (one) tablet in the morning, begin taking 2 (two) tablets at noon with lunch. Stay on this dose x another 7 (seven) days.   Step 8: After 7 (seven) days of taking 3 (three) tablet at bedtime, 2 (two) tablets at noon, 1 (one) tablet in the afternoon, and 1 (one) tablet in the morning, begin taking 2 (two) tablets in the afternoon with dinner. Stay on this dose x another 7 (seven) days.   Step 9: After 7 (seven) days of taking 3 (three) tablet at bedtime, 2 (two) tablets at noon, 2 (two) tablets in the afternoon, and 1 (one) tablet in the morning, begin taking 2 (two) tablets in the morning with breakfast. Stay on this dose x another 7 (seven) days. At this point you should  be taking the medicine 4 (four) times a day, or about every 6 (six) hours. This daily regimen of taking the medicine 4 (four) times a day, will be maintained from now on. You should not take any doses any sooner than every 6 (six) hours.  Step 10: After 7 (seven) days of taking 3 (three) tablet at bedtime, 2 (two) tablets at noon, 2 (two) tablets in the afternoon, and 2 (two) tablets in the morning, begin taking 3 (three) tablets at noon with lunch. Stay on this dose x another 7 (seven) days.   Step 11: After 7 (seven) days of taking 3 (three) tablet at bedtime, 3 (three) tablets at noon, 2 (two) tablets in the afternoon, and 2 (two) tablets in the morning, begin taking 3 (three) tablets in the afternoon with dinner. Stay on this dose x another 7 (seven) days.   Step 12: After 7 (seven) days of taking 3 (three) tablet at bedtime, 3 (three) tablets at noon, 3 (three) tablets in the afternoon, and 2 (two) tablet in the morning, begin taking 3 (three) tablets in the morning with breakfast. Stay on this dose x another 7 (seven) days. At  this point you should be taking the medicine 4 (four) times a day, or about every 6 (six) hours. This daily regimen of taking the medicine 4 (four) times a day, will be maintained from now on.   Endpoint: Once you have reached the maximum dose you can tolerate without side-effects, contact your physician so as to evaluate the results of the regimen.   Questions: Feel free to contact us for any questions or problems at [phone]

## 2016-12-24 NOTE — Progress Notes (Signed)
Patient's Name: Stephanie [name]  MRN: [id number]  Referring Provider: Clarisse Gouge, MD  DOB: Dec 22, 1960  PCP: Clarisse Gouge, MD  DOS: 12/24/2016  Note by: Kathlen Brunswick. Dossie Arbour, MD  Service setting: Ambulatory outpatient  Specialty: Interventional Pain Management  Location: [facility] (AMB) Pain Management Facility    Patient type: Established   Primary Reason(s) for Visit: Encounter for prescription drug management (Level of risk: moderate) CC: Back Pain (lower)  HPI  Stephanie [name] is a 56 y.o. year old, female patient, who comes today for a medication management evaluation. 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 Her primarily concern today is  the Back Pain (lower)  Pain Assessment: Self-Reported Pain Score: 6 /10 Clinically the patient looks like a 2/10 Reported level is inconsistent with clinical observations. Information on the proper use of the pain scale provided to the patient today Pain Type: Chronic pain Pain Location: Back Pain Orientation: Lower Pain Descriptors / Indicators: Burning, Throbbing, Tingling Pain Frequency: Constant  Stephanie [name] was last seen on 08/03/16 for medication management. During today's appointment we reviewed [name]'s chronic pain status, as well as her outpatient medication regimen. This patient was last seen here 142 days ago. She was last provided with 180 tablets of hydrocodone meaning that her actual use is approximately 1.2 tablets per day. Today the patient was informed about the healthcare changes that are occurring as a consequence of the new opioid regulations. She indicates understanding. At this point we plan to attempt bringing her pain down by using interventional techniques so as to minimize the use of controlled substances. Today the patient was informed about the plan including the risks and possible complications.  The patient  reports that she does not use drugs. Her body mass index is 33.52 kg/m.  Further details on both, my assessment(s), as well as the proposed treatment plan, please see below.  Controlled Substance Pharmacotherapy Assessment REMS (Risk Evaluation and Mitigation Strategy)  Analgesic:Hydrocodone/APAP 10/325 one and a half tablets per day (15 mg/day of hydrocodone)  MME/day:15 mg/day. [name], RN  12/24/2016  8:11 AM  Sign at close encounter Nursing Pain Medication Assessment:  Safety precautions to be maintained throughout the outpatient stay will include: orient to surroundings, keep bed in low position, maintain call bell within reach at all times, provide assistance with transfer out of bed and ambulation.  Medication Inspection Compliance: Ms.  Stephanie [name] did not comply with our request to bring her pills to be  counted. She was reminded that bringing the medication bottles, even when empty, is a requirement. Pill/Patch Count: None available to be counted. Bottle Appearance: No container available. Did not bring bottle(s) to appointment. Medication: None brought in. Filled Date: N/A Last Medication intake:  Ran out of medicine more than 48 hours ago   Pharmacokinetics: Liberation and absorption (onset of action): WNL Distribution (time to peak effect): WNL Metabolism and excretion (duration of action): WNL         Pharmacodynamics: Desired effects: Analgesia: Stephanie [name] reports >50% benefit. Functional ability: Patient reports that medication allows her to accomplish basic ADLs Clinically meaningful improvement in function (CMIF): Sustained CMIF goals met Perceived effectiveness: Described as relatively effective, allowing for increase in activities of daily living (ADL) Undesirable effects: Side-effects or Adverse reactions: None reported Monitoring: [place] PMP: Online review of the past 93-monthperiod conducted. Compliant with practice rules and regulations List of all UDS test(s) done:  Lab Results  Component Value Date   TOXASSSELUR FINAL 05/07/2016   TOXASSSELUR FINAL 02/12/2016   TOXASSSELUR FINAL 11/18/2015   Last UDS on record: ToxAssure Select 13  Date Value Ref Range Status  05/07/2016 FINAL  Final    Comment:    ==================================================================== TOXASSURE SELECT 13 (MW) ==================================================================== Test                             Result       Flag       Units Drug Present and Declared for Prescription Verification   Hydrocodone                    4034         EXPECTED   ng/mg creat   Hydromorphone                  443          EXPECTED   ng/mg creat   Dihydrocodeine                 143          EXPECTED   ng/mg creat   Norhydrocodone                  >2809        EXPECTED   ng/mg creat    Sources of hydrocodone include scheduled prescription    medications. Hydromorphone, dihydrocodeine and norhydrocodone are    expected metabolites of hydrocodone. Hydromorphone and    dihydrocodeine are also available as scheduled prescription    medications. ==================================================================== Test                      Result    Flag   Units      Ref Range   Creatinine              178              mg/dL      >=20 ==================================================================== Declared Medications:  The flagging and interpretation on this report are based on the  following declared medications.  Unexpected results may arise from  inaccuracies in the declared medications.  **Note: The testing scope of this panel includes these medications:  Hydrocodone (Norco)  **Note: The testing scope of this panel does not include following  reported medications:  Acetaminophen (Norco)  Cyclobenzaprine (Flexeril)  Gabapentin  Melatonin  Meloxicam (Mobic)  Vitamin D3 ==================================================================== For clinical  consultation, please call [phone]. ====================================================================    UDS interpretation: Compliant          Medication Assessment Form: Reviewed. Patient indicates being compliant with therapy Treatment compliance: Compliant Risk Assessment Profile: Aberrant behavior: See prior evaluations. None observed or detected today Comorbid factors increasing risk of overdose: See prior notes. No additional risks detected today Risk of substance use disorder (SUD): Low Opioid Risk Tool (ORT) Total Score:    Interpretation Table:  Score <3 = Low Risk for SUD  Score between 4-7 = Moderate Risk for SUD  Score >8 = High Risk for Opioid Abuse   Risk Mitigation Strategies:  Patient Counseling: Covered Patient-Prescriber  Agreement (PPA): Present and active  Notification to other healthcare providers: Done  Pharmacologic Plan: No change in therapy, at this time  Laboratory Chemistry  Inflammation Markers Lab Results  Component Value Date   ESRSEDRATE 27 11/18/2015   CRP 0.7 11/18/2015   Renal Function Lab Results  Component Value Date   BUN 9 11/18/2015   CREATININE 0.59 11/18/2015   GFRAA >60 11/18/2015   GFRNONAA >60 11/18/2015   Hepatic Function Lab Results  Component Value Date   AST 18 11/18/2015   ALT 14 11/18/2015   ALBUMIN 4.0 11/18/2015   Electrolytes Lab Results  Component Value Date   NA 141 11/18/2015   K 4.2 11/18/2015   CL 109 11/18/2015   CALCIUM 9.0 11/18/2015   MG 2.0 11/18/2015   Pain Modulating Vitamins Lab Results  Component Value Date   25OHVITD1 34 05/07/2016   25OHVITD2 <1.0 05/07/2016   25OHVITD3 34 05/07/2016   VITAMINB12 318 05/07/2016   Coagulation Parameters Lab Results  Component Value Date   PLT 298 04/10/2014   Cardiovascular Lab Results  Component Value Date   HGB 8.7 (L) 04/10/2014   HCT 28.6 (L) 04/10/2014   Note: Lab results reviewed.  Recent Diagnostic Imaging Review  No results found. Note: Imaging results reviewed.          Meds  The patient has a current medication list which includes the following prescription(s): cyclobenzaprine, gabapentin, hydrocodone-acetaminophen, hydrocodone-acetaminophen, hydrocodone-acetaminophen, meloxicam, trazodone, and triamcinolone ointment.  No current outpatient prescriptions on file prior to visit.   No current facility-administered medications on file prior to visit.    ROS  Constitutional: Denies any fever or chills Gastrointestinal: No reported hemesis, hematochezia, vomiting, or acute GI distress Musculoskeletal: Denies any acute onset joint swelling, redness, loss of ROM, or weakness Neurological: No reported episodes of acute onset apraxia, aphasia, dysarthria, agnosia, amnesia,  paralysis, loss of coordination, or loss of consciousness  Allergies  Stephanie [name] is allergic to phenergan [promethazine hcl].  PFSH  Drug: Stephanie [name]  reports that she does not use drugs. Alcohol:  reports that she does not drink alcohol. Tobacco:  reports that she has never smoked. She has never used smokeless tobacco. Medical:  has a past medical history of Allergy; Anemia, iron deficiency (04/09/2014); Arthritis; Atypical chest pain (04/09/2014); CD (contact dermatitis) (03/01/2014); Chest pain (04/09/2014); and Lumbar radicular pain (08/22/2015). Family: family history includes Asthma in her father; Stroke in her mother.  Past Surgical History:  Procedure Laterality Date  . APPENDECTOMY    . BACK SURGERY    . BUNIONECTOMY     BOTH FEET  . CESAREAN SECTION     X 2  . CHOLECYSTECTOMY     Constitutional Exam  General appearance: Well nourished, well developed, and well hydrated. In no apparent acute distress Vitals:   12/24/16  0804  BP: 137/83  Pulse: (!) 55  Resp: 16  Temp: 98.3 F (36.8 C)  TempSrc: Oral  SpO2: 96%  Weight: 214 lb (97.1 kg)  Height: 5' 7"  (1.702 m)   BMI Assessment: Estimated body mass index is 33.52 kg/m as calculated from the following:   Height as of this encounter: 5' 7"  (1.702 m).   Weight as of this encounter: 214 lb (97.1 kg).  BMI interpretation table: BMI level Category Range association with higher incidence of chronic pain  <18 kg/m2 Underweight   18.5-24.9 kg/m2 Ideal body weight   25-29.9 kg/m2 Overweight Increased incidence by 20%  30-34.9 kg/m2 Obese (Class I) Increased incidence by 68%  35-39.9 kg/m2 Severe obesity (Class II) Increased incidence by 136%  >40 kg/m2 Extreme obesity (Class III) Increased incidence by 254%   BMI Readings from Last 4 Encounters:  12/24/16 33.52 kg/m  08/03/16 33.67 kg/m  05/07/16 33.67 kg/m  04/15/16 33.67 kg/m   Wt Readings from Last 4 Encounters:  12/24/16 214 lb (97.1 kg)  08/03/16 215 lb  (97.5 kg)  05/07/16 215 lb (97.5 kg)  04/15/16 215 lb (97.5 kg)  Psych/Mental status: Alert, oriented x 3 (person, place, & time)       Eyes: PERLA Respiratory: No evidence of acute respiratory distress  Cervical Spine Exam  Inspection: No masses, redness, or swelling Alignment: Symmetrical Functional ROM: Unrestricted ROM Stability: No instability detected Muscle strength & Tone: Functionally intact Sensory: Unimpaired Palpation: Non-contributory  Upper Extremity (UE) Exam    Side: Right upper extremity  Side: Left upper extremity  Inspection: No masses, redness, swelling, or asymmetry. No contractures  Inspection: No masses, redness, swelling, or asymmetry. No contractures  Functional ROM: Unrestricted ROM          Functional ROM: Unrestricted ROM          Muscle strength & Tone: Functionally intact  Muscle strength & Tone: Functionally intact  Sensory: Unimpaired  Sensory: Unimpaired  Palpation: Euthermic  Palpation: Euthermic  Specialized Test(s): Deferred         Specialized Test(s): Deferred          Thoracic Spine Exam  Inspection: No masses, redness, or swelling Alignment: Symmetrical Functional ROM: Unrestricted ROM Stability: No instability detected Sensory: Unimpaired Muscle strength & Tone: Functionally intact Palpation: Non-contributory  Lumbar Spine Exam  Inspection: No masses, redness, or swelling Alignment: Symmetrical Functional ROM: Unrestricted ROM Stability: No instability detected Muscle strength & Tone: Functionally intact Sensory: Unimpaired Palpation: Non-contributory Provocative Tests: Lumbar Hyperextension and rotation test: evaluation deferred today       Patrick's Maneuver: evaluation deferred today              Gait & Posture Assessment  Ambulation: Unassisted Gait: Relatively normal for age and body habitus Posture: WNL   Lower Extremity Exam    Side: Right lower extremity  Side: Left lower extremity  Inspection: No masses, redness,  swelling, or asymmetry. No contractures  Inspection: No masses, redness, swelling, or asymmetry. No contractures  Functional ROM: Unrestricted ROM          Functional ROM: Unrestricted ROM          Muscle strength & Tone: Functionally intact  Muscle strength & Tone: Functionally intact  Sensory: Unimpaired  Sensory: Unimpaired  Palpation: No palpable anomalies  Palpation: No palpable anomalies   Assessment  Primary Diagnosis & Pertinent Problem List: The primary encounter diagnosis was Chronic pain syndrome. 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 Evlyn Clines, [facility]), Epidural fibrosis, Long term current use of opiate analgesic, Opiate use, Musculoskeletal pain, Spasm of paraspinal muscle, Neurogenic pain, Spondylolisthesis of lumbar region, and Insomnia secondary to chronic pain were also pertinent to this visit.  Status Diagnosis  Controlled Unimproved Unimproved 1. Chronic pain syndrome   2. Chronic low back pain (Location of Primary Source of Pain) (Bilateral) (L>R)   3. Chronic lower extremity pain (Location of Secondary source of pain) (Bilateral) (L>R)   4. Chronic knee pain (Location of Tertiary source of pain) (Bilateral) (R>L)   5. 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 Evlyn Clines, [facility])   7. Epidural fibrosis   8. Long term current use of opiate analgesic   9. Opiate use   10. Musculoskeletal pain   11. Spasm of paraspinal muscle   12. Neurogenic pain   13. Spondylolisthesis of lumbar region   14. Insomnia secondary to  chronic pain      Plan of Care  Pharmacotherapy (Medications Ordered): Meds ordered this encounter  Medications  . cyclobenzaprine (FLEXERIL) 10 MG tablet    Sig: Take 1 tablet (10 mg total) by mouth daily.    Dispense:  90 tablet    Refill:  0    Do not place this medication, or any other prescription from our practice, on "Automatic Refill". Patient may have prescription filled one day early if pharmacy is closed on scheduled refill date.  . gabapentin (NEURONTIN) 300 MG capsule    Sig: Take 1-3 capsules (300-900 mg total) by mouth 4 (four) times daily.    Dispense:  360 capsule    Refill:  2    Do not place this medication, or any other prescription from our practice, on "Automatic Refill". Patient may have prescription filled one day early if pharmacy is closed on scheduled refill date.  . meloxicam (MOBIC) 15 MG tablet    Sig: Take 1 tablet (15 mg total) by mouth every morning.    Dispense:  90 tablet    Refill:  0    Do not place this medication, or any other prescription from our practice, on "Automatic Refill". Patient may have prescription filled one day early if pharmacy is closed on scheduled refill date.  . traZODone (DESYREL) 100 MG tablet    Sig: Take 0.5 tablets (50 mg total) by mouth at bedtime as needed for sleep.    Dispense:  45 tablet    Refill:  0    Do not place medication on "Automatic Refill". Fill one day early if pharmacy is closed on scheduled refill date.  Marland Kitchen HYDROcodone-acetaminophen (NORCO) 10-325 MG tablet    Sig: Take 1 tablet by mouth every 12 (twelve) hours as needed for severe pain.    Dispense:  45 tablet    Refill:  0    Do not place this medication, or any other prescription from our practice, on "Automatic Refill". Patient may have prescription filled one day early if pharmacy is closed on scheduled refill date. Do not fill until: 12/24/16 To last until: 01/23/17  . HYDROcodone-acetaminophen (NORCO) 10-325 MG tablet  Sig: Take 1 tablet by  mouth every 12 (twelve) hours as needed for severe pain.    Dispense:  45 tablet    Refill:  0    Do not place this medication, or any other prescription from our practice, on "Automatic Refill". Patient may have prescription filled one day early if pharmacy is closed on scheduled refill date. Do not fill until: 01/23/17 To last until: 02/22/17  . HYDROcodone-acetaminophen (NORCO) 10-325 MG tablet    Sig: Take 1 tablet by mouth every 12 (twelve) hours as needed for severe pain.    Dispense:  45 tablet    Refill:  0    Do not place this medication, or any other prescription from our practice, on "Automatic Refill". Patient may have prescription filled one day early if pharmacy is closed on scheduled refill date. Do not fill until: 02/22/17 To last until: 03/24/17   New Prescriptions   No medications on file   Medications administered today: Stephanie [name] had no medications administered during this visit. Lab-work, procedure(s), and/or referral(s): Orders Placed This Encounter  Procedures  . Caudal Epidural Injection  . ToxASSURE Select 13 (MW), Urine   Imaging and/or referral(s): None  Interventional therapies: Planned, scheduled, and/or pending:   Diagnostic Caudal ESI + Epidurogram   Considering:  Diagnostic bilateral lumbar facet block Possible bilateral lumbar facet RFA Diagnostic Caudal ESI Diagnostic Bilateral L4-5 & L5-S1Lumbar TFESI Diagnostic bilateral sacroiliac joint block Possible bilateral sacroiliac joint RFA Diagnostic bilateral hip joint injection Possible bilateral hip joint RFA Diagnostic bilateral knee joint injection Possible series of Hyalgan knee injections Diagnostic bilateral genicular nerve block Possible bilateral genicular nerve RFA.   Palliative PRN treatment(s):   Diagnostic bilateral lumbar facet block. Diagnostic Caudal ESI. Diagnostic Bilateral L4-5 & L5-S1Lumbar TFESI. Diagnostic bilateral sacroiliac joint block. Diagnostic bilateral  hip joint injection. Diagnostic bilateral knee joint injection. Possible series of Hyalgan knee injections. Diagnostic bilateral genicular nerve block.   Provider-requested follow-up: Return in about 3 months (around 03/23/2017) for (MD) Med-Mgmt, in addition, procedure (ASAP).  Future Appointments Date Time Provider [place]  03/23/2017 7:45 AM Milinda Pointer, MD [facility] None   Primary Care Physician: Clarisse Gouge, MD Location: [facility] Note by: Kathlen Brunswick. Dossie Arbour, M.D, DABA, DABAPM, DABPM, DABIPP, FIPP Date: 12/24/2016; Time: 10:38 AM  Pain Score Disclaimer: We use the NRS-11 scale. This is a self-reported, subjective measurement of pain severity with only modest accuracy. It is used primarily to identify changes within a particular patient. It must be understood that outpatient pain scales are significantly less accurate that those used for research, where they can be applied under ideal controlled circumstances with minimal exposure to variables. In reality, the score is likely to be a combination of pain intensity and pain affect, where pain affect describes the degree of emotional arousal or changes in action readiness caused by the sensory experience of pain. Factors such as social and work situation, setting, emotional state, anxiety levels, expectation, and prior pain experience may influence pain perception and show large inter-individual differences that may also be affected by time variables.  Patient instructions provided during this appointment: Patient Instructions   Pain Score  Introduction: The pain score used by this practice is the Verbal Numerical Rating Scale (VNRS-11). This is an 11-point scale. It is for adults and children 10 years or older. There are significant differences in how the pain score is reported, used, and applied. Forget everything you learned in the past and learn this  scoring system.  General  Information: The scale should reflect your current level of pain. Unless you are specifically asked for the level of your worst pain, or your average pain. If you are asked for one of these two, then it should be understood that it is over the past 24 hours.  Basic Activities of Daily Living (ADL): Personal hygiene, dressing, eating, transferring, and using restroom.  Instructions: Most patients tend to report their level of pain as a combination of two factors, their physical pain and their psychosocial pain. This last one is also known as "suffering" and it is reflection of how physical pain affects you socially and psychologically. From now on, report them separately. From this point on, when asked to report your pain level, report only your physical pain. Use the following table for reference.  Pain Clinic Pain Levels (0-5/10)  Pain Level Score Description  No Pain 0   Mild pain 1 Nagging, annoying, but does not interfere with basic activities of daily living (ADL). Patients are able to eat, bathe, get dressed, toileting (being able to get on and off the toilet and perform personal hygiene functions), transfer (move in and out of bed or a chair without assistance), and maintain continence (able to control bladder and bowel functions). Blood pressure and heart rate are unaffected. A normal heart rate for a healthy adult ranges from 60 to 100 bpm (beats per minute).   Mild to moderate pain 2 Noticeable and distracting. Impossible to hide from other people. More frequent flare-ups. Still possible to adapt and function close to normal. It can be very annoying and may have occasional stronger flare-ups. With discipline, patients may get used to it and adapt.   Moderate pain 3 Interferes significantly with activities of daily living (ADL). It becomes difficult to feed, bathe, get dressed, get on and off the toilet or to perform personal hygiene functions. Difficult to get in and out of bed or a chair  without assistance. Very distracting. With effort, it can be ignored when deeply involved in activities.   Moderately severe pain 4 Impossible to ignore for more than a few minutes. With effort, patients may still be able to manage work or participate in some social activities. Very difficult to concentrate. Signs of autonomic nervous system discharge are evident: dilated pupils (mydriasis); mild sweating (diaphoresis); sleep interference. Heart rate becomes elevated (>115 bpm). Diastolic blood pressure (lower number) rises above 100 mmHg. Patients find relief in laying down and not moving.   Severe pain 5 Intense and extremely unpleasant. Associated with frowning face and frequent crying. Pain overwhelms the senses.  Ability to do any activity or maintain social relationships becomes significantly limited. Conversation becomes difficult. Pacing back and forth is common, as getting into a comfortable position is nearly impossible. Pain wakes you up from deep sleep. Physical signs will be obvious: pupillary dilation; increased sweating; goosebumps; brisk reflexes; cold, clammy hands and feet; nausea, vomiting or dry heaves; loss of appetite; significant sleep disturbance with inability to fall asleep or to remain asleep. When persistent, significant weight loss is observed due to the complete loss of appetite and sleep deprivation.  Blood pressure and heart rate becomes significantly elevated. Caution: If elevated blood pressure triggers a pounding headache associated with blurred vision, then the patient should immediately seek attention at an urgent or emergency care unit, as these may be signs of an impending stroke.    Emergency Department Pain Levels (6-10/10)  Emergency Room Pain 6 Severely limiting. Requires  emergency care and should not be seen or managed at an outpatient pain management facility. Communication becomes difficult and requires great effort. Assistance to reach the emergency department  may be required. Facial flushing and profuse sweating along with potentially dangerous increases in heart rate and blood pressure will be evident.   Distressing pain 7 Self-care is very difficult. Assistance is required to transport, or use restroom. Assistance to reach the emergency department will be required. Tasks requiring coordination, such as bathing and getting dressed become very difficult.   Disabling pain 8 Self-care is no longer possible. At this level, pain is disabling. The individual is unable to do even the most "basic" activities such as walking, eating, bathing, dressing, transferring to a bed, or toileting. Fine motor skills are lost. It is difficult to think clearly.   Incapacitating pain 9 Pain becomes incapacitating. Thought processing is no longer possible. Difficult to remember your own name. Control of movement and coordination are lost.   The worst pain imaginable 10 At this level, most patients pass out from pain. When this level is reached, collapse of the autonomic nervous system occurs, leading to a sudden drop in blood pressure and heart rate. This in turn results in a temporary and dramatic drop in blood flow to the brain, leading to a loss of consciousness. Fainting is one of the body's self defense mechanisms. Passing out puts the brain in a calmed state and causes it to shut down for a while, in order to begin the healing process.    Summary: 1. Refer to this scale when providing [place] with your pain level. 2. Be accurate and careful when reporting your pain level. This will help with your care. 3. Over-reporting your pain level will lead to loss of credibility. 4. Even a level of 1/10 means that there is pain and will be treated at our facility. 5. High, inaccurate reporting will be documented as "Symptom Exaggeration", leading to loss of credibility and suspicions of possible secondary gains such as obtaining more narcotics, or wanting to appear disabled, for  fraudulent reasons. 6. Only pain levels of 5 or below will be seen at our facility. 7. Pain levels of 6 and above will be sent to the Emergency Department and the appointment cancelled. _____________________________________________________________________________________________  GENERAL RISKS AND COMPLICATIONS  What are the risk, side effects and possible complications? Generally speaking, most procedures are safe.  However, with any procedure there are risks, side effects, and the possibility of complications.  The risks and complications are dependent upon the sites that are lesioned, or the type of nerve block to be performed.  The closer the procedure is to the spine, the more serious the risks are.  Great care is taken when placing the radio frequency needles, block needles or lesioning probes, but sometimes complications can occur. 1. Infection: Any time there is an injection through the skin, there is a risk of infection.  This is why sterile conditions are used for these blocks.  There are four possible types of infection. 1. Localized skin infection. 2. Central Nervous System Infection-This can be in the form of Meningitis, which can be deadly. 3. Epidural Infections-This can be in the form of an epidural abscess, which can cause pressure inside of the spine, causing compression of the spinal cord with subsequent paralysis. This would require an emergency surgery to decompress, and there are no guarantees that the patient would recover from the paralysis. 4. Discitis-This is an infection of the intervertebral discs.  It occurs in about 1% of discography procedures.  It is difficult to treat and it may lead to surgery.        2. Pain: the needles have to go through skin and soft tissues, will cause soreness.       3. Damage to internal structures:  The nerves to be lesioned may be near blood vessels or    other nerves which can be potentially damaged.       4. Bleeding: Bleeding is more  common if the patient is taking blood thinners such as  aspirin, Coumadin, Ticiid, Plavix, etc., or if he/she have some genetic predisposition  such as hemophilia. Bleeding into the spinal canal can cause compression of the spinal  cord with subsequent paralysis.  This would require an emergency surgery to  decompress and there are no guarantees that the patient would recover from the  paralysis.       5. Pneumothorax:  Puncturing of a lung is a possibility, every time a needle is introduced in  the area of the chest or upper back.  Pneumothorax refers to free air around the  collapsed lung(s), inside of the thoracic cavity (chest cavity).  Another two possible  complications related to a similar event would include: Hemothorax and Chylothorax.   These are variations of the Pneumothorax, where instead of air around the collapsed  lung(s), you may have blood or chyle, respectively.       6. Spinal headaches: They may occur with any procedures in the area of the spine.       7. Persistent CSF (Cerebro-Spinal Fluid) leakage: This is a rare problem, but may occur  with prolonged intrathecal or epidural catheters either due to the formation of a fistulous  track or a dural tear.       8. Nerve damage: By working so close to the spinal cord, there is always a possibility of  nerve damage, which could be as serious as a permanent spinal cord injury with  paralysis.       9. Death:  Although rare, severe deadly allergic reactions known as "Anaphylactic  reaction" can occur to any of the medications used.      10. Worsening of the symptoms:  We can always make thing worse.  What are the chances of something like this happening? Chances of any of this occuring are extremely low.  By statistics, you have more of a chance of getting killed in a motor vehicle accident: while driving to the hospital than any of the above occurring .  Nevertheless, you should be aware that they are possibilities.  In general, it is  similar to taking a shower.  Everybody knows that you can slip, hit your head and get killed.  Does that mean that you should not shower again?  Nevertheless always keep in mind that statistics do not mean anything if you happen to be on the wrong side of them.  Even if a procedure has a 1 (one) in a 1,000,000 (million) chance of going wrong, it you happen to be that one..Also, keep in mind that by statistics, you have more of a chance of having something go wrong when taking medications.  Who should not have this procedure? If you are on a blood thinning medication (e.g. Coumadin, Plavix, see list of "Blood Thinners"), or if you have an active infection going on, you should not have the procedure.  If you are taking any blood thinners, please inform your physician.  How should I prepare for this procedure?  Do not eat or drink anything at least six hours prior to the procedure.  Bring a driver with you .  It cannot be a taxi.  Come accompanied by an adult that can drive you back, and that is strong enough to help you if your legs get weak or numb from the local anesthetic.  Take all of your medicines the morning of the procedure with just enough water to swallow them.  If you have diabetes, make sure that you are scheduled to have your procedure done first thing in the morning, whenever possible.  If you have diabetes, take only half of your insulin dose and notify our nurse that you have done so as soon as you arrive at the clinic.  If you are diabetic, but only take blood sugar pills (oral hypoglycemic), then do not take them on the morning of your procedure.  You may take them after you have had the procedure.  Do not take aspirin or any aspirin-containing medications, at least eleven (11) days prior to the procedure.  They may prolong bleeding.  Wear loose fitting clothing that may be easy to take off and that you would not mind if it got stained with Betadine or blood.  Do not wear any  jewelry or perfume  Remove any nail coloring.  It will interfere with some of our monitoring equipment.  NOTE: Remember that this is not meant to be interpreted as a complete list of all possible complications.  Unforeseen problems may occur.  BLOOD THINNERS The following drugs contain aspirin or other products, which can cause increased bleeding during surgery and should not be taken for 2 weeks prior to and 1 week after surgery.  If you should need take something for relief of minor pain, you may take acetaminophen which is found in Tylenol,m Datril, Anacin-3 and Panadol. It is not blood thinner. The products listed below are.  Do not take any of the products listed below in addition to any listed on your instruction sheet.  A.P.C or A.P.C with Codeine Codeine Phosphate Capsules #3 Ibuprofen Ridaura  ABC compound Congesprin Imuran rimadil  Advil Cope Indocin Robaxisal  Alka-Seltzer Effervescent Pain Reliever and Antacid Coricidin or Coricidin-D  Indomethacin Rufen  Alka-Seltzer plus Cold Medicine Cosprin Ketoprofen S-A-C Tablets  Anacin Analgesic Tablets or Capsules Coumadin Korlgesic Salflex  Anacin Extra Strength Analgesic tablets or capsules CP-2 Tablets Lanoril Salicylate  Anaprox Cuprimine Capsules Levenox Salocol  Anexsia-D Dalteparin Magan Salsalate  Anodynos Darvon compound Magnesium Salicylate Sine-off  Ansaid Dasin Capsules Magsal Sodium Salicylate  Anturane Depen Capsules Marnal Soma  APF Arthritis pain formula Dewitt's Pills Measurin Stanback  Argesic Dia-Gesic Meclofenamic Sulfinpyrazone  Arthritis Bayer Timed Release Aspirin Diclofenac Meclomen Sulindac  Arthritis pain formula Anacin Dicumarol Medipren Supac  Analgesic (Safety coated) Arthralgen Diffunasal Mefanamic Suprofen  Arthritis Strength Bufferin Dihydrocodeine Mepro Compound Suprol  Arthropan liquid Dopirydamole Methcarbomol with Aspirin Synalgos  ASA tablets/Enseals Disalcid Micrainin Tagament  Ascriptin Doan's  Midol Talwin  Ascriptin A/D Dolene Mobidin Tanderil  Ascriptin Extra Strength Dolobid Moblgesic Ticlid  Ascriptin with Codeine Doloprin or Doloprin with Codeine Momentum Tolectin  Asperbuf Duoprin Mono-gesic Trendar  Aspergum Duradyne Motrin or Motrin IB Triminicin  Aspirin plain, buffered or enteric coated Durasal Myochrisine Trigesic  Aspirin Suppositories Easprin Nalfon Trillsate  Aspirin with Codeine Ecotrin Regular or Extra Strength Naprosyn Uracel  Atromid-S Efficin Naproxen Ursinus  Auranofin Capsules Elmiron Neocylate Vanquish  Axotal Emagrin Norgesic Verin  Azathioprine  Empirin or Empirin with Codeine Normiflo Vitamin E  Azolid Emprazil Nuprin Voltaren  Bayer Aspirin plain, buffered or children's or timed BC Tablets or powders Encaprin Orgaran Warfarin Sodium  Buff-a-Comp Enoxaparin Orudis Zorpin  Buff-a-Comp with Codeine Equegesic Os-Cal-Gesic   Buffaprin Excedrin plain, buffered or Extra Strength Oxalid   Bufferin Arthritis Strength Feldene Oxphenbutazone   Bufferin plain or Extra Strength Feldene Capsules Oxycodone with Aspirin   Bufferin with Codeine Fenoprofen Fenoprofen Pabalate or Pabalate-SF   Buffets II Flogesic Panagesic   Buffinol plain or Extra Strength Florinal or Florinal with Codeine Panwarfarin   Buf-Tabs Flurbiprofen Penicillamine   Butalbital Compound Four-way cold tablets Penicillin   Butazolidin Fragmin Pepto-Bismol   Carbenicillin Geminisyn Percodan   Carna Arthritis Reliever Geopen Persantine   Carprofen Gold's salt Persistin   Chloramphenicol Goody's Phenylbutazone   Chloromycetin Haltrain Piroxlcam   Clmetidine heparin Plaquenil   Cllnoril Hyco-pap Ponstel   Clofibrate Hydroxy chloroquine Propoxyphen         Before stopping any of these medications, be sure to consult the physician who ordered them.  Some, such as Coumadin (Warfarin) are ordered to prevent or treat serious conditions such as "deep thrombosis", "pumonary embolisms", and other heart  problems.  The amount of time that you may need off of the medication may also vary with the medication and the reason for which you were taking it.  If you are taking any of these medications, please make sure you notify your pain physician before you undergo any procedures.         Epidural Steroid Injection Patient Information  Description: The epidural space surrounds the nerves as they exit the spinal cord.  In some patients, the nerves can be compressed and inflamed by a bulging disc or a tight spinal canal (spinal stenosis).  By injecting steroids into the epidural space, we can bring irritated nerves into direct contact with a potentially helpful medication.  These steroids act directly on the irritated nerves and can reduce swelling and inflammation which often leads to decreased pain.  Epidural steroids may be injected anywhere along the spine and from the neck to the low back depending upon the location of your pain.   After numbing the skin with local anesthetic (like Novocaine), a small needle is passed into the epidural space slowly.  You may experience a sensation of pressure while this is being done.  The entire block usually last less than 10 minutes.  Conditions which may be treated by epidural steroids:   Low back and leg pain  Neck and arm pain  Spinal stenosis  Post-laminectomy syndrome  Herpes zoster (shingles) pain  Pain from compression fractures  Preparation for the injection:  1. Do not eat any solid food or dairy products within 8 hours of your appointment.  2. You may drink clear liquids up to 3 hours before appointment.  Clear liquids include water, black coffee, juice or soda.  No milk or cream please. 3. You may take your regular medication, including pain medications, with a sip of water before your appointment  Diabetics should hold regular insulin (if taken separately) and take 1/2 normal NPH dos the morning of the procedure.  Carry some sugar  containing items with you to your appointment. 4. A driver must accompany you and be prepared to drive you home after your procedure.  5. Bring all your current medications with your. 6. An IV may be inserted and sedation may be given at the discretion of the physician.  7. A blood pressure cuff, EKG and other monitors will often be applied during the procedure.  Some patients may need to have extra oxygen administered for a short period. 8. You will be asked to provide medical information, including your allergies, prior to the procedure.  We must know immediately if you are taking blood thinners (like Coumadin/Warfarin)  Or if you are allergic to IV iodine contrast (dye). We must know if you could possible be pregnant.  Possible side-effects:  Bleeding from needle site  Infection (rare, may require surgery)  Nerve injury (rare)  Numbness & tingling (temporary)  Difficulty urinating (rare, temporary)  Spinal headache ( a headache worse with upright posture)  Light -headedness (temporary)  Pain at injection site (several days)  Decreased blood pressure (temporary)  Weakness in arm/leg (temporary)  Pressure sensation in back/neck (temporary)  Call if you experience:  Fever/chills associated with headache or increased back/neck pain.  Headache worsened by an upright position.  New onset weakness or numbness of an extremity below the injection site  Hives or difficulty breathing (go to the emergency room)  Inflammation or drainage at the infection site  Severe back/neck pain  Any new symptoms which are concerning to you  Please note:  Although the local anesthetic injected can often make your back or neck feel good for several hours after the injection, the pain will likely return.  It takes 3-7 days for steroids to work in the epidural space.  You may not notice any pain relief for at least that one week.  If effective, we will often do a series of three injections  spaced 3-6 weeks apart to maximally decrease your pain.  After the initial series, we generally will wait several months before considering a repeat injection of the same type.  If you have any questions, please call [phone] [facility]  Gabapentin Titration  Medication used: Gabapentin (Generic Name) or Neurontin (Brand Name) 300 mg tablets/capsules  Reasons to stop increasing the dose:  Reason 1: You get good relief of symptoms, in which case there is no need to increase the daily dose any further.    Reason 2: You develop some side effects, such as sleeping all of the time, difficulty concentrating, or becoming disoriented, in which case you need to go down on the dose, to the prior level, where you were not experiencing any side effects. Stay on that dose longer, to allow more time for your body to get use it, before attempting to increase it again.   Reasons to stop increasing the dose: Reason 1: You get good relief of symptoms, in which case there is no need to increase the daily dose any further.  Reason 2: You develop some side effects, such as sleeping all of the time, difficulty concentrating, or becoming disoriented, in which case you need to go down on the dose, to the prior level, where you were not experiencing any side effects. Stay on that dose longer, to allow more time for your body to get use it, before attempting to increase it again.  Steps to increase medication: Step 1: Start by taking 1 (one) tablet at bedtime x 7 (seven) days.  Step 2: After 7 (seven) days of taking 1 (one) tablet at bedtime, increase it to 2 (two) tablets at bedtime. Stay on this dose x 7 (seven) days.  Step 3: After 7 (seven) days of taking 2 (two) tablets at bedtime, increase it to 3 (three) tablets  at bedtime. Stay on this dose x another 7 (seven) days.  Step 4: After 7 (seven) days of taking 3 (three) tablet at bedtime, begin taking 1 (one) tablet at  noon with lunch. Stay on this dose x another 7 (seven) days.  Step 5: After 7 (seven) days of taking 3 (three) tablet at bedtime, and 1 (one) tablet at noon, then begin taking 1 (one) tablet in the afternoon with dinner. Stay on this dose x another 7 (seven) days.  Step 6: After 7 (seven) days of taking 3 (three) tablet at bedtime, 1 (one) tablet at noon, and 1 (one) tablet in the afternoon, then begin taking 1 (one) tablet in the morning with breakfast. Stay on this dose x another 7 (seven) days. At this point you should be taking the medicine 4 (four) times a day, or about every 6 (six) hours. This daily regimen of taking the medicine 4 (four) times a day, will be maintained from now on. You should not take any doses any sooner than every 6 (six) hours.  Step 7: After 7 (seven) days of taking 3 (three) tablet at bedtime, 1 (one) tablet at noon, 1 (one) tablet in the afternoon, and 1 (one) tablet in the morning, begin taking 2 (two) tablets at noon with lunch. Stay on this dose x another 7 (seven) days.   Step 8: After 7 (seven) days of taking 3 (three) tablet at bedtime, 2 (two) tablets at noon, 1 (one) tablet in the afternoon, and 1 (one) tablet in the morning, begin taking 2 (two) tablets in the afternoon with dinner. Stay on this dose x another 7 (seven) days.   Step 9: After 7 (seven) days of taking 3 (three) tablet at bedtime, 2 (two) tablets at noon, 2 (two) tablets in the afternoon, and 1 (one) tablet in the morning, begin taking 2 (two) tablets in the morning with breakfast. Stay on this dose x another 7 (seven) days. At this point you should be taking the medicine 4 (four) times a day, or about every 6 (six) hours. This daily regimen of taking the medicine 4 (four) times a day, will be maintained from now on. You should not take any doses any sooner than every 6 (six) hours.  Step 10: After 7 (seven) days of taking 3 (three) tablet at bedtime, 2 (two) tablets at noon, 2 (two) tablets in the  afternoon, and 2 (two) tablets in the morning, begin taking 3 (three) tablets at noon with lunch. Stay on this dose x another 7 (seven) days.   Step 11: After 7 (seven) days of taking 3 (three) tablet at bedtime, 3 (three) tablets at noon, 2 (two) tablets in the afternoon, and 2 (two) tablets in the morning, begin taking 3 (three) tablets in the afternoon with dinner. Stay on this dose x another 7 (seven) days.   Step 12: After 7 (seven) days of taking 3 (three) tablet at bedtime, 3 (three) tablets at noon, 3 (three) tablets in the afternoon, and 2 (two) tablet in the morning, begin taking 3 (three) tablets in the morning with breakfast. Stay on this dose x another 7 (seven) days. At this point you should be taking the medicine 4 (four) times a day, or about every 6 (six) hours. This daily regimen of taking the medicine 4 (four) times a day, will be maintained from now on.   Endpoint: Once you have reached the maximum dose you can tolerate without side-effects, contact your physician so as to  evaluate the results of the regimen.   Questions: Feel free to contact us for any questions or problems at [phone]

## 2016-12-24 NOTE — Progress Notes (Signed)
Nursing Pain Medication Assessment:  Safety precautions to be maintained throughout the outpatient stay will include: orient to surroundings, keep bed in low position, maintain call bell within reach at all times, provide assistance with transfer out of bed and ambulation.  Medication Inspection Compliance: Stephanie [name] did not comply with our request to bring her pills to be counted. She was reminded that bringing the medication bottles, even when empty, is a requirement. Pill/Patch Count: None available to be counted. Bottle Appearance: No container available. Did not bring bottle(s) to appointment. Medication: None brought in. Filled Date: N/A Last Medication intake:  Ran out of medicine more than 48 hours ago

## 2017-01-20 ENCOUNTER — Telehealth: Payer: Self-pay

## 2017-01-20 NOTE — Telephone Encounter (Signed)
Patient advised no need to remove polish unless it is very dark or false nails.

## 2017-01-20 NOTE — Telephone Encounter (Signed)
Pt wants to know does she need to remove her finger nail polish before she has her procedure in april

## 2017-02-01 ENCOUNTER — Ambulatory Visit
Admission: RE | Admit: 2017-02-01 | Discharge: 2017-02-01 | Disposition: A | Discharge status: Home / Self Care | Payer: Medicare Other | Source: Ambulatory Visit | Attending: Pain Medicine | Admitting: Pain Medicine

## 2017-02-01 ENCOUNTER — Ambulatory Visit: Payer: Worker's Compensation | Attending: Pain Medicine | Admitting: Pain Medicine

## 2017-02-01 ENCOUNTER — Encounter: Payer: Self-pay | Admitting: Pain Medicine

## 2017-02-01 VITALS — BP 111/58 | HR 74 | Temp 97.6°F | Resp 16 | Ht 67.0 in | Wt 212.0 lb

## 2017-02-01 DIAGNOSIS — M79604 Pain in right leg: Secondary | ICD-10-CM | POA: Diagnosis not present

## 2017-02-01 DIAGNOSIS — Z79899 Other long term (current) drug therapy: Secondary | ICD-10-CM | POA: Insufficient documentation

## 2017-02-01 DIAGNOSIS — G9619 Other disorders of meninges, not elsewhere classified: Secondary | ICD-10-CM | POA: Diagnosis not present

## 2017-02-01 DIAGNOSIS — M961 Postlaminectomy syndrome, not elsewhere classified: Secondary | ICD-10-CM | POA: Insufficient documentation

## 2017-02-01 DIAGNOSIS — M79605 Pain in left leg: Secondary | ICD-10-CM | POA: Insufficient documentation

## 2017-02-01 DIAGNOSIS — Z9049 Acquired absence of other specified parts of digestive tract: Secondary | ICD-10-CM | POA: Insufficient documentation

## 2017-02-01 DIAGNOSIS — M5416 Radiculopathy, lumbar region: Secondary | ICD-10-CM | POA: Diagnosis not present

## 2017-02-01 DIAGNOSIS — M5441 Lumbago with sciatica, right side: Secondary | ICD-10-CM | POA: Insufficient documentation

## 2017-02-01 DIAGNOSIS — Z9889 Other specified postprocedural states: Secondary | ICD-10-CM | POA: Insufficient documentation

## 2017-02-01 DIAGNOSIS — G96198 Other disorders of meninges, not elsewhere classified: Secondary | ICD-10-CM

## 2017-02-01 DIAGNOSIS — G8929 Other chronic pain: Secondary | ICD-10-CM | POA: Diagnosis not present

## 2017-02-01 DIAGNOSIS — M5442 Lumbago with sciatica, left side: Secondary | ICD-10-CM

## 2017-02-01 MED ORDER — TRIAMCINOLONE ACETONIDE 40 MG/ML IJ SUSP
INTRAMUSCULAR | Status: AC
  Filled 2017-02-01: qty 1

## 2017-02-01 MED ORDER — FENTANYL CITRATE (PF) 100 MCG/2ML IJ SOLN
INTRAMUSCULAR | Status: AC
  Filled 2017-02-01: qty 2

## 2017-02-01 MED ORDER — ONDANSETRON HCL 4 MG/2ML IJ SOLN
INTRAMUSCULAR | Status: AC
  Filled 2017-02-01: qty 2

## 2017-02-01 MED ORDER — MIDAZOLAM HCL 5 MG/5ML IJ SOLN
INTRAMUSCULAR | Status: AC
  Filled 2017-02-01: qty 5

## 2017-02-01 MED ORDER — ROPIVACAINE HCL 5 MG/ML IJ SOLN
2.0000 mL | Freq: Once | INTRAMUSCULAR | Status: DC
  Filled 2017-02-01: qty 2

## 2017-02-01 MED ORDER — IOPAMIDOL (ISOVUE-M 200) INJECTION 41%
10.0000 mL | Freq: Once | INTRAMUSCULAR | Status: AC
  Administered 2017-02-01: 10 mL via EPIDURAL
  Filled 2017-02-01: qty 10

## 2017-02-01 MED ORDER — ONDANSETRON HCL 4 MG/2ML IJ SOLN
4.0000 mg | Freq: Once | INTRAMUSCULAR | Status: AC
  Administered 2017-02-01: 4 mg via INTRAVENOUS

## 2017-02-01 MED ORDER — LIDOCAINE HCL (PF) 1 % IJ SOLN
INTRAMUSCULAR | Status: AC
  Filled 2017-02-01: qty 5

## 2017-02-01 MED ORDER — TRIAMCINOLONE ACETONIDE 40 MG/ML IJ SUSP
40.0000 mg | Freq: Once | INTRAMUSCULAR | Status: AC
  Administered 2017-02-01: 40 mg

## 2017-02-01 MED ORDER — SODIUM CHLORIDE 0.9% FLUSH
2.0000 mL | Freq: Once | INTRAVENOUS | Status: AC
  Administered 2017-02-01: 2 mL

## 2017-02-01 MED ORDER — GLYCOPYRROLATE 0.2 MG/ML IJ SOLN
INTRAMUSCULAR | Status: AC
  Filled 2017-02-01: qty 1

## 2017-02-01 MED ORDER — ROPIVACAINE HCL 2 MG/ML IJ SOLN
2.0000 mL | Freq: Once | INTRAMUSCULAR | Status: AC
  Administered 2017-02-01: 2 mL via EPIDURAL

## 2017-02-01 MED ORDER — LACTATED RINGERS IV SOLN
1000.0000 mL | Freq: Once | INTRAVENOUS | Status: AC
  Administered 2017-02-01: 1000 mL via INTRAVENOUS

## 2017-02-01 MED ORDER — SODIUM CHLORIDE 0.9 % IJ SOLN
INTRAMUSCULAR | Status: AC
  Filled 2017-02-01: qty 10

## 2017-02-01 MED ORDER — MIDAZOLAM HCL 5 MG/5ML IJ SOLN
1.0000 mg | INTRAMUSCULAR | Status: DC | PRN
Start: 2017-02-01 — End: 2017-02-01
  Administered 2017-02-01: 2 mg via INTRAVENOUS

## 2017-02-01 MED ORDER — LIDOCAINE HCL (PF) 1 % IJ SOLN
10.0000 mL | Freq: Once | INTRAMUSCULAR | Status: AC
  Administered 2017-02-01: 5 mL

## 2017-02-01 MED ORDER — FENTANYL CITRATE (PF) 100 MCG/2ML IJ SOLN
25.0000 ug | INTRAMUSCULAR | Status: DC | PRN
  Administered 2017-02-01: 50 ug via INTRAVENOUS

## 2017-02-01 MED ORDER — ROPIVACAINE HCL 2 MG/ML IJ SOLN
INTRAMUSCULAR | Status: AC
  Filled 2017-02-01: qty 10

## 2017-02-01 NOTE — Progress Notes (Signed)
Safety precautions to be maintained throughout the outpatient stay will include: orient to surroundings, keep bed in low position, maintain call bell within reach at all times, provide assistance with transfer out of bed and ambulation.  

## 2017-02-01 NOTE — Patient Instructions (Addendum)
Post-Procedure instructions Instructions:  Apply ice: Fill a plastic sandwich bag with crushed ice. Cover it with a small towel and apply to injection site. Apply for 15 minutes then remove x 15 minutes. Repeat sequence on day of procedure, until you go to bed. The purpose is to minimize swelling and discomfort after procedure.  Apply heat: Apply heat to procedure site starting the day following the procedure. The purpose is to treat any soreness and discomfort from the procedure.  Food intake: Start with clear liquids (like water) and advance to regular food, as tolerated.   Physical activities: Keep activities to a minimum for the first 8 hours after the procedure.   Driving: If you have received any sedation, you are not allowed to drive for 24 hours after your procedure.  Blood thinner: Restart your blood thinner 6 hours after your procedure. (Only for those taking blood thinners)  Insulin: As soon as you can eat, you may resume your normal dosing schedule. (Only for those taking insulin)  Infection prevention: Keep procedure site clean and dry.  Post-procedure Pain Diary: Extremely important that this be done correctly and accurately. Recorded information will be used to determine the next step in treatment.  Pain evaluated is that of treated area only. Do not include pain from an untreated area.  Complete every hour, on the hour, for the initial 8 hours. Set an alarm to help you do this part accurately.  Do not go to sleep and have it completed later. It will not be accurate.  Follow-up appointment: Keep your follow-up appointment after the procedure. Usually 2 weeks for most procedures. (6 weeks in the case of radiofrequency.) Bring you pain diary.  Expect:  From numbing medicine (AKA: Local Anesthetics): Numbness or decrease in pain.  Onset: Full effect within 15 minutes of injected.  Duration: It will depend on the type of local anesthetic used. On the average, 1 to 8  hours.   From steroids: Decrease in swelling or inflammation. Once inflammation is improved, relief of the pain will follow.  Onset of benefits: Depends on the amount of swelling present. The more swelling, the longer it will take for the benefits to be seen.   Duration: Steroids will stay in the system x 2 weeks. Duration of benefits will depend on multiple posibilities including persistent irritating factors.  From procedure: Some discomfort is to be expected once the numbing medicine wears off. This should be minimal if ice and heat are applied as instructed. Call if:  You experience numbness and weakness that gets worse with time, as opposed to wearing off.  New onset bowel or bladder incontinence. (Spinal procedures only)  Emergency Numbers:  Durning business hours (Monday - Thursday, 8:00 AM - 4:00 PM) (Friday, 9:00 AM - 12:00 Noon): [phone]  After hours: [phone]   __________________________________________________________________________________________   Pain Management Discharge Instructions  General Discharge Instructions :  If you need to reach your doctor call: Monday-Friday 8:00 am - 4:00 pm at [phone] or toll free [phone].  After clinic hours [phone] to have operator reach doctor.  Bring all of your medication bottles to all your appointments in the pain clinic.  To cancel or reschedule your appointment with Pain Management please remember to call 24 hours in advance to avoid a fee.  Refer to the educational materials which you have been given on: General Risks, I had my Procedure. Discharge Instructions, Post Sedation.  Post Procedure Instructions:  The drugs you were given will stay in   your system until tomorrow, so for the next 24 hours you should not drive, make any legal decisions or drink any alcoholic beverages.  You may eat anything you prefer, but it is better to start with liquids then soups and crackers, and gradually  work up to solid foods.  Please notify your doctor immediately if you have any unusual bleeding, trouble breathing or pain that is not related to your normal pain.  Depending on the type of procedure that was done, some parts of your body may feel week and/or numb.  This usually clears up by tonight or the next day.  Walk with the use of an assistive device or accompanied by an adult for the 24 hours.  You may use ice on the affected area for the first 24 hours.  Put ice in a Ziploc bag and cover with a towel and place against area 15 minutes on 15 minutes off.  You may switch to heat after 24 hours. Epidural Steroid Injection An epidural steroid injection is a shot of steroid medicine and numbing medicine that is given into the space between the spinal cord and the bones in your back (epidural space). The shot helps relieve pain caused by an irritated or swollen nerve root. The amount of pain relief you get from the injection depends on what is causing the nerve to be swollen and irritated, and how long your pain lasts. You are more likely to benefit from this injection if your pain is strong and comes on suddenly rather than if you have had pain for a long time. Tell a health care provider about:  Any allergies you have.  All medicines you are taking, including vitamins, herbs, eye drops, creams, and over-the-counter medicines.  Any problems you or family members have had with anesthetic medicines.  Any blood disorders you have.  Any surgeries you have had.  Any medical conditions you have.  Whether you are pregnant or may be pregnant. What are the risks? Generally, this is a safe procedure. However, problems may occur, including:  Headache.  Bleeding.  Infection.  Allergic reaction to medicines.  Damage to your nerves. What happens before the procedure? Staying hydrated  Follow instructions from your health care provider about hydration, which may include:  Up to 2 hours  before the procedure - you may continue to drink clear liquids, such as water, clear fruit juice, black coffee, and plain tea. Eating and drinking restrictions  Follow instructions from your health care provider about eating and drinking, which may include:  8 hours before the procedure - stop eating heavy meals or foods such as meat, fried foods, or fatty foods.  6 hours before the procedure - stop eating light meals or foods, such as toast or cereal.  6 hours before the procedure - stop drinking milk or drinks that contain milk.  2 hours before the procedure - stop drinking clear liquids. Medicine   You may be given medicines to lower anxiety.  Ask your health care provider about:  Changing or stopping your regular medicines. This is especially important if you are taking diabetes medicines or blood thinners.  Taking medicines such as aspirin and ibuprofen. These medicines can thin your blood. Do not take these medicines before your procedure if your health care provider instructs you not to. General instructions   Plan to have someone take you home from the hospital or clinic. What happens during the procedure?  You may receive a medicine to help you relax (  sedative).  You will be asked to lie on your abdomen.  The injection site will be cleaned.  A numbing medicine (local anesthetic) will be used to numb the injection site.  A needle will be inserted through your skin into the epidural space. You may feel some discomfort when this happens. An X-ray machine will be used to make sure the needle is put as close as possible to the affected nerve.  A steroid medicine and a local anesthetic will be injected into the epidural space.  The needle will be removed.  A bandage (dressing) will be put over the injection site. What happens after the procedure?  Your blood pressure, heart rate, breathing rate, and blood oxygen level will be monitored until the medicines you were given  have worn off.  Your arm or leg may feel weak or numb for a few hours.  The injection site may feel sore.  Do not drive for 24 hours if you received a sedative. This information is not intended to replace advice given to you by your health care provider. Make sure you discuss any questions you have with your health care provider. Document Released: 01/26/2008 Document Revised: 04/01/2016 Document Reviewed: 02/04/2016 [employer] Interactive Patient Education  2017 [employer].

## 2017-02-01 NOTE — Progress Notes (Addendum)
Patient's Name: Stephanie [name]  MRN: [id number]  Referring Provider: Clarisse Gouge, MD  DOB: 04/02/1961  PCP: [facility]  DOS: 02/01/2017  Note by: Kathlen Brunswick. Dossie Arbour, MD  Service setting: Ambulatory outpatient  Location: [facility] (AMB) Pain Management Facility  Visit type: Procedure  Specialty: Interventional Pain Management  Patient type: Established   Primary Reason for Visit: Interventional Pain Management Treatment. CC: Back Pain (lower)  Procedure:  Anesthesia, Analgesia, Anxiolysis:  Type: Diagnostic Epidural Steroid Injection + Diagnostic Epidurogram Region: Caudal Level: Sacrococcygeal   Laterality: Midline       Type: Local Anesthesia with Moderate (Conscious) Sedation Local Anesthetic: Lidocaine 1% Route: Intravenous (IV) IV Access: Secured Sedation: Meaningful verbal contact was maintained at all times during the procedure  Indication(s): Analgesia and Anxiety  Indications: 1. Chronic low back pain (Location of Primary Source of Pain) (Bilateral) (L>R)   2. Chronic lower extremity pain (Location of Secondary source of pain) (Bilateral) (L>R)   3. Chronic lumbar radicular pain (Right) (S1)   4. Epidural fibrosis   5. 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 Evlyn Clines, [facility])    Pain Score: Pre-procedure: 3 /10 Post-procedure: 0-No pain/10  Pre-op Assessment:  Previous date of service: 12/24/16 Service provided: Med Refill [name] is a 56 y.o. (year old), female patient, seen today for interventional treatment. She  has a past surgical history that includes Back surgery; Appendectomy; Cholecystectomy; Cesarean section; and Bunionectomy. Her primarily concern today is the Back Pain (lower)  Initial Vital Signs: Blood pressure 113/63, pulse 74, temperature 97.9 F (36.6 C), temperature source Other (Comment), resp. rate 16, height 5\' 7"  (1.702 m), weight 212 lb (96.2 kg), SpO2 97 %. BMI: 33.20 kg/m  Risk  Assessment: Allergies: Reviewed. She is allergic to phenergan [promethazine hcl].  Allergy Precautions: None required Coagulopathies: "Reviewed. None identified.  Blood-thinner therapy: None at this time Active Infection(s): Reviewed. None identified. [name] is afebrile  Site Confirmation: [name] was asked to confirm the procedure and laterality before marking the site Procedure checklist: Completed Consent: Before the procedure and under the influence of no sedative(s), amnesic(s), or anxiolytics, the patient was informed of the treatment options, risks and possible complications. To fulfill our ethical and legal obligations, as recommended by the American Medical Association's Code of Ethics, I have informed the patient of my clinical impression; the nature and purpose of the treatment or procedure; the risks, benefits, and possible complications of the intervention; the alternatives, including doing nothing; the risk(s) and benefit(s) of the alternative treatment(s) or procedure(s); and the risk(s) and benefit(s) of doing nothing. The patient was provided information about the general risks and possible complications associated with the procedure. These may include, but are not limited to: failure to achieve desired goals, infection, bleeding, organ or nerve damage, allergic reactions, paralysis, and death. In addition, the patient was informed of those risks and complications associated to Spine-related procedures, such as failure to decrease pain; infection (i.e.: Meningitis, epidural or intraspinal abscess); bleeding (i.e.: epidural hematoma, subarachnoid hemorrhage, or any other type of intraspinal or peri-dural bleeding); organ or nerve damage (i.e.: Any type of peripheral nerve, nerve root, or spinal cord injury) with subsequent damage to sensory, motor, and/or autonomic systems, resulting in permanent pain, numbness, and/or weakness of one or several areas of the body; allergic  reactions; (i.e.: anaphylactic reaction); and/or death. Furthermore, the patient was informed of those risks and complications associated with the medications. These include, but are  not limited to: allergic reactions (i.e.: anaphylactic or anaphylactoid reaction(s)); adrenal axis suppression; blood sugar elevation that in diabetics may result in ketoacidosis or comma; water retention that in patients with history of congestive heart failure may result in shortness of breath, pulmonary edema, and decompensation with resultant heart failure; weight gain; swelling or edema; medication-induced neural toxicity; particulate matter embolism and blood vessel occlusion with resultant organ, and/or nervous system infarction; and/or aseptic necrosis of one or more joints. Finally, the patient was informed that Medicine is not an exact science; therefore, there is also the possibility of unforeseen or unpredictable risks and/or possible complications that may result in a catastrophic outcome. The patient indicated having understood very clearly. We have given the patient no guarantees and we have made no promises. Enough time was given to the patient to ask questions, all of which were answered to the patient's satisfaction. [name] has indicated that she wanted to continue with the procedure. Attestation: I, the ordering provider, attest that I have discussed with the patient the benefits, risks, side-effects, alternatives, likelihood of achieving goals, and potential problems during recovery for the procedure that I have provided informed consent. Date: 02/01/2017; Time: 8:27 AM  Pre-Procedure Preparation:  Monitoring: As per clinic protocol. Respiration, ETCO2, SpO2, BP, heart rate and rhythm monitor placed and checked for adequate function Safety Precautions: Patient was assessed for positional comfort and pressure points before starting the procedure. Time-out: I initiated and conducted the "Time-out" before  starting the procedure, as per protocol. The patient was asked to participate by confirming the accuracy of the "Time Out" information. Verification of the correct person, site, and procedure were performed and confirmed by me, the nursing staff, and the patient. "Time-out" conducted as per Joint Commission's Universal Protocol (UP.01.01.01). "Time-out" Date & Time: 02/01/2017; 0909 hrs.  Description of Procedure Process:   Position: Prone Target Area: Caudal Epidural Canal. Approach: Midline approach. Area Prepped: Entire Posterior Sacrococcygeal Region Prepping solution: ChloraPrep (2% chlorhexidine gluconate and 70% isopropyl alcohol) Safety Precautions: Aspiration looking for blood return was conducted prior to all injections. At no point did we inject any substances, as a needle was being advanced. No attempts were made at seeking any paresthesias. Safe injection practices and needle disposal techniques used. Medications properly checked for expiration dates. SDV (single dose vial) medications used. Description of the Procedure: Protocol guidelines were followed. The patient was placed in position over the fluoroscopy table. The target area was identified and the area prepped in the usual manner. Skin desensitized using vapocoolant spray. Skin & deeper tissues infiltrated with local anesthetic. Appropriate amount of time allowed to pass for local anesthetics to take effect. The procedure needles were then advanced to the target area. Proper needle placement secured. Negative aspiration confirmed. Solution injected in intermittent fashion, asking for systemic symptoms every 0.5cc of injectate. The needles were then removed and the area cleansed, making sure to leave some of the prepping solution back to take advantage of its long term bactericidal properties. Vitals:   02/01/17 0923 02/01/17 0933 02/01/17 0943 02/01/17 0953  BP: 118/69 (!) 111/58 (!) 110/56 (!) 111/58  Pulse:      Resp: (!) 22 16 16  16   Temp:  97.6 F (36.4 C)    TempSrc:      SpO2: 96% 97% 96% 97%  Weight:      Height:        Start Time: 0909 hrs. End Time: 0917 hrs. Materials:  Needle(s) Type: Epidural needle Gauge: 17G Length:  3.5-in Medication(s): We administered fentaNYL, lactated ringers, midazolam, triamcinolone acetonide, iopamidol, lidocaine (PF), sodium chloride flush, ropivacaine (PF) 2 mg/mL (0.2%), and ondansetron. Please see chart orders for dosing details.  Imaging Guidance (Spinal):  Type of Imaging Technique: Fluoroscopy Guidance (Spinal) Indication(s): Assistance in needle guidance and placement for procedures requiring needle placement in or near specific anatomical locations not easily accessible without such assistance. Exposure Time: Please see nurses notes. Contrast: Before injecting any contrast, we confirmed that the patient did not have an allergy to iodine, shellfish, or radiological contrast. Once satisfactory needle placement was completed at the desired level, radiological contrast was injected. Contrast injected under live fluoroscopy. No contrast complications. See chart for type and volume of contrast used. Fluoroscopic Guidance: I was personally present during the use of fluoroscopy. "Tunnel Vision Technique" used to obtain the best possible view of the target area. Parallax error corrected before commencing the procedure. "Direction-depth-direction" technique used to introduce the needle under continuous pulsed fluoroscopy. Once target was reached, antero-posterior, oblique, and lateral fluoroscopic projection used confirm needle placement in all planes. Images permanently stored in EMR. Interpretation: I personally interpreted the imaging intraoperatively. Adequate needle placement confirmed in multiple planes. Appropriate spread of contrast into desired area was observed. No evidence of afferent or efferent intravascular uptake. No intrathecal or subarachnoid spread observed. Permanent  images saved into the patient's record.  Diagnostic Epidurogram:  Contrast: Before injecting any contrast, we confirmed that the patient did not have an allergy to iodine, shellfish, or radiological contrast. For accuracy purposes, contrast was injected under live fluoroscopy. Study personally interpreted intraoparatively. Type: Non-ionic, water soluble, hypoallergenic, myelogram-compatible, radiological contrast used. Please see orders and nurses note for specific choice of contrast. Volume: Please see nurses note for injected volume.  Observations:  Spinal Alignment: Adequate       Vertebral body: Intact Lamina: Surgical changes observed Disc: Loss of disc hight Facet: Arthritic changes  Mild Hardware: Pedicle screw(s) observed bilaterally at the L4 and L5 levels.  Spread: Abnormal contrast spread. See below. Anterior: No flow of contrast Posterior: Defect identified at the level of the upper endplate of S1, bilaterally. Superior (cephalad): No flow of contrast above the upper endplate of S1 Inferior (caudad): Flow limited to the S1 nerve root, bilaterally with some evidence of flow into the S2, bilaterally. The S3 nerve root is observed only on the left side. Right lateral: Adequate with a caudad of the upper endplate of S1 Left lateral: Adequate with a clot off at the upper endplate of S1 Plica medialis dorsalis: Medially aligned Nerve root sleeve: Flow and visualization restricted to the S1 bilaterally, S2 bilaterally, and S3 on the left side. No contrast observed above the endplate of S1. No L5 nerve root observed. Epidural extravasation: None observed Intrathecal: No intrathecal spread identified Subarachnoid: No subarachnoid spread pattern observed Vascular: No evidence of afferent or efferent intravascular uptake  Impression: Technically successful epidurogram. Observed changes are compatible with epidural fibrosis, as described above. There seems to be epidural fibrosis  starting at the lower and of the surgical intervention at about the level of the upper S1 endplate, bilaterally. No L5 nerve root observed. Note: Hard copies saved to EMR.  Antibiotic Prophylaxis:  Indication(s): None identified Antibiotic given: None  Post-operative Assessment:  EBL: None Complications: No immediate post-treatment complications observed by team, or reported by patient. Note: The patient tolerated the entire procedure well. A repeat set of vitals were taken after the procedure and the patient was kept under observation following institutional  policy, for this type of procedure. Post-procedural neurological assessment was performed, showing return to baseline, prior to discharge. The patient was provided with post-procedure discharge instructions, including a section on how to identify potential problems. Should any problems arise concerning this procedure, the patient was given instructions to immediately contact us, at any time, without hesitation. In any case, we plan to contact the patient by telephone for a follow-up status report regarding this interventional procedure. Comments:  No additional relevant information.  Plan of Care  Disposition: Discharge home  Discharge Date & Time: 02/01/2017; 0956 hrs.  Physician-requested Follow-up:  Return in about 2 weeks (around 02/15/2017) for Post-Procedure evaluation.  Future Appointments Date Time Provider [place]  03/01/2017 1:45 PM Milinda Pointer, MD [facility] None  03/23/2017 7:45 AM Milinda Pointer, MD [facility] None   Medications ordered for procedure: Meds ordered this encounter  Medications  . fentaNYL (SUBLIMAZE) injection 25-50 mcg    Make sure Narcan is available in the pyxis when using this medication. In the event of respiratory depression (RR< 8/min): Titrate NARCAN (naloxone) in increments of 0.1 to 0.2 mg IV at 2-3 minute intervals, until desired degree of reversal.  . lactated ringers infusion  1,000 mL  . midazolam (VERSED) 5 MG/5ML injection 1-2 mg    Make sure Flumazenil is available in the pyxis when using this medication. If oversedation occurs, administer 0.2 mg IV over 15 sec. If after 45 sec no response, administer 0.2 mg again over 1 min; may repeat at 1 min intervals; not to exceed 4 doses (1 mg)  . triamcinolone acetonide (KENALOG-40) injection 40 mg  . iopamidol (ISOVUE-M) 41 % intrathecal injection 10 mL  . lidocaine (PF) (XYLOCAINE) 1 % injection 10 mL  . sodium chloride flush (NS) 0.9 % injection 2 mL  . DISCONTD: ropivacaine (PF) 5 mg/mL (0.5%) (NAROPIN) injection 2 mL    Preservative-free (MPF), single use vial.  . ropivacaine (PF) 2 mg/mL (0.2%) (NAROPIN) injection 2 mL  . ondansetron (ZOFRAN) injection 4 mg   Medications administered: We administered fentaNYL, lactated ringers, midazolam, triamcinolone acetonide, iopamidol, lidocaine (PF), sodium chloride flush, ropivacaine (PF) 2 mg/mL (0.2%), and ondansetron.  See the medical record for exact dosing, route, and time of administration.  Lab-work, Procedure(s), & Referral(s) Ordered: Orders Placed This Encounter  Procedures  . Caudal Epidural Injection  . DG C-Arm 1-60 Min-No Report  . Discharge instructions  . Follow-up  . Informed Consent Details: Transcribe to consent form and obtain patient signature  . Provider attestation of informed consent for procedure/surgical case  . Verify informed consent   Imaging Ordered: No results found for this or any previous visit. New Prescriptions   No medications on file   Primary Care Physician: [place] Location: [facility] Note by: Qunisha Bryk A. Dossie Arbour, M.D, DABA, DABAPM, DABPM, DABIPP, FIPP Date: 02/01/2017; Time: 11:04 AM  Disclaimer:  Medicine is not an Chief Strategy Officer. The only guarantee in medicine is that nothing is guaranteed. It is important to note that the decision to proceed with this intervention was based  on the information collected from the patient. The Data and conclusions were drawn from the patient's questionnaire, the interview, and the physical examination. Because the information was provided in large part by the patient, it cannot be guaranteed that it has not been purposely or unconsciously manipulated. Every effort has been made to obtain as much relevant data as possible for this evaluation. It is important to note that the conclusions  that lead to this procedure are derived in large part from the available data. Always take into account that the treatment will also be dependent on availability of resources and existing treatment guidelines, considered by other Pain Management Practitioners as being common knowledge and practice, at the time of the intervention. For Medico-Legal purposes, it is also important to point out that variation in procedural techniques and pharmacological choices are the acceptable norm. The indications, contraindications, technique, and results of the above procedure should only be interpreted and judged by a Board-Certified Interventional Pain Specialist with extensive familiarity and expertise in the same exact procedure and technique. Attempts at providing opinions without similar or greater experience and expertise than that of the treating physician will be considered as inappropriate and unethical, and shall result in a formal complaint to the state medical board and applicable specialty societies.  Instructions provided at this appointment: Patient Instructions  Post-Procedure instructions Instructions:  Apply ice: Fill a plastic sandwich bag with crushed ice. Cover it with a small towel and apply to injection site. Apply for 15 minutes then remove x 15 minutes. Repeat sequence on day of procedure, until you go to bed. The purpose is to minimize swelling and discomfort after procedure.  Apply heat: Apply heat to procedure site starting the day following the  procedure. The purpose is to treat any soreness and discomfort from the procedure.  Food intake: Start with clear liquids (like water) and advance to regular food, as tolerated.   Physical activities: Keep activities to a minimum for the first 8 hours after the procedure.   Driving: If you have received any sedation, you are not allowed to drive for 24 hours after your procedure.  Blood thinner: Restart your blood thinner 6 hours after your procedure. (Only for those taking blood thinners)  Insulin: As soon as you can eat, you may resume your normal dosing schedule. (Only for those taking insulin)  Infection prevention: Keep procedure site clean and dry.  Post-procedure Pain Diary: Extremely important that this be done correctly and accurately. Recorded information will be used to determine the next step in treatment.  Pain evaluated is that of treated area only. Do not include pain from an untreated area.  Complete every hour, on the hour, for the initial 8 hours. Set an alarm to help you do this part accurately.  Do not go to sleep and have it completed later. It will not be accurate.  Follow-up appointment: Keep your follow-up appointment after the procedure. Usually 2 weeks for most procedures. (6 weeks in the case of radiofrequency.) Bring you pain diary.  Expect:  From numbing medicine (AKA: Local Anesthetics): Numbness or decrease in pain.  Onset: Full effect within 15 minutes of injected.  Duration: It will depend on the type of local anesthetic used. On the average, 1 to 8 hours.   From steroids: Decrease in swelling or inflammation. Once inflammation is improved, relief of the pain will follow.  Onset of benefits: Depends on the amount of swelling present. The more swelling, the longer it will take for the benefits to be seen.   Duration: Steroids will stay in the system x 2 weeks. Duration of benefits will depend on multiple posibilities including persistent irritating  factors.  From procedure: Some discomfort is to be expected once the numbing medicine wears off. This should be minimal if ice and heat are applied as instructed. Call if:  You experience numbness and weakness that gets worse with time, as opposed to  wearing off.  New onset bowel or bladder incontinence. (Spinal procedures only)  Emergency Numbers:  Durning business hours (Monday - Thursday, 8:00 AM - 4:00 PM) (Friday, 9:00 AM - 12:00 Noon): [phone]  After hours: [phone]   __________________________________________________________________________________________   Pain Management Discharge Instructions  General Discharge Instructions :  If you need to reach your doctor call: Monday-Friday 8:00 am - 4:00 pm at [phone] or toll free [phone].  After clinic hours [phone] to have operator reach doctor.  Bring all of your medication bottles to all your appointments in the pain clinic.  To cancel or reschedule your appointment with Pain Management please remember to call 24 hours in advance to avoid a fee.  Refer to the educational materials which you have been given on: General Risks, I had my Procedure. Discharge Instructions, Post Sedation.  Post Procedure Instructions:  The drugs you were given will stay in your system until tomorrow, so for the next 24 hours you should not drive, make any legal decisions or drink any alcoholic beverages.  You may eat anything you prefer, but it is better to start with liquids then soups and crackers, and gradually work up to solid foods.  Please notify your doctor immediately if you have any unusual bleeding, trouble breathing or pain that is not related to your normal pain.  Depending on the type of procedure that was done, some parts of your body may feel week and/or numb.  This usually clears up by tonight or the next day.  Walk with the use of an assistive device or accompanied by an adult for the 24  hours.  You may use ice on the affected area for the first 24 hours.  Put ice in a Ziploc bag and cover with a towel and place against area 15 minutes on 15 minutes off.  You may switch to heat after 24 hours. Epidural Steroid Injection An epidural steroid injection is a shot of steroid medicine and numbing medicine that is given into the space between the spinal cord and the bones in your back (epidural space). The shot helps relieve pain caused by an irritated or swollen nerve root. The amount of pain relief you get from the injection depends on what is causing the nerve to be swollen and irritated, and how long your pain lasts. You are more likely to benefit from this injection if your pain is strong and comes on suddenly rather than if you have had pain for a long time. Tell a health care provider about:  Any allergies you have.  All medicines you are taking, including vitamins, herbs, eye drops, creams, and over-the-counter medicines.  Any problems you or family members have had with anesthetic medicines.  Any blood disorders you have.  Any surgeries you have had.  Any medical conditions you have.  Whether you are pregnant or may be pregnant. What are the risks? Generally, this is a safe procedure. However, problems may occur, including:  Headache.  Bleeding.  Infection.  Allergic reaction to medicines.  Damage to your nerves. What happens before the procedure? Staying hydrated  Follow instructions from your health care provider about hydration, which may include:  Up to 2 hours before the procedure - you may continue to drink clear liquids, such as water, clear fruit juice, black coffee, and plain tea. Eating and drinking restrictions  Follow instructions from your health care provider about eating and drinking, which may include:  8 hours before the procedure - stop eating  heavy meals or foods such as meat, fried foods, or fatty foods.  6 hours before the procedure -  stop eating light meals or foods, such as toast or cereal.  6 hours before the procedure - stop drinking milk or drinks that contain milk.  2 hours before the procedure - stop drinking clear liquids. Medicine   You may be given medicines to lower anxiety.  Ask your health care provider about:  Changing or stopping your regular medicines. This is especially important if you are taking diabetes medicines or blood thinners.  Taking medicines such as aspirin and ibuprofen. These medicines can thin your blood. Do not take these medicines before your procedure if your health care provider instructs you not to. General instructions   Plan to have someone take you home from the hospital or clinic. What happens during the procedure?  You may receive a medicine to help you relax (sedative).  You will be asked to lie on your abdomen.  The injection site will be cleaned.  A numbing medicine (local anesthetic) will be used to numb the injection site.  A needle will be inserted through your skin into the epidural space. You may feel some discomfort when this happens. An X-ray machine will be used to make sure the needle is put as close as possible to the affected nerve.  A steroid medicine and a local anesthetic will be injected into the epidural space.  The needle will be removed.  A bandage (dressing) will be put over the injection site. What happens after the procedure?  Your blood pressure, heart rate, breathing rate, and blood oxygen level will be monitored until the medicines you were given have worn off.  Your arm or leg may feel weak or numb for a few hours.  The injection site may feel sore.  Do not drive for 24 hours if you received a sedative. This information is not intended to replace advice given to you by your health care provider. Make sure you discuss any questions you have with your health care provider. Document Released: 01/26/2008 Document Revised: 04/01/2016  Document Reviewed: 02/04/2016 [employer]vier Interactive Patient Education  2017 [employer].

## 2017-02-02 ENCOUNTER — Telehealth: Payer: Self-pay | Admitting: *Deleted

## 2017-02-02 NOTE — Telephone Encounter (Signed)
Denies problems post procedure. 

## 2017-02-08 ENCOUNTER — Telehealth: Payer: Self-pay | Admitting: *Deleted

## 2017-03-01 ENCOUNTER — Ambulatory Visit: Payer: Self-pay | Admitting: Pain Medicine

## 2017-03-21 ENCOUNTER — Other Ambulatory Visit: Payer: Self-pay | Admitting: Pain Medicine

## 2017-03-21 DIAGNOSIS — M4316 Spondylolisthesis, lumbar region: Secondary | ICD-10-CM

## 2017-03-23 ENCOUNTER — Encounter: Payer: Self-pay | Admitting: Pain Medicine

## 2017-03-23 ENCOUNTER — Telehealth: Payer: Self-pay | Admitting: Pain Medicine

## 2017-03-23 ENCOUNTER — Ambulatory Visit: Payer: Worker's Compensation | Attending: Pain Medicine | Admitting: Pain Medicine

## 2017-03-23 VITALS — BP 107/81 | HR 68 | Temp 98.2°F | Resp 18 | Ht 67.0 in | Wt 217.0 lb

## 2017-03-23 DIAGNOSIS — M545 Low back pain: Secondary | ICD-10-CM | POA: Insufficient documentation

## 2017-03-23 DIAGNOSIS — M4316 Spondylolisthesis, lumbar region: Secondary | ICD-10-CM

## 2017-03-23 DIAGNOSIS — M79604 Pain in right leg: Secondary | ICD-10-CM | POA: Diagnosis not present

## 2017-03-23 DIAGNOSIS — D509 Iron deficiency anemia, unspecified: Secondary | ICD-10-CM | POA: Insufficient documentation

## 2017-03-23 DIAGNOSIS — G9619 Other disorders of meninges, not elsewhere classified: Secondary | ICD-10-CM

## 2017-03-23 DIAGNOSIS — G894 Chronic pain syndrome: Secondary | ICD-10-CM | POA: Insufficient documentation

## 2017-03-23 DIAGNOSIS — G96198 Other disorders of meninges, not elsewhere classified: Secondary | ICD-10-CM

## 2017-03-23 DIAGNOSIS — M62830 Muscle spasm of back: Secondary | ICD-10-CM

## 2017-03-23 DIAGNOSIS — M25561 Pain in right knee: Secondary | ICD-10-CM

## 2017-03-23 DIAGNOSIS — M25562 Pain in left knee: Secondary | ICD-10-CM

## 2017-03-23 DIAGNOSIS — F1190 Opioid use, unspecified, uncomplicated: Secondary | ICD-10-CM

## 2017-03-23 DIAGNOSIS — G4701 Insomnia due to medical condition: Secondary | ICD-10-CM

## 2017-03-23 DIAGNOSIS — M792 Neuralgia and neuritis, unspecified: Secondary | ICD-10-CM

## 2017-03-23 DIAGNOSIS — M5441 Lumbago with sciatica, right side: Secondary | ICD-10-CM

## 2017-03-23 DIAGNOSIS — G8929 Other chronic pain: Secondary | ICD-10-CM

## 2017-03-23 DIAGNOSIS — M791 Myalgia: Secondary | ICD-10-CM

## 2017-03-23 DIAGNOSIS — M5442 Lumbago with sciatica, left side: Secondary | ICD-10-CM | POA: Diagnosis not present

## 2017-03-23 DIAGNOSIS — M79605 Pain in left leg: Secondary | ICD-10-CM

## 2017-03-23 DIAGNOSIS — M961 Postlaminectomy syndrome, not elsewhere classified: Secondary | ICD-10-CM

## 2017-03-23 DIAGNOSIS — Z79891 Long term (current) use of opiate analgesic: Secondary | ICD-10-CM

## 2017-03-23 DIAGNOSIS — M7918 Myalgia, other site: Secondary | ICD-10-CM

## 2017-03-23 MED ORDER — TRAZODONE HCL 100 MG PO TABS: 50.0000 mg | ORAL_TABLET | Freq: Every evening | ORAL | 0 refills | Status: DC | PRN

## 2017-03-23 MED ORDER — CVS MELATONIN 10 MG PO CAPS: 10.0000 mg | ORAL_CAPSULE | Freq: Every day | ORAL | 2 refills | Status: DC

## 2017-03-23 MED ORDER — OXYCODONE HCL 5 MG PO TABS: 5.0000 mg | ORAL_TABLET | Freq: Two times a day (BID) | ORAL | 0 refills | Status: DC | PRN

## 2017-03-23 MED ORDER — CYCLOBENZAPRINE HCL 10 MG PO TABS: 10.0000 mg | ORAL_TABLET | Freq: Every day | ORAL | 0 refills | Status: DC

## 2017-03-23 MED ORDER — MELOXICAM 15 MG PO TABS: 15.0000 mg | ORAL_TABLET | Freq: Every morning | ORAL | 0 refills | Status: DC

## 2017-03-23 NOTE — Progress Notes (Signed)
Patient's Name: Stephanie [name]  MRN: [id number]  Referring Provider: Clarisse Gouge, MD  DOB: 12/12/1960  PCP: [facility]  DOS: 03/23/2017  Note by: Kathlen Brunswick. Dossie Arbour, MD  Service setting: Ambulatory outpatient  Specialty: Interventional Pain Management  Location: [facility] (AMB) Pain Management Facility    Patient type: Established   Primary Reason(s) for Visit: Encounter for prescription drug management & post-procedure evaluation of chronic illness with mild to moderate exacerbation(Level of risk: moderate) CC: Back Pain (low)  HPI  Stephanie [name] is a 56 y.o. year old, female patient, who comes today for a post-procedure evaluation and medication management. 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 Her primarily concern today is the Back Pain (low)  Pain Assessment: Self-Reported Pain Score: 1 /10             Reported level is compatible with observation.       Pain Type: Chronic pain Pain Location: Back Pain Orientation: Lower Pain Frequency: Constant  Stephanie [name] was last seen on 03/21/2017 for a procedure. During today's appointment we reviewed [name]'s post-procedure results, as well as her outpatient medication regimen. The patient indicates having obtained 80% ongoing relief of her pain from the diagnostic caudal epidural steroid injection + diagnostic epidurogram. The test showed that the patient does seem to have some epidural fibrosis and therefore she may be a good candidate for a Racz procedure. Today we have taken the opportunity to change the patient's medication from the hydrocodone/APAP to the oxycodone without Tylenol.  Further details on both, my assessment(s), as well as the proposed treatment plan, please see below.  Controlled Substance Pharmacotherapy Assessment REMS (Risk Evaluation and Mitigation Strategy)  Analgesic:Hydrocodone/APAP 10/325 one and a half tablets per day (15 mg/day of hydrocodone). Today the patient was switched to oxycodone IR 5 mg 1 tablet by mouth twice a day (10 mg/day of oxycodone) (15 MME/Day) MME/day:15 mg/day. Dewayne Shorter, RN  03/23/2017  8:13 AM  Signed Nursing Pain Medication Assessment:  Safety precautions to be maintained throughout the outpatient stay will include: orient to surroundings, keep bed in low position, maintain call bell within reach at all times, provide assistance with transfer out of bed and ambulation.  Medication Inspection Compliance: Pill count conducted under aseptic conditions, in front of the patient. Neither the pills nor the bottle was removed from the patient's sight at any time. Once count was completed pills were immediately  returned to the patient in  their original bottle.  Medication: Hydrocodone/APAP Pill/Patch Count: 15.5 of 45 pills remain Pill/Patch Appearance: Markings consistent with prescribed medication Bottle Appearance: Standard pharmacy container. Clearly labeled. Filled Date:04 / 25/ 2018 Last Medication intake:  Today   Pharmacokinetics: Liberation and absorption (onset of action): WNL Distribution (time to peak effect): WNL Metabolism and excretion (duration of action): WNL         Pharmacodynamics: Desired effects: Analgesia: Stephanie [name] reports >50% benefit. Functional ability: Patient reports that medication allows her to accomplish basic ADLs Clinically meaningful improvement in function (CMIF): Sustained CMIF goals met Perceived effectiveness: Described as relatively effective, allowing for increase in activities of daily living (ADL) Undesirable effects: Side-effects or Adverse reactions: None reported Monitoring: [place] PMP: Online review of the past 67-monthperiod conducted. Compliant with practice rules and regulations List of all UDS test(s) done:  Lab Results  Component Value Date   TOXASSSELUR FINAL 05/07/2016   TOXASSSELUR FINAL 02/12/2016   TOXASSSELUR FINAL 11/18/2015   Last UDS on record: ToxAssure Select 13  Date Value Ref Range Status  05/07/2016 FINAL  Final    Comment:    ==================================================================== TOXASSURE SELECT 13 (MW) ==================================================================== Test                             Result       Flag       Units Drug Present and Declared for Prescription Verification   Hydrocodone                    4034         EXPECTED   ng/mg creat   Hydromorphone                  443          EXPECTED   ng/mg creat   Dihydrocodeine                 143          EXPECTED   ng/mg creat   Norhydrocodone                 >2809        EXPECTED   ng/mg creat    Sources of hydrocodone include  scheduled prescription    medications. Hydromorphone, dihydrocodeine and norhydrocodone are    expected metabolites of hydrocodone. Hydromorphone and    dihydrocodeine are also available as scheduled prescription    medications. ==================================================================== Test                      Result    Flag   Units      Ref Range   Creatinine              178              mg/dL      >=20 ==================================================================== Declared Medications:  The flagging and interpretation on this report are based on the  following declared medications.  Unexpected results may arise from  inaccuracies in the declared medications.  **Note: The testing scope of this panel includes these medications:  Hydrocodone (Norco)  **Note: The testing scope of this panel does not include following  reported medications:  Acetaminophen (Norco)  Cyclobenzaprine (Flexeril)  Gabapentin  Melatonin  Meloxicam (Mobic)  Vitamin D3 ==================================================================== For clinical consultation, please call [phone] ====================================================================    UDS interpretation: Compliant  Medication Assessment Form: Reviewed. Patient indicates being compliant with therapy Treatment compliance: Compliant Risk Assessment Profile: Aberrant behavior: See prior evaluations. None observed or detected today Comorbid factors increasing risk of overdose: See prior notes. No additional risks detected today Risk of substance use disorder (SUD): Low Opioid Risk Tool (ORT) Total Score:    Interpretation Table:  Score <3 = Low Risk for SUD  Score between 4-7 = Moderate Risk for SUD  Score >8 = High Risk for Opioid Abuse   Risk Mitigation Strategies:  Patient Counseling: Covered Patient-Prescriber Agreement (PPA): Present and active  Notification to other healthcare providers:  Done  Pharmacologic Plan: No change in therapy, at this time  Post-Procedure Assessment  03/21/2017 Procedure: Diagnostic caudal epidural steroid injection + diagnostic epidurogram Pre-procedure pain score:  3/10 Post-procedure pain score: 0/10 (100% relief) Influential Factors: BMI: 33.99 kg/m Intra-procedural challenges: None observed Assessment challenges: None detected         Post-procedural side-effects, adverse reactions, or complications: None reported Reported issues: None  Sedation: Sedation provided. When no sedatives are used, the analgesic levels obtained are directly associated to the effectiveness of the local anesthetics. However, when sedation is provided, the level of analgesia obtained during the initial 1 hour following the intervention, is believed to be the result of a combination of factors. These factors may include, but are not limited to: 1. The effectiveness of the local anesthetics used. 2. The effects of the analgesic(s) and/or anxiolytic(s) used. 3. The degree of discomfort experienced by the patient at the time of the procedure. 4. The patients ability and reliability in recalling and recording the events. 5. The presence and influence of possible secondary gains and/or psychosocial factors. Reported result: Relief experienced during the 1st hour after the procedure: 100 % (Ultra-Short Term Relief) Interpretative annotation: Analgesia during this period is likely to be Local Anesthetic and/or IV Sedative (Analgesic/Anxiolitic) related.          Effects of local anesthetic: The analgesic effects attained during this period are directly associated to the localized infiltration of local anesthetics and therefore cary significant diagnostic value as to the etiological location, or anatomical origin, of the pain. Expected duration of relief is directly dependent on the pharmacodynamics of the local anesthetic used. Long-acting (4-6 hours) anesthetics used.  Reported  result: Relief during the next 4 to 6 hour after the procedure: 100 % (Short-Term Relief) Interpretative annotation: Complete relief would suggest area to be the source of the pain.          Long-term benefit: Defined as the period of time past the expected duration of local anesthetics. With the possible exception of prolonged sympathetic blockade from the local anesthetics, benefits during this period are typically attributed to, or associated with, other factors such as analgesic sensory neuropraxia, antiinflammatory effects, or beneficial biochemical changes provided by agents other than the local anesthetics Reported result: Extended relief following procedure: 80 % (Long-Term Relief) Interpretative annotation: Good relief. This could suggest inflammation to be a significant component in the etiology to the pain.          Current benefits: Defined as persistent relief that continues at this point in time.   Reported results: Treated area: 80 % [name] reports improvement in function Interpretative annotation: Ongoing benefits would suggest effective therapeutic approach  Interpretation: Results would suggest a successful diagnostic intervention.          Laboratory Chemistry  Inflammation Markers Lab Results  Component Value Date   CRP 0.7 11/18/2015  ESRSEDRATE 27 11/18/2015   (CRP: Acute Phase) (ESR: Chronic Phase) Renal Function Markers Lab Results  Component Value Date   BUN 9 11/18/2015   CREATININE 0.59 11/18/2015   GFRAA >60 11/18/2015   GFRNONAA >60 11/18/2015   Hepatic Function Markers Lab Results  Component Value Date   AST 18 11/18/2015   ALT 14 11/18/2015   ALBUMIN 4.0 11/18/2015   ALKPHOS 59 11/18/2015   Electrolytes Lab Results  Component Value Date   NA 141 11/18/2015   K 4.2 11/18/2015   CL 109 11/18/2015   CALCIUM 9.0 11/18/2015   MG 2.0 11/18/2015   Neuropathy Markers Lab Results  Component Value Date   VITAMINB12 318 05/07/2016   Bone  Pathology Markers Lab Results  Component Value Date   ALKPHOS 59 11/18/2015   25OHVITD1 34 05/07/2016   25OHVITD2 <1.0 05/07/2016   25OHVITD3 34 05/07/2016   CALCIUM 9.0 11/18/2015   Coagulation Parameters Lab Results  Component Value Date   PLT 298 04/10/2014   Cardiovascular Markers Lab Results  Component Value Date   HGB 8.7 (L) 04/10/2014   HCT 28.6 (L) 04/10/2014   Note: Lab results reviewed.  Recent Diagnostic Imaging Review  Dg C-arm 1-60 Min-no Report  Result Date: 02/01/2017 Fluoroscopy was utilized by the requesting physician.  No radiographic interpretation.   Note: Imaging results reviewed.          Meds  The patient has a current medication list which includes the following prescription(s): [facility] melatonin, cyclobenzaprine, gabapentin, meloxicam, oxycodone, oxycodone, oxycodone, trazodone, and triamcinolone ointment.  Current Outpatient Prescriptions on File Prior to Visit  Medication Sig  . gabapentin (NEURONTIN) 300 MG capsule Take 1-3 capsules (300-900 mg total) by mouth 4 (four) times daily.  Marland Kitchen triamcinolone ointment (KENALOG) 0.1 % Apply topically.   No current facility-administered medications on file prior to visit.    ROS  Constitutional: Denies any fever or chills Gastrointestinal: No reported hemesis, hematochezia, vomiting, or acute GI distress Musculoskeletal: Denies any acute onset joint swelling, redness, loss of ROM, or weakness Neurological: No reported episodes of acute onset apraxia, aphasia, dysarthria, agnosia, amnesia, paralysis, loss of coordination, or loss of consciousness  Allergies  Stephanie [name] is allergic to phenergan [promethazine hcl].  PFSH  Drug: Stephanie [name]  reports that she does not use drugs. Alcohol:  reports that she does not drink alcohol. Tobacco:  reports that she has never smoked. She has never used smokeless tobacco. Medical:  has a past medical history of Allergy; Anemia, iron deficiency (04/09/2014); Arthritis;  Atypical chest pain (04/09/2014); CD (contact dermatitis) (03/01/2014); Chest pain (04/09/2014); and Lumbar radicular pain (08/22/2015). Family: family history includes Asthma in her father; Stroke in her mother.  Past Surgical History:  Procedure Laterality Date  . APPENDECTOMY    . BACK SURGERY    . BUNIONECTOMY     BOTH FEET  . CESAREAN SECTION     X 2  . CHOLECYSTECTOMY     Constitutional Exam  General appearance: Well nourished, well developed, and well hydrated. In no apparent acute distress Vitals:   03/23/17 0802  BP: 107/81  Pulse: 68  Resp: 18  Temp: 98.2 F (36.8 C)  SpO2: 97%  Weight: 217 lb (98.4 kg)  Height: 5' 7"  (1.702 m)   BMI Assessment: Estimated body mass index is 33.99 kg/m as calculated from the following:   Height as of this encounter: 5' 7"  (1.702 m).   Weight as of this encounter: 217 lb (98.4 kg).  BMI  interpretation table: BMI level Category Range association with higher incidence of chronic pain  <18 kg/m2 Underweight   18.5-24.9 kg/m2 Ideal body weight   25-29.9 kg/m2 Overweight Increased incidence by 20%  30-34.9 kg/m2 Obese (Class I) Increased incidence by 68%  35-39.9 kg/m2 Severe obesity (Class II) Increased incidence by 136%  >40 kg/m2 Extreme obesity (Class III) Increased incidence by 254%   BMI Readings from Last 4 Encounters:  03/23/17 33.99 kg/m  02/01/17 33.20 kg/m  12/24/16 33.52 kg/m  08/03/16 33.67 kg/m   Wt Readings from Last 4 Encounters:  03/23/17 217 lb (98.4 kg)  02/01/17 212 lb (96.2 kg)  12/24/16 214 lb (97.1 kg)  08/03/16 215 lb (97.5 kg)  Psych/Mental status: Alert, oriented x 3 (person, place, & time)       Eyes: PERLA Respiratory: No evidence of acute respiratory distress  Cervical Spine Exam  Inspection: No masses, redness, or swelling Alignment: Symmetrical Functional ROM: Unrestricted ROM      Stability: No instability detected Muscle strength & Tone: Functionally intact Sensory: Unimpaired Palpation:  No palpable anomalies              Upper Extremity (UE) Exam    Side: Right upper extremity  Side: Left upper extremity  Inspection: No masses, redness, swelling, or asymmetry. No contractures  Inspection: No masses, redness, swelling, or asymmetry. No contractures  Functional ROM: Unrestricted ROM          Functional ROM: Unrestricted ROM          Muscle strength & Tone: Functionally intact  Muscle strength & Tone: Functionally intact  Sensory: Unimpaired  Sensory: Unimpaired  Palpation: No palpable anomalies              Palpation: No palpable anomalies              Specialized Test(s): Deferred         Specialized Test(s): Deferred          Thoracic Spine Exam  Inspection: No masses, redness, or swelling Alignment: Symmetrical Functional ROM: Unrestricted ROM Stability: No instability detected Sensory: Unimpaired Muscle strength & Tone: No palpable anomalies  Lumbar Spine Exam  Inspection: No masses, redness, or swelling Alignment: Symmetrical Functional ROM: Improved after treatment      Stability: No instability detected Muscle strength & Tone: Functionally intact Sensory: Unimpaired Palpation: No palpable anomalies       Provocative Tests: Lumbar Hyperextension and rotation test: evaluation deferred today       Patrick's Maneuver: evaluation deferred today                    Gait & Posture Assessment  Ambulation: Unassisted Gait: Relatively normal for age and body habitus Posture: WNL   Lower Extremity Exam    Side: Right lower extremity  Side: Left lower extremity  Inspection: No masses, redness, swelling, or asymmetry. No contractures  Inspection: No masses, redness, swelling, or asymmetry. No contractures  Functional ROM: Unrestricted ROM          Functional ROM: Unrestricted ROM          Muscle strength & Tone: Functionally intact  Muscle strength & Tone: Functionally intact  Sensory: Unimpaired  Sensory: Unimpaired  Palpation: No palpable anomalies  Palpation: No  palpable anomalies   Assessment  Primary Diagnosis & Pertinent Problem List: The primary encounter diagnosis was Chronic low back pain (Location of Primary Source of Pain) (Bilateral) (L>R). 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 Evlyn Clines, [facility]), Spondylolisthesis of lumbar region, Spasm of paraspinal muscle, Musculoskeletal pain, Insomnia secondary to chronic pain, Opiate use, Long term current use of opiate analgesic, and Neurogenic pain were also pertinent to this visit.  Status Diagnosis  Improved Improved Improved 1. Chronic low back pain (Location of Primary Source of Pain) (Bilateral) (L>R)   2. Chronic lower extremity pain (Location of Secondary source of pain) (Bilateral) (L>R)   3. Chronic knee pain (Location of Tertiary source of pain) (Bilateral) (R>L)   4. Chronic pain syndrome   5. Epidural fibrosis   6. 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 Evlyn Clines, [facility])   7. Spondylolisthesis of lumbar region   8. Spasm of paraspinal muscle   9. Musculoskeletal pain   10. Insomnia secondary to chronic pain   11. Opiate use   12. Long term current use of opiate analgesic   13. Neurogenic pain     Problems updated and reviewed during this visit: No problems updated. Plan of Care  Pharmacotherapy (Medications Ordered): Meds ordered this encounter  Medications  . traZODone (DESYREL) 100 MG tablet    Sig: Take 0.5 tablets (50 mg total) by mouth at bedtime as needed for sleep.    Dispense:  45 tablet    Refill:  0    Do not place medication on "Automatic Refill". Fill one day early if pharmacy is closed on scheduled refill date.  . cyclobenzaprine (FLEXERIL) 10 MG tablet    Sig: Take 1 tablet (10  mg total) by mouth daily.    Dispense:  90 tablet    Refill:  0    Do not place this medication, or any other prescription from our practice, on "Automatic Refill". Patient may have prescription filled one day early if pharmacy is closed on scheduled refill date.  . meloxicam (MOBIC) 15 MG tablet    Sig: Take 1 tablet (15 mg total) by mouth every morning.    Dispense:  90 tablet    Refill:  0    Do not place this medication, or any other prescription from our practice, on "Automatic Refill". Patient may have prescription filled one day early if pharmacy is closed on scheduled refill date.  . [facility] MELATONIN 10 MG CAPS    Sig: Take 10-20 mg by mouth at bedtime.    Dispense:  60 capsule    Refill:  2    Do not place medication on "Automatic Refill". Fill one day early if pharmacy is closed on scheduled refill date.  Marland Kitchen oxyCODONE (OXY IR/ROXICODONE) 5 MG immediate release tablet    Sig: Take 1 tablet (5 mg total) by mouth every 12 (twelve) hours as needed for severe pain.    Dispense:  60 tablet    Refill:  0    Do not place this medication, or any other prescription from our practice, on "Automatic Refill". Patient may have prescription filled one day early if pharmacy is closed on scheduled refill date. Do not fill until: 03/24/17 To last until: 04/23/17  . oxyCODONE (OXY IR/ROXICODONE) 5 MG immediate release tablet    Sig: Take 1 tablet (5 mg total) by mouth every 12 (twelve) hours as needed for severe pain.    Dispense:  60 tablet    Refill:  0    Do not place this medication, or any other prescription from our practice, on "Automatic Refill". Patient may have prescription filled one day early if pharmacy is closed on scheduled refill date. Do not fill until: 04/23/17 To last until: 05/23/17  . oxyCODONE (OXY IR/ROXICODONE) 5 MG immediate release tablet    Sig: Take 1 tablet (5 mg total) by mouth every 12 (twelve) hours as needed for severe pain.    Dispense:  60 tablet    Refill:  0     Do not place this medication, or any other prescription from our practice, on "Automatic Refill". Patient may have prescription filled one day early if pharmacy is closed on scheduled refill date. Do not fill until: 05/23/17 To last until: 06/22/17   New Prescriptions   OXYCODONE (OXY IR/ROXICODONE) 5 MG IMMEDIATE RELEASE TABLET    Take 1 tablet (5 mg total) by mouth every 12 (twelve) hours as needed for severe pain.   OXYCODONE (OXY IR/ROXICODONE) 5 MG IMMEDIATE RELEASE TABLET    Take 1 tablet (5 mg total) by mouth every 12 (twelve) hours as needed for severe pain.   OXYCODONE (OXY IR/ROXICODONE) 5 MG IMMEDIATE RELEASE TABLET    Take 1 tablet (5 mg total) by mouth every 12 (twelve) hours as needed for severe pain.   Medications administered today: Stephanie [name] had no medications administered during this visit. Lab-work, procedure(s), and/or referral(s): Orders Placed This Encounter  Procedures  . Caudal Epidural Injection   Imaging and/or referral(s): None  Interventional therapies: Planned, scheduled, and/or pending:   Not at this time.   Considering:   Diagnostic bilateral lumbar facet block Possible bilateral lumbar facet RFA Diagnostic Caudal ESI Diagnostic Bilateral L4-5 & L5-S1Lumbar TFESI Diagnostic bilateral sacroiliac joint block Possible bilateral sacroiliac joint RFA Diagnostic bilateral hip joint injection Possible bilateral hip joint RFA Diagnostic bilateral knee joint injection Possible series of Hyalgan knee injections Diagnostic bilateral genicular nerve block Possible bilateral genicular nerve RFA.   Palliative PRN treatment(s):   Palliative Caudal ESI   Provider-requested follow-up: Return in about 3 months (around 06/23/2017) for Med-Mgmt, w/ NP, in addition, PRN procedure(s), by MD.  Future Appointments Date Time Provider [place]  06/23/2017 8:00 AM Vevelyn Francois, NP [facility] None   Primary Care Physician: [facility] Location: [facility] Note by: Kathlen Brunswick. Dossie Arbour, M.D, DABA, DABAPM, DABPM, DABIPP, FIPP Date: 03/23/2017; Time: 11:34 AM  Patient instructions provided during this appointment: Patient Instructions   _______________________________________________________________  Preparing for Procedure with Sedation Instructions: . Oral Intake: Do not eat or drink anything for at least 8 hours prior to your procedure. . Transportation: Public transportation is not allowed. Bring an adult driver. The driver must be physically present in our waiting room before any procedure can be started. Marland Kitchen Physical Assistance: Bring an adult physically capable of assisting you, in the event you need help. This adult should keep you company at home for at least 6 hours after the procedure. . Blood Pressure Medicine: Take your blood pressure medicine with a sip of water the morning of the procedure. . Blood thinners:  . Diabetics on insulin: Notify the staff so that you can be scheduled 1st case in the morning. If your diabetes requires high dose insulin, take only  of your normal insulin dose the morning of the procedure and notify the staff that you have done so. . Preventing infections: Shower with an antibacterial soap the morning of your procedure. Marland Kitchen  Build-up your immune system: Take 1000 mg of Vitamin C with every meal (3 times a day) the day prior to your procedure. Marland Kitchen Antibiotics: Inform the staff if you have a condition or reason that requires you to take antibiotics before dental procedures. . Pregnancy: If you are pregnant, call and cancel the procedure. . Sickness: If you have a cold, fever, or any active infections, call and cancel the procedure. . Arrival: You must be in the facility at least 30 minutes prior to your scheduled procedure. . Children: Do not bring children with you. . Dress appropriately: Bring dark clothing that you would not mind if they get  stained. . Valuables: Do not bring any jewelry or valuables. Procedure appointments are reserved for interventional treatments only. Marland Kitchen No Prescription Refills. . No medication changes will be discussed during procedure appointments. . No disability issues will be discussed. ______________________________________________________________________________________________  ____________________________________________________________________________________________  Medication Rules  Applies to: All patients receiving prescriptions (written or electronic).  Pharmacy of record: Pharmacy where electronic prescriptions will be sent. If written prescriptions are taken to a different pharmacy, please inform the nursing staff. The pharmacy listed in the electronic medical record should be the one where you would like electronic prescriptions to be sent.  Prescription refills: Only during scheduled appointments. Applies to both, written and electronic prescriptions.  NOTE: The following applies primarily to controlled substances (Opioid Pain Medications)  Patient's responsibilities: 1. Pain Pills: Bring all pain pills to every appointment (except for procedure appointments). 2. Pill Bottles: Bring pills in original pharmacy bottle. Always bring newest bottle. Bring bottle, even if empty. 3. Medication refills: You are responsible for knowing and keeping track of what medications you need refilled. The day before your appointment, write a list of all prescriptions that need to be refilled. Bring that list to your appointment and give it to the admitting nurse. Prescriptions will be written only during appointments. If you forget a medication, it will not be "Called in", "Faxed", or "electronically sent". You will need to get another appointment to get these prescribed. 4. Prescription Accuracy: You are responsible for carefully inspecting your prescriptions before leaving our office. Have the discharge  nurse carefully go over each prescription with you, before taking them home. Make sure that your name is accurately spelled, that your address is correct. Check the name and dose of your medication to make sure it is accurate. Check the number of pills, and the written instructions to make sure they are clear and accurate. Make sure that you are given enough medication to last until your next medication refill appointment. 5. Taking Medication: Take medication as prescribed. Never take more pills than instructed. Never take medication more frequently than prescribed. Taking less pills or less frequently is permitted and encouraged, when it comes to controlled substances (written prescriptions).  6. Inform other Doctors: Always inform, all of your healthcare providers, of all the medications you take. 7. Pain Medication from other Providers: You are not allowed to accept any additional pain medication from any other Doctor or Healthcare provider. There are two exceptions to this rule. (see below) In the event that you require additional pain medication, you are responsible for notifying us, as stated below. 8. Medication Agreement: You are responsible for carefully reading and following our Medication Agreement. This must be signed before receiving any prescriptions from our practice. Safely store a copy of your signed Agreement. Violations to the Agreement will result in no further prescriptions. (Additional copies of our Medication Agreement are  available upon request.) 9. Laws, Rules, & Regulations: All patients are expected to follow all Federal and [employer], Rules, [employer] [facility] [employer]. Ignorance of the Laws does not constitute a valid excuse.  Exceptions: There are only two exceptions to the rule of not receiving pain medications from other Healthcare Providers. 1. Exception #1 (Emergencies): In the event of an emergency (i.e.: accident requiring emergency care), you are allowed to receive  additional pain medication. However, you are responsible for: As soon as you are able, call our office [phone], at any time of the day or night, and leave a message stating your name, the date and nature of the emergency, and the name and dose of the medication prescribed. In the event that your call is answered by a member of our staff, make sure to document and save the date, time, and the name of the person that took your information.  2. Exception #2 (Planned Surgery): In the event that you are scheduled by another doctor or dentist to have any type of surgery or procedure, you are allowed (for a period no longer than 30 days), to receive additional pain medication, for the acute post-op pain. However, in this case, you are responsible for picking up a copy of our "Post-op Pain Management for Surgeons" handout, and giving it to your surgeon or dentist. This document is available at our office, and does not require an appointment to obtain it. Simply go to our office during business hours (Monday-Thursday from 8:00 AM to 4:00 PM) (Friday 8:00 AM to 12:00 Noon) or if you have a scheduled appointment with [place], prior to your surgery, and ask for it by name. In addition, you will need to provide [place] with your name, name of your surgeon, type of surgery, and date of procedure or surgery.  _____________________________________________________________________________________________  ____________________________________________________________________________________________  Appointment Policy  It is our goal and responsibility to provide the medical community with assistance in the evaluation and management of patients with chronic pain. Unfortunately our resources are limited. Because we do not have an unlimited amount of time, or available appointments, we are required to closely monitor and manage their use. The following rules exist to maximize their use:  Patient's  responsibilities: [place]: You are required to be physically present and registered in our facility at least 30 minutes before your appointment. 11. Tardiness: The cutoff is your appointment time. If you have an appointment scheduled for 10:00 AM and you arrive at 10:01, you will be required to reschedule your appointment.  12. Plan ahead: Always assume that you will encounter traffic on your way in. Plan for it. If you are dependent on a driver, make sure they understand these rules and the need to arrive early. 13. Other appointments and responsibilities: Avoid scheduling any other appointments before or after your pain clinic appointments.  14. Be prepared: Write down everything that you need to discuss with your healthcare provider and give this information to the admitting nurse. Write down the medications that you will need refilled. Bring your pills and bottles (even the empty ones), to all of your appointments, except for those where a procedure is scheduled. 15. No children or pets: Find someone to take care of them. It is not appropriate to bring them in. 16. Scheduling changes: We request "advanced notification" of any changes or cancellations. 17. Advanced notification: Defined as a time period of more than 24 hours prior to the originally scheduled appointment. This allows for the appointment to be  offered to other patients. 18. Rescheduling: When a visit is rescheduled, it will require the cancellation of the original appointment. For this reason they both fall within the category of "Cancellations".  19. Cancellations: They require advanced notification. Any cancellation less than 24 hours before the  appointment will be recorded as a "No Show". 20. No Show: Defined as an unkept appointment where the patient failed to notify or declare to the practice their intention or inability to keep the appointment.  Corrective process for repeat offenders:  3. Tardiness: Three (3) episodes  of rescheduling due to late arrivals will be recorded as one (1) "No Show". 4. Cancellation or reschedule: Three (3) cancellations or rescheduling will be recorded as one (1) "No Show". 5. "No Shows": Three (3) "No Shows" within a 12 month period will result in discharge from the practice.  _____________________________________________________________________________________________    Epidural Steroid Injection An epidural steroid injection is a shot of steroid medicine and numbing medicine that is given into the space between the spinal cord and the bones in your back (epidural space). The shot helps relieve pain caused by an irritated or swollen nerve root. The amount of pain relief you get from the injection depends on what is causing the nerve to be swollen and irritated, and how long your pain lasts. You are more likely to benefit from this injection if your pain is strong and comes on suddenly rather than if you have had pain for a long time. Tell a health care provider about:  Any allergies you have.  All medicines you are taking, including vitamins, herbs, eye drops, creams, and over-the-counter medicines.  Any problems you or family members have had with anesthetic medicines.  Any blood disorders you have.  Any surgeries you have had.  Any medical conditions you have.  Whether you are pregnant or may be pregnant. What are the risks? Generally, this is a safe procedure. However, problems may occur, including:  Headache.  Bleeding.  Infection.  Allergic reaction to medicines.  Damage to your nerves. What happens before the procedure? Staying hydrated  Follow instructions from your health care provider about hydration, which may include:  Up to 2 hours before the procedure - you may continue to drink clear liquids, such as water, clear fruit juice, black coffee, and plain tea. Eating and drinking restrictions  Follow instructions from your health care provider about  eating and drinking, which may include:  8 hours before the procedure - stop eating heavy meals or foods such as meat, fried foods, or fatty foods.  6 hours before the procedure - stop eating light meals or foods, such as toast or cereal.  6 hours before the procedure - stop drinking milk or drinks that contain milk.  2 hours before the procedure - stop drinking clear liquids. Medicine   You may be given medicines to lower anxiety.  Ask your health care provider about:  Changing or stopping your regular medicines. This is especially important if you are taking diabetes medicines or blood thinners.  Taking medicines such as aspirin and ibuprofen. These medicines can thin your blood. Do not take these medicines before your procedure if your health care provider instructs you not to. General instructions   Plan to have someone take you home from the hospital or clinic. What happens during the procedure?  You may receive a medicine to help you relax (sedative).  You will be asked to lie on your abdomen.  The injection site will be cleaned.  A numbing medicine (local anesthetic) will be used to numb the injection site.  A needle will be inserted through your skin into the epidural space. You may feel some discomfort when this happens. An X-ray machine will be used to make sure the needle is put as close as possible to the affected nerve.  A steroid medicine and a local anesthetic will be injected into the epidural space.  The needle will be removed.  A bandage (dressing) will be put over the injection site. What happens after the procedure?  Your blood pressure, heart rate, breathing rate, and blood oxygen level will be monitored until the medicines you were given have worn off.  Your arm or leg may feel weak or numb for a few hours.  The injection site may feel sore.  Do not drive for 24 hours if you received a sedative. This information is not intended to replace advice  given to you by your health care provider. Make sure you discuss any questions you have with your health care provider. Document Released: 01/26/2008 Document Revised: 04/01/2016 Document Reviewed: 02/04/2016 Elsevier Interactive Patient Education  2017 [employer]  What are the risk, side effects and possible complications? Generally speaking, most procedures are safe.  However, with any procedure there are risks, side effects, and the possibility of complications.  The risks and complications are dependent upon the sites that are lesioned, or the type of nerve block to be performed.  The closer the procedure is to the spine, the more serious the risks are.  Great care is taken when placing the radio frequency needles, block needles or lesioning probes, but sometimes complications can occur. 1. Infection: Any time there is an injection through the skin, there is a risk of infection.  This is why sterile conditions are used for these blocks.  There are four possible types of infection. 1. Localized skin infection. 2. Central Nervous System Infection-This can be in the form of Meningitis, which can be deadly. 3. Epidural Infections-This can be in the form of an epidural abscess, which can cause pressure inside of the spine, causing compression of the spinal cord with subsequent paralysis. This would require an emergency surgery to decompress, and there are no guarantees that the patient would recover from the paralysis. 4. Discitis-This is an infection of the intervertebral discs.  It occurs in about 1% of discography procedures.  It is difficult to treat and it may lead to surgery.        2. Pain: the needles have to go through skin and soft tissues, will cause soreness.       3. Damage to internal structures:  The nerves to be lesioned may be near blood vessels or    other nerves which can be potentially damaged.       4. Bleeding: Bleeding is more common if the  patient is taking blood thinners such as  aspirin, Coumadin, Ticiid, Plavix, etc., or if he/she have some genetic predisposition  such as hemophilia. Bleeding into the spinal canal can cause compression of the spinal  cord with subsequent paralysis.  This would require an emergency surgery to  decompress and there are no guarantees that the patient would recover from the  paralysis.       5. Pneumothorax:  Puncturing of a lung is a possibility, every time a needle is introduced in  the area of the chest or upper back.  Pneumothorax refers to free air around the  collapsed lung(s), inside of the thoracic cavity (chest cavity).  Another two possible  complications related to a similar event would include: Hemothorax and Chylothorax.   These are variations of the Pneumothorax, where instead of air around the collapsed  lung(s), you may have blood or chyle, respectively.       6. Spinal headaches: They may occur with any procedures in the area of the spine.       7. Persistent CSF (Cerebro-Spinal Fluid) leakage: This is a rare problem, but may occur  with prolonged intrathecal or epidural catheters either due to the formation of a fistulous  track or a dural tear.       8. Nerve damage: By working so close to the spinal cord, there is always a possibility of  nerve damage, which could be as serious as a permanent spinal cord injury with  paralysis.       9. Death:  Although rare, severe deadly allergic reactions known as "Anaphylactic  reaction" can occur to any of the medications used.      10. Worsening of the symptoms:  We can always make thing worse.  What are the chances of something like this happening? Chances of any of this occuring are extremely low.  By statistics, you have more of a chance of getting killed in a motor vehicle accident: while driving to the hospital than any of the above occurring .  Nevertheless, you should be aware that they are possibilities.  In general, it is similar to taking a  shower.  Everybody knows that you can slip, hit your head and get killed.  Does that mean that you should not shower again?  Nevertheless always keep in mind that statistics do not mean anything if you happen to be on the wrong side of them.  Even if a procedure has a 1 (one) in a 1,000,000 (million) chance of going wrong, it you happen to be that one..Also, keep in mind that by statistics, you have more of a chance of having something go wrong when taking medications.  Who should not have this procedure? If you are on a blood thinning medication (e.g. Coumadin, Plavix, see list of "Blood Thinners"), or if you have an active infection going on, you should not have the procedure.  If you are taking any blood thinners, please inform your physician.  How should I prepare for this procedure?  Do not eat or drink anything at least six hours prior to the procedure.  Bring a driver with you .  It cannot be a taxi.  Come accompanied by an adult that can drive you back, and that is strong enough to help you if your legs get weak or numb from the local anesthetic.  Take all of your medicines the morning of the procedure with just enough water to swallow them.  If you have diabetes, make sure that you are scheduled to have your procedure done first thing in the morning, whenever possible.  If you have diabetes, take only half of your insulin dose and notify our nurse that you have done so as soon as you arrive at the clinic.  If you are diabetic, but only take blood sugar pills (oral hypoglycemic), then do not take them on the morning of your procedure.  You may take them after you have had the procedure.  Do not take aspirin or any aspirin-containing medications, at least eleven (11) days prior to the procedure.  They may prolong bleeding.  Wear loose fitting clothing that may be easy to take off and that you would not mind if it got stained with Betadine or blood.  Do not wear any jewelry or  perfume  Remove any nail coloring.  It will interfere with some of our monitoring equipment.  NOTE: Remember that this is not meant to be interpreted as a complete list of all possible complications.  Unforeseen problems may occur.  BLOOD THINNERS The following drugs contain aspirin or other products, which can cause increased bleeding during surgery and should not be taken for 2 weeks prior to and 1 week after surgery.  If you should need take something for relief of minor pain, you may take acetaminophen which is found in Tylenol,m Datril, Anacin-3 and Panadol. It is not blood thinner. The products listed below are.  Do not take any of the products listed below in addition to any listed on your instruction sheet.  A.P.C or A.P.C with Codeine Codeine Phosphate Capsules #3 Ibuprofen Ridaura  ABC compound Congesprin Imuran rimadil  Advil Cope Indocin Robaxisal  Alka-Seltzer Effervescent Pain Reliever and Antacid Coricidin or Coricidin-D  Indomethacin Rufen  Alka-Seltzer plus Cold Medicine Cosprin Ketoprofen S-A-C Tablets  Anacin Analgesic Tablets or Capsules Coumadin Korlgesic Salflex  Anacin Extra Strength Analgesic tablets or capsules CP-2 Tablets Lanoril Salicylate  Anaprox Cuprimine Capsules Levenox Salocol  Anexsia-D Dalteparin Magan Salsalate  Anodynos Darvon compound Magnesium Salicylate Sine-off  Ansaid Dasin Capsules Magsal Sodium Salicylate  Anturane Depen Capsules Marnal Soma  APF Arthritis pain formula Dewitt's Pills Measurin Stanback  Argesic Dia-Gesic Meclofenamic Sulfinpyrazone  Arthritis Bayer Timed Release Aspirin Diclofenac Meclomen Sulindac  Arthritis pain formula Anacin Dicumarol Medipren Supac  Analgesic (Safety coated) Arthralgen Diffunasal Mefanamic Suprofen  Arthritis Strength Bufferin Dihydrocodeine Mepro Compound Suprol  Arthropan liquid Dopirydamole Methcarbomol with Aspirin Synalgos  ASA tablets/Enseals Disalcid Micrainin Tagament  Ascriptin Doan's Midol  Talwin  Ascriptin A/D Dolene Mobidin Tanderil  Ascriptin Extra Strength Dolobid Moblgesic Ticlid  Ascriptin with Codeine Doloprin or Doloprin with Codeine Momentum Tolectin  Asperbuf Duoprin Mono-gesic Trendar  Aspergum Duradyne Motrin or Motrin IB Triminicin  Aspirin plain, buffered or enteric coated Durasal Myochrisine Trigesic  Aspirin Suppositories Easprin Nalfon Trillsate  Aspirin with Codeine Ecotrin Regular or Extra Strength Naprosyn Uracel  Atromid-S Efficin Naproxen Ursinus  Auranofin Capsules Elmiron Neocylate Vanquish  Axotal Emagrin Norgesic Verin  Azathioprine Empirin or Empirin with Codeine Normiflo Vitamin E  Azolid Emprazil Nuprin Voltaren  Bayer Aspirin plain, buffered or children's or timed BC Tablets or powders Encaprin Orgaran Warfarin Sodium  Buff-a-Comp Enoxaparin Orudis Zorpin  Buff-a-Comp with Codeine Equegesic Os-Cal-Gesic   Buffaprin Excedrin plain, buffered or Extra Strength Oxalid   Bufferin Arthritis Strength Feldene Oxphenbutazone   Bufferin plain or Extra Strength Feldene Capsules Oxycodone with Aspirin   Bufferin with Codeine Fenoprofen Fenoprofen Pabalate or Pabalate-SF   Buffets II Flogesic Panagesic   Buffinol plain or Extra Strength Florinal or Florinal with Codeine Panwarfarin   Buf-Tabs Flurbiprofen Penicillamine   Butalbital Compound Four-way cold tablets Penicillin   Butazolidin Fragmin Pepto-Bismol   Carbenicillin Geminisyn Percodan   Carna Arthritis Reliever Geopen Persantine   Carprofen Gold's salt Persistin   Chloramphenicol Goody's Phenylbutazone   Chloromycetin Haltrain Piroxlcam   Clmetidine heparin Plaquenil   Cllnoril Hyco-pap Ponstel   Clofibrate Hydroxy chloroquine Propoxyphen         Before stopping any of these medications, be sure to consult the physician who ordered them.  Some, such as Coumadin (Warfarin) are ordered to prevent  or treat serious conditions such as "deep thrombosis", "pumonary embolisms", and other heart  problems.  The amount of time that you may need off of the medication may also vary with the medication and the reason for which you were taking it.  If you are taking any of these medications, please make sure you notify your pain physician before you undergo any procedures.

## 2017-03-23 NOTE — Telephone Encounter (Signed)
Patient called to let Dr. Dossie Arbour know :  Gabapentin is 300mg  and she does have enough to last until next appt

## 2017-03-23 NOTE — Progress Notes (Signed)
Nursing Pain Medication Assessment:  Safety precautions to be maintained throughout the outpatient stay will include: orient to surroundings, keep bed in low position, maintain call bell within reach at all times, provide assistance with transfer out of bed and ambulation.  Medication Inspection Compliance: Pill count conducted under aseptic conditions, in front of the patient. Neither the pills nor the bottle was removed from the patient's sight at any time. Once count was completed pills were immediately returned to the patient in their original bottle.  Medication: Hydrocodone/APAP Pill/Patch Count: 15.5 of 45 pills remain Pill/Patch Appearance: Markings consistent with prescribed medication Bottle Appearance: Standard pharmacy container. Clearly labeled. Filled Date:04 / 25/ 2018 Last Medication intake:  Today

## 2017-03-23 NOTE — Patient Instructions (Addendum)
_______________________________________________________________  Preparing for Procedure with Sedation Instructions: . Oral Intake: Do not eat or drink anything for at least 8 hours prior to your procedure. . Transportation: Public transportation is not allowed. Bring an adult driver. The driver must be physically present in our waiting room before any procedure can be started. Marland Kitchen Physical Assistance: Bring an adult physically capable of assisting you, in the event you need help. This adult should keep you company at home for at least 6 hours after the procedure. . Blood Pressure Medicine: Take your blood pressure medicine with a sip of water the morning of the procedure. . Blood thinners:  . Diabetics on insulin: Notify the staff so that you can be scheduled 1st case in the morning. If your diabetes requires high dose insulin, take only  of your normal insulin dose the morning of the procedure and notify the staff that you have done so. . Preventing infections: Shower with an antibacterial soap the morning of your procedure. . Build-up your immune system: Take 1000 mg of Vitamin C with every meal (3 times a day) the day prior to your procedure. Marland Kitchen Antibiotics: Inform the staff if you have a condition or reason that requires you to take antibiotics before dental procedures. . Pregnancy: If you are pregnant, call and cancel the procedure. . Sickness: If you have a cold, fever, or any active infections, call and cancel the procedure. . Arrival: You must be in the facility at least 30 minutes prior to your scheduled procedure. . Children: Do not bring children with you. . Dress appropriately: Bring dark clothing that you would not mind if they get stained. . Valuables: Do not bring any jewelry or valuables. Procedure appointments are reserved for interventional treatments only. Marland Kitchen No Prescription Refills. . No medication changes will be discussed during procedure appointments. . No disability issues  will be discussed. ______________________________________________________________________________________________  ____________________________________________________________________________________________  Medication Rules  Applies to: All patients receiving prescriptions (written or electronic).  Pharmacy of record: Pharmacy where electronic prescriptions will be sent. If written prescriptions are taken to a different pharmacy, please inform the nursing staff. The pharmacy listed in the electronic medical record should be the one where you would like electronic prescriptions to be sent.  Prescription refills: Only during scheduled appointments. Applies to both, written and electronic prescriptions.  NOTE: The following applies primarily to controlled substances (Opioid Pain Medications)  Patient's responsibilities: 1. Pain Pills: Bring all pain pills to every appointment (except for procedure appointments). 2. Pill Bottles: Bring pills in original pharmacy bottle. Always bring newest bottle. Bring bottle, even if empty. 3. Medication refills: You are responsible for knowing and keeping track of what medications you need refilled. The day before your appointment, write a list of all prescriptions that need to be refilled. Bring that list to your appointment and give it to the admitting nurse. Prescriptions will be written only during appointments. If you forget a medication, it will not be "Called in", "Faxed", or "electronically sent". You will need to get another appointment to get these prescribed. 4. Prescription Accuracy: You are responsible for carefully inspecting your prescriptions before leaving our office. Have the discharge nurse carefully go over each prescription with you, before taking them home. Make sure that your name is accurately spelled, that your address is correct. Check the name and dose of your medication to make sure it is accurate. Check the number of pills, and the  written instructions to make sure they are clear and accurate. Make sure  that you are given enough medication to last until your next medication refill appointment. 5. Taking Medication: Take medication as prescribed. Never take more pills than instructed. Never take medication more frequently than prescribed. Taking less pills or less frequently is permitted and encouraged, when it comes to controlled substances (written prescriptions).  6. Inform other Doctors: Always inform, all of your healthcare providers, of all the medications you take. 7. Pain Medication from other Providers: You are not allowed to accept any additional pain medication from any other Doctor or Healthcare provider. There are two exceptions to this rule. (see below) In the event that you require additional pain medication, you are responsible for notifying us, as stated below. 8. Medication Agreement: You are responsible for carefully reading and following our Medication Agreement. This must be signed before receiving any prescriptions from our practice. Safely store a copy of your signed Agreement. Violations to the Agreement will result in no further prescriptions. (Additional copies of our Medication Agreement are available upon request.) 9. Laws, Rules, & Regulations: All patients are expected to follow all Federal and [employer], Rules, [employer] [facility]. Ignorance of the Laws does not constitute a valid excuse.  Exceptions: There are only two exceptions to the rule of not receiving pain medications from other Healthcare Providers. 1. Exception #1 (Emergencies): In the event of an emergency (i.e.: accident requiring emergency care), you are allowed to receive additional pain medication. However, you are responsible for: As soon as you are able, call our office [phone], at any time of the day or night, and leave a message stating your name, the date and nature of the emergency, and the name and dose of the medication  prescribed. In the event that your call is answered by a member of our staff, make sure to document and save the date, time, and the name of the person that took your information.  2. Exception #2 (Planned Surgery): In the event that you are scheduled by another doctor or dentist to have any type of surgery or procedure, you are allowed (for a period no longer than 30 days), to receive additional pain medication, for the acute post-op pain. However, in this case, you are responsible for picking up a copy of our "Post-op Pain Management for Surgeons" handout, and giving it to your surgeon or dentist. This document is available at our office, and does not require an appointment to obtain it. Simply go to our office during business hours (Monday-Thursday from 8:00 AM to 4:00 PM) (Friday 8:00 AM to 12:00 Noon) or if you have a scheduled appointment with [place], prior to your surgery, and ask for it by name. In addition, you will need to provide [place] with your name, name of your surgeon, type of surgery, and date of procedure or surgery.  _____________________________________________________________________________________________  ____________________________________________________________________________________________  Appointment Policy  It is our goal and responsibility to provide the medical community with assistance in the evaluation and management of patients with chronic pain. Unfortunately our resources are limited. Because we do not have an unlimited amount of time, or available appointments, we are required to closely monitor and manage their use. The following rules exist to maximize their use:  Patient's responsibilities: Jette: You are required to be physically present and registered in our facility at least 30 minutes before your appointment. 11. Tardiness: The cutoff is your appointment time. If you have an appointment scheduled for 10:00 AM and you arrive at 10:01, you will be  required to  reschedule your appointment.  12. Plan ahead: Always assume that you will encounter traffic on your way in. Plan for it. If you are dependent on a driver, make sure they understand these rules and the need to arrive early. 13. Other appointments and responsibilities: Avoid scheduling any other appointments before or after your pain clinic appointments.  14. Be prepared: Write down everything that you need to discuss with your healthcare provider and give this information to the admitting nurse. Write down the medications that you will need refilled. Bring your pills and bottles (even the empty ones), to all of your appointments, except for those where a procedure is scheduled. 15. No children or pets: Find someone to take care of them. It is not appropriate to bring them in. 16. Scheduling changes: We request "advanced notification" of any changes or cancellations. 17. Advanced notification: Defined as a time period of more than 24 hours prior to the originally scheduled appointment. This allows for the appointment to be offered to other patients. 18. Rescheduling: When a visit is rescheduled, it will require the cancellation of the original appointment. For this reason they both fall within the category of "Cancellations".  19. Cancellations: They require advanced notification. Any cancellation less than 24 hours before the  appointment will be recorded as a "No Show". 20. No Show: Defined as an unkept appointment where the patient failed to notify or declare to the practice their intention or inability to keep the appointment.  Corrective process for repeat offenders:  3. Tardiness: Three (3) episodes of rescheduling due to late arrivals will be recorded as one (1) "No Show". 4. Cancellation or reschedule: Three (3) cancellations or rescheduling will be recorded as one (1) "No Show". 5. "No Shows": Three (3) "No Shows" within a 12 month period will result in discharge from the  practice.  _____________________________________________________________________________________________    Epidural Steroid Injection An epidural steroid injection is a shot of steroid medicine and numbing medicine that is given into the space between the spinal cord and the bones in your back (epidural space). The shot helps relieve pain caused by an irritated or swollen nerve root. The amount of pain relief you get from the injection depends on what is causing the nerve to be swollen and irritated, and how long your pain lasts. You are more likely to benefit from this injection if your pain is strong and comes on suddenly rather than if you have had pain for a long time. Tell a health care provider about:  Any allergies you have.  All medicines you are taking, including vitamins, herbs, eye drops, creams, and over-the-counter medicines.  Any problems you or family members have had with anesthetic medicines.  Any blood disorders you have.  Any surgeries you have had.  Any medical conditions you have.  Whether you are pregnant or may be pregnant. What are the risks? Generally, this is a safe procedure. However, problems may occur, including:  Headache.  Bleeding.  Infection.  Allergic reaction to medicines.  Damage to your nerves. What happens before the procedure? Staying hydrated  Follow instructions from your health care provider about hydration, which may include:  Up to 2 hours before the procedure - you may continue to drink clear liquids, such as water, clear fruit juice, black coffee, and plain tea. Eating and drinking restrictions  Follow instructions from your health care provider about eating and drinking, which may include:  8 hours before the procedure - stop eating heavy meals or foods such  as meat, fried foods, or fatty foods.  6 hours before the procedure - stop eating light meals or foods, such as toast or cereal.  6 hours before the procedure - stop  drinking milk or drinks that contain milk.  2 hours before the procedure - stop drinking clear liquids. Medicine   You may be given medicines to lower anxiety.  Ask your health care provider about:  Changing or stopping your regular medicines. This is especially important if you are taking diabetes medicines or blood thinners.  Taking medicines such as aspirin and ibuprofen. These medicines can thin your blood. Do not take these medicines before your procedure if your health care provider instructs you not to. General instructions   Plan to have someone take you home from the hospital or clinic. What happens during the procedure?  You may receive a medicine to help you relax (sedative).  You will be asked to lie on your abdomen.  The injection site will be cleaned.  A numbing medicine (local anesthetic) will be used to numb the injection site.  A needle will be inserted through your skin into the epidural space. You may feel some discomfort when this happens. An X-ray machine will be used to make sure the needle is put as close as possible to the affected nerve.  A steroid medicine and a local anesthetic will be injected into the epidural space.  The needle will be removed.  A bandage (dressing) will be put over the injection site. What happens after the procedure?  Your blood pressure, heart rate, breathing rate, and blood oxygen level will be monitored until the medicines you were given have worn off.  Your arm or leg may feel weak or numb for a few hours.  The injection site may feel sore.  Do not drive for 24 hours if you received a sedative. This information is not intended to replace advice given to you by your health care provider. Make sure you discuss any questions you have with your health care provider. Document Released: 01/26/2008 Document Revised: 04/01/2016 Document Reviewed: 02/04/2016 Elsevier Interactive Patient Education  2017 [place]  What are the risk, side effects and possible complications? Generally speaking, most procedures are safe.  However, with any procedure there are risks, side effects, and the possibility of complications.  The risks and complications are dependent upon the sites that are lesioned, or the type of nerve block to be performed.  The closer the procedure is to the spine, the more serious the risks are.  Great care is taken when placing the radio frequency needles, block needles or lesioning probes, but sometimes complications can occur. 1. Infection: Any time there is an injection through the skin, there is a risk of infection.  This is why sterile conditions are used for these blocks.  There are four possible types of infection. 1. Localized skin infection. 2. Central Nervous System Infection-This can be in the form of Meningitis, which can be deadly. 3. Epidural Infections-This can be in the form of an epidural abscess, which can cause pressure inside of the spine, causing compression of the spinal cord with subsequent paralysis. This would require an emergency surgery to decompress, and there are no guarantees that the patient would recover from the paralysis. 4. Discitis-This is an infection of the intervertebral discs.  It occurs in about 1% of discography procedures.  It is difficult to treat and it may lead to surgery.  2. Pain: the needles have to go through skin and soft tissues, will cause soreness.       3. Damage to internal structures:  The nerves to be lesioned may be near blood vessels or    other nerves which can be potentially damaged.       4. Bleeding: Bleeding is more common if the patient is taking blood thinners such as  aspirin, Coumadin, Ticiid, Plavix, etc., or if he/she have some genetic predisposition  such as hemophilia. Bleeding into the spinal canal can cause compression of the spinal  cord with subsequent paralysis.  This would require an emergency  surgery to  decompress and there are no guarantees that the patient would recover from the  paralysis.       5. Pneumothorax:  Puncturing of a lung is a possibility, every time a needle is introduced in  the area of the chest or upper back.  Pneumothorax refers to free air around the  collapsed lung(s), inside of the thoracic cavity (chest cavity).  Another two possible  complications related to a similar event would include: Hemothorax and Chylothorax.   These are variations of the Pneumothorax, where instead of air around the collapsed  lung(s), you may have blood or chyle, respectively.       6. Spinal headaches: They may occur with any procedures in the area of the spine.       7. Persistent CSF (Cerebro-Spinal Fluid) leakage: This is a rare problem, but may occur  with prolonged intrathecal or epidural catheters either due to the formation of a fistulous  track or a dural tear.       8. Nerve damage: By working so close to the spinal cord, there is always a possibility of  nerve damage, which could be as serious as a permanent spinal cord injury with  paralysis.       9. Death:  Although rare, severe deadly allergic reactions known as "Anaphylactic  reaction" can occur to any of the medications used.      10. Worsening of the symptoms:  We can always make thing worse.  What are the chances of something like this happening? Chances of any of this occuring are extremely low.  By statistics, you have more of a chance of getting killed in a motor vehicle accident: while driving to the hospital than any of the above occurring .  Nevertheless, you should be aware that they are possibilities.  In general, it is similar to taking a shower.  Everybody knows that you can slip, hit your head and get killed.  Does that mean that you should not shower again?  Nevertheless always keep in mind that statistics do not mean anything if you happen to be on the wrong side of them.  Even if a procedure has a 1 (one) in a  1,000,000 (million) chance of going wrong, it you happen to be that one..Also, keep in mind that by statistics, you have more of a chance of having something go wrong when taking medications.  Who should not have this procedure? If you are on a blood thinning medication (e.g. Coumadin, Plavix, see list of "Blood Thinners"), or if you have an active infection going on, you should not have the procedure.  If you are taking any blood thinners, please inform your physician.  How should I prepare for this procedure?  Do not eat or drink anything at least six hours prior to the procedure.  Bring a driver  with you .  It cannot be a taxi.  Come accompanied by an adult that can drive you back, and that is strong enough to help you if your legs get weak or numb from the local anesthetic.  Take all of your medicines the morning of the procedure with just enough water to swallow them.  If you have diabetes, make sure that you are scheduled to have your procedure done first thing in the morning, whenever possible.  If you have diabetes, take only half of your insulin dose and notify our nurse that you have done so as soon as you arrive at the clinic.  If you are diabetic, but only take blood sugar pills (oral hypoglycemic), then do not take them on the morning of your procedure.  You may take them after you have had the procedure.  Do not take aspirin or any aspirin-containing medications, at least eleven (11) days prior to the procedure.  They may prolong bleeding.  Wear loose fitting clothing that may be easy to take off and that you would not mind if it got stained with Betadine or blood.  Do not wear any jewelry or perfume  Remove any nail coloring.  It will interfere with some of our monitoring equipment.  NOTE: Remember that this is not meant to be interpreted as a complete list of all possible complications.  Unforeseen problems may occur.  BLOOD THINNERS The following drugs contain aspirin  or other products, which can cause increased bleeding during surgery and should not be taken for 2 weeks prior to and 1 week after surgery.  If you should need take something for relief of minor pain, you may take acetaminophen which is found in Tylenol,m Datril, Anacin-3 and Panadol. It is not blood thinner. The products listed below are.  Do not take any of the products listed below in addition to any listed on your instruction sheet.  A.P.C or A.P.C with Codeine Codeine Phosphate Capsules #3 Ibuprofen Ridaura  ABC compound Congesprin Imuran rimadil  Advil Cope Indocin Robaxisal  Alka-Seltzer Effervescent Pain Reliever and Antacid Coricidin or Coricidin-D  Indomethacin Rufen  Alka-Seltzer plus Cold Medicine Cosprin Ketoprofen S-A-C Tablets  Anacin Analgesic Tablets or Capsules Coumadin Korlgesic Salflex  Anacin Extra Strength Analgesic tablets or capsules CP-2 Tablets Lanoril Salicylate  Anaprox Cuprimine Capsules Levenox Salocol  Anexsia-D Dalteparin Magan Salsalate  Anodynos Darvon compound Magnesium Salicylate Sine-off  Ansaid Dasin Capsules Magsal Sodium Salicylate  Anturane Depen Capsules Marnal Soma  APF Arthritis pain formula Dewitt's Pills Measurin Stanback  Argesic Dia-Gesic Meclofenamic Sulfinpyrazone  Arthritis Bayer Timed Release Aspirin Diclofenac Meclomen Sulindac  Arthritis pain formula Anacin Dicumarol Medipren Supac  Analgesic (Safety coated) Arthralgen Diffunasal Mefanamic Suprofen  Arthritis Strength Bufferin Dihydrocodeine Mepro Compound Suprol  Arthropan liquid Dopirydamole Methcarbomol with Aspirin Synalgos  ASA tablets/Enseals Disalcid Micrainin Tagament  Ascriptin Doan's Midol Talwin  Ascriptin A/D Dolene Mobidin Tanderil  Ascriptin Extra Strength Dolobid Moblgesic Ticlid  Ascriptin with Codeine Doloprin or Doloprin with Codeine Momentum Tolectin  Asperbuf Duoprin Mono-gesic Trendar  Aspergum Duradyne Motrin or Motrin IB Triminicin  Aspirin plain, buffered or  enteric coated Durasal Myochrisine Trigesic  Aspirin Suppositories Easprin Nalfon Trillsate  Aspirin with Codeine Ecotrin Regular or Extra Strength Naprosyn Uracel  Atromid-S Efficin Naproxen Ursinus  Auranofin Capsules Elmiron Neocylate Vanquish  Axotal Emagrin Norgesic Verin  Azathioprine Empirin or Empirin with Codeine Normiflo Vitamin E  Azolid Emprazil Nuprin Voltaren  Bayer Aspirin plain, buffered or children's or timed BC Tablets or powders  Encaprin Orgaran Warfarin Sodium  Buff-a-Comp Enoxaparin Orudis Zorpin  Buff-a-Comp with Codeine Equegesic Os-Cal-Gesic   Buffaprin Excedrin plain, buffered or Extra Strength Oxalid   Bufferin Arthritis Strength Feldene Oxphenbutazone   Bufferin plain or Extra Strength Feldene Capsules Oxycodone with Aspirin   Bufferin with Codeine Fenoprofen Fenoprofen Pabalate or Pabalate-SF   Buffets II Flogesic Panagesic   Buffinol plain or Extra Strength Florinal or Florinal with Codeine Panwarfarin   Buf-Tabs Flurbiprofen Penicillamine   Butalbital Compound Four-way cold tablets Penicillin   Butazolidin Fragmin Pepto-Bismol   Carbenicillin Geminisyn Percodan   Carna Arthritis Reliever Geopen Persantine   Carprofen Gold's salt Persistin   Chloramphenicol Goody's Phenylbutazone   Chloromycetin Haltrain Piroxlcam   Clmetidine heparin Plaquenil   Cllnoril Hyco-pap Ponstel   Clofibrate Hydroxy chloroquine Propoxyphen         Before stopping any of these medications, be sure to consult the physician who ordered them.  Some, such as Coumadin (Warfarin) are ordered to prevent or treat serious conditions such as "deep thrombosis", "pumonary embolisms", and other heart problems.  The amount of time that you may need off of the medication may also vary with the medication and the reason for which you were taking it.  If you are taking any of these medications, please make sure you notify your pain physician before you undergo any procedures.

## 2017-05-02 ENCOUNTER — Other Ambulatory Visit: Payer: Self-pay | Admitting: Pain Medicine

## 2017-05-02 DIAGNOSIS — M792 Neuralgia and neuritis, unspecified: Secondary | ICD-10-CM

## 2017-06-19 ENCOUNTER — Other Ambulatory Visit: Payer: Self-pay | Admitting: Pain Medicine

## 2017-06-19 DIAGNOSIS — G4701 Insomnia due to medical condition: Secondary | ICD-10-CM

## 2017-06-19 DIAGNOSIS — M62830 Muscle spasm of back: Secondary | ICD-10-CM

## 2017-06-19 DIAGNOSIS — G8929 Other chronic pain: Secondary | ICD-10-CM

## 2017-06-19 DIAGNOSIS — M7918 Myalgia, other site: Secondary | ICD-10-CM

## 2017-06-21 NOTE — Progress Notes (Signed)
Patient's Name: Stephanie [name]  MRN: [id number]  Referring Provider: [facility]  DOB: Jun 13, 1961  PCP: [facility]  DOS: 06/23/2017  Note by: Vevelyn Francois NP  Service setting: Ambulatory outpatient  Specialty: Interventional Pain Management  Location: [facility] (AMB) Pain Management Facility    Patient type: Established    Primary Reason(s) for Visit: Encounter for prescription drug management. (Level of risk: moderate)  CC: Back Pain (lower bilateral)  HPI  Stephanie [name] is a 56 y.o. year old, female patient, who comes today for a medication management evaluation. 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 Her  primarily concern today is the Back Pain (lower bilateral)  Pain Assessment: Location: Lower, Left, Right Back Radiating: down both legs to the feet intermittently.  cramps have gotten worse on the inner aspect of her legs Onset: More than a month ago Duration: Chronic pain Quality: Cramping, Sharp, Constant, Throbbing Severity: 1 /10 (self-reported pain score)  Note: Reported level is compatible with observation.                   Effect on ADL: pain med wears off really fast and then it is difficult to keep going Timing: Constant Modifying factors: pills help for a short period of time.  pillows under knees helps her leg pain as well as back pain  Stephanie [name] was last scheduled for an appointment on Visit date not found for medication management. During today's appointment we reviewed [name]'s chronic pain status, as well as her outpatient medication regimen. She admits that she is having headaches since the switch from [place] to Oxycodone. The desire was not to have patients on daily Tylenol prescribed regimen.  She states that she is having headaches with every dose of the oxycodone . She deneis any prior headaches. She admits that she is having more irritability. She admits that the treated for depression and anxiety only secondary to divorce. She states she would like something to "help calm her down."  The patient  reports that she does not use drugs. Her body mass index is 33.99 kg/m.  Further details on both, my assessment(s), as well as the proposed treatment plan, please see below.  Controlled Substance Pharmacotherapy Assessment REMS (Risk Evaluation and Mitigation Strategy)  Analgesic:oxycodone IR 5 mg 1 tablet by mouth twice a day (10 mg/day of oxycodone)  MME/day:64m/day.  PJanett Billow RN  06/23/2017  8:16 AM  Sign at close encounter  Nursing Pain Medication Assessment:  Safety precautions to be maintained throughout the outpatient stay will include: orient to  surroundings, keep bed in low position, maintain call bell within reach at all times, provide assistance with transfer out of bed and ambulation.  Medication Inspection Compliance: Pill count conducted under aseptic conditions, in front of the patient. Neither the pills nor the bottle was removed from the patient's sight at any time. Once count was completed pills were immediately returned to the patient in their original bottle.  Medication: Oxycodone IR Pill/Patch Count: 11 of 60 pills remain Pill/Patch Appearance: Markings consistent with prescribed medication Bottle Appearance: Standard pharmacy container. Clearly labeled. Filled Date: 07 / 26 / 2018 Last Medication intake:  Today   Pharmacokinetics: Liberation and absorption (onset of action): WNL Distribution (time to peak effect): WNL Metabolism and excretion (duration of action): WNL         Pharmacodynamics: Desired effects: Analgesia: Stephanie [name] reports >50% benefit. Functional ability: Patient reports that medication allows her to accomplish basic ADLs Clinically meaningful improvement in function (CMIF): Sustained CMIF goals met Perceived effectiveness: Described as relatively effective, allowing for increase in activities of daily living (ADL) Undesirable effects: Side-effects or Adverse reactions: None reported Monitoring: [place] PMP: Online review of the past 36-monthperiod conducted. Compliant with practice rules and regulations List of all UDS test(s) done:  Lab Results  Component Value Date   TOXASSSELUR FINAL 05/07/2016   TOXASSSELUR FINAL 02/12/2016   TOXASSSELUR FINAL 11/18/2015   Last UDS on record: ToxAssure Select 13  Date Value Ref Range Status  05/07/2016 FINAL  Final    Comment:    ==================================================================== TOXASSURE SELECT 13 (MW) ==================================================================== Test                             Result       Flag        Units Drug Present and Declared for Prescription Verification   Hydrocodone                    4034         EXPECTED   ng/mg creat   Hydromorphone                  443          EXPECTED   ng/mg creat   Dihydrocodeine                 143          EXPECTED   ng/mg creat   Norhydrocodone                 >2809        EXPECTED   ng/mg creat    Sources of hydrocodone include scheduled prescription    medications. Hydromorphone, dihydrocodeine and norhydrocodone are    expected metabolites of hydrocodone. Hydromorphone and    dihydrocodeine are also available as scheduled prescription    medications. ==================================================================== Test                      Result    Flag   Units      Ref Range   Creatinine              178              mg/dL      >=20 ==================================================================== Declared Medications:  The flagging and interpretation  on this report are based on the  following declared medications.  Unexpected results may arise from  inaccuracies in the declared medications.  **Note: The testing scope of this panel includes these medications:  Hydrocodone (Norco)  **Note: The testing scope of this panel does not include following  reported medications:  Acetaminophen (Norco)  Cyclobenzaprine (Flexeril)  Gabapentin  Melatonin  Meloxicam (Mobic)  Vitamin D3 ==================================================================== For clinical consultation, please call [phone]. ====================================================================    UDS interpretation: Compliant          Medication Assessment Form: Reviewed. Patient indicates being compliant with therapy Treatment compliance: Compliant Risk Assessment Profile: Aberrant behavior: See prior evaluations. None observed or detected today Comorbid factors increasing risk of overdose: See prior notes. No additional risks detected today Risk of  substance use disorder (SUD): Low     Opioid Risk Tool - 06/23/17 0811      Family History of Substance Abuse   Alcohol Negative   Illegal Drugs Positive Female  niece   Rx Drugs Negative     Personal History of Substance Abuse   Alcohol Negative   Illegal Drugs Negative   Rx Drugs Negative     Psychological Disease   Psychological Disease Positive   ADD Negative   OCD Negative   Bipolar Negative   Schizophrenia Negative   Depression Positive  patient states that she has occassions where she gets very mad over nothing and is diffiuclt to control     Total Score   Opioid Risk Tool Scoring 5   Opioid Risk Interpretation Moderate Risk     ORT Scoring interpretation table:  Score <3 = Low Risk for SUD  Score between 4-7 = Moderate Risk for SUD  Score >8 = High Risk for Opioid Abuse   Risk Mitigation Strategies:  Patient Counseling: Covered Patient-Prescriber Agreement (PPA): Present and active  Notification to other healthcare providers: Done  Pharmacologic Plan: No change in therapy, at this time  Laboratory Chemistry  Inflammation Markers (CRP: Acute Phase) (ESR: Chronic Phase) Lab Results  Component Value Date   CRP 0.7 11/18/2015   ESRSEDRATE 27 11/18/2015                 Renal Function Markers Lab Results  Component Value Date   BUN 8 06/23/2017   CREATININE 0.59 06/23/2017   GFRAA >60 06/23/2017   GFRNONAA >60 06/23/2017                 Hepatic Function Markers Lab Results  Component Value Date   AST 17 06/23/2017   ALT 13 (L) 06/23/2017   ALBUMIN 4.2 06/23/2017   ALKPHOS 60 06/23/2017                 Electrolytes Lab Results  Component Value Date   NA 142 06/23/2017   K 4.0 06/23/2017   CL 107 06/23/2017   CALCIUM 9.4 06/23/2017   MG 2.0 06/23/2017                 Neuropathy Markers Lab Results  Component Value Date   VITAMINB12 318 05/07/2016                 Bone Pathology Markers Lab Results  Component Value Date   ALKPHOS  60 06/23/2017   25OHVITD1 34 05/07/2016   25OHVITD2 <1.0 05/07/2016   25OHVITD3 34 05/07/2016   CALCIUM 9.4 06/23/2017                 Coagulation  Parameters Lab Results  Component Value Date   PLT 298 04/10/2014                 Cardiovascular Markers Lab Results  Component Value Date   HGB 8.7 (L) 04/10/2014   HCT 28.6 (L) 04/10/2014                 Note: Lab results reviewed.  Recent Diagnostic Imaging Review  Dg C-arm 1-60 Min-no Report  Result Date: 02/01/2017 Fluoroscopy was utilized by the requesting physician.  No radiographic interpretation.   Note: Imaging results reviewed.          Meds   Current Meds  Medication Sig  . triamcinolone ointment (KENALOG) 0.1 % Apply topically.    ROS  Constitutional: Denies any fever or chills Gastrointestinal: No reported hemesis, hematochezia, vomiting, or acute GI distress Musculoskeletal: Denies any acute onset joint swelling, redness, loss of ROM, or weakness Neurological: No reported episodes of acute onset apraxia, aphasia, dysarthria, agnosia, amnesia, paralysis, loss of coordination, or loss of consciousness  Allergies  Stephanie [name] is allergic to phenergan [promethazine hcl].  PFSH  Drug: Stephanie [name]  reports that she does not use drugs. Alcohol:  reports that she does not drink alcohol. Tobacco:  reports that she has never smoked. She has never used smokeless tobacco. Medical:  has a past medical history of Allergy; Anemia, iron deficiency (04/09/2014); Arthritis; Atypical chest pain (04/09/2014); CD (contact dermatitis) (03/01/2014); Chest pain (04/09/2014); and Lumbar radicular pain (08/22/2015). Surgical: Stephanie [name]  has a past surgical history that includes Back surgery; Appendectomy; Cholecystectomy; Cesarean section; and Bunionectomy. Family: family history includes Asthma in her father; Stroke in her mother.  Constitutional Exam  General appearance: Well nourished, well developed, and well hydrated. In no  apparent acute distress Vitals:   06/23/17 0805  BP: 121/65  Pulse: 72  Resp: 16  Temp: 97.8 F (36.6 C)  TempSrc: Oral  SpO2: 97%  Weight: 217 lb (98.4 kg)  Height: _0  (1.702 m)  Psych/Mental status: Alert, oriented x 3 (person, place, & time)       Eyes: PERLA Respiratory: No evidence of acute respiratory distress  Lumbar Spine Area Exam  Skin & Axial Inspection: Well healed scar from previous spine surgery detected Alignment: Symmetrical Functional ROM: Unrestricted ROM      Stability: No instability detected Muscle Tone/Strength: Functionally intact. No obvious neuro-muscular anomalies detected. Sensory (Neurological): Unimpaired Palpation: Complains of area being tender to palpation       Provocative Tests: Lumbar Hyperextension and rotation test: Positive bilaterally for facet joint pain. Lumbar Lateral bending test: Positive ipsilateral radicular pain, bilaterally. Positive for bilateral foraminal stenosis. Patrick's Maneuver: evaluation deferred today                    Gait & Posture Assessment  Ambulation: Unassisted Gait: Relatively normal for age and body habitus Posture: WNL   Lower Extremity Exam    Side: Right lower extremity  Side: Left lower extremity  Skin & Extremity Inspection: Skin color, temperature, and hair growth are WNL. No peripheral edema or cyanosis. No masses, redness, swelling, asymmetry, or associated skin lesions. No contractures.  Skin & Extremity Inspection: Skin color, temperature, and hair growth are WNL. No peripheral edema or cyanosis. No masses, redness, swelling, asymmetry, or associated skin lesions. No contractures.  Functional ROM: Unrestricted ROM          Functional ROM: Unrestricted ROM  Muscle Tone/Strength: Functionally intact. No obvious neuro-muscular anomalies detected.  Muscle Tone/Strength: Functionally intact. No obvious neuro-muscular anomalies detected.  Sensory (Neurological): Unimpaired  Sensory  (Neurological): Unimpaired  Palpation: No palpable anomalies  Palpation: No palpable anomalies   Assessment  Primary Diagnosis & Pertinent Problem List: The primary encounter diagnosis was Lumbar spondylosis. Diagnoses of Lumbar facet syndrome (Location of Primary Source of Pain) (Bilateral) (L>R), Chronic lower extremity pain (Location of Secondary source of pain) (Bilateral) (L>R), Neurogenic pain, Musculoskeletal pain, Spasm of paraspinal muscle, Spondylolisthesis of lumbar region, Chronic pain syndrome, Long term current use of opiate analgesic, and Other chronic pain were also pertinent to this visit.  Status Diagnosis  Controlled Controlled Controlled 1. Lumbar spondylosis   2. Lumbar facet syndrome (Location of Primary Source of Pain) (Bilateral) (L>R)   3. Chronic lower extremity pain (Location of Secondary source of pain) (Bilateral) (L>R)   4. Neurogenic pain   5. Musculoskeletal pain   6. Spasm of paraspinal muscle   7. Spondylolisthesis of lumbar region   8. Chronic pain syndrome   9. Long term current use of opiate analgesic   10. Other chronic pain     Problems updated and reviewed during this visit: No problems updated. Plan of Care  Pharmacotherapy (Medications Ordered): Meds ordered this encounter  Medications  . gabapentin (NEURONTIN) 300 MG capsule    Sig: Take 1-3 capsules (300-900 mg total) by mouth 4 (four) times daily.    Dispense:  360 capsule    Refill:  2    Do not place this medication, or any other prescription from our practice, on "Automatic Refill". Patient may have prescription filled one day early if pharmacy is closed on scheduled refill date.    Order Specific Question:   Supervising Provider    Answer:   Milinda Pointer [phone]  . cyclobenzaprine (FLEXERIL) 10 MG tablet    Sig: Take 1 tablet (10 mg total) by mouth daily.    Dispense:  90 tablet    Refill:  0    Do not place this medication, or any other prescription from our practice, on  "Automatic Refill". Patient may have prescription filled one day early if pharmacy is closed on scheduled refill date.    Order Specific Question:   Supervising Provider    Answer:   Milinda Pointer [phone]  . meloxicam (MOBIC) 15 MG tablet    Sig: Take 1 tablet (15 mg total) by mouth every morning.    Dispense:  90 tablet    Refill:  0    Do not place this medication, or any other prescription from our practice, on "Automatic Refill". Patient may have prescription filled one day early if pharmacy is closed on scheduled refill date.    Order Specific Question:   Supervising Provider    Answer:   Milinda Pointer [phone]  . HYDROcodone-acetaminophen (NORCO) 10-325 MG tablet    Sig: Take 1 tablet by mouth every 12 (twelve) hours as needed for severe pain.    Dispense:  60 tablet    Refill:  0    Do not place this medication, or any other prescription from our practice, on "Automatic Refill". Patient may have prescription filled one day early if pharmacy is closed on scheduled refill date. Do not fill until: 06/23/2017 To last until: 07/23/2017  . HYDROcodone-acetaminophen (NORCO) 10-325 MG tablet    Sig: Take 1 tablet by mouth every 12 (twelve) hours as needed for severe pain.    Dispense:  45 tablet  Refill:  0    Do not place this medication, or any other prescription from our practice, on "Automatic Refill". Patient may have prescription filled one day early if pharmacy is closed on scheduled refill date. Do not fill until: 07/23/2017 To last until: 08/22/2017  . HYDROcodone-acetaminophen (NORCO) 10-325 MG tablet    Sig: Take 1 tablet by mouth every 12 (twelve) hours as needed for severe pain.    Dispense:  45 tablet    Refill:  0    Do not place this medication, or any other prescription from our practice, on "Automatic Refill". Patient may have prescription filled one day early if pharmacy is closed on scheduled refill date. Do not fill until:08/22/2017 To last until:  09/21/2017   New Prescriptions   No medications on file   Medications administered today: Stephanie [name] had no medications administered during this visit. Lab-work, procedure(s), and/or referral(s): Orders Placed This Encounter  Procedures  . ToxASSURE Select 13 (MW), Urine   Imaging and/or referral(s): None  Interventional therapies: Planned, scheduled, and/or pending:   Follow-up with primary care provider for depression screening Oxycodone switch back to Norco secondary to headaches   Considering:   Diagnostic bilateral lumbar facet block Possible bilateral lumbar facet RFA Diagnostic Caudal ESI Diagnostic Bilateral L4-5 & L5-S1Lumbar TFESI Diagnostic bilateral sacroiliac joint block Possible bilateral sacroiliac joint RFA Diagnostic bilateral hip joint injection Possible bilateral hip joint RFA Diagnostic bilateral knee joint injection Possible series of Hyalgan knee injections Diagnostic bilateral genicular nerve block Possible bilateral genicular nerve RFA.   Palliative PRN treatment(s):   Palliative Caudal ESI   Provider-requested follow-up: Return in about 3 months (around 09/23/2017) for MedMgmt.  Future Appointments Date Time Provider [place]  09/20/2017 10:30 AM Vevelyn Francois, NP [facility] None   Primary Care Physician: [facility] Location: [facility] Note by: Vevelyn Francois NP Date: 06/23/2017; Time: 12:32 PM  Pain Score Disclaimer: We use the NRS-11 scale. This is a self-reported, subjective measurement of pain severity with only modest accuracy. It is used primarily to identify changes within a particular patient. It must be understood that outpatient pain scales are significantly less accurate that those used for research, where they can be applied under ideal controlled circumstances with minimal exposure to variables. In reality, the score is likely to be a combination of pain intensity and pain  affect, where pain affect describes the degree of emotional arousal or changes in action readiness caused by the sensory experience of pain. Factors such as social and work situation, setting, emotional state, anxiety levels, expectation, and prior pain experience may influence pain perception and show large inter-individual differences that may also be affected by time variables.  Patient instructions provided during this appointment: Patient Instructions    ____________________________________________________________________________________________  Medication Rules  Applies to: All patients receiving prescriptions (written or electronic).  Pharmacy of record: Pharmacy where electronic prescriptions will be sent. If written prescriptions are taken to a different pharmacy, please inform the nursing staff. The pharmacy listed in the electronic medical record should be the one where you would like electronic prescriptions to be sent.  Prescription refills: Only during scheduled appointments. Applies to both, written and electronic prescriptions.  NOTE: The following applies primarily to controlled substances (Opioid* Pain Medications).   Patient's responsibilities: 1. Pain Pills: Bring all pain pills to every appointment (except for procedure appointments). 2. Pill Bottles: Bring pills in original pharmacy bottle. Always bring newest bottle. Bring bottle, even if  empty. 3. Medication refills: You are responsible for knowing and keeping track of what medications you need refilled. The day before your appointment, write a list of all prescriptions that need to be refilled. Bring that list to your appointment and give it to the admitting nurse. Prescriptions will be written only during appointments. If you forget a medication, it will not be "Called in", "Faxed", or "electronically sent". You will need to get another appointment to get these prescribed. 4. Prescription Accuracy: You are responsible  for carefully inspecting your prescriptions before leaving our office. Have the discharge nurse carefully go over each prescription with you, before taking them home. Make sure that your name is accurately spelled, that your address is correct. Check the name and dose of your medication to make sure it is accurate. Check the number of pills, and the written instructions to make sure they are clear and accurate. Make sure that you are given enough medication to last until your next medication refill appointment. 5. Taking Medication: Take medication as prescribed. Never take more pills than instructed. Never take medication more frequently than prescribed. Taking less pills or less frequently is permitted and encouraged, when it comes to controlled substances (written prescriptions).  6. Inform other Doctors: Always inform, all of your healthcare providers, of all the medications you take. 7. Pain Medication from other Providers: You are not allowed to accept any additional pain medication from any other Doctor or Healthcare provider. There are two exceptions to this rule. (see below) In the event that you require additional pain medication, you are responsible for notifying us, as stated below. 8. Medication Agreement: You are responsible for carefully reading and following our Medication Agreement. This must be signed before receiving any prescriptions from our practice. Safely store a copy of your signed Agreement. Violations to the Agreement will result in no further prescriptions. (Additional copies of our Medication Agreement are available upon request.) 9. Laws, Rules, & Regulations: All patients are expected to follow all Federal and [employer], Rules, [employer]. Ignorance of the Laws does not constitute a valid excuse. The use of any illegal substances is prohibited. 10. Adopted CDC guidelines & recommendations: Target dosing levels will be at or below 60 MME/day. Use of benzodiazepines**  is not recommended.  Exceptions: There are only two exceptions to the rule of not receiving pain medications from other Healthcare Providers. 1. Exception #1 (Emergencies): In the event of an emergency (i.e.: accident requiring emergency care), you are allowed to receive additional pain medication. However, you are responsible for: As soon as you are able, call our office [phone], at any time of the day or night, and leave a message stating your name, the date and nature of the emergency, and the name and dose of the medication prescribed. In the event that your call is answered by a member of our staff, make sure to document and save the date, time, and the name of the person that took your information.  2. Exception #2 (Planned Surgery): In the event that you are scheduled by another doctor or dentist to have any type of surgery or procedure, you are allowed (for a period no longer than 30 days), to receive additional pain medication, for the acute post-op pain. However, in this case, you are responsible for picking up a copy of our "Post-op Pain Management for Surgeons" handout, and giving it to your surgeon or dentist. This document is available at our office, and does not require an  appointment to obtain it. Simply go to our office during business hours (Monday-Thursday from 8:00 AM to 4:00 PM) (Friday 8:00 AM to 12:00 Noon) or if you have a scheduled appointment with [place], prior to your surgery, and ask for it by name. In addition, you will need to provide [place] with your name, name of your surgeon, type of surgery, and date of procedure or surgery.  *Opioid medications include: morphine, codeine, oxycodone, oxymorphone, hydrocodone, hydromorphone, meperidine, tramadol, tapentadol, buprenorphine, fentanyl, methadone. **Benzodiazepine medications include: diazepam (Valium), alprazolam (Xanax), clonazepam (Klonopine), lorazepam (Ativan), clorazepate (Tranxene), chlordiazepoxide (Librium), estazolam  (Prosom), oxazepam (Serax), temazepam (Restoril), triazolam (Halcion)  ____________________________________________________________________________________________  BMI Assessment: Estimated body mass index is 33.99 kg/m as calculated from the following:   Height as of this encounter: _0  (1.702 m).   Weight as of this encounter: 217 lb (98.4 kg).  BMI interpretation table: BMI level Category Range association with higher incidence of chronic pain  <18 kg/m2 Underweight   18.5-24.9 kg/m2 Ideal body weight   25-29.9 kg/m2 Overweight Increased incidence by 20%  30-34.9 kg/m2 Obese (Class I) Increased incidence by 68%  35-39.9 kg/m2 Severe obesity (Class II) Increased incidence by 136%  >40 kg/m2 Extreme obesity (Class III) Increased incidence by 254%   BMI Readings from Last 4 Encounters:  06/23/17 33.99 kg/m  03/23/17 33.99 kg/m  02/01/17 33.20 kg/m  12/24/16 33.52 kg/m   Wt Readings from Last 4 Encounters:  06/23/17 217 lb (98.4 kg)  03/23/17 217 lb (98.4 kg)  02/01/17 212 lb (96.2 kg)  12/24/16 214 lb (97.1 kg)

## 2017-06-23 ENCOUNTER — Encounter: Payer: Self-pay | Admitting: Nurse Practitioner

## 2017-06-23 ENCOUNTER — Other Ambulatory Visit
Admission: RE | Admit: 2017-06-23 | Discharge: 2017-06-23 | Disposition: A | Discharge status: Home / Self Care | Payer: Medicare Other | Source: Ambulatory Visit | Attending: Nurse Practitioner | Admitting: Nurse Practitioner

## 2017-06-23 ENCOUNTER — Ambulatory Visit: Payer: Worker's Compensation | Attending: Nurse Practitioner | Admitting: Nurse Practitioner

## 2017-06-23 VITALS — BP 121/65 | HR 72 | Temp 97.8°F | Resp 16 | Ht 67.0 in | Wt 217.0 lb

## 2017-06-23 DIAGNOSIS — M79605 Pain in left leg: Secondary | ICD-10-CM | POA: Insufficient documentation

## 2017-06-23 DIAGNOSIS — G4730 Sleep apnea, unspecified: Secondary | ICD-10-CM | POA: Insufficient documentation

## 2017-06-23 DIAGNOSIS — Z825 Family history of asthma and other chronic lower respiratory diseases: Secondary | ICD-10-CM | POA: Insufficient documentation

## 2017-06-23 DIAGNOSIS — E559 Vitamin D deficiency, unspecified: Secondary | ICD-10-CM | POA: Diagnosis not present

## 2017-06-23 DIAGNOSIS — M170 Bilateral primary osteoarthritis of knee: Secondary | ICD-10-CM | POA: Insufficient documentation

## 2017-06-23 DIAGNOSIS — M79604 Pain in right leg: Secondary | ICD-10-CM | POA: Diagnosis not present

## 2017-06-23 DIAGNOSIS — M62830 Muscle spasm of back: Secondary | ICD-10-CM | POA: Diagnosis not present

## 2017-06-23 DIAGNOSIS — M48061 Spinal stenosis, lumbar region without neurogenic claudication: Secondary | ICD-10-CM | POA: Diagnosis not present

## 2017-06-23 DIAGNOSIS — M47816 Spondylosis without myelopathy or radiculopathy, lumbar region: Secondary | ICD-10-CM

## 2017-06-23 DIAGNOSIS — M4316 Spondylolisthesis, lumbar region: Secondary | ICD-10-CM | POA: Insufficient documentation

## 2017-06-23 DIAGNOSIS — E538 Deficiency of other specified B group vitamins: Secondary | ICD-10-CM | POA: Insufficient documentation

## 2017-06-23 DIAGNOSIS — Z9889 Other specified postprocedural states: Secondary | ICD-10-CM | POA: Diagnosis not present

## 2017-06-23 DIAGNOSIS — G894 Chronic pain syndrome: Secondary | ICD-10-CM | POA: Insufficient documentation

## 2017-06-23 DIAGNOSIS — M4696 Unspecified inflammatory spondylopathy, lumbar region: Secondary | ICD-10-CM | POA: Insufficient documentation

## 2017-06-23 DIAGNOSIS — D509 Iron deficiency anemia, unspecified: Secondary | ICD-10-CM | POA: Insufficient documentation

## 2017-06-23 DIAGNOSIS — Z823 Family history of stroke: Secondary | ICD-10-CM | POA: Insufficient documentation

## 2017-06-23 DIAGNOSIS — Z9049 Acquired absence of other specified parts of digestive tract: Secondary | ICD-10-CM | POA: Insufficient documentation

## 2017-06-23 DIAGNOSIS — Z79891 Long term (current) use of opiate analgesic: Secondary | ICD-10-CM | POA: Insufficient documentation

## 2017-06-23 DIAGNOSIS — M792 Neuralgia and neuritis, unspecified: Secondary | ICD-10-CM | POA: Diagnosis not present

## 2017-06-23 DIAGNOSIS — M791 Myalgia: Secondary | ICD-10-CM | POA: Diagnosis not present

## 2017-06-23 DIAGNOSIS — M160 Bilateral primary osteoarthritis of hip: Secondary | ICD-10-CM | POA: Diagnosis not present

## 2017-06-23 DIAGNOSIS — G8929 Other chronic pain: Secondary | ICD-10-CM | POA: Diagnosis present

## 2017-06-23 DIAGNOSIS — Z888 Allergy status to other drugs, medicaments and biological substances status: Secondary | ICD-10-CM | POA: Diagnosis not present

## 2017-06-23 DIAGNOSIS — L259 Unspecified contact dermatitis, unspecified cause: Secondary | ICD-10-CM | POA: Insufficient documentation

## 2017-06-23 DIAGNOSIS — M7918 Myalgia, other site: Secondary | ICD-10-CM

## 2017-06-23 DIAGNOSIS — R0789 Other chest pain: Secondary | ICD-10-CM | POA: Insufficient documentation

## 2017-06-23 LAB — MAGNESIUM: MAGNESIUM: 2 mg/dL (ref 1.7–2.4)

## 2017-06-23 LAB — COMPREHENSIVE METABOLIC PANEL
ALK PHOS: 60 U/L (ref 38–126)
ALT: 13 U/L — ABNORMAL LOW (ref 14–54)
ANION GAP: 8 (ref 5–15)
AST: 17 U/L (ref 15–41)
Albumin: 4.2 g/dL (ref 3.5–5.0)
BUN: 8 mg/dL (ref 6–20)
CALCIUM: 9.4 mg/dL (ref 8.9–10.3)
CHLORIDE: 107 mmol/L (ref 101–111)
CO2: 27 mmol/L (ref 22–32)
Creatinine, Ser: 0.59 mg/dL (ref 0.44–1.00)
GFR calc non Af Amer: >60 mL/min (ref >60)
Glucose, Bld: 100 mg/dL — ABNORMAL HIGH (ref 65–99)
POTASSIUM: 4 mmol/L (ref 3.5–5.1)
SODIUM: 142 mmol/L (ref 135–145)
Total Bilirubin: 0.5 mg/dL (ref 0.3–1.2)
Total Protein: 7.6 g/dL (ref 6.5–8.1)

## 2017-06-23 LAB — VITAMIN B12: Vitamin B-12: 271 pg/mL (ref 180–914)

## 2017-06-23 MED ORDER — CYCLOBENZAPRINE HCL 10 MG PO TABS: 10.0000 mg | ORAL_TABLET | Freq: Every day | ORAL | 0 refills | Status: DC

## 2017-06-23 MED ORDER — MELOXICAM 15 MG PO TABS: 15.0000 mg | ORAL_TABLET | Freq: Every morning | ORAL | 0 refills | Status: DC

## 2017-06-23 MED ORDER — HYDROCODONE-ACETAMINOPHEN 10-325 MG PO TABS: 1.0000 | ORAL_TABLET | Freq: Two times a day (BID) | ORAL | 0 refills | Status: DC | PRN

## 2017-06-23 MED ORDER — GABAPENTIN 300 MG PO CAPS: 300.0000 mg | ORAL_CAPSULE | Freq: Four times a day (QID) | ORAL | 2 refills | Status: DC

## 2017-06-23 NOTE — Patient Instructions (Addendum)
____________________________________________________________________________________________  Medication Rules  Applies to: All patients receiving prescriptions (written or electronic).  Pharmacy of record: Pharmacy where electronic prescriptions will be sent. If written prescriptions are taken to a different pharmacy, please inform the nursing staff. The pharmacy listed in the electronic medical record should be the one where you would like electronic prescriptions to be sent.  Prescription refills: Only during scheduled appointments. Applies to both, written and electronic prescriptions.  NOTE: The following applies primarily to controlled substances (Opioid* Pain Medications).   Patient's responsibilities: 1. Pain Pills: Bring all pain pills to every appointment (except for procedure appointments). 2. Pill Bottles: Bring pills in original pharmacy bottle. Always bring newest bottle. Bring bottle, even if empty. 3. Medication refills: You are responsible for knowing and keeping track of what medications you need refilled. The day before your appointment, write a list of all prescriptions that need to be refilled. Bring that list to your appointment and give it to the admitting nurse. Prescriptions will be written only during appointments. If you forget a medication, it will not be "Called in", "Faxed", or "electronically sent". You will need to get another appointment to get these prescribed. 4. Prescription Accuracy: You are responsible for carefully inspecting your prescriptions before leaving our office. Have the discharge nurse carefully go over each prescription with you, before taking them home. Make sure that your name is accurately spelled, that your address is correct. Check the name and dose of your medication to make sure it is accurate. Check the number of pills, and the written instructions to make sure they are clear and accurate. Make sure that you are given enough medication to  last until your next medication refill appointment. 5. Taking Medication: Take medication as prescribed. Never take more pills than instructed. Never take medication more frequently than prescribed. Taking less pills or less frequently is permitted and encouraged, when it comes to controlled substances (written prescriptions).  6. Inform other Doctors: Always inform, all of your healthcare providers, of all the medications you take. 7. Pain Medication from other Providers: You are not allowed to accept any additional pain medication from any other Doctor or Healthcare provider. There are two exceptions to this rule. (see below) In the event that you require additional pain medication, you are responsible for notifying us, as stated below. 8. Medication Agreement: You are responsible for carefully reading and following our Medication Agreement. This must be signed before receiving any prescriptions from our practice. Safely store a copy of your signed Agreement. Violations to the Agreement will result in no further prescriptions. (Additional copies of our Medication Agreement are available upon request.) 9. Laws, Rules, & Regulations: All patients are expected to follow all Federal and State Laws, Statutes, Rules, & Regulations. Ignorance of the Laws does not constitute a valid excuse. The use of any illegal substances is prohibited. 10. Adopted CDC guidelines & recommendations: Target dosing levels will be at or below 60 MME/day. Use of benzodiazepines** is not recommended.  Exceptions: There are only two exceptions to the rule of not receiving pain medications from other Healthcare Providers. 1. Exception #1 (Emergencies): In the event of an emergency (i.e.: accident requiring emergency care), you are allowed to receive additional pain medication. However, you are responsible for: As soon as you are able, call our office [phone], at any time of the day or night, and leave a message stating your  name, the date and nature of the emergency, and the name and dose of the medication   prescribed. In the event that your call is answered by a member of our staff, make sure to document and save the date, time, and the name of the person that took your information.  2. Exception #2 (Planned Surgery): In the event that you are scheduled by another doctor or dentist to have any type of surgery or procedure, you are allowed (for a period no longer than 30 days), to receive additional pain medication, for the acute post-op pain. However, in this case, you are responsible for picking up a copy of our "Post-op Pain Management for Surgeons" handout, and giving it to your surgeon or dentist. This document is available at our office, and does not require an appointment to obtain it. Simply go to our office during business hours (Monday-Thursday from 8:00 AM to 4:00 PM) (Friday 8:00 AM to 12:00 Noon) or if you have a scheduled appointment with [place], prior to your surgery, and ask for it by name. In addition, you will need to provide [place] with your name, name of your surgeon, type of surgery, and date of procedure or surgery.  *Opioid medications include: morphine, codeine, oxycodone, oxymorphone, hydrocodone, hydromorphone, meperidine, tramadol, tapentadol, buprenorphine, fentanyl, methadone. **Benzodiazepine medications include: diazepam (Valium), alprazolam (Xanax), clonazepam (Klonopine), lorazepam (Ativan), clorazepate (Tranxene), chlordiazepoxide (Librium), estazolam (Prosom), oxazepam (Serax), temazepam (Restoril), triazolam (Halcion)  ____________________________________________________________________________________________  BMI Assessment: Estimated body mass index is 33.99 kg/m as calculated from the following:   Height as of this encounter: 5\' 7"  (1.702 m).   Weight as of this encounter: 217 lb (98.4 kg).  BMI interpretation table: BMI level Category Range association with higher incidence of chronic pain   <18 kg/m2 Underweight   18.5-24.9 kg/m2 Ideal body weight   25-29.9 kg/m2 Overweight Increased incidence by 20%  30-34.9 kg/m2 Obese (Class I) Increased incidence by 68%  35-39.9 kg/m2 Severe obesity (Class II) Increased incidence by 136%  >40 kg/m2 Extreme obesity (Class III) Increased incidence by 254%   BMI Readings from Last 4 Encounters:  06/23/17 33.99 kg/m  03/23/17 33.99 kg/m  02/01/17 33.20 kg/m  12/24/16 33.52 kg/m   Wt Readings from Last 4 Encounters:  06/23/17 217 lb (98.4 kg)  03/23/17 217 lb (98.4 kg)  02/01/17 212 lb (96.2 kg)  12/24/16 214 lb (97.1 kg)

## 2017-06-23 NOTE — Progress Notes (Signed)
Nursing Pain Medication Assessment:  Safety precautions to be maintained throughout the outpatient stay will include: orient to surroundings, keep bed in low position, maintain call bell within reach at all times, provide assistance with transfer out of bed and ambulation.  Medication Inspection Compliance: Pill count conducted under aseptic conditions, in front of the patient. Neither the pills nor the bottle was removed from the patient's sight at any time. Once count was completed pills were immediately returned to the patient in their original bottle.  Medication: Oxycodone IR Pill/Patch Count: 11 of 60 pills remain Pill/Patch Appearance: Markings consistent with prescribed medication Bottle Appearance: Standard pharmacy container. Clearly labeled. Filled Date: 07 / 26 / 2018 Last Medication intake:  Today

## 2017-06-25 ENCOUNTER — Other Ambulatory Visit: Payer: Self-pay | Admitting: Pain Medicine

## 2017-06-25 DIAGNOSIS — M62830 Muscle spasm of back: Secondary | ICD-10-CM

## 2017-06-25 DIAGNOSIS — M7918 Myalgia, other site: Secondary | ICD-10-CM

## 2017-06-29 LAB — TOXASSURE SELECT 13 (MW), URINE

## 2017-07-06 LAB — DRUG SCREEN 10 W/CONF, SERUM
Amphetamines, IA: NEGATIVE ng/mL
BENZODIAZEPINES, IA: NEGATIVE ng/mL
Barbiturates, IA: NEGATIVE ug/mL
COCAINE & METABOLITE, IA: NEGATIVE ng/mL
Methadone, IA: NEGATIVE ng/mL
Opiates, IA: NEGATIVE ng/mL
Oxycodones, IA: POSITIVE ng/mL
PHENCYCLIDINE, IA: NEGATIVE ng/mL
Propoxyphene, IA: NEGATIVE ng/mL
THC(MARIJUANA) METABOLITE, IA: NEGATIVE ng/mL

## 2017-07-06 LAB — OXYCODONES,MS,WB/SP RFX
OXYCOCONE: 10.5 ng/mL
OXYCODONES CONFIRMATION: POSITIVE
OXYMORPHONE: NEGATIVE ng/mL

## 2017-07-17 ENCOUNTER — Other Ambulatory Visit: Payer: Self-pay | Admitting: Pain Medicine

## 2017-07-17 DIAGNOSIS — G4701 Insomnia due to medical condition: Secondary | ICD-10-CM

## 2017-07-17 DIAGNOSIS — G8929 Other chronic pain: Principal | ICD-10-CM

## 2017-08-17 DIAGNOSIS — Z9049 Acquired absence of other specified parts of digestive tract: Secondary | ICD-10-CM | POA: Diagnosis not present

## 2017-08-17 DIAGNOSIS — K449 Diaphragmatic hernia without obstruction or gangrene: Secondary | ICD-10-CM | POA: Diagnosis not present

## 2017-08-17 DIAGNOSIS — G8929 Other chronic pain: Secondary | ICD-10-CM | POA: Diagnosis not present

## 2017-08-17 DIAGNOSIS — R109 Unspecified abdominal pain: Secondary | ICD-10-CM | POA: Diagnosis not present

## 2017-08-17 DIAGNOSIS — R1013 Epigastric pain: Secondary | ICD-10-CM | POA: Diagnosis not present

## 2017-08-17 DIAGNOSIS — D734 Cyst of spleen: Secondary | ICD-10-CM | POA: Diagnosis not present

## 2017-09-20 ENCOUNTER — Encounter: Payer: Self-pay | Admitting: Nurse Practitioner

## 2017-09-20 ENCOUNTER — Other Ambulatory Visit: Payer: Self-pay

## 2017-09-20 ENCOUNTER — Ambulatory Visit: Payer: Worker's Compensation | Attending: Nurse Practitioner | Admitting: Nurse Practitioner

## 2017-09-20 VITALS — BP 133/77 | HR 83 | Temp 97.8°F | Ht 67.0 in | Wt 212.0 lb

## 2017-09-20 DIAGNOSIS — M5442 Lumbago with sciatica, left side: Secondary | ICD-10-CM | POA: Diagnosis not present

## 2017-09-20 DIAGNOSIS — M25551 Pain in right hip: Secondary | ICD-10-CM | POA: Diagnosis not present

## 2017-09-20 DIAGNOSIS — M961 Postlaminectomy syndrome, not elsewhere classified: Secondary | ICD-10-CM | POA: Insufficient documentation

## 2017-09-20 DIAGNOSIS — M62830 Muscle spasm of back: Secondary | ICD-10-CM | POA: Insufficient documentation

## 2017-09-20 DIAGNOSIS — G4701 Insomnia due to medical condition: Secondary | ICD-10-CM | POA: Diagnosis not present

## 2017-09-20 DIAGNOSIS — Z9049 Acquired absence of other specified parts of digestive tract: Secondary | ICD-10-CM | POA: Insufficient documentation

## 2017-09-20 DIAGNOSIS — G894 Chronic pain syndrome: Secondary | ICD-10-CM | POA: Insufficient documentation

## 2017-09-20 DIAGNOSIS — D509 Iron deficiency anemia, unspecified: Secondary | ICD-10-CM | POA: Diagnosis not present

## 2017-09-20 DIAGNOSIS — M25552 Pain in left hip: Secondary | ICD-10-CM | POA: Diagnosis not present

## 2017-09-20 DIAGNOSIS — M47818 Spondylosis without myelopathy or radiculopathy, sacral and sacrococcygeal region: Secondary | ICD-10-CM | POA: Insufficient documentation

## 2017-09-20 DIAGNOSIS — Z981 Arthrodesis status: Secondary | ICD-10-CM | POA: Diagnosis not present

## 2017-09-20 DIAGNOSIS — M23206 Derangement of unspecified meniscus due to old tear or injury, right knee: Secondary | ICD-10-CM | POA: Insufficient documentation

## 2017-09-20 DIAGNOSIS — M170 Bilateral primary osteoarthritis of knee: Secondary | ICD-10-CM | POA: Insufficient documentation

## 2017-09-20 DIAGNOSIS — M5441 Lumbago with sciatica, right side: Secondary | ICD-10-CM | POA: Diagnosis not present

## 2017-09-20 DIAGNOSIS — M4316 Spondylolisthesis, lumbar region: Secondary | ICD-10-CM | POA: Diagnosis not present

## 2017-09-20 DIAGNOSIS — M160 Bilateral primary osteoarthritis of hip: Secondary | ICD-10-CM | POA: Insufficient documentation

## 2017-09-20 DIAGNOSIS — M545 Low back pain: Secondary | ICD-10-CM | POA: Insufficient documentation

## 2017-09-20 DIAGNOSIS — M4726 Other spondylosis with radiculopathy, lumbar region: Secondary | ICD-10-CM | POA: Insufficient documentation

## 2017-09-20 DIAGNOSIS — G8929 Other chronic pain: Secondary | ICD-10-CM

## 2017-09-20 DIAGNOSIS — Z823 Family history of stroke: Secondary | ICD-10-CM | POA: Insufficient documentation

## 2017-09-20 DIAGNOSIS — Z825 Family history of asthma and other chronic lower respiratory diseases: Secondary | ICD-10-CM | POA: Diagnosis not present

## 2017-09-20 DIAGNOSIS — M488X6 Other specified spondylopathies, lumbar region: Secondary | ICD-10-CM | POA: Diagnosis not present

## 2017-09-20 DIAGNOSIS — Z79899 Other long term (current) drug therapy: Secondary | ICD-10-CM | POA: Insufficient documentation

## 2017-09-20 DIAGNOSIS — M792 Neuralgia and neuritis, unspecified: Secondary | ICD-10-CM | POA: Diagnosis not present

## 2017-09-20 DIAGNOSIS — M7918 Myalgia, other site: Secondary | ICD-10-CM | POA: Diagnosis not present

## 2017-09-20 DIAGNOSIS — G9619 Other disorders of meninges, not elsewhere classified: Secondary | ICD-10-CM | POA: Insufficient documentation

## 2017-09-20 DIAGNOSIS — E559 Vitamin D deficiency, unspecified: Secondary | ICD-10-CM | POA: Diagnosis not present

## 2017-09-20 DIAGNOSIS — Z888 Allergy status to other drugs, medicaments and biological substances status: Secondary | ICD-10-CM | POA: Insufficient documentation

## 2017-09-20 DIAGNOSIS — G4730 Sleep apnea, unspecified: Secondary | ICD-10-CM | POA: Insufficient documentation

## 2017-09-20 DIAGNOSIS — Z5181 Encounter for therapeutic drug level monitoring: Secondary | ICD-10-CM | POA: Diagnosis not present

## 2017-09-20 DIAGNOSIS — M48061 Spinal stenosis, lumbar region without neurogenic claudication: Secondary | ICD-10-CM | POA: Insufficient documentation

## 2017-09-20 MED ORDER — MELOXICAM 15 MG PO TABS: 15.0000 mg | ORAL_TABLET | Freq: Every morning | ORAL | 0 refills | Status: DC

## 2017-09-20 MED ORDER — HYDROCODONE-ACETAMINOPHEN 10-325 MG PO TABS: 1.0000 | ORAL_TABLET | Freq: Two times a day (BID) | ORAL | 0 refills | Status: DC | PRN

## 2017-09-20 MED ORDER — GABAPENTIN 300 MG PO CAPS: 300.0000 mg | ORAL_CAPSULE | Freq: Four times a day (QID) | ORAL | 2 refills | Status: DC

## 2017-09-20 MED ORDER — CYCLOBENZAPRINE HCL 10 MG PO TABS: 10.0000 mg | ORAL_TABLET | Freq: Every day | ORAL | 0 refills | Status: DC

## 2017-09-20 NOTE — Patient Instructions (Addendum)
____________________________________________________________________________________________  Medication Rules  Applies to: All patients receiving prescriptions (written or electronic).  Pharmacy of record: Pharmacy where electronic prescriptions will be sent. If written prescriptions are taken to a different pharmacy, please inform the nursing staff. The pharmacy listed in the electronic medical record should be the one where you would like electronic prescriptions to be sent.  Prescription refills: Only during scheduled appointments. Applies to both, written and electronic prescriptions.  NOTE: The following applies primarily to controlled substances (Opioid* Pain Medications).   Patient's responsibilities: 1. Pain Pills: Bring all pain pills to every appointment (except for procedure appointments). 2. Pill Bottles: Bring pills in original pharmacy bottle. Always bring newest bottle. Bring bottle, even if empty. 3. Medication refills: You are responsible for knowing and keeping track of what medications you need refilled. The day before your appointment, write a list of all prescriptions that need to be refilled. Bring that list to your appointment and give it to the admitting nurse. Prescriptions will be written only during appointments. If you forget a medication, it will not be "Called in", "Faxed", or "electronically sent". You will need to get another appointment to get these prescribed. 4. Prescription Accuracy: You are responsible for carefully inspecting your prescriptions before leaving our office. Have the discharge nurse carefully go over each prescription with you, before taking them home. Make sure that your name is accurately spelled, that your address is correct. Check the name and dose of your medication to make sure it is accurate. Check the number of pills, and the written instructions to make sure they are clear and accurate. Make sure that you are given enough medication to  last until your next medication refill appointment. 5. Taking Medication: Take medication as prescribed. Never take more pills than instructed. Never take medication more frequently than prescribed. Taking less pills or less frequently is permitted and encouraged, when it comes to controlled substances (written prescriptions).  6. Inform other Doctors: Always inform, all of your healthcare providers, of all the medications you take. 7. Pain Medication from other Providers: You are not allowed to accept any additional pain medication from any other Doctor or Healthcare provider. There are two exceptions to this rule. (see below) In the event that you require additional pain medication, you are responsible for notifying us, as stated below. 8. Medication Agreement: You are responsible for carefully reading and following our Medication Agreement. This must be signed before receiving any prescriptions from our practice. Safely store a copy of your signed Agreement. Violations to the Agreement will result in no further prescriptions. (Additional copies of our Medication Agreement are available upon request.) 9. Laws, Rules, & Regulations: All patients are expected to follow all Federal and State Laws, Statutes, Rules, & Regulations. Ignorance of the Laws does not constitute a valid excuse. The use of any illegal substances is prohibited. 10. Adopted CDC guidelines & recommendations: Target dosing levels will be at or below 60 MME/day. Use of benzodiazepines** is not recommended.  Exceptions: There are only two exceptions to the rule of not receiving pain medications from other Healthcare Providers. 1. Exception #1 (Emergencies): In the event of an emergency (i.e.: accident requiring emergency care), you are allowed to receive additional pain medication. However, you are responsible for: As soon as you are able, call our office [phone], at any time of the day or night, and leave a message stating your  name, the date and nature of the emergency, and the name and dose of the medication   prescribed. In the event that your call is answered by a member of our staff, make sure to document and save the date, time, and the name of the person that took your information.  2. Exception #2 (Planned Surgery): In the event that you are scheduled by another doctor or dentist to have any type of surgery or procedure, you are allowed (for a period no longer than 30 days), to receive additional pain medication, for the acute post-op pain. However, in this case, you are responsible for picking up a copy of our "Post-op Pain Management for Surgeons" handout, and giving it to your surgeon or dentist. This document is available at our office, and does not require an appointment to obtain it. Simply go to our office during business hours (Monday-Thursday from 8:00 AM to 4:00 PM) (Friday 8:00 AM to 12:00 Noon) or if you have a scheduled appointment with [place], prior to your surgery, and ask for it by name. In addition, you will need to provide [place] with your name, name of your surgeon, type of surgery, and date of procedure or surgery.  *Opioid medications include: morphine, codeine, oxycodone, oxymorphone, hydrocodone, hydromorphone, meperidine, tramadol, tapentadol, buprenorphine, fentanyl, methadone. **Benzodiazepine medications include: diazepam (Valium), alprazolam (Xanax), clonazepam (Klonopine), lorazepam (Ativan), clorazepate (Tranxene), chlordiazepoxide (Librium), estazolam (Prosom), oxazepam (Serax), temazepam (Restoril), triazolam (Halcion)  ____________________________________________________________________________________________  Gabapentin capsules or tablets What is this medicine? GABAPENTIN (GA ba pen tin) is used to control partial seizures in adults with epilepsy. It is also used to treat certain types of nerve pain. This medicine may be used for other purposes; ask your health care provider or pharmacist if you  have questions. COMMON BRAND NAME(S): Active-PAC with Gabapentin, Gabarone, Neurontin What should I tell my health care provider before I take this medicine? They need to know if you have any of these conditions: -kidney disease -suicidal thoughts, plans, or attempt; a previous suicide attempt by you or a family member -an unusual or allergic reaction to gabapentin, other medicines, foods, dyes, or preservatives -pregnant or trying to get pregnant -breast-feeding How should I use this medicine? Take this medicine by mouth with a glass of water. Follow the directions on the prescription label. You can take it with or without food. If it upsets your stomach, take it with food.Take your medicine at regular intervals. Do not take it more often than directed. Do not stop taking except on your doctor's advice. If you are directed to break the 600 or 800 mg tablets in half as part of your dose, the extra half tablet should be used for the next dose. If you have not used the extra half tablet within 28 days, it should be thrown away. A special MedGuide will be given to you by the pharmacist with each prescription and refill. Be sure to read this information carefully each time. Talk to your pediatrician regarding the use of this medicine in children. Special care may be needed. Overdosage: If you think you have taken too much of this medicine contact a poison control center or emergency room at once. NOTE: This medicine is only for you. Do not share this medicine with others. What if I miss a dose? If you miss a dose, take it as soon as you can. If it is almost time for your next dose, take only that dose. Do not take double or extra doses. What may interact with this medicine? Do not take this medicine with any of the following medications: -other gabapentin products This  medicine may also interact with the following medications: -alcohol -antacids -antihistamines for allergy, cough and  cold -certain medicines for anxiety or sleep -certain medicines for depression or psychotic disturbances -homatropine; hydrocodone -naproxen -narcotic medicines (opiates) for pain -phenothiazines like chlorpromazine, mesoridazine, prochlorperazine, thioridazine This list may not describe all possible interactions. Give your health care provider a list of all the medicines, herbs, non-prescription drugs, or dietary supplements you use. Also tell them if you smoke, drink alcohol, or use illegal drugs. Some items may interact with your medicine. What should I watch for while using this medicine? Visit your doctor or health care professional for regular checks on your progress. You may want to keep a record at home of how you feel your condition is responding to treatment. You may want to share this information with your doctor or health care professional at each visit. You should contact your doctor or health care professional if your seizures get worse or if you have any new types of seizures. Do not stop taking this medicine or any of your seizure medicines unless instructed by your doctor or health care professional. Stopping your medicine suddenly can increase your seizures or their severity. Wear a medical identification bracelet or chain if you are taking this medicine for seizures, and carry a card that lists all your medications. You may get drowsy, dizzy, or have blurred vision. Do not drive, use machinery, or do anything that needs mental alertness until you know how this medicine affects you. To reduce dizzy or fainting spells, do not sit or stand up quickly, especially if you are an older patient. Alcohol can increase drowsiness and dizziness. Avoid alcoholic drinks. Your mouth may get dry. Chewing sugarless gum or sucking hard candy, and drinking plenty of water will help. The use of this medicine may increase the chance of suicidal thoughts or actions. Pay special attention to how you are  responding while on this medicine. Any worsening of mood, or thoughts of suicide or dying should be reported to your health care professional right away. Women who become pregnant while using this medicine may enroll in the [place] Pregnancy Registry by calling [phone]. This registry collects information about the safety of antiepileptic drug use during pregnancy. What side effects may I notice from receiving this medicine? Side effects that you should report to your doctor or health care professional as soon as possible: -allergic reactions like skin rash, itching or hives, swelling of the face, lips, or tongue -worsening of mood, thoughts or actions of suicide or dying Side effects that usually do not require medical attention (report to your doctor or health care professional if they continue or are bothersome): -constipation -difficulty walking or controlling muscle movements -dizziness -nausea -slurred speech -tiredness -tremors -weight gain This list may not describe all possible side effects. Call your doctor for medical advice about side effects. You may report side effects to FDA at 1-800-FDA-1088. Where should I keep my medicine? Keep out of reach of children. This medicine may cause accidental overdose and death if it taken by other adults, children, or pets. Mix any unused medicine with a substance like cat litter or coffee grounds. Then throw the medicine away in a sealed container like a sealed bag or a coffee can with a lid. Do not use the medicine after the expiration date. Store at room temperature between 15 and 30 degrees C (59 and 86 degrees F). NOTE: This sheet is a summary. It may not cover all possible  information. If you have questions about this medicine, talk to your doctor, pharmacist, or health care provider.  2018 [employer]/Gold Standard (2013-12-15 15:26:50)  BMI Assessment: Estimated body mass index is 33.2 kg/m as calculated  from the following:   Height as of this encounter: 5\' 7"  (1.702 m).   Weight as of this encounter: 212 lb (96.2 kg).  BMI interpretation table: BMI level Category Range association with higher incidence of chronic pain  <18 kg/m2 Underweight   18.5-24.9 kg/m2 Ideal body weight   25-29.9 kg/m2 Overweight Increased incidence by 20%  30-34.9 kg/m2 Obese (Class I) Increased incidence by 68%  35-39.9 kg/m2 Severe obesity (Class II) Increased incidence by 136%  >40 kg/m2 Extreme obesity (Class III) Increased incidence by 254%   BMI Readings from Last 4 Encounters:  09/20/17 33.20 kg/m  06/23/17 33.99 kg/m  03/23/17 33.99 kg/m  02/01/17 33.20 kg/m   Wt Readings from Last 4 Encounters:  09/20/17 212 lb (96.2 kg)  06/23/17 217 lb (98.4 kg)  03/23/17 217 lb (98.4 kg)  02/01/17 212 lb (96.2 kg)

## 2017-09-20 NOTE — Progress Notes (Signed)
Patient's Name: Stephanie [name]  MRN: [id number]  Referring Provider: [facility]  DOB: [birth year]  PCP: [facility]  DOS: 09/20/2017  Note by: Vevelyn Francois NP  Service setting: Ambulatory outpatient  Specialty: Interventional Pain Management  Location: [facility] (AMB) Pain Management Facility    Patient type: Established    Primary Reason(s) for Visit: Encounter for prescription drug management. (Level of risk: moderate)  CC: Back Pain (low )  HPI  [name] is a 56 y.o. year old, female patient, who comes today for a medication management evaluation. 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 Her primarily  concern today is the Back Pain (low )  Pain Assessment: Location: Left, Right(left is worse) Back Radiating: radiates down both legs, left is worse Onset: More than a month ago Duration: Chronic pain Quality: Stabbing, Throbbing, Sharp Severity: 2 /10 (self-reported pain score)  Note: Reported level is compatible with observation.                          Effect on ADL:   Timing: Constant Modifying factors: meds  [name] was last scheduled for an appointment on 06/23/2017 for medication management. During today's appointment we reviewed [name]'s chronic pain status, as well as her outpatient medication regimen. She admits that her pain is stable. She denies any new problems or concerns today.   The patient  reports that she does not use drugs. Her body mass index is 33.2 kg/m.  Further details on both, my assessment(s), as well as the proposed treatment plan, please see below.  Controlled Substance Pharmacotherapy Assessment REMS (Risk Evaluation and Mitigation Strategy)  Analgesic:oxycodone IR 5 mg 1 tablet by mouth twice a day (10 mg/dayof oxycodone)  MME/day:'15mg'$ /day.   Dewayne Shorter, RN  09/20/2017 10:42 AM  Signed Nursing Pain Medication Assessment:  Safety precautions to be maintained throughout the outpatient stay will include: orient to surroundings, keep bed in low position, maintain call bell within reach at all times, provide assistance with transfer out of bed and ambulation.  Medication Inspection Compliance: Pill count conducted under aseptic conditions, in front of the patient. Neither the pills nor the bottle was removed from the patient's sight at any time. Once count was completed pills were immediately returned to the patient in their original bottle.  Medication: Hydrocodone/APAP Pill/Patch Count: 15 of 45 pills remain Pill/Patch Appearance: Markings consistent with prescribed medication Bottle Appearance: Standard  pharmacy container. Clearly  labeled. Filled Date: 69 / 05/ 2018 Last Medication intake:  Today   Pharmacokinetics: Liberation and absorption (onset of action): WNL Distribution (time to peak effect): WNL Metabolism and excretion (duration of action): WNL         Pharmacodynamics: Desired effects: Analgesia: [name] reports >50% benefit. Functional ability: Patient reports that medication allows her to accomplish basic ADLs Clinically meaningful improvement in function (CMIF): Sustained CMIF goals met Perceived effectiveness: Described as relatively effective, allowing for increase in activities of daily living (ADL) Undesirable effects: Side-effects or Adverse reactions: None reported Monitoring: [place] PMP: Online review of the past 42-monthperiod conducted. Compliant with practice rules and regulations Last UDS on record: Summary  Date Value Ref Range Status  06/23/2017 FINAL  Final    Comment:    ==================================================================== TOXASSURE SELECT 13 (MW) ==================================================================== Test                             Result       Flag       Units Drug Present not Declared for Prescription Verification   Oxycodone                      1513         UNEXPECTED ng/mg creat   Oxymorphone                    1010         UNEXPECTED ng/mg creat   Noroxycodone                   2871         UNEXPECTED ng/mg creat   Noroxymorphone                 409          UNEXPECTED ng/mg creat    Sources of oxycodone are scheduled prescription medications.    Oxymorphone, noroxycodone, and noroxymorphone are expected    metabolites of oxycodone. Oxymorphone is also available as a    scheduled prescription medication. Drug Absent but Declared for Prescription Verification   Hydrocodone                    Not Detected UNEXPECTED ng/mg creat ==================================================================== Test                      Result    Flag    Units      Ref Range   Creatinine              68               mg/dL      >=20 ==================================================================== Declared Medications:  The flagging and interpretation on this report are based on the  following declared medications.  Unexpected results may arise from  inaccuracies in the declared medications.  **Note: The testing scope of this panel includes these medications:  Hydrocodone (Norco)  **Note: The testing scope of this panel does not include following  reported medications:  Acetaminophen (Norco)  Cyclobenzaprine (Flexeril)  Gabapentin  Melatonin  Meloxicam (Mobic)  Trazodone  Triamcinolone (Kenalog) ==================================================================== For clinical consultation, please call ([phone] ====================================================================    UDS interpretation: Compliant          Medication Assessment Form: Reviewed. Patient indicates being compliant with therapy Treatment compliance: Compliant Risk Assessment Profile: Aberrant  behavior: See prior evaluations. None observed or detected today Comorbid factors increasing risk of overdose: See prior notes. No additional risks detected today Risk of substance use disorder (SUD): Low Opioid Risk Tool - 09/20/17 1038      Family History of Substance Abuse   Alcohol  Negative    Illegal Drugs  Negative    Rx Drugs  Negative      Personal History of Substance Abuse   Alcohol  Negative    Illegal Drugs  Negative    Rx Drugs  Negative      Age   Age between 73-45 years   No      History of Preadolescent Sexual Abuse   History of Preadolescent Sexual Abuse  Negative or Female      Psychological Disease   Psychological Disease  Negative    Depression  Negative      Total Score   Opioid Risk Tool Scoring  0    Opioid Risk Interpretation  Low Risk      ORT Scoring interpretation table:  Score <3 = Low Risk for SUD  Score  between 4-7 = Moderate Risk for SUD  Score >8 = High Risk for Opioid Abuse   Risk Mitigation Strategies:  Patient Counseling: Covered Patient-Prescriber Agreement (PPA): Present and active  Notification to other healthcare providers: Done  Pharmacologic Plan: No change in therapy, at this time  Laboratory Chemistry  Inflammation Markers (CRP: Acute Phase) (ESR: Chronic Phase) Lab Results  Component Value Date   CRP 0.7 11/18/2015   ESRSEDRATE 27 11/18/2015                 Renal Function Markers Lab Results  Component Value Date   BUN 8 06/23/2017   CREATININE 0.59 06/23/2017   GFRAA >60 06/23/2017   GFRNONAA >60 06/23/2017                 Hepatic Function Markers Lab Results  Component Value Date   AST 17 06/23/2017   ALT 13 (L) 06/23/2017   ALBUMIN 4.2 06/23/2017   ALKPHOS 60 06/23/2017                 Electrolytes Lab Results  Component Value Date   NA 142 06/23/2017   K 4.0 06/23/2017   CL 107 06/23/2017   CALCIUM 9.4 06/23/2017   MG 2.0 06/23/2017                 Neuropathy Markers Lab Results  Component Value Date   VITAMINB12 271 06/23/2017                 Bone Pathology Markers Lab Results  Component Value Date   ALKPHOS 60 06/23/2017   25OHVITD1 34 05/07/2016   25OHVITD2 <1.0 05/07/2016   25OHVITD3 34 05/07/2016   CALCIUM 9.4 06/23/2017                 Rheumatology Markers No results found for: LABURIC, URICUR              Coagulation Parameters Lab Results  Component Value Date   PLT 298 04/10/2014                 Cardiovascular Markers Lab Results  Component Value Date   TROPONINI <0.30 04/10/2014   HGB 8.7 (L) 04/10/2014   HCT 28.6 (L) 04/10/2014                 CA Markers No results found for:  CEA, CA125, LABCA2               Note: Lab results reviewed.  Recent Diagnostic Imaging Results  DG C-Arm 1-60 Min-No Report Fluoroscopy was utilized by the requesting physician.  No radiographic  interpretation.   Complexity  Note: Imaging results reviewed. Results shared with [name], using Layman's terms.                         Meds   Current Outpatient Medications:  .  [START ON 10/06/2017] cyclobenzaprine (FLEXERIL) 10 MG tablet, Take 1 tablet (10 mg total) daily by mouth., Disp: 90 tablet, Rfl: 0 .  [START ON 10/06/2017] gabapentin (NEURONTIN) 300 MG capsule, Take 1-3 capsules (300-900 mg total) 4 (four) times daily by mouth., Disp: 360 capsule, Rfl: 2 .  [START ON 12/05/2017] HYDROcodone-acetaminophen (NORCO) 10-325 MG tablet, Take 1 tablet every 12 (twelve) hours as needed by mouth for severe pain., Disp: 45 tablet, Rfl: 0 .  [START ON 10/06/2017] meloxicam (MOBIC) 15 MG tablet, Take 1 tablet (15 mg total) every morning by mouth., Disp: 90 tablet, Rfl: 0 .  triamcinolone ointment (KENALOG) 0.1 %, Apply topically., Disp: , Rfl:  .  [facility] MELATONIN 10 MG CAPS, Take 10-20 mg by mouth at bedtime. (Patient taking differently: Take 10-20 mg by mouth at bedtime as needed. ), Disp: 60 capsule, Rfl: 2 .  [START ON 11/05/2017] HYDROcodone-acetaminophen (NORCO) 10-325 MG tablet, Take 1 tablet every 12 (twelve) hours as needed by mouth for severe pain., Disp: 45 tablet, Rfl: 0 .  [START ON 10/06/2017] HYDROcodone-acetaminophen (NORCO) 10-325 MG tablet, Take 1 tablet every 12 (twelve) hours as needed by mouth for severe pain., Disp: 60 tablet, Rfl: 0 .  traZODone (DESYREL) 100 MG tablet, Take 0.5 tablets (50 mg total) by mouth at bedtime as needed for sleep., Disp: 45 tablet, Rfl: 0  ROS  Constitutional: Denies any fever or chills Gastrointestinal: No reported hemesis, hematochezia, vomiting, or acute GI distress Musculoskeletal: Denies any acute onset joint swelling, redness, loss of ROM, or weakness Neurological: No reported episodes of acute onset apraxia, aphasia, dysarthria, agnosia, amnesia, paralysis, loss of coordination, or loss of consciousness  Allergies  [name] is allergic to phenergan [promethazine  hcl].  PFSH  Drug: [name]  reports that she does not use drugs. Alcohol:  reports that she does not drink alcohol. Tobacco:  reports that  has never smoked. she has never used smokeless tobacco. Medical:  has a past medical history of Allergy, Anemia, iron deficiency (04/09/2014), Arthritis, Atypical chest pain (04/09/2014), CD (contact dermatitis) (03/01/2014), Chest pain (04/09/2014), and Lumbar radicular pain (08/22/2015). Surgical: [name]  has a past surgical history that includes Back surgery; Appendectomy; Cholecystectomy; Cesarean section; and Bunionectomy. Family: family history includes Asthma in her father; Stroke in her mother.  Constitutional Exam  General appearance: Well nourished, well developed, and well hydrated. In no apparent acute distress Vitals:   09/20/17 1028  BP: 133/77  Pulse: 83  Temp: 97.8 F (36.6 C)  Weight: 212 lb (96.2 kg)  Height: '5\' 7"'$  (1.702 m)   BMI Assessment: Estimated body mass index is 33.2 kg/m as calculated from the following:   Height as of this encounter: '5\' 7"'$  (1.702 m).   Weight as of this encounter: 212 lb (96.2 kg). Psych/Mental status: Alert, oriented x 3 (person, place, & time)       Eyes: PERLA Respiratory: No evidence of acute respiratory distress  Cervical Spine  Area Exam  Skin & Axial Inspection: No masses, redness, edema, swelling, or associated skin lesions Alignment: Symmetrical Functional ROM: Unrestricted ROM      Stability: No instability detected Muscle Tone/Strength: Functionally intact. No obvious neuro-muscular anomalies detected. Sensory (Neurological): Unimpaired Palpation: No palpable anomalies              Upper Extremity (UE) Exam    Side: Right upper extremity  Side: Left upper extremity  Skin & Extremity Inspection: Skin color, temperature, and hair growth are WNL. No peripheral edema or cyanosis. No masses, redness, swelling, asymmetry, or associated skin lesions. No contractures.  Skin & Extremity  Inspection: Skin color, temperature, and hair growth are WNL. No peripheral edema or cyanosis. No masses, redness, swelling, asymmetry, or associated skin lesions. No contractures.  Functional ROM: Unrestricted ROM          Functional ROM: Unrestricted ROM          Muscle Tone/Strength: Functionally intact. No obvious neuro-muscular anomalies detected.  Muscle Tone/Strength: Functionally intact. No obvious neuro-muscular anomalies detected.  Sensory (Neurological): Unimpaired          Sensory (Neurological): Unimpaired          Palpation: No palpable anomalies              Palpation: No palpable anomalies              Specialized Test(s): Deferred         Specialized Test(s): Deferred          Thoracic Spine Area Exam  Skin & Axial Inspection: No masses, redness, or swelling Alignment: Symmetrical Functional ROM: Unrestricted ROM Stability: No instability detected Muscle Tone/Strength: Functionally intact. No obvious neuro-muscular anomalies detected. Sensory (Neurological): Unimpaired Muscle strength & Tone: No palpable anomalies  Lumbar Spine Area Exam  Skin & Axial Inspection: No masses, redness, or swelling Alignment: Symmetrical Functional ROM: Unrestricted ROM      Stability: No instability detected Muscle Tone/Strength: Functionally intact. No obvious neuro-muscular anomalies detected. Sensory (Neurological): Unimpaired Palpation: No palpable anomalies       Provocative Tests: Lumbar Hyperextension and rotation test: evaluation deferred today       Lumbar Lateral bending test: evaluation deferred today       Patrick's Maneuver: evaluation deferred today                    Gait & Posture Assessment  Ambulation: Unassisted Gait: Relatively normal for age and body habitus Posture: WNL   Lower Extremity Exam    Side: Right lower extremity  Side: Left lower extremity  Skin & Extremity Inspection: Skin color, temperature, and hair growth are WNL. No peripheral edema or cyanosis.  No masses, redness, swelling, asymmetry, or associated skin lesions. No contractures.  Skin & Extremity Inspection: Skin color, temperature, and hair growth are WNL. No peripheral edema or cyanosis. No masses, redness, swelling, asymmetry, or associated skin lesions. No contractures.  Functional ROM: Unrestricted ROM          Functional ROM: Unrestricted ROM          Muscle Tone/Strength: Functionally intact. No obvious neuro-muscular anomalies detected.  Muscle Tone/Strength: Functionally intact. No obvious neuro-muscular anomalies detected.  Sensory (Neurological): Unimpaired  Sensory (Neurological): Unimpaired  Palpation: No palpable anomalies  Palpation: No palpable anomalies   Assessment  Primary Diagnosis & Pertinent Problem List: The primary encounter diagnosis was Chronic low back pain (Location of Primary Source of Pain) (Bilateral) (L>R). Diagnoses of Chronic pain syndrome,  Other chronic pain, Spondylolisthesis of lumbar region, Musculoskeletal pain, Spasm of paraspinal muscle, Neurogenic pain, and Chronic pain of both hips were also pertinent to this visit.  Status Diagnosis  Controlled Controlled Controlled 1. Chronic low back pain (Location of Primary Source of Pain) (Bilateral) (L>R)   2. Chronic pain syndrome   3. Other chronic pain   4. Spondylolisthesis of lumbar region   5. Musculoskeletal pain   6. Spasm of paraspinal muscle   7. Neurogenic pain   8. Chronic pain of both hips     Problems updated and reviewed during this visit: No problems updated. Plan of Care  Pharmacotherapy (Medications Ordered): Meds ordered this encounter  Medications  . HYDROcodone-acetaminophen (NORCO) 10-325 MG tablet    Sig: Take 1 tablet every 12 (twelve) hours as needed by mouth for severe pain.    Dispense:  45 tablet    Refill:  0    Do not place this medication, or any other prescription from our practice, on "Automatic Refill". Patient may have prescription filled one day early if  pharmacy is closed on scheduled refill date. Do not fill until:12/05/2017 To last until: 01/04/2018    Order Specific Question:   Supervising Provider    Answer:   Milinda Pointer [phone]  . HYDROcodone-acetaminophen (NORCO) 10-325 MG tablet    Sig: Take 1 tablet every 12 (twelve) hours as needed by mouth for severe pain.    Dispense:  45 tablet    Refill:  0    Do not place this medication, or any other prescription from our practice, on "Automatic Refill". Patient may have prescription filled one day early if pharmacy is closed on scheduled refill date. Do not fill until: 11/05/2017 To last until: 12/05/2017    Order Specific Question:   Supervising Provider    Answer:   Milinda Pointer [phone]  . HYDROcodone-acetaminophen (NORCO) 10-325 MG tablet    Sig: Take 1 tablet every 12 (twelve) hours as needed by mouth for severe pain.    Dispense:  60 tablet    Refill:  0    Do not place this medication, or any other prescription from our practice, on "Automatic Refill". Patient may have prescription filled one day early if pharmacy is closed on scheduled refill date. Do not fill until: 10/06/2017 To last until:11/05/2017    Order Specific Question:   Supervising Provider    Answer:   Milinda Pointer [phone]  . meloxicam (MOBIC) 15 MG tablet    Sig: Take 1 tablet (15 mg total) every morning by mouth.    Dispense:  90 tablet    Refill:  0    Do not place this medication, or any other prescription from our practice, on "Automatic Refill". Patient may have prescription filled one day early if pharmacy is closed on scheduled refill date.    Order Specific Question:   Supervising Provider    Answer:   Milinda Pointer [phone]  . cyclobenzaprine (FLEXERIL) 10 MG tablet    Sig: Take 1 tablet (10 mg total) daily by mouth.    Dispense:  90 tablet    Refill:  0    Do not place this medication, or any other prescription from our practice, on "Automatic Refill". Patient may have prescription  filled one day early if pharmacy is closed on scheduled refill date.    Order Specific Question:   Supervising Provider    Answer:   Milinda Pointer [phone]  . gabapentin (NEURONTIN) 300 MG capsule  Sig: Take 1-3 capsules (300-900 mg total) 4 (four) times daily by mouth.    Dispense:  360 capsule    Refill:  2    Do not place this medication, or any other prescription from our practice, on "Automatic Refill". Patient may have prescription filled one day early if pharmacy is closed on scheduled refill date.    Order Specific Question:   Supervising Provider    Answer:   Milinda Pointer [[id number]]  This SmartLink is deprecated. Use AVSMEDLIST instead to display the medication list for a patient. Medications administered today: Barbaraann Rondo. Thacker had no medications administered during this visit. Lab-work, procedure(s), and/or referral(s): No orders of the defined types were placed in this encounter.  Imaging and/or referral(s): None  Interventional therapies: Planned, scheduled, and/or pending:  Not at this time.    Considering:  Diagnostic bilateral lumbar facet block Possible bilateral lumbar facet RFA Diagnostic Caudal ESI Diagnostic Bilateral L4-5 & L5-S1Lumbar TFESI Diagnostic bilateral sacroiliac joint block Possible bilateral sacroiliac joint RFA Diagnostic bilateral hip joint injection Possible bilateral hip joint RFA Diagnostic bilateral knee joint injection Possible series of Hyalgan knee injections Diagnostic bilateral genicular nerve block Possible bilateral genicular nerve RFA.   Palliative PRN treatment(s):  PalliativeCaudal ESI   Provider-requested follow-up: Return in about 3 months (around 12/21/2017) for MedMgmt.  Future Appointments  Date Time Provider [place]  12/21/2017 10:30 AM Vevelyn Francois, NP [facility] None   Primary Care Physician: [facility] Location: [facility] Out[facility] Management Facility Note by: Vevelyn Francois NP Date: 09/20/2017; Time: 2:58 PM  Pain Score Disclaimer: We use the NRS-11 scale. This is a self-reported, subjective measurement of pain severity with only modest accuracy. It is used primarily to identify changes within a particular patient. It must be understood that outpatient pain scales are significantly less accurate that those used for research, where they can be applied under ideal controlled circumstances with minimal exposure to variables. In reality, the score is likely to be a combination of pain intensity and pain affect, where pain affect describes the degree of emotional arousal or changes in action readiness caused by the sensory experience of pain. Factors such as social and work situation, setting, emotional state, anxiety levels, expectation, and prior pain experience may influence pain perception and show large inter-individual differences that may also be affected by time variables.  Patient instructions provided during this appointment: Patient Instructions    ____________________________________________________________________________________________  Medication Rules  Applies to: All patients receiving prescriptions (written or electronic).  Pharmacy of record: Pharmacy where electronic prescriptions will be sent. If written prescriptions are taken to a different pharmacy, please inform the nursing staff. The pharmacy listed in the electronic medical record should be the one where you would like electronic prescriptions to be sent.  Prescription refills: Only during scheduled appointments. Applies to both, written and electronic prescriptions.  NOTE: The following applies primarily to controlled substances (Opioid* Pain Medications).   Patient's responsibilities: 1. Pain Pills: Bring all pain pills to every appointment (except for procedure appointments). 2. Pill Bottles: Bring pills in original pharmacy bottle. Always bring newest bottle. Bring bottle, even  if empty. 3. Medication refills: You are responsible for knowing and keeping track of what medications you need refilled. The day before your appointment, write a list of all prescriptions that need to be refilled. Bring that list to your appointment and give it to the admitting nurse. Prescriptions will be written only during appointments. If you forget a medication,  it will not be "Called in", "Faxed", or "electronically sent". You will need to get another appointment to get these prescribed. 4. Prescription Accuracy: You are responsible for carefully inspecting your prescriptions before leaving our office. Have the discharge nurse carefully go over each prescription with you, before taking them home. Make sure that your name is accurately spelled, that your address is correct. Check the name and dose of your medication to make sure it is accurate. Check the number of pills, and the written instructions to make sure they are clear and accurate. Make sure that you are given enough medication to last until your next medication refill appointment. 5. Taking Medication: Take medication as prescribed. Never take more pills than instructed. Never take medication more frequently than prescribed. Taking less pills or less frequently is permitted and encouraged, when it comes to controlled substances (written prescriptions).  6. Inform other Doctors: Always inform, all of your healthcare providers, of all the medications you take. 7. Pain Medication from other Providers: You are not allowed to accept any additional pain medication from any other Doctor or Healthcare provider. There are two exceptions to this rule. (see below) In the event that you require additional pain medication, you are responsible for notifying us, as stated below. 8. Medication Agreement: You are responsible for carefully reading and following our Medication Agreement. This must be signed before receiving any prescriptions from our practice.  Safely store a copy of your signed Agreement. Violations to the Agreement will result in no further prescriptions. (Additional copies of our Medication Agreement are available upon request.) 9. Laws, Rules, & Regulations: All patients are expected to follow all Federal and State Laws, Statutes, Rules, & Regulations. Ignorance of the Laws does not constitute a valid excuse. The use of any illegal substances is prohibited. 10. Adopted CDC guidelines & recommendations: Target dosing levels will be at or below 60 MME/day. Use of benzodiazepines** is not recommended.  Exceptions: There are only two exceptions to the rule of not receiving pain medications from other Healthcare Providers. 1. Exception #1 (Emergencies): In the event of an emergency (i.e.: accident requiring emergency care), you are allowed to receive additional pain medication. However, you are responsible for: As soon as you are able, call our office [phone], at any time of the day or night, and leave a message stating your name, the date and nature of the emergency, and the name and dose of the medication prescribed. In the event that your call is answered by a member of our staff, make sure to document and save the date, time, and the name of the person that took your information.  2. Exception #2 (Planned Surgery): In the event that you are scheduled by another doctor or dentist to have any type of surgery or procedure, you are allowed (for a period no longer than 30 days), to receive additional pain medication, for the acute post-op pain. However, in this case, you are responsible for picking up a copy of our "Post-op Pain Management for Surgeons" handout, and giving it to your surgeon or dentist. This document is available at our office, and does not require an appointment to obtain it. Simply go to our office during business hours (Monday-Thursday from 8:00 AM to 4:00 PM) (Friday 8:00 AM to 12:00 Noon) or if you have a scheduled  appointment with us, prior to your surgery, and ask for it by name. In addition, you will need to provide us with your name, name of your surgeon,   type of surgery, and date of procedure or surgery.  *Opioid medications include: morphine, codeine, oxycodone, oxymorphone, hydrocodone, hydromorphone, meperidine, tramadol, tapentadol, buprenorphine, fentanyl, methadone. **Benzodiazepine medications include: diazepam (Valium), alprazolam (Xanax), clonazepam (Klonopine), lorazepam (Ativan), clorazepate (Tranxene), chlordiazepoxide (Librium), estazolam (Prosom), oxazepam (Serax), temazepam (Restoril), triazolam (Halcion)  ____________________________________________________________________________________________  Gabapentin capsules or tablets What is this medicine? GABAPENTIN (GA ba pen tin) is used to control partial seizures in adults with epilepsy. It is also used to treat certain types of nerve pain. This medicine may be used for other purposes; ask your health care provider or pharmacist if you have questions. COMMON BRAND NAME(S): Active-PAC with Gabapentin, Gabarone, Neurontin What should I tell my health care provider before I take this medicine? They need to know if you have any of these conditions: -kidney disease -suicidal thoughts, plans, or attempt; a previous suicide attempt by you or a family member -an unusual or allergic reaction to gabapentin, other medicines, foods, dyes, or preservatives -pregnant or trying to get pregnant -breast-feeding How should I use this medicine? Take this medicine by mouth with a glass of water. Follow the directions on the prescription label. You can take it with or without food. If it upsets your stomach, take it with food.Take your medicine at regular intervals. Do not take it more often than directed. Do not stop taking except on your doctor's advice. If you are directed to break the 600 or 800 mg tablets in half as part of your dose, the extra half  tablet should be used for the next dose. If you have not used the extra half tablet within 28 days, it should be thrown away. A special MedGuide will be given to you by the pharmacist with each prescription and refill. Be sure to read this information carefully each time. Talk to your pediatrician regarding the use of this medicine in children. Special care may be needed. Overdosage: If you think you have taken too much of this medicine contact a poison control center or emergency room at once. NOTE: This medicine is only for you. Do not share this medicine with others. What if I miss a dose? If you miss a dose, take it as soon as you can. If it is almost time for your next dose, take only that dose. Do not take double or extra doses. What may interact with this medicine? Do not take this medicine with any of the following medications: -other gabapentin products This medicine may also interact with the following medications: -alcohol -antacids -antihistamines for allergy, cough and cold -certain medicines for anxiety or sleep -certain medicines for depression or psychotic disturbances -homatropine; hydrocodone -naproxen -narcotic medicines (opiates) for pain -phenothiazines like chlorpromazine, mesoridazine, prochlorperazine, thioridazine This list may not describe all possible interactions. Give your health care provider a list of all the medicines, herbs, non-prescription drugs, or dietary supplements you use. Also tell them if you smoke, drink alcohol, or use illegal drugs. Some items may interact with your medicine. What should I watch for while using this medicine? Visit your doctor or health care professional for regular checks on your progress. You may want to keep a record at home of how you feel your condition is responding to treatment. You may want to share this information with your doctor or health care professional at each visit. You should contact your doctor or health care  professional if your seizures get worse or if you have any new types of seizures. Do not stop taking this medicine or any of  your seizure medicines unless instructed by your doctor or health care professional. Stopping your medicine suddenly can increase your seizures or their severity. Wear a medical identification bracelet or chain if you are taking this medicine for seizures, and carry a card that lists all your medications. You may get drowsy, dizzy, or have blurred vision. Do not drive, use machinery, or do anything that needs mental alertness until you know how this medicine affects you. To reduce dizzy or fainting spells, do not sit or stand up quickly, especially if you are an older patient. Alcohol can increase drowsiness and dizziness. Avoid alcoholic drinks. Your mouth may get dry. Chewing sugarless gum or sucking hard candy, and drinking plenty of water will help. The use of this medicine may increase the chance of suicidal thoughts or actions. Pay special attention to how you are responding while on this medicine. Any worsening of mood, or thoughts of suicide or dying should be reported to your health care professional right away. Women who become pregnant while using this medicine may enroll in the [place] Pregnancy Registry by calling [phone]. This registry collects information about the safety of antiepileptic drug use during pregnancy. What side effects may I notice from receiving this medicine? Side effects that you should report to your doctor or health care professional as soon as possible: -allergic reactions like skin rash, itching or hives, swelling of the face, lips, or tongue -worsening of mood, thoughts or actions of suicide or dying Side effects that usually do not require medical attention (report to your doctor or health care professional if they continue or are bothersome): -constipation -difficulty walking or controlling muscle  movements -dizziness -nausea -slurred speech -tiredness -tremors -weight gain This list may not describe all possible side effects. Call your doctor for medical advice about side effects. You may report side effects to FDA at 1-800-FDA-1088. Where should I keep my medicine? Keep out of reach of children. This medicine may cause accidental overdose and death if it taken by other adults, children, or pets. Mix any unused medicine with a substance like cat litter or coffee grounds. Then throw the medicine away in a sealed container like a sealed bag or a coffee can with a lid. Do not use the medicine after the expiration date. Store at room temperature between 15 and 30 degrees C (59 and 86 degrees F). NOTE: This sheet is a summary. It may not cover all possible information. If you have questions about this medicine, talk to your doctor, pharmacist, or health care provider.  2018 [employer]/Gold Standard (2013-12-15 15:26:50)  BMI Assessment: Estimated body mass index is 33.2 kg/m as calculated from the following:   Height as of this encounter: '5\' 7"'$  (1.702 m).   Weight as of this encounter: 212 lb (96.2 kg).  BMI interpretation table: BMI level Category Range association with higher incidence of chronic pain  <18 kg/m2 Underweight   18.5-24.9 kg/m2 Ideal body weight   25-29.9 kg/m2 Overweight Increased incidence by 20%  30-34.9 kg/m2 Obese (Class I) Increased incidence by 68%  35-39.9 kg/m2 Severe obesity (Class II) Increased incidence by 136%  >40 kg/m2 Extreme obesity (Class III) Increased incidence by 254%   BMI Readings from Last 4 Encounters:  09/20/17 33.20 kg/m  06/23/17 33.99 kg/m  03/23/17 33.99 kg/m  02/01/17 33.20 kg/m   Wt Readings from Last 4 Encounters:  09/20/17 212 lb (96.2 kg)  06/23/17 217 lb (98.4 kg)  03/23/17 217 lb (98.4 kg)  02/01/17 212  lb (96.2 kg)

## 2017-09-20 NOTE — Progress Notes (Signed)
Nursing Pain Medication Assessment:  Safety precautions to be maintained throughout the outpatient stay will include: orient to surroundings, keep bed in low position, maintain call bell within reach at all times, provide assistance with transfer out of bed and ambulation.  Medication Inspection Compliance: Pill count conducted under aseptic conditions, in front of the patient. Neither the pills nor the bottle was removed from the patient's sight at any time. Once count was completed pills were immediately returned to the patient in their original bottle.  Medication: Hydrocodone/APAP Pill/Patch Count: 15 of 45 pills remain Pill/Patch Appearance: Markings consistent with prescribed medication Bottle Appearance: Standard pharmacy container. Clearly labeled. Filled Date: 27 / 05/ 2018 Last Medication intake:  Today

## 2017-10-07 ENCOUNTER — Telehealth: Payer: Self-pay | Admitting: Pain Medicine

## 2017-10-07 NOTE — Telephone Encounter (Signed)
Patient is in [place] with daughter, she needs verification ok to [facility] at [facility]. [facility] will [facility] get [facility].

## 2017-10-07 NOTE — Telephone Encounter (Signed)
Spoke with pharmacy in [place] and verified Rx for hydrocodone - apap

## 2017-12-08 ENCOUNTER — Telehealth: Payer: Self-pay | Admitting: *Deleted

## 2017-12-08 NOTE — Telephone Encounter (Signed)
Patient called and is having problems with getting medications PA through workers compensation.  I told her that we do not manage or handle workers comp cases and getting medications.  She verbalizes u/o information and will continue to try and get in touch with case worker.

## 2017-12-21 ENCOUNTER — Ambulatory Visit: Payer: Medicare Other | Attending: Nurse Practitioner | Admitting: Nurse Practitioner

## 2017-12-21 ENCOUNTER — Other Ambulatory Visit: Payer: Self-pay

## 2017-12-21 ENCOUNTER — Ambulatory Visit
Admission: RE | Admit: 2017-12-21 | Discharge: 2017-12-21 | Disposition: A | Discharge status: Home / Self Care | Payer: Medicare Other | Source: Ambulatory Visit | Attending: Nurse Practitioner | Admitting: Nurse Practitioner

## 2017-12-21 ENCOUNTER — Ambulatory Visit
Admission: RE | Admit: 2017-12-21 | Discharge: 2017-12-21 | Disposition: A | Discharge status: Home / Self Care | Payer: Worker's Compensation | Source: Ambulatory Visit | Attending: Nurse Practitioner | Admitting: Nurse Practitioner

## 2017-12-21 ENCOUNTER — Encounter: Payer: Self-pay | Admitting: Nurse Practitioner

## 2017-12-21 VITALS — BP 131/77 | HR 83 | Temp 98.0°F | Resp 16 | Ht 67.0 in | Wt 208.0 lb

## 2017-12-21 DIAGNOSIS — S83231A Complex tear of medial meniscus, current injury, right knee, initial encounter: Secondary | ICD-10-CM | POA: Insufficient documentation

## 2017-12-21 DIAGNOSIS — M533 Sacrococcygeal disorders, not elsewhere classified: Secondary | ICD-10-CM | POA: Insufficient documentation

## 2017-12-21 DIAGNOSIS — G4700 Insomnia, unspecified: Secondary | ICD-10-CM | POA: Insufficient documentation

## 2017-12-21 DIAGNOSIS — M79605 Pain in left leg: Secondary | ICD-10-CM | POA: Diagnosis not present

## 2017-12-21 DIAGNOSIS — M79604 Pain in right leg: Secondary | ICD-10-CM | POA: Insufficient documentation

## 2017-12-21 DIAGNOSIS — M170 Bilateral primary osteoarthritis of knee: Secondary | ICD-10-CM

## 2017-12-21 DIAGNOSIS — M4316 Spondylolisthesis, lumbar region: Secondary | ICD-10-CM

## 2017-12-21 DIAGNOSIS — M160 Bilateral primary osteoarthritis of hip: Secondary | ICD-10-CM | POA: Diagnosis not present

## 2017-12-21 DIAGNOSIS — M545 Low back pain: Secondary | ICD-10-CM | POA: Insufficient documentation

## 2017-12-21 DIAGNOSIS — G4730 Sleep apnea, unspecified: Secondary | ICD-10-CM | POA: Insufficient documentation

## 2017-12-21 DIAGNOSIS — M62830 Muscle spasm of back: Secondary | ICD-10-CM | POA: Diagnosis not present

## 2017-12-21 DIAGNOSIS — G8929 Other chronic pain: Secondary | ICD-10-CM

## 2017-12-21 DIAGNOSIS — E538 Deficiency of other specified B group vitamins: Secondary | ICD-10-CM | POA: Diagnosis not present

## 2017-12-21 DIAGNOSIS — M25552 Pain in left hip: Secondary | ICD-10-CM | POA: Insufficient documentation

## 2017-12-21 DIAGNOSIS — M48061 Spinal stenosis, lumbar region without neurogenic claudication: Secondary | ICD-10-CM | POA: Insufficient documentation

## 2017-12-21 DIAGNOSIS — G894 Chronic pain syndrome: Secondary | ICD-10-CM | POA: Insufficient documentation

## 2017-12-21 DIAGNOSIS — G4701 Insomnia due to medical condition: Secondary | ICD-10-CM | POA: Diagnosis not present

## 2017-12-21 DIAGNOSIS — M792 Neuralgia and neuritis, unspecified: Secondary | ICD-10-CM

## 2017-12-21 DIAGNOSIS — Z79891 Long term (current) use of opiate analgesic: Secondary | ICD-10-CM | POA: Diagnosis not present

## 2017-12-21 DIAGNOSIS — M25562 Pain in left knee: Secondary | ICD-10-CM | POA: Insufficient documentation

## 2017-12-21 DIAGNOSIS — M25551 Pain in right hip: Secondary | ICD-10-CM | POA: Insufficient documentation

## 2017-12-21 DIAGNOSIS — Z79899 Other long term (current) drug therapy: Secondary | ICD-10-CM | POA: Diagnosis not present

## 2017-12-21 DIAGNOSIS — M179 Osteoarthritis of knee, unspecified: Secondary | ICD-10-CM | POA: Diagnosis not present

## 2017-12-21 DIAGNOSIS — M7918 Myalgia, other site: Secondary | ICD-10-CM | POA: Insufficient documentation

## 2017-12-21 DIAGNOSIS — E559 Vitamin D deficiency, unspecified: Secondary | ICD-10-CM | POA: Insufficient documentation

## 2017-12-21 DIAGNOSIS — M25561 Pain in right knee: Secondary | ICD-10-CM | POA: Diagnosis not present

## 2017-12-21 MED ORDER — TRIAMCINOLONE ACETONIDE 0.1 % EX OINT: TOPICAL_OINTMENT | Freq: Every evening | CUTANEOUS | 2 refills | Status: DC

## 2017-12-21 MED ORDER — HYDROCODONE-ACETAMINOPHEN 10-325 MG PO TABS: 1.0000 | ORAL_TABLET | Freq: Two times a day (BID) | ORAL | 0 refills | Status: DC | PRN

## 2017-12-21 MED ORDER — CYCLOBENZAPRINE HCL 10 MG PO TABS: 10.0000 mg | ORAL_TABLET | Freq: Every day | ORAL | 0 refills | Status: DC

## 2017-12-21 MED ORDER — CVS MELATONIN 10 MG PO CAPS: 10.0000 mg | ORAL_CAPSULE | Freq: Every day | ORAL | 2 refills | Status: DC

## 2017-12-21 MED ORDER — MELOXICAM 15 MG PO TABS: 15.0000 mg | ORAL_TABLET | Freq: Every morning | ORAL | 0 refills | Status: DC

## 2017-12-21 MED ORDER — GABAPENTIN 300 MG PO CAPS: 300.0000 mg | ORAL_CAPSULE | Freq: Four times a day (QID) | ORAL | 2 refills | Status: DC

## 2017-12-21 NOTE — Progress Notes (Signed)
Patient's Name: Stephanie [name]  MRN: [id number]  Referring Provider: [facility]  DOB: 1961/06/10  PCP: [facility]  DOS: 12/21/2017  Note by: Vevelyn Francois NP  Service setting: Ambulatory outpatient  Specialty: Interventional Pain Management  Location: [facility] (AMB) Pain Management Facility    Patient type: Established    Primary Reason(s) for Visit: Encounter for prescription drug management. (Level of risk: moderate)  CC: Back Pain (lower)  HPI  Stephanie [name] is a 57 y.o. year old, female patient, who comes today for a medication management evaluation. 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 Her primarily  concern today is the Back Pain (lower)  Pain Assessment: Location: Lower, Left, Right Back Radiating: buttocks/hips then around to front thighs down to top of feet bilateral Onset: More than a month ago Duration: Chronic pain Quality: Aching, Burning, Throbbing, Constant Severity: 5 /10 (self-reported pain score)  Note: Reported level is compatible with observation. Clinically the patient looks like a 2/10 A 2/10 is viewed as "Mild to Moderate" and described as noticeable and distracting. Impossible to hide from other people. More frequent flare-ups. Still possible to adapt and function close to normal. It can be very annoying and may have occasional stronger flare-ups. With discipline, patients may get used to it and adapt. Information on the proper use of the pain scale provided to the patient today.  Effect on ADL: Walking  long  distances, bending, in and out of car, sitting a long time of time Timing: Constant Modifying factors: heat, medications, elevate knees  Stephanie [name] was last scheduled for an appointment on 09/20/2017 for medication management. During today's appointment we reviewed [name]'s chronic pain status, as well as her outpatient medication regimen. She admits that her knee has increased. She admits that she is status post arthroscopic knee surgery in [place]; 2011. She admits that the knee pain is getting worse. She describes it as being bilateral. She admits that she has swelling in both knees. She is also has cramps behind her knees. She admits that it is difficult to get up and down. She is unemployed. She admits that the Trazadone give her weird dreams.   The patient  reports that she does not use drugs. Her body mass index is 32.58 kg/m.  Further details on both, my assessment(s), as well as the proposed treatment plan, please see below.  Controlled Substance Pharmacotherapy Assessment REMS (Risk Evaluation and Mitigation Strategy)  Analgesic:  Hydrocodone/acetaminophen  10/325 mg twice daily MME/day:20 mg/day.    Ignatius Specking, RN  12/21/2017 10:51 AM  Sign at close encounter Nursing Pain Medication Assessment:  Safety precautions to be maintained throughout the outpatient stay will include: orient to surroundings, keep bed in low position, maintain call bell within reach at all times, provide assistance with transfer out of bed and ambulation.  Medication Inspection Compliance: Pill count conducted under aseptic conditions, in front of the patient. Neither the pills nor the bottle was removed from the patient's sight at any time. Once count was completed pills were immediately returned to the patient in their original bottle.  Medication: See above Pill/Patch Count: 28 of 60 pills remain Pill/Patch Appearance: Markings consistent with prescribed medication Bottle Appearance: Standard pharmacy container. Clearly labeled. Filled Date: 2 / 4 / 2019 Last Medication intake:  Today   Pharmacokinetics: Liberation and absorption (onset of action): WNL Distribution (time to peak effect): WNL Metabolism and excretion (duration of action): WNL         Pharmacodynamics: Desired effects: Analgesia: Stephanie [name] reports >50% benefit. Functional ability: Patient reports that medication allows her to accomplish basic ADLs Clinically meaningful improvement in function (CMIF): Sustained CMIF goals met Perceived effectiveness: Described as relatively effective, allowing for increase in activities of daily living (ADL) Undesirable effects: Side-effects or Adverse reactions: None reported Monitoring: [place] PMP: Online review of the past 78-monthperiod conducted. Compliant with practice rules and regulations Last UDS on record: Summary  Date Value Ref Range Status  06/23/2017 FINAL  Final    Comment:    ==================================================================== TOXASSURE SELECT 13  (MW) ==================================================================== Test                             Result       Flag       Units Drug Present not Declared for Prescription Verification   Oxycodone                      1513         UNEXPECTED ng/mg creat   Oxymorphone                    1010         UNEXPECTED ng/mg creat   Noroxycodone                   2871         UNEXPECTED ng/mg creat   Noroxymorphone                 409          UNEXPECTED ng/mg creat    Sources of oxycodone are scheduled prescription medications.    Oxymorphone, noroxycodone, and noroxymorphone are expected    metabolites of oxycodone. Oxymorphone is also available as a    scheduled prescription medication. Drug Absent but Declared for Prescription Verification   Hydrocodone                    Not Detected UNEXPECTED ng/mg creat ==================================================================== Test                      Result    Flag   Units      Ref Range   Creatinine              68  mg/dL      >=20 ==================================================================== Declared Medications:  The flagging and interpretation on this report are based on the  following declared medications.  Unexpected results may arise from  inaccuracies in the declared medications.  **Note: The testing scope of this panel includes these medications:  Hydrocodone (Norco)  **Note: The testing scope of this panel does not include following  reported medications:  Acetaminophen (Norco)  Cyclobenzaprine (Flexeril)  Gabapentin  Melatonin  Meloxicam (Mobic)  Trazodone  Triamcinolone (Kenalog) ==================================================================== For clinical consultation, please call [phone]. ====================================================================    UDS interpretation: Compliant          Medication Assessment Form: Reviewed. Patient indicates being compliant with  therapy Treatment compliance: Compliant Risk Assessment Profile: Aberrant behavior: See prior evaluations. None observed or detected today Comorbid factors increasing risk of overdose: See prior notes. No additional risks detected today Risk of substance use disorder (SUD): Low Opioid Risk Tool - 12/21/17 1048      Family History of Substance Abuse   Alcohol  Negative    Illegal Drugs  Negative    Rx Drugs  Negative      Personal History of Substance Abuse   Alcohol  Negative    Illegal Drugs  Negative    Rx Drugs  Negative      Psychological Disease   Psychological Disease  Negative    Depression  Negative      Total Score   Opioid Risk Tool Scoring  0    Opioid Risk Interpretation  Low Risk      ORT Scoring interpretation table:  Score <3 = Low Risk for SUD  Score between 4-7 = Moderate Risk for SUD  Score >8 = High Risk for Opioid Abuse   Risk Mitigation Strategies:  Patient Counseling: Covered Patient-Prescriber Agreement (PPA): Present and active  Notification to other healthcare providers: Done  Pharmacologic Plan: No change in therapy, at this time.             Laboratory Chemistry  Inflammation Markers (CRP: Acute Phase) (ESR: Chronic Phase) Lab Results  Component Value Date   CRP 0.7 11/18/2015   ESRSEDRATE 27 11/18/2015                         Rheumatology Markers No results found for: RF, ANA, Therisa Doyne, [facility]              Renal Function Markers Lab Results  Component Value Date   BUN 8 06/23/2017   CREATININE 0.59 06/23/2017   GFRAA >60 06/23/2017   GFRNONAA >60 06/23/2017                 Hepatic Function Markers Lab Results  Component Value Date   AST 17 06/23/2017   ALT 13 (L) 06/23/2017   ALBUMIN 4.2 06/23/2017   ALKPHOS 60 06/23/2017                 Electrolytes Lab Results  Component Value Date   NA 142 06/23/2017   K 4.0 06/23/2017   CL 107 06/23/2017   CALCIUM 9.4 06/23/2017   MG 2.0 06/23/2017                         Neuropathy Markers Lab Results  Component Value Date   VITAMINB12 271 06/23/2017   HGBA1C 6.2 (H) 04/10/2014  Bone Pathology Markers Lab Results  Component Value Date   25OHVITD1 34 05/07/2016   25OHVITD2 <1.0 05/07/2016   25OHVITD3 34 05/07/2016                         Coagulation Parameters Lab Results  Component Value Date   PLT 298 04/10/2014                 Cardiovascular Markers Lab Results  Component Value Date   TROPONINI <0.30 04/10/2014   HGB 8.7 (L) 04/10/2014   HCT 28.6 (L) 04/10/2014                 CA Markers No results found for: CEA, CA125, LABCA2               Note: Lab results reviewed.  Recent Diagnostic Imaging Results  DG C-Arm 1-60 Min-No Report Fluoroscopy was utilized by the requesting physician.  No radiographic  interpretation.   Complexity Note: Imaging results reviewed. Results shared with [name], using Layman's terms.                         Meds   Current Outpatient Medications:  .  [START ON 01/05/2018] cyclobenzaprine (FLEXERIL) 10 MG tablet, Take 1 tablet (10 mg total) by mouth daily., Disp: 90 tablet, Rfl: 0 .  [START ON 01/05/2018] gabapentin (NEURONTIN) 300 MG capsule, Take 1-3 capsules (300-900 mg total) by mouth 4 (four) times daily., Disp: 360 capsule, Rfl: 2 .  [START ON 03/06/2018] HYDROcodone-acetaminophen (NORCO) 10-325 MG tablet, Take 1 tablet by mouth every 12 (twelve) hours as needed for severe pain., Disp: 45 tablet, Rfl: 0 .  [START ON 01/05/2018] meloxicam (MOBIC) 15 MG tablet, Take 1 tablet (15 mg total) by mouth every morning., Disp: 90 tablet, Rfl: 0 .  [START ON 01/05/2018] [facility] MELATONIN 10 MG CAPS, Take 10-20 mg by mouth at bedtime., Disp: 60 capsule, Rfl: 2 .  [START ON 02/04/2018] HYDROcodone-acetaminophen (NORCO) 10-325 MG tablet, Take 1 tablet by mouth every 12 (twelve) hours as needed for severe pain., Disp: 45 tablet, Rfl: 0 .  [START ON 01/05/2018] HYDROcodone-acetaminophen  (NORCO) 10-325 MG tablet, Take 1 tablet by mouth every 12 (twelve) hours as needed for severe pain., Disp: 45 tablet, Rfl: 0 .  traZODone (DESYREL) 100 MG tablet, Take 0.5 tablets (50 mg total) by mouth at bedtime as needed for sleep., Disp: 45 tablet, Rfl: 0 .  triamcinolone ointment (KENALOG) 0.1 %, Apply topically Nightly., Disp: 30 g, Rfl: 2  ROS  Constitutional: Denies any fever or chills Gastrointestinal: No reported hemesis, hematochezia, vomiting, or acute GI distress Musculoskeletal: Denies any acute onset joint swelling, redness, loss of ROM, or weakness Neurological: No reported episodes of acute onset apraxia, aphasia, dysarthria, agnosia, amnesia, paralysis, loss of coordination, or loss of consciousness  Allergies  Stephanie [name] is allergic to phenergan [promethazine hcl].  PFSH  Drug: Stephanie [name]  reports that she does not use drugs. Alcohol:  reports that she does not drink alcohol. Tobacco:  reports that  has never smoked. she has never used smokeless tobacco. Medical:  has a past medical history of Allergy, Anemia, iron deficiency (04/09/2014), Arthritis, Atypical chest pain (04/09/2014), CD (contact dermatitis) (03/01/2014), Chest pain (04/09/2014), and Lumbar radicular pain (08/22/2015). Surgical: Stephanie [name]  has a past surgical history that includes Back surgery; Appendectomy; Cholecystectomy; Cesarean section; and Bunionectomy. Family: family history includes Asthma in her father; Stroke in  her mother.  Constitutional Exam  General appearance: Well nourished, well developed, and well hydrated. In no apparent acute distress Vitals:   12/21/17 1037  BP: 131/77  Pulse: 83  Resp: 16  Temp: 98 F (36.7 C)  SpO2: 98%  Weight: 208 lb (94.3 kg)  Height: _0  (1.702 m)  Psych/Mental status: Alert, oriented x 3 (person, place, & time)       Eyes: PERLA Respiratory: No evidence of acute respiratory distress  Cervical Spine Area Exam  Skin & Axial Inspection: No masses,  redness, edema, swelling, or associated skin lesions Alignment: Symmetrical Functional ROM: Unrestricted ROM      Stability: No instability detected Muscle Tone/Strength: Functionally intact. No obvious neuro-muscular anomalies detected. Sensory (Neurological): Unimpaired Palpation: No palpable anomalies              Upper Extremity (UE) Exam    Side: Right upper extremity  Side: Left upper extremity  Skin & Extremity Inspection: Skin color, temperature, and hair growth are WNL. No peripheral edema or cyanosis. No masses, redness, swelling, asymmetry, or associated skin lesions. No contractures.  Skin & Extremity Inspection: Skin color, temperature, and hair growth are WNL. No peripheral edema or cyanosis. No masses, redness, swelling, asymmetry, or associated skin lesions. No contractures.  Functional ROM: Unrestricted ROM          Functional ROM: Unrestricted ROM          Muscle Tone/Strength: Functionally intact. No obvious neuro-muscular anomalies detected.  Muscle Tone/Strength: Functionally intact. No obvious neuro-muscular anomalies detected.  Sensory (Neurological): Unimpaired          Sensory (Neurological): Unimpaired          Palpation: No palpable anomalies              Palpation: No palpable anomalies              Specialized Test(s): Deferred         Specialized Test(s): Deferred          Thoracic Spine Area Exam  Skin & Axial Inspection: No masses, redness, or swelling Alignment: Symmetrical Functional ROM: Unrestricted ROM Stability: No instability detected Muscle Tone/Strength: Functionally intact. No obvious neuro-muscular anomalies detected. Sensory (Neurological): Unimpaired Muscle strength & Tone: No palpable anomalies  Lumbar Spine Area Exam  Skin & Axial Inspection: No masses, redness, or swelling Alignment: Symmetrical Functional ROM: Unrestricted ROM      Stability: No instability detected Muscle Tone/Strength: Functionally intact. No obvious neuro-muscular  anomalies detected. Sensory (Neurological): Unimpaired Palpation: No palpable anomalies       Provocative Tests: Lumbar Hyperextension and rotation test: evaluation deferred today       Lumbar Lateral bending test: evaluation deferred today       Patrick's Maneuver: evaluation deferred today                    Gait & Posture Assessment  Ambulation: Unassisted Gait: Relatively normal for age and body habitus Posture: WNL   Lower Extremity Exam    Side: Right lower extremity  Side: Left lower extremity  Skin & Extremity Inspection: Edema  Skin & Extremity Inspection: Edema  Functional ROM: Adequate ROM          Functional ROM: Adequate ROM          Muscle Tone/Strength: Functionally intact. No obvious neuro-muscular anomalies detected.  Muscle Tone/Strength: Functionally intact. No obvious neuro-muscular anomalies detected.  Sensory (Neurological): Unimpaired  Sensory (Neurological): Unimpaired  Palpation: No palpable  anomalies  Palpation: No palpable anomalies   Assessment  Primary Diagnosis & Pertinent Problem List: The primary encounter diagnosis was Primary osteoarthritis of both knees. Diagnoses of Spondylolisthesis of lumbar region, Neurogenic pain, Chronic pain syndrome, Insomnia secondary to chronic pain, Musculoskeletal pain, Spasm of paraspinal muscle, and Long term current use of opiate analgesic were also pertinent to this visit.  Status Diagnosis  Controlled Controlled Controlled 1. Primary osteoarthritis of both knees   2. Spondylolisthesis of lumbar region   3. Neurogenic pain   4. Chronic pain syndrome   5. Insomnia secondary to chronic pain   6. Musculoskeletal pain   7. Spasm of paraspinal muscle   8. Long term current use of opiate analgesic     Problems updated and reviewed during this visit: No problems updated. Plan of Care  Pharmacotherapy (Medications Ordered): Meds ordered this encounter  Medications  . HYDROcodone-acetaminophen (NORCO) 10-325 MG  tablet    Sig: Take 1 tablet by mouth every 12 (twelve) hours as needed for severe pain.    Dispense:  45 tablet    Refill:  0    Do not place this medication, or any other prescription from our practice, on "Automatic Refill". Patient may have prescription filled one day early if pharmacy is closed on scheduled refill date. Do not fill until:03/06/2018 To last until: 04/05/2018    Order Specific Question:   Supervising Provider    Answer:   Milinda Pointer [phone]  . cyclobenzaprine (FLEXERIL) 10 MG tablet    Sig: Take 1 tablet (10 mg total) by mouth daily.    Dispense:  90 tablet    Refill:  0    Do not place this medication, or any other prescription from our practice, on "Automatic Refill". Patient may have prescription filled one day early if pharmacy is closed on scheduled refill date.    Order Specific Question:   Supervising Provider    Answer:   Milinda Pointer [phone]  . gabapentin (NEURONTIN) 300 MG capsule    Sig: Take 1-3 capsules (300-900 mg total) by mouth 4 (four) times daily.    Dispense:  360 capsule    Refill:  2    Do not place this medication, or any other prescription from our practice, on "Automatic Refill". Patient may have prescription filled one day early if pharmacy is closed on scheduled refill date.    Order Specific Question:   Supervising Provider    Answer:   Milinda Pointer [phone]  . meloxicam (MOBIC) 15 MG tablet    Sig: Take 1 tablet (15 mg total) by mouth every morning.    Dispense:  90 tablet    Refill:  0    Do not place this medication, or any other prescription from our practice, on "Automatic Refill". Patient may have prescription filled one day early if pharmacy is closed on scheduled refill date.    Order Specific Question:   Supervising Provider    Answer:   Milinda Pointer [phone]  . [facility] MELATONIN 10 MG CAPS    Sig: Take 10-20 mg by mouth at bedtime.    Dispense:  60 capsule    Refill:  2    Do not place medication on  "Automatic Refill". Fill one day early if pharmacy is closed on scheduled refill date.    Order Specific Question:   Supervising Provider    Answer:   Milinda Pointer [phone]  . HYDROcodone-acetaminophen (NORCO) 10-325 MG tablet    Sig: Take 1 tablet by  mouth every 12 (twelve) hours as needed for severe pain.    Dispense:  45 tablet    Refill:  0    Do not place this medication, or any other prescription from our practice, on "Automatic Refill". Patient may have prescription filled one day early if pharmacy is closed on scheduled refill date. Do not fill until: 02/04/2018 To last until: 03/06/2018    Order Specific Question:   Supervising Provider    Answer:   Milinda Pointer [phone]  . HYDROcodone-acetaminophen (NORCO) 10-325 MG tablet    Sig: Take 1 tablet by mouth every 12 (twelve) hours as needed for severe pain.    Dispense:  45 tablet    Refill:  0    Do not place this medication, or any other prescription from our practice, on "Automatic Refill". Patient may have prescription filled one day early if pharmacy is closed on scheduled refill date. Do not fill until: 01/05/2018 To last until: 02/04/2018    Order Specific Question:   Supervising Provider    Answer:   Milinda Pointer [phone]  . triamcinolone ointment (KENALOG) 0.1 %    Sig: Apply topically Nightly.    Dispense:  30 g    Refill:  2    Order Specific Question:   Supervising Provider    AnswerMilinda Pointer [phone]   New Prescriptions   No medications on file   Medications administered today: Franki Alcaide. Thacker had no medications administered during this visit. Lab-work, procedure(s), and/or referral(s): Orders Placed This Encounter  Procedures  . DG Knee 1-2 Views Left  . DG Knee 1-2 Views Right  . ToxASSURE Select 13 (MW), Urine   Imaging and/or referral(s): None  Interventional therapies: Planned, scheduled, and/or pending: Not at this time.    Considering:  Diagnostic bilateral lumbar  facet block Possible bilateral lumbar facet RFA Diagnostic Caudal ESI Diagnostic Bilateral L4-5 & L5-S1Lumbar TFESI Diagnostic bilateral sacroiliac joint block Possible bilateral sacroiliac joint RFA Diagnostic bilateral hip joint injection Possible bilateral hip joint RFA Diagnostic bilateral knee joint injection Possible series of Hyalgan knee injections Diagnostic bilateral genicular nerve block Possible bilateral genicular nerve RFA.   Palliative PRN treatment(s):  PalliativeCaudal ESI      Provider-requested follow-up: Return in about 3 months (around 03/20/2018) for MedMgmt with Me Donella Stade Edison Pace).  Future Appointments  Date Time Provider [place]  03/14/2018  9:30 AM Vevelyn Francois, NP [facility] None   Primary Care Physician: [facility] Location: [facility] Note by: Vevelyn Francois NP Date: 12/21/2017; Time: 1:59 PM  Pain Score Disclaimer: We use the NRS-11 scale. This is a self-reported, subjective measurement of pain severity with only modest accuracy. It is used primarily to identify changes within a particular patient. It must be understood that outpatient pain scales are significantly less accurate that those used for research, where they can be applied under ideal controlled circumstances with minimal exposure to variables. In reality, the score is likely to be a combination of pain intensity and pain affect, where pain affect describes the degree of emotional arousal or changes in action readiness caused by the sensory experience of pain. Factors such as social and work situation, setting, emotional state, anxiety levels, expectation, and prior pain experience may influence pain perception and show large inter-individual differences that may also be affected by time variables.  Patient instructions provided during this appointment: Patient Instructions     ____________________________________________________________________________________________  Medication Rules  Applies to: All  patients receiving prescriptions (written or electronic).  Pharmacy of record: Pharmacy where electronic prescriptions will be sent. If written prescriptions are taken to a different pharmacy, please inform the nursing staff. The pharmacy listed in the electronic medical record should be the one where you would like electronic prescriptions to be sent.  Prescription refills: Only during scheduled appointments. Applies to both, written and electronic prescriptions.  NOTE: The following applies primarily to controlled substances (Opioid* Pain Medications).   Patient's responsibilities: 1. Pain Pills: Bring all pain pills to every appointment (except for procedure appointments). 2. Pill Bottles: Bring pills in original pharmacy bottle. Always bring newest bottle. Bring bottle, even if empty. 3. Medication refills: You are responsible for knowing and keeping track of what medications you need refilled. The day before your appointment, write a list of all prescriptions that need to be refilled. Bring that list to your appointment and give it to the admitting nurse. Prescriptions will be written only during appointments. If you forget a medication, it will not be "Called in", "Faxed", or "electronically sent". You will need to get another appointment to get these prescribed. 4. Prescription Accuracy: You are responsible for carefully inspecting your prescriptions before leaving our office. Have the discharge nurse carefully go over each prescription with you, before taking them home. Make sure that your name is accurately spelled, that your address is correct. Check the name and dose of your medication to make sure it is accurate. Check the number of pills, and the written instructions to make sure they are clear and accurate. Make sure that you are given enough medication  to last until your next medication refill appointment. 5. Taking Medication: Take medication as prescribed. Never take more pills than instructed. Never take medication more frequently than prescribed. Taking less pills or less frequently is permitted and encouraged, when it comes to controlled substances (written prescriptions).  6. Inform other Doctors: Always inform, all of your healthcare providers, of all the medications you take. 7. Pain Medication from other Providers: You are not allowed to accept any additional pain medication from any other Doctor or Healthcare provider. There are two exceptions to this rule. (see below) In the event that you require additional pain medication, you are responsible for notifying us, as stated below. 8. Medication Agreement: You are responsible for carefully reading and following our Medication Agreement. This must be signed before receiving any prescriptions from our practice. Safely store a copy of your signed Agreement. Violations to the Agreement will result in no further prescriptions. (Additional copies of our Medication Agreement are available upon request.) 9. Laws, Rules, & Regulations: All patients are expected to follow all Federal and [employer], [employer], Rules, [employer]. Ignorance of the Laws does not constitute a valid excuse. The use of any illegal substances is prohibited. 10. Adopted CDC guidelines & recommendations: Target dosing levels will be at or below 60 MME/day. Use of benzodiazepines** is not recommended.  Exceptions: There are only two exceptions to the rule of not receiving pain medications from other Healthcare Providers. 1. Exception #1 (Emergencies): In the event of an emergency (i.e.: accident requiring emergency care), you are allowed to receive additional pain medication. However, you are responsible for: As soon as you are able, call our office [phone], at any time of the day or night, and leave a message stating your  name, the date and nature of the emergency, and the name and dose of the medication prescribed. In the event that your call is  answered by a member of our staff, make sure to document and save the date, time, and the name of the person that took your information.  2. Exception #2 (Planned Surgery): In the event that you are scheduled by another doctor or dentist to have any type of surgery or procedure, you are allowed (for a period no longer than 30 days), to receive additional pain medication, for the acute post-op pain. However, in this case, you are responsible for picking up a copy of our "Post-op Pain Management for Surgeons" handout, and giving it to your surgeon or dentist. This document is available at our office, and does not require an appointment to obtain it. Simply go to our office during business hours (Monday-Thursday from 8:00 AM to 4:00 PM) (Friday 8:00 AM to 12:00 Noon) or if you have a scheduled appointment with [place], prior to your surgery, and ask for it by name. In addition, you will need to provide [place] with your name, name of your surgeon, type of surgery, and date of procedure or surgery.  *Opioid medications include: morphine, codeine, oxycodone, oxymorphone, hydrocodone, hydromorphone, meperidine, tramadol, tapentadol, buprenorphine, fentanyl, methadone. **Benzodiazepine medications include: diazepam (Valium), alprazolam (Xanax), clonazepam (Klonopine), lorazepam (Ativan), clorazepate (Tranxene), chlordiazepoxide (Librium), estazolam (Prosom), oxazepam (Serax), temazepam (Restoril), triazolam (Halcion)  ____________________________________________________________________________________________   BMI Assessment: Estimated body mass index is 32.58 kg/m as calculated from the following:   Height as of this encounter: _0  (1.702 m).   Weight as of this encounter: 208 lb (94.3 kg).  BMI interpretation table: BMI level Category Range association with higher incidence of chronic  pain  <18 kg/m2 Underweight   18.5-24.9 kg/m2 Ideal body weight   25-29.9 kg/m2 Overweight Increased incidence by 20%  30-34.9 kg/m2 Obese (Class I) Increased incidence by 68%  35-39.9 kg/m2 Severe obesity (Class II) Increased incidence by 136%  >40 kg/m2 Extreme obesity (Class III) Increased incidence by 254%   BMI Readings from Last 4 Encounters:  12/21/17 32.58 kg/m  09/20/17 33.20 kg/m  06/23/17 33.99 kg/m  03/23/17 33.99 kg/m   Wt Readings from Last 4 Encounters:  12/21/17 208 lb (94.3 kg)  09/20/17 212 lb (96.2 kg)  06/23/17 217 lb (98.4 kg)  03/23/17 217 lb (98.4 kg)

## 2017-12-21 NOTE — Progress Notes (Signed)
Nursing Pain Medication Assessment:  Safety precautions to be maintained throughout the outpatient stay will include: orient to surroundings, keep bed in low position, maintain call bell within reach at all times, provide assistance with transfer out of bed and ambulation.  Medication Inspection Compliance: Pill count conducted under aseptic conditions, in front of the patient. Neither the pills nor the bottle was removed from the patient's sight at any time. Once count was completed pills were immediately returned to the patient in their original bottle.  Medication: See above Pill/Patch Count: 28 of 60 pills remain Pill/Patch Appearance: Markings consistent with prescribed medication Bottle Appearance: Standard pharmacy container. Clearly labeled. Filled Date: 2 / 4 / 2019 Last Medication intake:  Today

## 2017-12-21 NOTE — Patient Instructions (Addendum)
____________________________________________________________________________________________  Medication Rules  Applies to: All patients receiving prescriptions (written or electronic).  Pharmacy of record: Pharmacy where electronic prescriptions will be sent. If written prescriptions are taken to a different pharmacy, please inform the nursing staff. The pharmacy listed in the electronic medical record should be the one where you would like electronic prescriptions to be sent.  Prescription refills: Only during scheduled appointments. Applies to both, written and electronic prescriptions.  NOTE: The following applies primarily to controlled substances (Opioid* Pain Medications).   Patient's responsibilities: 1. Pain Pills: Bring all pain pills to every appointment (except for procedure appointments). 2. Pill Bottles: Bring pills in original pharmacy bottle. Always bring newest bottle. Bring bottle, even if empty. 3. Medication refills: You are responsible for knowing and keeping track of what medications you need refilled. The day before your appointment, write a list of all prescriptions that need to be refilled. Bring that list to your appointment and give it to the admitting nurse. Prescriptions will be written only during appointments. If you forget a medication, it will not be "Called in", "Faxed", or "electronically sent". You will need to get another appointment to get these prescribed. 4. Prescription Accuracy: You are responsible for carefully inspecting your prescriptions before leaving our office. Have the discharge nurse carefully go over each prescription with you, before taking them home. Make sure that your name is accurately spelled, that your address is correct. Check the name and dose of your medication to make sure it is accurate. Check the number of pills, and the written instructions to make sure they are clear and accurate. Make sure that you are given enough medication to  last until your next medication refill appointment. 5. Taking Medication: Take medication as prescribed. Never take more pills than instructed. Never take medication more frequently than prescribed. Taking less pills or less frequently is permitted and encouraged, when it comes to controlled substances (written prescriptions).  6. Inform other Doctors: Always inform, all of your healthcare providers, of all the medications you take. 7. Pain Medication from other Providers: You are not allowed to accept any additional pain medication from any other Doctor or Healthcare provider. There are two exceptions to this rule. (see below) In the event that you require additional pain medication, you are responsible for notifying us, as stated below. 8. Medication Agreement: You are responsible for carefully reading and following our Medication Agreement. This must be signed before receiving any prescriptions from our practice. Safely store a copy of your signed Agreement. Violations to the Agreement will result in no further prescriptions. (Additional copies of our Medication Agreement are available upon request.) 9. Laws, Rules, & Regulations: All patients are expected to follow all Federal and State Laws, Statutes, Rules, & Regulations. Ignorance of the Laws does not constitute a valid excuse. The use of any illegal substances is prohibited. 10. Adopted CDC guidelines & recommendations: Target dosing levels will be at or below 60 MME/day. Use of benzodiazepines** is not recommended.  Exceptions: There are only two exceptions to the rule of not receiving pain medications from other Healthcare Providers. 1. Exception #1 (Emergencies): In the event of an emergency (i.e.: accident requiring emergency care), you are allowed to receive additional pain medication. However, you are responsible for: As soon as you are able, call our office [phone], at any time of the day or night, and leave a message stating your  name, the date and nature of the emergency, and the name and dose of the medication   prescribed. In the event that your call is answered by a member of our staff, make sure to document and save the date, time, and the name of the person that took your information.  2. Exception #2 (Planned Surgery): In the event that you are scheduled by another doctor or dentist to have any type of surgery or procedure, you are allowed (for a period no longer than 30 days), to receive additional pain medication, for the acute post-op pain. However, in this case, you are responsible for picking up a copy of our "Post-op Pain Management for Surgeons" handout, and giving it to your surgeon or dentist. This document is available at our office, and does not require an appointment to obtain it. Simply go to our office during business hours (Monday-Thursday from 8:00 AM to 4:00 PM) (Friday 8:00 AM to 12:00 Noon) or if you have a scheduled appointment with [place], prior to your surgery, and ask for it by name. In addition, you will need to provide [place] with your name, name of your surgeon, type of surgery, and date of procedure or surgery.  *Opioid medications include: morphine, codeine, oxycodone, oxymorphone, hydrocodone, hydromorphone, meperidine, tramadol, tapentadol, buprenorphine, fentanyl, methadone. **Benzodiazepine medications include: diazepam (Valium), alprazolam (Xanax), clonazepam (Klonopine), lorazepam (Ativan), clorazepate (Tranxene), chlordiazepoxide (Librium), estazolam (Prosom), oxazepam (Serax), temazepam (Restoril), triazolam (Halcion)  ____________________________________________________________________________________________   BMI Assessment: Estimated body mass index is 32.58 kg/m as calculated from the following:   Height as of this encounter: 5\' 7"  (1.702 m).   Weight as of this encounter: 208 lb (94.3 kg).  BMI interpretation table: BMI level Category Range association with higher incidence of chronic  pain  <18 kg/m2 Underweight   18.5-24.9 kg/m2 Ideal body weight   25-29.9 kg/m2 Overweight Increased incidence by 20%  30-34.9 kg/m2 Obese (Class I) Increased incidence by 68%  35-39.9 kg/m2 Severe obesity (Class II) Increased incidence by 136%  >40 kg/m2 Extreme obesity (Class III) Increased incidence by 254%   BMI Readings from Last 4 Encounters:  12/21/17 32.58 kg/m  09/20/17 33.20 kg/m  06/23/17 33.99 kg/m  03/23/17 33.99 kg/m   Wt Readings from Last 4 Encounters:  12/21/17 208 lb (94.3 kg)  09/20/17 212 lb (96.2 kg)  06/23/17 217 lb (98.4 kg)  03/23/17 217 lb (98.4 kg)

## 2017-12-28 NOTE — Progress Notes (Signed)
Results were reviewed and found to be: mildly abnormal  No acute injury or pathology identified  Review would suggest interventional pain management techniques may be of benefit 

## 2017-12-28 NOTE — Progress Notes (Signed)
  Please review; right and left knee.  Will pt benefit from interventional therapy or do should MRI be ordered.

## 2018-01-11 ENCOUNTER — Ambulatory Visit: Payer: Medicare Other | Admitting: Pain Medicine

## 2018-02-08 NOTE — Progress Notes (Signed)
Results were reviewed and found to be: abnormal  No acute injury or pathology identified  Review would suggest interventional pain management techniques may be of benefit

## 2018-03-14 ENCOUNTER — Encounter: Payer: Self-pay | Admitting: Nurse Practitioner

## 2018-03-16 ENCOUNTER — Ambulatory Visit: Payer: No Typology Code available for payment source | Attending: Nurse Practitioner | Admitting: Nurse Practitioner

## 2018-03-16 ENCOUNTER — Encounter: Payer: Self-pay | Admitting: Nurse Practitioner

## 2018-03-16 ENCOUNTER — Other Ambulatory Visit: Payer: Self-pay

## 2018-03-16 VITALS — BP 112/68 | HR 56 | Temp 97.6°F | Resp 16 | Ht 67.0 in | Wt 203.0 lb

## 2018-03-16 DIAGNOSIS — M47816 Spondylosis without myelopathy or radiculopathy, lumbar region: Secondary | ICD-10-CM | POA: Insufficient documentation

## 2018-03-16 DIAGNOSIS — M4316 Spondylolisthesis, lumbar region: Secondary | ICD-10-CM | POA: Diagnosis not present

## 2018-03-16 DIAGNOSIS — E559 Vitamin D deficiency, unspecified: Secondary | ICD-10-CM | POA: Diagnosis not present

## 2018-03-16 DIAGNOSIS — G8929 Other chronic pain: Secondary | ICD-10-CM

## 2018-03-16 DIAGNOSIS — M160 Bilateral primary osteoarthritis of hip: Secondary | ICD-10-CM | POA: Insufficient documentation

## 2018-03-16 DIAGNOSIS — M62830 Muscle spasm of back: Secondary | ICD-10-CM

## 2018-03-16 DIAGNOSIS — Z79891 Long term (current) use of opiate analgesic: Secondary | ICD-10-CM | POA: Insufficient documentation

## 2018-03-16 DIAGNOSIS — G4700 Insomnia, unspecified: Secondary | ICD-10-CM | POA: Diagnosis not present

## 2018-03-16 DIAGNOSIS — M7918 Myalgia, other site: Secondary | ICD-10-CM | POA: Diagnosis not present

## 2018-03-16 DIAGNOSIS — M4326 Fusion of spine, lumbar region: Secondary | ICD-10-CM | POA: Insufficient documentation

## 2018-03-16 DIAGNOSIS — G9619 Other disorders of meninges, not elsewhere classified: Secondary | ICD-10-CM | POA: Diagnosis not present

## 2018-03-16 DIAGNOSIS — E538 Deficiency of other specified B group vitamins: Secondary | ICD-10-CM | POA: Diagnosis not present

## 2018-03-16 DIAGNOSIS — G894 Chronic pain syndrome: Secondary | ICD-10-CM | POA: Insufficient documentation

## 2018-03-16 DIAGNOSIS — M25561 Pain in right knee: Secondary | ICD-10-CM | POA: Diagnosis not present

## 2018-03-16 DIAGNOSIS — M792 Neuralgia and neuritis, unspecified: Secondary | ICD-10-CM | POA: Diagnosis not present

## 2018-03-16 DIAGNOSIS — Z888 Allergy status to other drugs, medicaments and biological substances status: Secondary | ICD-10-CM | POA: Insufficient documentation

## 2018-03-16 DIAGNOSIS — M62838 Other muscle spasm: Secondary | ICD-10-CM | POA: Diagnosis not present

## 2018-03-16 DIAGNOSIS — G4701 Insomnia due to medical condition: Secondary | ICD-10-CM

## 2018-03-16 DIAGNOSIS — M48061 Spinal stenosis, lumbar region without neurogenic claudication: Secondary | ICD-10-CM | POA: Diagnosis not present

## 2018-03-16 DIAGNOSIS — M23206 Derangement of unspecified meniscus due to old tear or injury, right knee: Secondary | ICD-10-CM | POA: Insufficient documentation

## 2018-03-16 DIAGNOSIS — Z79899 Other long term (current) drug therapy: Secondary | ICD-10-CM | POA: Insufficient documentation

## 2018-03-16 DIAGNOSIS — M170 Bilateral primary osteoarthritis of knee: Secondary | ICD-10-CM | POA: Diagnosis not present

## 2018-03-16 DIAGNOSIS — G4730 Sleep apnea, unspecified: Secondary | ICD-10-CM | POA: Insufficient documentation

## 2018-03-16 DIAGNOSIS — M25562 Pain in left knee: Secondary | ICD-10-CM | POA: Diagnosis not present

## 2018-03-16 MED ORDER — GABAPENTIN 300 MG PO CAPS: 300.0000 mg | ORAL_CAPSULE | Freq: Four times a day (QID) | ORAL | 2 refills | Status: DC

## 2018-03-16 MED ORDER — TRIAMCINOLONE ACETONIDE 0.1 % EX OINT: TOPICAL_OINTMENT | Freq: Every evening | CUTANEOUS | 2 refills | Status: DC

## 2018-03-16 MED ORDER — HYDROCODONE-ACETAMINOPHEN 10-325 MG PO TABS: 1.0000 | ORAL_TABLET | Freq: Two times a day (BID) | ORAL | 0 refills | Status: DC | PRN

## 2018-03-16 MED ORDER — CYCLOBENZAPRINE HCL 10 MG PO TABS: 10.0000 mg | ORAL_TABLET | Freq: Every day | ORAL | 0 refills | Status: DC

## 2018-03-16 MED ORDER — MELOXICAM 15 MG PO TABS: 15.0000 mg | ORAL_TABLET | Freq: Every morning | ORAL | 0 refills | Status: DC

## 2018-03-16 NOTE — Progress Notes (Signed)
Nursing Pain Medication Assessment:  Safety precautions to be maintained throughout the outpatient stay will include: orient to surroundings, keep bed in low position, maintain call bell within reach at all times, provide assistance with transfer out of bed and ambulation.   Medication Inspection Compliance: Pill count conducted under aseptic conditions, in front of the patient. Neither the pills nor the bottle was removed from the patient's sight at any time. Once count was completed pills were immediately returned to the patient in their original bottle.  Medication: Hydrocodone/APAP Pill/Patch Count: 36 of 45 pills remain  Pill/Patch Appearance: Markings consistent with prescribed medication Bottle Appearance: Standard pharmacy container. Clearly labeled. Filled Date: 05/ 05 / 2019  Last Medication intake:  03/15/2018  Safety precautions to be maintained throughout the outpatient stay will include: orient to surroundings, keep bed in low position, maintain call bell within reach at all times, provide assistance with transfer out of bed and ambulation.

## 2018-03-16 NOTE — Progress Notes (Signed)
Patient's Name: Stephanie [name]  MRN: [id number]  Referring Provider: [facility]  DOB: [birth year]  PCP: [facility]  DOS: 03/16/2018  Note by: Vevelyn Francois NP  Service setting: Ambulatory outpatient  Specialty: Interventional Pain Management  Location: [facility] (AMB) Pain Management Facility    Patient type: Established    Primary Reason(s) for Visit: Encounter for prescription drug management. (Level of risk: moderate)  CC: Medication Refill (Hydrocodone ) and Leg Pain  HPI  [name] is a 57 y.o. year old, female patient, who comes today for a medication management evaluation. 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); B12 deficiency; Lumbar spondylosis; Chronic back pain; Epidural fibrosis; Spondylolisthesis of lumbar region; Sleep apnea; Vitamin D deficiency; Sleep disturbance; Insomnia secondary to chronic pain; Chronic lower extremity pain (Location of Secondary source of pain) (Bilateral) (L>R); Chronic lumbar radicular pain (Right) (S1); 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 Her primarily concern today is the Medication Refill (Hydrocodone )  and Leg Pain  Pain Assessment: Location: Right, Left Leg Radiating: From hips to feet bilateral. pain also in the back but tat this time the legs feel worse.  Onset: More than a month ago Duration: Chronic pain Quality: Cramping, Constant, Heaviness, Tightness Severity: 4 /10 (subjective, self-reported pain score)  Note: Reported level is compatible with observation.                          Effect on ADL: Walking and sitting is more difficult.  Resting makes pain worse.  Timing: Constant Modifying factors: Medications  BP: 112/68  HR: (!) 56  [name] was last scheduled for an appointment on 12/21/2017 for medication management. During today's appointment we reviewed [name]'s chronic pain status, as well as her outpatient medication regimen.  She states that her leg pain has gotten worse. She feels like gabapentin is beneficial`. She admits that she is taking the 970m 3-4 times per day. She is SP bilateral knee xray and she has degenerative narrowing with spurring greater on the right. . She has failed the steriod injections in the past. She admits that she has to sometimes switch legs with driving. She denies one side being worse than the other. She does having swelling which is worse by the end of the day. She has weakness. She admits that the right leg will lock up on her. She is willing to try any treatment for relief.   The patient  reports that she does not use drugs. Her body mass index is 31.79 kg/m.  Further details on both, my assessment(s), as well as the proposed treatment plan, please see below.  Controlled Substance Pharmacotherapy Assessment REMS (Risk Evaluation and Mitigation Strategy)  Analgesic: Hydrocodone/acetaminophen 10/325 mg twice daily MME/day:20 mg/day.    RJanne Napoleon RN  03/16/2018  8:16 AM  Sign at close encounter Nursing Pain Medication Assessment:  Safety precautions to be maintained throughout the outpatient stay will include: orient to  surroundings, keep bed in low position, maintain call bell within reach at all times, provide assistance with transfer out of bed and ambulation.   Medication Inspection Compliance: Pill count conducted under aseptic conditions, in front of the patient. Neither the pills nor the bottle was removed from the patient's sight at any time. Once count was completed pills were immediately returned to the patient in their original bottle.  Medication: Hydrocodone/APAP Pill/Patch Count: 36 of 45 pills remain  Pill/Patch Appearance: Markings consistent with prescribed medication Bottle Appearance: Standard pharmacy container. Clearly labeled. Filled Date: 05/ 05 / 2019  Last Medication intake:  03/15/2018  Safety precautions to be maintained throughout the outpatient stay will include: orient to surroundings, keep bed in low position, maintain call bell within reach at all times, provide assistance with transfer out of bed and ambulation.    Pharmacokinetics: Liberation and absorption (onset of action): WNL Distribution (time to peak effect): WNL Metabolism and excretion (duration of action): WNL         Pharmacodynamics: Desired effects: Analgesia: [name] reports >50% benefit. Functional ability: Patient reports that medication allows her to accomplish basic ADLs Clinically meaningful improvement in function (CMIF): Sustained CMIF goals met Perceived effectiveness: Described as relatively effective, allowing for increase in activities of daily living (ADL) Undesirable effects: Side-effects or Adverse reactions: None reported Monitoring: [place] PMP: Online review of the past 32-monthperiod conducted. Compliant with practice rules and regulations Last UDS on record: Summary  Date Value Ref Range Status  06/23/2017 FINAL  Final    Comment:    ==================================================================== TOXASSURE SELECT 13  (MW) ==================================================================== Test                             Result       Flag       Units Drug Present not Declared for Prescription Verification   Oxycodone                      1513         UNEXPECTED ng/mg creat   Oxymorphone                    1010         UNEXPECTED ng/mg creat   Noroxycodone                   2871         UNEXPECTED ng/mg creat   Noroxymorphone                 409          UNEXPECTED ng/mg creat    Sources of oxycodone are scheduled prescription medications.    Oxymorphone, noroxycodone, and noroxymorphone are expected    metabolites of oxycodone. Oxymorphone is also available as a    scheduled prescription medication. Drug Absent but Declared for Prescription Verification   Hydrocodone                    Not Detected UNEXPECTED ng/mg creat ==================================================================== Test                      Result    Flag   Units      Ref Range  Creatinine              68               mg/dL      >=20 ==================================================================== Declared Medications:  The flagging and interpretation on this report are based on the  following declared medications.  Unexpected results may arise from  inaccuracies in the declared medications.  **Note: The testing scope of this panel includes these medications:  Hydrocodone (Norco)  **Note: The testing scope of this panel does not include following  reported medications:  Acetaminophen (Norco)  Cyclobenzaprine (Flexeril)  Gabapentin  Melatonin  Meloxicam (Mobic)  Trazodone  Triamcinolone (Kenalog) ==================================================================== For clinical consultation, please call [phone]. ====================================================================    UDS interpretation: Compliant          Medication Assessment Form: Reviewed. Patient indicates being compliant with  therapy Treatment compliance: Compliant Risk Assessment Profile: Aberrant behavior: See prior evaluations. None observed or detected today Comorbid factors increasing risk of overdose: See prior notes. No additional risks detected today Risk of substance use disorder (SUD): Low Opioid Risk Tool - 03/16/18 0813      Family History of Substance Abuse   Alcohol  Negative    Illegal Drugs  Negative    Rx Drugs  Negative      Personal History of Substance Abuse   Alcohol  Negative    Illegal Drugs  Negative    Rx Drugs  Negative      Age   Age between 74-45 years   No      History of Preadolescent Sexual Abuse   History of Preadolescent Sexual Abuse  Negative or Female      Psychological Disease   Psychological Disease  Negative    Depression  Negative      Total Score   Opioid Risk Tool Scoring  0    Opioid Risk Interpretation  Low Risk      ORT Scoring interpretation table:  Score <3 = Low Risk for SUD  Score between 4-7 = Moderate Risk for SUD  Score >8 = High Risk for Opioid Abuse   Risk Mitigation Strategies:  Patient Counseling: Covered Patient-Prescriber Agreement (PPA): Present and active  Notification to other healthcare providers: Done  Pharmacologic Plan: No change in therapy, at this time.             Laboratory Chemistry  Inflammation Markers (CRP: Acute Phase) (ESR: Chronic Phase) Lab Results  Component Value Date   CRP 0.7 11/18/2015   ESRSEDRATE 27 11/18/2015                         Rheumatology Markers No results found for: RF, ANA, Therisa D[place], [facility]                      Renal Function Markers Lab Results  Component Value Date   BUN 8 06/23/2017   CREATININE 0.59 06/23/2017   GFRAA >60 06/23/2017   GFRNONAA >60 06/23/2017                              Hepatic Function Markers Lab Results  Component Value Date   AST 17 06/23/2017   ALT 13 (L) 06/23/2017   ALBUMIN 4.2 06/23/2017   ALKPHOS 60 06/23/2017  Electrolytes Lab Results  Component Value Date   NA 142 06/23/2017   K 4.0 06/23/2017   CL 107 06/23/2017   CALCIUM 9.4 06/23/2017   MG 2.0 06/23/2017                        Neuropathy Markers Lab Results  Component Value Date   VITAMINB12 271 06/23/2017   HGBA1C 6.2 (H) 04/10/2014                        Bone Pathology Markers Lab Results  Component Value Date   25OHVITD1 34 05/07/2016   25OHVITD2 <1.0 05/07/2016   25OHVITD3 34 05/07/2016                         Coagulation Parameters Lab Results  Component Value Date   PLT 298 04/10/2014                        Cardiovascular Markers Lab Results  Component Value Date   TROPONINI <0.30 04/10/2014   HGB 8.7 (L) 04/10/2014   HCT 28.6 (L) 04/10/2014                         CA Markers No results found for: CEA, CA125, LABCA2                      Note: Lab results reviewed.  Recent Diagnostic Imaging Results  DG Knee 1-2 Views Right CLINICAL DATA:  Knee pain.  EXAM: RIGHT KNEE - 1-2 VIEW  COMPARISON:  MRI of the right knee 01/17/2014  FINDINGS: Generalized degenerative spurring with medial compartment narrowing. No evidence of fracture or bone lesion. There is posterior translation of the tibial plateau relative to the distal femur. Negative for joint effusion.  IMPRESSION: 1. 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 2. Posterior translation of the tibial plateau relative to the distal femur, please correlate with drawer test.  Electronically Signed   By: Monte Fantasia M.D.   On: 12/21/2017 17:04 DG Knee 1-2 Views Left CLINICAL DATA:  Bilateral knee pain.  EXAM: LEFT KNEE - 1-2 VIEW  COMPARISON:  MRI of the left knee 01/07/2014  FINDINGS: Degenerative marginal spurring with moderate to advanced medial compartment narrowing. No fracture deformity or evidence of bone lesion. Negative for joint effusion.  IMPRESSION: Tricompartmental degenerative  spurring with medial compartment narrowing.  Electronically Signed   By: Monte Fantasia M.D.   On: 12/21/2017 17:02  Complexity Note: Imaging results reviewed. Results shared with [name], using Layman's terms.                         Meds   Current Outpatient Medications:  .  [facility] MELATONIN 10 MG CAPS, Take 10-20 mg by mouth at bedtime., Disp: 60 capsule, Rfl: 2 .  [START ON 04/05/2018] cyclobenzaprine (FLEXERIL) 10 MG tablet, Take 1 tablet (10 mg total) by mouth daily., Disp: 90 tablet, Rfl: 0 .  [START ON 04/05/2018] gabapentin (NEURONTIN) 300 MG capsule, Take 1-3 capsules (300-900 mg total) by mouth 4 (four) times daily., Disp: 360 capsule, Rfl: 2 .  [START ON 06/04/2018] HYDROcodone-acetaminophen (NORCO) 10-325 MG tablet, Take 1 tablet by mouth every 12 (twelve) hours as needed for severe pain., Disp: 45 tablet, Rfl: 0 .  [START ON 04/05/2018] meloxicam (MOBIC) 15 MG tablet,  Take 1 tablet (15 mg total) by mouth every morning., Disp: 90 tablet, Rfl: 0 .  traZODone (DESYREL) 100 MG tablet, Take 0.5 tablets (50 mg total) by mouth at bedtime as needed for sleep., Disp: 45 tablet, Rfl: 0 .  [START ON 04/05/2018] triamcinolone ointment (KENALOG) 0.1 %, Apply topically Nightly., Disp: 30 g, Rfl: 2 .  [START ON 05/05/2018] HYDROcodone-acetaminophen (NORCO) 10-325 MG tablet, Take 1 tablet by mouth every 12 (twelve) hours as needed for severe pain., Disp: 45 tablet, Rfl: 0 .  [START ON 04/05/2018] HYDROcodone-acetaminophen (NORCO) 10-325 MG tablet, Take 1 tablet by mouth every 12 (twelve) hours as needed for severe pain., Disp: 45 tablet, Rfl: 0  ROS  Constitutional: Denies any fever or chills Gastrointestinal: No reported hemesis, hematochezia, vomiting, or acute GI distress Musculoskeletal: Denies any acute onset joint swelling, redness, loss of ROM, or weakness Neurological: No reported episodes of acute onset apraxia, aphasia, dysarthria, agnosia, amnesia, paralysis, loss of coordination, or loss of  consciousness  Allergies  [name] is allergic to phenergan [promethazine hcl].  PFSH  Drug: [name]  reports that she does not use drugs. Alcohol:  reports that she does not drink alcohol. Tobacco:  reports that she has never smoked. She has never used smokeless tobacco. Medical:  has a past medical history of Allergy, Anemia, iron deficiency (04/09/2014), Arthritis, Atypical chest pain (04/09/2014), CD (contact dermatitis) (03/01/2014), Chest pain (04/09/2014), and Lumbar radicular pain (08/22/2015). Surgical: [name]  has a past surgical history that includes Back surgery; Appendectomy; Cholecystectomy; Cesarean section; and Bunionectomy. Family: family history includes Asthma in her father; Stroke in her mother.  Constitutional Exam  General appearance: Well nourished, well developed, and well hydrated. In no apparent acute distress Vitals:   03/16/18 0806  BP: 112/68  Pulse: (!) 56  Resp: 16  Temp: 97.6 F (36.4 C)  SpO2: 99%  Weight: 203 lb (92.1 kg)  Height: 5' 7"  (1.702 m)  Psych/Mental status: Alert, oriented x 3 (person, place, & time)       Eyes: PERLA Respiratory: No evidence of acute respiratory distress  Lumbar Spine Area Exam  Skin & Axial Inspection: No masses, redness, or swelling Alignment: Symmetrical Functional ROM: Unrestricted ROM       Stability: No instability detected Muscle Tone/Strength: Functionally intact. No obvious neuro-muscular anomalies detected. Sensory (Neurological): Unimpaired Palpation: No palpable anomalies       Provocative Tests: Lumbar Hyperextension and rotation test: evaluation deferred today       Lumbar Lateral bending test: evaluation deferred today       Patrick's Maneuver: evaluation deferred today                    Gait & Posture Assessment  Ambulation: Unassisted Gait: Relatively normal for age and body habitus Posture: WNL   Lower Extremity Exam    Side: Right lower extremity  Side: Left lower extremity   Stability: No instability observed          Stability: No instability observed          Skin & Extremity Inspection: Edema Genu varum   Skin & Extremity Inspection: Edema Genu varum   Functional ROM: Adequate ROM                  Functional ROM: Adequate ROM                  Muscle Tone/Strength: Functionally intact. No obvious neuro-muscular anomalies detected.  Muscle Tone/Strength: Functionally  intact. No obvious neuro-muscular anomalies detected.  Sensory (Neurological): Unimpaired  Sensory (Neurological): Unimpaired  Palpation: No palpable anomalies  Palpation: No palpable anomalies   Assessment  Primary Diagnosis & Pertinent Problem List: The primary encounter diagnosis was Primary osteoarthritis of both knees. Diagnoses of Spondylolisthesis of lumbar region, Chronic pain of both knees, Chronic pain syndrome, Insomnia secondary to chronic pain, Musculoskeletal pain, Spasm of paraspinal muscle, Neurogenic pain, and Long term prescription opiate use were also pertinent to this visit.  Status Diagnosis  Worsening Controlled Worsening 1. Primary osteoarthritis of both knees   2. Spondylolisthesis of lumbar region   3. Chronic pain of both knees   4. Chronic pain syndrome   5. Insomnia secondary to chronic pain   6. Musculoskeletal pain   7. Spasm of paraspinal muscle   8. Neurogenic pain   9. Long term prescription opiate use     Problems updated and reviewed during this visit: Problem  Chronic Back Pain  Vitamin D Deficiency   Plan of Care  Pharmacotherapy (Medications Ordered): Meds ordered this encounter  Medications  . HYDROcodone-acetaminophen (NORCO) 10-325 MG tablet    Sig: Take 1 tablet by mouth every 12 (twelve) hours as needed for severe pain.    Dispense:  45 tablet    Refill:  0    Do not place this medication, or any other prescription from our practice, on "Automatic Refill". Patient may have prescription filled one day early if pharmacy is closed on  scheduled refill date. Do not fill until:06/04/2018 To last until: 07/04/2018    Order Specific Question:   Supervising Provider    Answer:   Milinda Pointer [phone]  . cyclobenzaprine (FLEXERIL) 10 MG tablet    Sig: Take 1 tablet (10 mg total) by mouth daily.    Dispense:  90 tablet    Refill:  0    Do not place this medication, or any other prescription from our practice, on "Automatic Refill". Patient may have prescription filled one day early if pharmacy is closed on scheduled refill date.    Order Specific Question:   Supervising Provider    Answer:   Milinda Pointer [phone]  . gabapentin (NEURONTIN) 300 MG capsule    Sig: Take 1-3 capsules (300-900 mg total) by mouth 4 (four) times daily.    Dispense:  360 capsule    Refill:  2    Do not place this medication, or any other prescription from our practice, on "Automatic Refill". Patient may have prescription filled one day early if pharmacy is closed on scheduled refill date.    Order Specific Question:   Supervising Provider    Answer:   Milinda Pointer [phone]  . meloxicam (MOBIC) 15 MG tablet    Sig: Take 1 tablet (15 mg total) by mouth every morning.    Dispense:  90 tablet    Refill:  0    Do not place this medication, or any other prescription from our practice, on "Automatic Refill". Patient may have prescription filled one day early if pharmacy is closed on scheduled refill date.    Order Specific Question:   Supervising Provider    Answer:   Milinda Pointer [phone]  . triamcinolone ointment (KENALOG) 0.1 %    Sig: Apply topically Nightly.    Dispense:  30 g    Refill:  2    Order Specific Question:   Supervising Provider    Answer:   Milinda Pointer [phone]  . HYDROcodone-acetaminophen (NORCO) 10-325 MG tablet  Sig: Take 1 tablet by mouth every 12 (twelve) hours as needed for severe pain.    Dispense:  45 tablet    Refill:  0    Do not place this medication, or any other prescription from our  practice, on "Automatic Refill". Patient may have prescription filled one day early if pharmacy is closed on scheduled refill date. Do not fill until: 05/05/2018 To last until: 06/04/2018    Order Specific Question:   Supervising Provider    Answer:   Milinda Pointer [phone]  . HYDROcodone-acetaminophen (NORCO) 10-325 MG tablet    Sig: Take 1 tablet by mouth every 12 (twelve) hours as needed for severe pain.    Dispense:  45 tablet    Refill:  0    Do not place this medication, or any other prescription from our practice, on "Automatic Refill". Patient may have prescription filled one day early if pharmacy is closed on scheduled refill date. Do not fill until: 04/05/2018 To last until: 05/05/2018    Order Specific Question:   Supervising Provider    Answer:   Milinda Pointer [phone]   New Prescriptions   No medications on file   Medications administered today: Cathrine Krizan. Thacker had no medications administered during this visit. Lab-work, procedure(s), and/or referral(s): Orders Placed This Encounter  Procedures  . KNEE INJECTION  . MR KNEE LEFT WO CONTRAST  . MR KNEE RIGHT WO CONTRAST  . ToxASSURE Select 13 (MW), Urine   Imaging and/or referral(s): MR KNEE LEFT WO CONTRAST MR KNEE RIGHT WO CONTRAST  Interventional therapies: Planned, scheduled, and/or pending: Bilateral Hyalgan series knee injections (#1)   Considering:  Diagnostic bilateral lumbar facet block Possible bilateral lumbar facet RFA Diagnostic Caudal ESI Diagnostic Bilateral L4-5 & L5-S1Lumbar TFESI Diagnostic bilateral sacroiliac joint block Possible bilateral sacroiliac joint RFA Diagnostic bilateral hip joint injection Possible bilateral hip joint RFA Diagnostic bilateral knee joint injection Possible series of Hyalgan knee injections Diagnostic bilateral genicular nerve block Possible bilateral genicular nerve RFA.   Palliative PRN treatment(s):  PalliativeCaudal ESI       Provider-requested follow-up: Return in about 3 months (around 06/16/2018) for MedMgmt with Me Dionisio David), MRI, in addition, Procedure(NS), w/ Dr. Dossie Arbour.  No future appointments. Primary Care Physician: [facility] Location: [facility] Note by: Vevelyn Francois NP Date: 03/16/2018; Time: 9:08 AM  Pain Score Disclaimer: We use the NRS-11 scale. This is a self-reported, subjective measurement of pain severity with only modest accuracy. It is used primarily to identify changes within a particular patient. It must be understood that outpatient pain scales are significantly less accurate that those used for research, where they can be applied under ideal controlled circumstances with minimal exposure to variables. In reality, the score is likely to be a combination of pain intensity and pain affect, where pain affect describes the degree of emotional arousal or changes in action readiness caused by the sensory experience of pain. Factors such as social and work situation, setting, emotional state, anxiety levels, expectation, and prior pain experience may influence pain perception and show large inter-individual differences that may also be affected by time variables.  Patient instructions provided during this appointment: Patient Instructions   ___You have been given prescription here for hydrocodone to last until 9.2.2019. You have been given prescriptions to your pharmacy as well. _________________________________________________________________________________________  Medication Rules  Applies to: All patients receiving prescriptions (written or electronic).  Pharmacy of record: Pharmacy where electronic prescriptions will be sent.  If written prescriptions are taken to a different pharmacy, please inform the nursing staff. The pharmacy listed in the electronic medical record should be the one where you would like electronic prescriptions to  be sent.  Prescription refills: Only during scheduled appointments. Applies to both, written and electronic prescriptions.  NOTE: The following applies primarily to controlled substances (Opioid* Pain Medications).   Patient's responsibilities: 1. Pain Pills: Bring all pain pills to every appointment (except for procedure appointments). 2. Pill Bottles: Bring pills in original pharmacy bottle. Always bring newest bottle. Bring bottle, even if empty. 3. Medication refills: You are responsible for knowing and keeping track of what medications you need refilled. The day before your appointment, write a list of all prescriptions that need to be refilled. Bring that list to your appointment and give it to the admitting nurse. Prescriptions will be written only during appointments. If you forget a medication, it will not be "Called in", "Faxed", or "electronically sent". You will need to get another appointment to get these prescribed. 4. Prescription Accuracy: You are responsible for carefully inspecting your prescriptions before leaving our office. Have the discharge nurse carefully go over each prescription with you, before taking them home. Make sure that your name is accurately spelled, that your address is correct. Check the name and dose of your medication to make sure it is accurate. Check the number of pills, and the written instructions to make sure they are clear and accurate. Make sure that you are given enough medication to last until your next medication refill appointment. 5. Taking Medication: Take medication as prescribed. Never take more pills than instructed. Never take medication more frequently than prescribed. Taking less pills or less frequently is permitted and encouraged, when it comes to controlled substances (written prescriptions).  6. Inform other Doctors: Always inform, all of your healthcare providers, of all the medications you take. 7. Pain Medication from other Providers: You  are not allowed to accept any additional pain medication from any other Doctor or Healthcare provider. There are two exceptions to this rule. (see below) In the event that you require additional pain medication, you are responsible for notifying us, as stated below. 8. Medication Agreement: You are responsible for carefully reading and following our Medication Agreement. This must be signed before receiving any prescriptions from our practice. Safely store a copy of your signed Agreement. Violations to the Agreement will result in no further prescriptions. (Additional copies of our Medication Agreement are available upon request.) 9. Laws, Rules, & Regulations: All patients are expected to follow all Federal and [employer], [employer], Rules, [employer]. Ignorance of the Laws does not constitute a valid excuse. The use of any illegal substances is prohibited. 10. Adopted CDC guidelines & recommendations: Target dosing levels will be at or below 60 MME/day. Use of benzodiazepines** is not recommended.  Exceptions: There are only two exceptions to the rule of not receiving pain medications from other Healthcare Providers. 1. Exception #1 (Emergencies): In the event of an emergency (i.e.: accident requiring emergency care), you are allowed to receive additional pain medication. However, you are responsible for: As soon as you are able, call our office [phone], at any time of the day or night, and leave a message stating your name, the date and nature of the emergency, and the name and dose of the medication prescribed. In the event that your call is answered by a member of our staff, make sure to document and save the date, time, and  the name of the person that took your information.  2. Exception #2 (Planned Surgery): In the event that you are scheduled by another doctor or dentist to have any type of surgery or procedure, you are allowed (for a period no longer than 30 days), to receive additional pain  medication, for the acute post-op pain. However, in this case, you are responsible for picking up a copy of our "Post-op Pain Management for Surgeons" handout, and giving it to your surgeon or dentist. This document is available at our office, and does not require an appointment to obtain it. Simply go to our office during business hours (Monday-Thursday from 8:00 AM to 4:00 PM) (Friday 8:00 AM to 12:00 Noon) or if you have a scheduled appointment with [place], prior to your surgery, and ask for it by name. In addition, you will need to provide [place] with your name, name of your surgeon, type of surgery, and date of procedure or surgery.  *Opioid medications include: morphine, codeine, oxycodone, oxymorphone, hydrocodone, hydromorphone, meperidine, tramadol, tapentadol, buprenorphine, fentanyl, methadone. **Benzodiazepine medications include: diazepam (Valium), alprazolam (Xanax), clonazepam (Klonopine), lorazepam (Ativan), clorazepate (Tranxene), chlordiazepoxide (Librium), estazolam (Prosom), oxazepam (Serax), temazepam (Restoril), triazolam (Halcion) (Last updated: 12/30/2017) ____________________________________________________________________________________________   BMI Assessment: Estimated body mass index is 31.79 kg/m as calculated from the following:   Height as of this encounter: 5' 7"  (1.702 m).   Weight as of this encounter: 203 lb (92.1 kg).  BMI interpretation table: BMI level Category Range association with higher incidence of chronic pain  <18 kg/m2 Underweight   18.5-24.9 kg/m2 Ideal body weight   25-29.9 kg/m2 Overweight Increased incidence by 20%  30-34.9 kg/m2 Obese (Class I) Increased incidence by 68%  35-39.9 kg/m2 Severe obesity (Class II) Increased incidence by 136%  >40 kg/m2 Extreme obesity (Class III) Increased incidence by 254%   Patient's current BMI Ideal Body weight  Body mass index is 31.79 kg/m. Ideal body weight: 61.6 kg (135 lb 12.9 oz) Adjusted ideal body  weight: 73.8 kg (162 lb 10.9 oz)   BMI Readings from Last 4 Encounters:  03/16/18 31.79 kg/m  12/21/17 32.58 kg/m  09/20/17 33.20 kg/m  06/23/17 33.99 kg/m   Wt Readings from Last 4 Encounters:  03/16/18 203 lb (92.1 kg)  12/21/17 208 lb (94.3 kg)  09/20/17 212 lb (96.2 kg)  06/23/17 217 lb (98.4 kg)

## 2018-03-16 NOTE — Patient Instructions (Addendum)
___You have been given prescription here for hydrocodone to last until 9.2.2019. You have been given prescriptions to your pharmacy as well. _________________________________________________________________________________________  Medication Rules  Applies to: All patients receiving prescriptions (written or electronic).  Pharmacy of record: Pharmacy where electronic prescriptions will be sent. If written prescriptions are taken to a different pharmacy, please inform the nursing staff. The pharmacy listed in the electronic medical record should be the one where you would like electronic prescriptions to be sent.  Prescription refills: Only during scheduled appointments. Applies to both, written and electronic prescriptions.  NOTE: The following applies primarily to controlled substances (Opioid* Pain Medications).   Patient's responsibilities: 1. Pain Pills: Bring all pain pills to every appointment (except for procedure appointments). 2. Pill Bottles: Bring pills in original pharmacy bottle. Always bring newest bottle. Bring bottle, even if empty. 3. Medication refills: You are responsible for knowing and keeping track of what medications you need refilled. The day before your appointment, write a list of all prescriptions that need to be refilled. Bring that list to your appointment and give it to the admitting nurse. Prescriptions will be written only during appointments. If you forget a medication, it will not be "Called in", "Faxed", or "electronically sent". You will need to get another appointment to get these prescribed. 4. Prescription Accuracy: You are responsible for carefully inspecting your prescriptions before leaving our office. Have the discharge nurse carefully go over each prescription with you, before taking them home. Make sure that your name is accurately spelled, that your address is correct. Check the name and dose of your medication to make sure it is accurate. Check the  number of pills, and the written instructions to make sure they are clear and accurate. Make sure that you are given enough medication to last until your next medication refill appointment. 5. Taking Medication: Take medication as prescribed. Never take more pills than instructed. Never take medication more frequently than prescribed. Taking less pills or less frequently is permitted and encouraged, when it comes to controlled substances (written prescriptions).  6. Inform other Doctors: Always inform, all of your healthcare providers, of all the medications you take. 7. Pain Medication from other Providers: You are not allowed to accept any additional pain medication from any other Doctor or Healthcare provider. There are two exceptions to this rule. (see below) In the event that you require additional pain medication, you are responsible for notifying us, as stated below. 8. Medication Agreement: You are responsible for carefully reading and following our Medication Agreement. This must be signed before receiving any prescriptions from our practice. Safely store a copy of your signed Agreement. Violations to the Agreement will result in no further prescriptions. (Additional copies of our Medication Agreement are available upon request.) 9. Laws, Rules, & Regulations: All patients are expected to follow all Federal and [employer], [employer], Rules, [employer]. Ignorance of the Laws does not constitute a valid excuse. The use of any illegal substances is prohibited. 10. Adopted CDC guidelines & recommendations: Target dosing levels will be at or below 60 MME/day. Use of benzodiazepines** is not recommended.  Exceptions: There are only two exceptions to the rule of not receiving pain medications from other Healthcare Providers. 1. Exception #1 (Emergencies): In the event of an emergency (i.e.: accident requiring emergency care), you are allowed to receive additional pain medication. However, you are  responsible for: As soon as you are able, call our office [phone], at any time of the day or night,  and leave a message stating your name, the date and nature of the emergency, and the name and dose of the medication prescribed. In the event that your call is answered by a member of our staff, make sure to document and save the date, time, and the name of the person that took your information.  2. Exception #2 (Planned Surgery): In the event that you are scheduled by another doctor or dentist to have any type of surgery or procedure, you are allowed (for a period no longer than 30 days), to receive additional pain medication, for the acute post-op pain. However, in this case, you are responsible for picking up a copy of our "Post-op Pain Management for Surgeons" handout, and giving it to your surgeon or dentist. This document is available at our office, and does not require an appointment to obtain it. Simply go to our office during business hours (Monday-Thursday from 8:00 AM to 4:00 PM) (Friday 8:00 AM to 12:00 Noon) or if you have a scheduled appointment with [place], prior to your surgery, and ask for it by name. In addition, you will need to provide [place] with your name, name of your surgeon, type of surgery, and date of procedure or surgery.  *Opioid medications include: morphine, codeine, oxycodone, oxymorphone, hydrocodone, hydromorphone, meperidine, tramadol, tapentadol, buprenorphine, fentanyl, methadone. **Benzodiazepine medications include: diazepam (Valium), alprazolam (Xanax), clonazepam (Klonopine), lorazepam (Ativan), clorazepate (Tranxene), chlordiazepoxide (Librium), estazolam (Prosom), oxazepam (Serax), temazepam (Restoril), triazolam (Halcion) (Last updated: 12/30/2017) ____________________________________________________________________________________________   BMI Assessment: Estimated body mass index is 31.79 kg/m as calculated from the following:   Height as of this encounter: 5'  7" (1.702 m).   Weight as of this encounter: 203 lb (92.1 kg).  BMI interpretation table: BMI level Category Range association with higher incidence of chronic pain  <18 kg/m2 Underweight   18.5-24.9 kg/m2 Ideal body weight   25-29.9 kg/m2 Overweight Increased incidence by 20%  30-34.9 kg/m2 Obese (Class I) Increased incidence by 68%  35-39.9 kg/m2 Severe obesity (Class II) Increased incidence by 136%  >40 kg/m2 Extreme obesity (Class III) Increased incidence by 254%   Patient's current BMI Ideal Body weight  Body mass index is 31.79 kg/m. Ideal body weight: 61.6 kg (135 lb 12.9 oz) Adjusted ideal body weight: 73.8 kg (162 lb 10.9 oz)   BMI Readings from Last 4 Encounters:  03/16/18 31.79 kg/m  12/21/17 32.58 kg/m  09/20/17 33.20 kg/m  06/23/17 33.99 kg/m   Wt Readings from Last 4 Encounters:  03/16/18 203 lb (92.1 kg)  12/21/17 208 lb (94.3 kg)  09/20/17 212 lb (96.2 kg)  06/23/17 217 lb (98.4 kg)

## 2018-03-22 LAB — TOXASSURE SELECT 13 (MW), URINE

## 2018-04-06 ENCOUNTER — Other Ambulatory Visit: Payer: Self-pay | Admitting: Pain Medicine

## 2018-05-24 ENCOUNTER — Telehealth: Payer: Self-pay

## 2018-05-24 NOTE — Telephone Encounter (Signed)
Stephanie [name] has the form, will complete it and I will fax it back.

## 2018-05-24 NOTE — Telephone Encounter (Signed)
Previously spoke with Stephanie [name] re medication concerns.  She faxed over a letter on 05/17/18 for Stephanie [name] to fill out with ongoing plan for medication.  What is the status on this? [phone] Fax # [phone]  Please include opioid contract, risk assessment, uds results

## 2018-05-24 NOTE — Telephone Encounter (Signed)
Attempted to call [facility], message left.

## 2018-06-01 ENCOUNTER — Telehealth: Payer: Self-pay | Admitting: *Deleted

## 2018-06-07 ENCOUNTER — Encounter: Payer: Medicare Other | Admitting: Nurse Practitioner

## 2018-07-06 ENCOUNTER — Other Ambulatory Visit
Admission: RE | Admit: 2018-07-06 | Discharge: 2018-07-06 | Disposition: A | Discharge status: Home / Self Care | Payer: No Typology Code available for payment source | Source: Ambulatory Visit | Attending: Nurse Practitioner | Admitting: Nurse Practitioner

## 2018-07-06 ENCOUNTER — Encounter: Payer: Self-pay | Admitting: Nurse Practitioner

## 2018-07-06 ENCOUNTER — Ambulatory Visit: Payer: Medicare Other | Attending: Nurse Practitioner | Admitting: Nurse Practitioner

## 2018-07-06 VITALS — BP 107/70 | HR 68 | Temp 97.7°F | Resp 16 | Ht 67.0 in | Wt 201.0 lb

## 2018-07-06 DIAGNOSIS — S83231A Complex tear of medial meniscus, current injury, right knee, initial encounter: Secondary | ICD-10-CM | POA: Diagnosis not present

## 2018-07-06 DIAGNOSIS — E538 Deficiency of other specified B group vitamins: Secondary | ICD-10-CM | POA: Diagnosis not present

## 2018-07-06 DIAGNOSIS — Z981 Arthrodesis status: Secondary | ICD-10-CM | POA: Diagnosis not present

## 2018-07-06 DIAGNOSIS — E559 Vitamin D deficiency, unspecified: Secondary | ICD-10-CM | POA: Diagnosis not present

## 2018-07-06 DIAGNOSIS — G894 Chronic pain syndrome: Secondary | ICD-10-CM | POA: Diagnosis not present

## 2018-07-06 DIAGNOSIS — M160 Bilateral primary osteoarthritis of hip: Secondary | ICD-10-CM | POA: Insufficient documentation

## 2018-07-06 DIAGNOSIS — M545 Low back pain: Secondary | ICD-10-CM | POA: Insufficient documentation

## 2018-07-06 DIAGNOSIS — G4701 Insomnia due to medical condition: Secondary | ICD-10-CM

## 2018-07-06 DIAGNOSIS — Z789 Other specified health status: Secondary | ICD-10-CM

## 2018-07-06 DIAGNOSIS — M25551 Pain in right hip: Secondary | ICD-10-CM

## 2018-07-06 DIAGNOSIS — Z79899 Other long term (current) drug therapy: Secondary | ICD-10-CM | POA: Diagnosis not present

## 2018-07-06 DIAGNOSIS — M792 Neuralgia and neuritis, unspecified: Secondary | ICD-10-CM

## 2018-07-06 DIAGNOSIS — M25552 Pain in left hip: Secondary | ICD-10-CM

## 2018-07-06 DIAGNOSIS — M4316 Spondylolisthesis, lumbar region: Secondary | ICD-10-CM | POA: Diagnosis not present

## 2018-07-06 DIAGNOSIS — M47816 Spondylosis without myelopathy or radiculopathy, lumbar region: Secondary | ICD-10-CM

## 2018-07-06 DIAGNOSIS — G8929 Other chronic pain: Secondary | ICD-10-CM

## 2018-07-06 DIAGNOSIS — E530 Riboflavin deficiency: Secondary | ICD-10-CM | POA: Diagnosis not present

## 2018-07-06 DIAGNOSIS — M7918 Myalgia, other site: Secondary | ICD-10-CM | POA: Diagnosis not present

## 2018-07-06 DIAGNOSIS — Z79891 Long term (current) use of opiate analgesic: Secondary | ICD-10-CM | POA: Diagnosis not present

## 2018-07-06 DIAGNOSIS — M170 Bilateral primary osteoarthritis of knee: Secondary | ICD-10-CM | POA: Diagnosis not present

## 2018-07-06 DIAGNOSIS — M899 Disorder of bone, unspecified: Secondary | ICD-10-CM

## 2018-07-06 DIAGNOSIS — M62830 Muscle spasm of back: Secondary | ICD-10-CM

## 2018-07-06 LAB — COMPREHENSIVE METABOLIC PANEL
ALK PHOS: 60 U/L (ref 38–126)
ALT: 15 U/L (ref 0–44)
AST: 18 U/L (ref 15–41)
Albumin: 4.1 g/dL (ref 3.5–5.0)
Anion gap: 8 (ref 5–15)
BUN: 15 mg/dL (ref 6–20)
CALCIUM: 9.1 mg/dL (ref 8.9–10.3)
CO2: 26 mmol/L (ref 22–32)
CREATININE: 0.47 mg/dL (ref 0.44–1.00)
Chloride: 105 mmol/L (ref 98–111)
Glucose, Bld: 118 mg/dL — ABNORMAL HIGH (ref 70–99)
Potassium: 4.2 mmol/L (ref 3.5–5.1)
Sodium: 139 mmol/L (ref 135–145)
Total Bilirubin: 0.9 mg/dL (ref 0.3–1.2)
Total Protein: 7.2 g/dL (ref 6.5–8.1)

## 2018-07-06 LAB — C-REACTIVE PROTEIN: CRP: 0.8 mg/dL (ref <1.0)

## 2018-07-06 LAB — MAGNESIUM: Magnesium: 2 mg/dL (ref 1.7–2.4)

## 2018-07-06 LAB — VITAMIN B12: Vitamin B-12: 207 pg/mL (ref 180–914)

## 2018-07-06 LAB — SEDIMENTATION RATE: Sed Rate: 20 mm/hr (ref 0–30)

## 2018-07-06 MED ORDER — HYDROCODONE-ACETAMINOPHEN 10-325 MG PO TABS: 1.0000 | ORAL_TABLET | Freq: Two times a day (BID) | ORAL | 0 refills | Status: DC | PRN

## 2018-07-06 MED ORDER — MELOXICAM 15 MG PO TABS: 15.0000 mg | ORAL_TABLET | Freq: Every morning | ORAL | 0 refills | Status: DC

## 2018-07-06 MED ORDER — CYCLOBENZAPRINE HCL 10 MG PO TABS: 10.0000 mg | ORAL_TABLET | Freq: Every day | ORAL | 0 refills | Status: DC

## 2018-07-06 MED ORDER — TRIAMCINOLONE ACETONIDE 0.1 % EX OINT: TOPICAL_OINTMENT | Freq: Every evening | CUTANEOUS | 2 refills | Status: DC

## 2018-07-06 MED ORDER — TRAZODONE HCL 100 MG PO TABS: 50.0000 mg | ORAL_TABLET | Freq: Every evening | ORAL | 0 refills | Status: DC | PRN

## 2018-07-06 MED ORDER — GABAPENTIN 300 MG PO CAPS: 300.0000 mg | ORAL_CAPSULE | Freq: Four times a day (QID) | ORAL | 2 refills | Status: DC

## 2018-07-06 MED ORDER — CVS MELATONIN 10 MG PO CAPS: 10.0000 mg | ORAL_CAPSULE | Freq: Every day | ORAL | 2 refills | Status: DC

## 2018-07-06 NOTE — Progress Notes (Signed)
Patient's Name: Stephanie [name]  MRN: [id number]  Referring Provider: [facility]  DOB: 09-17-1961  PCP: [facility]  DOS: 07/06/2018  Note by: Vevelyn Francois NP  Service setting: Ambulatory outpatient  Specialty: Interventional Pain Management  Location: [facility] (AMB) Pain Management Facility    Patient type: Established    Primary Reason(s) for Visit: Encounter for prescription drug management. (Level of risk: moderate)  CC: Back Pain (lumbar bilateral )  HPI  Stephanie [name] is a 57 y.o. year old, female patient, who comes today for a medication management evaluation. 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); B12 deficiency; Lumbar spondylosis; Chronic back pain; Epidural fibrosis; Spondylolisthesis of lumbar region; Sleep apnea; Vitamin D deficiency; Sleep disturbance; Insomnia secondary to chronic pain; Chronic lower extremity pain (Location of Secondary source of pain) (Bilateral) (L>R); Chronic lumbar radicular pain (Right) (S1); 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 Her primarily concern today is the Back Pain (lumbar bilateral )  Pain Assessment: Location: Lower, Left, Right Back Radiating: down both legs front and back into the toes  Onset: More than a month ago Duration: Chronic pain Quality: Sharp, Burning, Throbbing Severity: 3 /10 (subjective, self-reported pain score)  Note: Reported level is compatible with observation.                          Effect on ADL: medications allow her to do more activity that she otherwise would not be able to do.  Timing: Constant Modifying factors: medications help take the edge BP: 107/70  HR: 68  Stephanie [name] was last scheduled for an appointment on 03/16/2018 for medication management. During today's appointment we reviewed Stephanie [name]'s chronic pain status, as well as her outpatient medication regimen. She has throbbing pain that goes down into the tops of her feet.  She does have tingling that goes down the back of her leg.  She denies any weakness. She has itching in both hands.  She denies this in any other areas.  She denies any new concerns today.  She denies any side effects of her current regimen.  The patient  reports that she does not use drugs. Her body mass index is 31.48 kg/m.  Further details on both, my assessment(s), as well as the proposed treatment plan, please see below.  Controlled Substance Pharmacotherapy Assessment REMS (Risk Evaluation and Mitigation Strategy)  AAnalgesic:Hydrocodone/acetaminophen 10/325 mg twice daily MME/day:32m/day. PJanett Billow RN  07/06/2018  8:28 AM  Sign at close encounter Nursing Pain Medication Assessment:  Safety precautions to be maintained throughout the outpatient stay will include: orient to surroundings, keep bed in low position, maintain call bell within reach at all times, provide assistance with transfer out of bed and ambulation.  Medication Inspection Compliance: Pill count conducted under aseptic conditions, in front of the  patient. Neither the pills nor the bottle was removed from the patient's sight at any time. Once count was completed pills were immediately returned to the patient in their original bottle.  Medication: Hydrocodone/APAP Pill/Patch Count: 2 of 45 pills remain Pill/Patch Appearance: Markings consistent with prescribed medication Bottle Appearance: Standard pharmacy container. Clearly labeled. Filled Date: 08 / 01 / 2019 Last Medication intake:  Today   Pharmacokinetics: Liberation and absorption (onset of action): WNL Distribution (time to peak effect): WNL Metabolism and excretion (duration of action): WNL         Pharmacodynamics: Desired effects: Analgesia: Stephanie [name] reports >50% benefit. Functional ability: Patient reports that medication allows her to accomplish basic ADLs Clinically meaningful improvement in function (CMIF): Sustained CMIF goals met Perceived effectiveness: Described as relatively effective, allowing for increase in activities of daily living (ADL) Undesirable effects: Side-effects or Adverse reactions: None reported Monitoring: [place] PMP: Online review of the past 68-monthperiod conducted. Compliant with practice rules and regulations Last UDS on record: Summary  Date Value Ref Range Status  03/16/2018 FINAL  Final    Comment:    ==================================================================== TOXASSURE SELECT 13 (MW) ==================================================================== Test                             Result       Flag       Units Drug Present and Declared for Prescription Verification   Hydrocodone                    278          EXPECTED   ng/mg creat   Hydromorphone                  137          EXPECTED   ng/mg creat   Dihydrocodeine                 80           EXPECTED   ng/mg creat   Norhydrocodone                 875          EXPECTED   ng/mg creat    Sources of hydrocodone include scheduled prescription    medications.  Hydromorphone, dihydrocodeine and norhydrocodone are    expected metabolites of hydrocodone. Hydromorphone and    dihydrocodeine are also available as scheduled prescription    medications. ==================================================================== Test                      Result    Flag   Units      Ref Range   Creatinine              110              mg/dL      >=20 ==================================================================== Declared Medications:  The flagging and interpretation on this report are based on the  following declared medications.  Unexpected results may arise from  inaccuracies in the declared medications.  **Note: The testing scope of this panel includes these medications:  Hydrocodone (Hydrocodone-Acetaminophen)  **Note: The testing scope of this panel does not include following  reported medications:  Acetaminophen (Hydrocodone-Acetaminophen)  Cyclobenzaprine  Gabapentin  Melatonin  Meloxicam  Trazodone  Triamcinolone acetonide ==================================================================== For clinical  consultation, please call [phone]. ====================================================================    UDS interpretation: Compliant          Medication Assessment Form: Reviewed. Patient indicates being compliant with therapy Treatment compliance: Compliant Risk Assessment Profile: Aberrant behavior: See prior evaluations. None observed or detected today Comorbid factors increasing risk of overdose: See prior notes. No additional risks detected today Opioid risk tool (ORT) (Total Score): 0 Personal History of Substance Abuse (SUD-Substance use disorder):  Alcohol: Negative  Illegal Drugs: Negative  Rx Drugs: Negative  ORT Risk Level calculation: Low Risk Risk of substance use disorder (SUD): Low Opioid Risk Tool - 07/06/18 0831      Family History of Substance Abuse   Alcohol  Negative    Illegal Drugs  Negative    Rx  Drugs  Negative      Personal History of Substance Abuse   Alcohol  Negative    Illegal Drugs  Negative    Rx Drugs  Negative      Psychological Disease   Psychological Disease  Negative    Depression  Negative      Total Score   Opioid Risk Tool Scoring  0    Opioid Risk Interpretation  Low Risk      ORT Scoring interpretation table:  Score <3 = Low Risk for SUD  Score between 4-7 = Moderate Risk for SUD  Score >8 = High Risk for Opioid Abuse   Risk Mitigation Strategies:  Patient Counseling: Covered Patient-Prescriber Agreement (PPA): Present and active  Notification to other healthcare providers: Done  Pharmacologic Plan: No change in therapy, at this time.             Laboratory Chemistry  Inflammation Markers (CRP: Acute Phase) (ESR: Chronic Phase) Lab Results  Component Value Date   CRP 0.7 11/18/2015   ESRSEDRATE 20 07/06/2018                         Rheumatology Markers No results found for: RF, ANA, LABURIC, URICUR, LYMEIGGIGMAB, LYMEABIGMQN, HLAB27                      Renal Function Markers Lab Results  Component Value Date   BUN 15 07/06/2018   CREATININE 0.47 07/06/2018   GFRAA >60 07/06/2018   GFRNONAA >60 07/06/2018                             Hepatic Function Markers Lab Results  Component Value Date   AST 18 07/06/2018   ALT 15 07/06/2018   ALBUMIN 4.1 07/06/2018   ALKPHOS 60 07/06/2018                        Electrolytes Lab Results  Component Value Date   NA 139 07/06/2018   K 4.2 07/06/2018   CL 105 07/06/2018   CALCIUM 9.1 07/06/2018   MG 2.0 07/06/2018                        Neuropathy Markers Lab Results  Component Value Date   VITAMINB12 271 06/23/2017   HGBA1C 6.2 (H) 04/10/2014                        Bone Pathology Markers Lab Results  Component Value Date   25OHVITD1 34 05/07/2016   25OHVITD2 <1.0 05/07/2016  25OHVITD3 34 05/07/2016                         Coagulation Parameters Lab Results  Component  Value Date   PLT 298 04/10/2014                        Cardiovascular Markers Lab Results  Component Value Date   TROPONINI <0.30 04/10/2014   HGB 8.7 (L) 04/10/2014   HCT 28.6 (L) 04/10/2014                         CA Markers No results found for: CEA, CA125, LABCA2                      Note: Lab results reviewed.  Recent Diagnostic Imaging Results  DG Knee 1-2 Views Right CLINICAL DATA:  Knee pain.  EXAM: RIGHT KNEE - 1-2 VIEW  COMPARISON:  MRI of the right knee 01/17/2014  FINDINGS: Generalized degenerative spurring with medial compartment narrowing. No evidence of fracture or bone lesion. There is posterior translation of the tibial plateau relative to the distal femur. Negative for joint effusion.  IMPRESSION: 1. 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 2. Posterior translation of the tibial plateau relative to the distal femur, please correlate with drawer test.  Electronically Signed   By: Monte Fantasia M.D.   On: 12/21/2017 17:04 DG Knee 1-2 Views Left CLINICAL DATA:  Bilateral knee pain.  EXAM: LEFT KNEE - 1-2 VIEW  COMPARISON:  MRI of the left knee 01/07/2014  FINDINGS: Degenerative marginal spurring with moderate to advanced medial compartment narrowing. No fracture deformity or evidence of bone lesion. Negative for joint effusion.  IMPRESSION: Tricompartmental degenerative spurring with medial compartment narrowing.  Electronically Signed   By: Monte Fantasia M.D.   On: 12/21/2017 17:02  Complexity Note: Imaging results reviewed. Results shared with Stephanie [name], using Layman's terms.                         Meds   Current Outpatient Medications:  .  triamcinolone ointment (KENALOG) 0.1 %, Apply topically Nightly., Disp: 30 g, Rfl: 2 .  [facility] MELATONIN 10 MG CAPS, Take 10-20 mg by mouth at bedtime., Disp: 60 capsule, Rfl: 2 .  cyclobenzaprine (FLEXERIL) 10 MG tablet, Take 1 tablet (10 mg total) by mouth  daily., Disp: 90 tablet, Rfl: 0 .  gabapentin (NEURONTIN) 300 MG capsule, Take 1-3 capsules (300-900 mg total) by mouth 4 (four) times daily., Disp: 360 capsule, Rfl: 2 .  [START ON 09/04/2018] HYDROcodone-acetaminophen (NORCO) 10-325 MG tablet, Take 1 tablet by mouth every 12 (twelve) hours as needed for severe pain., Disp: 45 tablet, Rfl: 0 .  [START ON 08/05/2018] HYDROcodone-acetaminophen (NORCO) 10-325 MG tablet, Take 1 tablet by mouth every 12 (twelve) hours as needed for severe pain., Disp: 45 tablet, Rfl: 0 .  HYDROcodone-acetaminophen (NORCO) 10-325 MG tablet, Take 1 tablet by mouth every 12 (twelve) hours as needed for severe pain., Disp: 45 tablet, Rfl: 0 .  meloxicam (MOBIC) 15 MG tablet, Take 1 tablet (15 mg total) by mouth every morning., Disp: 90 tablet, Rfl: 0 .  traZODone (DESYREL) 100 MG tablet, Take 0.5 tablets (50 mg total) by mouth at bedtime as needed for sleep., Disp: 45 tablet, Rfl: 0  ROS  Constitutional: Denies any fever or chills Gastrointestinal: No reported hemesis, hematochezia, vomiting,  or acute GI distress Musculoskeletal: Denies any acute onset joint swelling, redness, loss of ROM, or weakness Neurological: No reported episodes of acute onset apraxia, aphasia, dysarthria, agnosia, amnesia, paralysis, loss of coordination, or loss of consciousness  Allergies  Stephanie [name] is allergic to phenergan [promethazine hcl].  PFSH  Drug: Stephanie [name]  reports that she does not use drugs. Alcohol:  reports that she does not drink alcohol. Tobacco:  reports that she has never smoked. She has never used smokeless tobacco. Medical:  has a past medical history of Allergy, Anemia, iron deficiency (04/09/2014), Arthritis, Atypical chest pain (04/09/2014), CD (contact dermatitis) (03/01/2014), Chest pain (04/09/2014), and Lumbar radicular pain (08/22/2015). Surgical: Stephanie [name]  has a past surgical history that includes Back surgery; Appendectomy; Cholecystectomy; Cesarean section; and  Bunionectomy. Family: family history includes Asthma in her father; Stroke in her mother.  Constitutional Exam  General appearance: Well nourished, well developed, and well hydrated. In no apparent acute distress Vitals:   07/06/18 0824  BP: 107/70  Pulse: 68  Resp: 16  Temp: 97.7 F (36.5 C)  TempSrc: Oral  SpO2: 98%  Weight: 201 lb (91.2 kg)  Height: 5' 7"  (1.702 m)  Psych/Mental status: Alert, oriented x 3 (person, place, & time)       Eyes: PERLA Respiratory: No evidence of acute respiratory distress  Cervical Spine Area Exam  Skin & Axial Inspection: No masses, redness, edema, swelling, or associated skin lesions Alignment: Symmetrical Functional ROM: Unrestricted ROM      Stability: No instability detected Muscle Tone/Strength: Functionally intact. No obvious neuro-muscular anomalies detected. Sensory (Neurological): Unimpaired Palpation: No palpable anomalies              Upper Extremity (UE) Exam    Side: Right upper extremity  Side: Left upper extremity  Skin & Extremity Inspection: Skin color, temperature, and hair growth are WNL. No peripheral edema or cyanosis. No masses, redness, swelling, asymmetry, or associated skin lesions. No contractures.  Skin & Extremity Inspection: Skin color, temperature, and hair growth are WNL. No peripheral edema or cyanosis. No masses, redness, swelling, asymmetry, or associated skin lesions. No contractures.  Functional ROM: Unrestricted ROM          Functional ROM: Unrestricted ROM          Muscle Tone/Strength: Functionally intact. No obvious neuro-muscular anomalies detected.  Muscle Tone/Strength: Functionally intact. No obvious neuro-muscular anomalies detected.  Sensory (Neurological): Unimpaired          Sensory (Neurological): Unimpaired          Palpation: No palpable anomalies              Palpation: No palpable anomalies              Provocative Test(s):  Phalen's test: deferred Tinel's test: deferred Apley's scratch test  (touch opposite shoulder):  Action 1 (Across chest): deferred Action 2 (Overhead): deferred Action 3 (LB reach): deferred   Provocative Test(s):  Phalen's test: deferred Tinel's test: deferred Apley's scratch test (touch opposite shoulder):  Action 1 (Across chest): deferred Action 2 (Overhead): deferred Action 3 (LB reach): deferred    Thoracic Spine Area Exam  Skin & Axial Inspection: No masses, redness, or swelling Alignment: Symmetrical Functional ROM: Unrestricted ROM Stability: No instability detected Muscle Tone/Strength: Functionally intact. No obvious neuro-muscular anomalies detected. Sensory (Neurological): Unimpaired Muscle strength & Tone: No palpable anomalies  Lumbar Spine Area Exam  Skin & Axial Inspection: No masses, redness, or swelling Alignment: Symmetrical Functional ROM: Unrestricted  ROM       Stability: No instability detected Muscle Tone/Strength: Functionally intact. No obvious neuro-muscular anomalies detected. Sensory (Neurological): Unimpaired Palpation: No palpable anomalies       Provocative Tests: Hyperextension/rotation test: deferred today       Lumbar quadrant test (Kemp's test): deferred today       Lateral bending test: deferred today       Patrick's Maneuver: deferred today                   FABER test: deferred today                   S-I anterior distraction/compression test: deferred today         S-I lateral compression test: deferred today         S-I Thigh-thrust test: deferred today         S-I Gaenslen's test: deferred today          Gait & Posture Assessment  Ambulation: Unassisted Gait: Relatively normal for age and body habitus Posture: WNL   Lower Extremity Exam    Side: Right lower extremity  Side: Left lower extremity  Stability: No instability observed          Stability: No instability observed          Skin & Extremity Inspection: Skin color, temperature, and hair growth are WNL. No peripheral edema or cyanosis. No  masses, redness, swelling, asymmetry, or associated skin lesions. No contractures.  Skin & Extremity Inspection: Skin color, temperature, and hair growth are WNL. No peripheral edema or cyanosis. No masses, redness, swelling, asymmetry, or associated skin lesions. No contractures.  Functional ROM: Unrestricted ROM                  Functional ROM: Unrestricted ROM                  Muscle Tone/Strength: Functionally intact. No obvious neuro-muscular anomalies detected.  Muscle Tone/Strength: Functionally intact. No obvious neuro-muscular anomalies detected.  Sensory (Neurological): Unimpaired  Sensory (Neurological): Unimpaired  Palpation: No palpable anomalies  Palpation: No palpable anomalies   Assessment  Primary Diagnosis & Pertinent Problem List: The primary encounter diagnosis was Lumbar spondylosis. Diagnoses of Spondylolisthesis of lumbar region, Chronic pain of both hips, Insomnia secondary to chronic pain, Musculoskeletal pain, Spasm of paraspinal muscle, Neurogenic pain, Chronic pain syndrome, Pharmacologic therapy, Disorder of skeletal system, and Problems influencing health status were also pertinent to this visit.  Status Diagnosis  Controlled Controlled Controlled 1. Lumbar spondylosis   2. Spondylolisthesis of lumbar region   3. Chronic pain of both hips   4. Insomnia secondary to chronic pain   5. Musculoskeletal pain   6. Spasm of paraspinal muscle   7. Neurogenic pain   8. Chronic pain syndrome   9. Pharmacologic therapy   10. Disorder of skeletal system   11. Problems influencing health status     Problems updated and reviewed during this visit: Problem  Pharmacologic Therapy  Disorder of Skeletal System  Problems Influencing Health Status   Plan of Care  Pharmacotherapy (Medications Ordered): Meds ordered this encounter  Medications  . triamcinolone ointment (KENALOG) 0.1 %    Sig: Apply topically Nightly.    Dispense:  30 g    Refill:  2    Order Specific  Question:   Supervising Provider    Answer:   Milinda Pointer [phone]  . [facility] MELATONIN 10 MG CAPS  Sig: Take 10-20 mg by mouth at bedtime.    Dispense:  60 capsule    Refill:  2    Do not place medication on "Automatic Refill". Fill one day early if pharmacy is closed on scheduled refill date.    Order Specific Question:   Supervising Provider    Answer:   Milinda Pointer [phone]  . cyclobenzaprine (FLEXERIL) 10 MG tablet    Sig: Take 1 tablet (10 mg total) by mouth daily.    Dispense:  90 tablet    Refill:  0    Do not place this medication, or any other prescription from our practice, on "Automatic Refill". Patient may have prescription filled one day early if pharmacy is closed on scheduled refill date.    Order Specific Question:   Supervising Provider    Answer:   Milinda Pointer [phone]  . gabapentin (NEURONTIN) 300 MG capsule    Sig: Take 1-3 capsules (300-900 mg total) by mouth 4 (four) times daily.    Dispense:  360 capsule    Refill:  2    Do not place this medication, or any other prescription from our practice, on "Automatic Refill". Patient may have prescription filled one day early if pharmacy is closed on scheduled refill date.    Order Specific Question:   Supervising Provider    Answer:   Milinda Pointer [phone]  . meloxicam (MOBIC) 15 MG tablet    Sig: Take 1 tablet (15 mg total) by mouth every morning.    Dispense:  90 tablet    Refill:  0    Do not place this medication, or any other prescription from our practice, on "Automatic Refill". Patient may have prescription filled one day early if pharmacy is closed on scheduled refill date.    Order Specific Question:   Supervising Provider    Answer:   Milinda Pointer [phone]  . traZODone (DESYREL) 100 MG tablet    Sig: Take 0.5 tablets (50 mg total) by mouth at bedtime as needed for sleep.    Dispense:  45 tablet    Refill:  0    Do not place medication on "Automatic Refill". Fill one day early  if pharmacy is closed on scheduled refill date.    Order Specific Question:   Supervising Provider    Answer:   Milinda Pointer [phone]  . HYDROcodone-acetaminophen (NORCO) 10-325 MG tablet    Sig: Take 1 tablet by mouth every 12 (twelve) hours as needed for severe pain.    Dispense:  45 tablet    Refill:  0    Do not place this medication on "Automatic Refill". Patient may have prescription filled one day early if pharmacy is closed on scheduled refill date. Do not fill until:09/04/2018 To last until: 10/04/2018    Order Specific Question:   Supervising Provider    Answer:   Milinda Pointer [phone]  . HYDROcodone-acetaminophen (NORCO) 10-325 MG tablet    Sig: Take 1 tablet by mouth every 12 (twelve) hours as needed for severe pain.    Dispense:  45 tablet    Refill:  0    Do not place this medication on "Automatic Refill". Patient may have prescription filled one day early if pharmacy is closed on scheduled refill date. Do not fill until:08/05/2018 To last until: 09/04/2018    Order Specific Question:   Supervising Provider    Answer:   Milinda Pointer [phone]  . HYDROcodone-acetaminophen (NORCO) 10-325 MG tablet    Sig: Take  1 tablet by mouth every 12 (twelve) hours as needed for severe pain.    Dispense:  45 tablet    Refill:  0    Do not place this medication on "Automatic Refill". Patient may have prescription filled one day early if pharmacy is closed on scheduled refill date. Do not fill until: 07/06/2018 To last until: 08/05/2018    Order Specific Question:   Supervising Provider    Answer:   Milinda Pointer [phone]   New Prescriptions   No medications on file   Medications administered today: Stephanie [name] had no medications administered during this visit. Lab-work, procedure(s), and/or referral(s): Orders Placed This Encounter  Procedures  . Comp. Metabolic Panel (12)  . Magnesium  . Vitamin B12  . Sedimentation rate  . 25-Hydroxyvitamin D Lcms D2+D3  .  C-reactive protein   Imaging and/or referral(s): None Interventional therapies: Planned, scheduled, and/or pending: Not at this time.   Considering:  Diagnostic bilateral lumbar facet block Possible bilateral lumbar facet RFA Diagnostic Caudal ESI Diagnostic Bilateral L4-5 & L5-S1Lumbar TFESI Diagnostic bilateral sacroiliac joint block Possible bilateral sacroiliac joint RFA Diagnostic bilateral hip joint injection Possible bilateral hip joint RFA Diagnostic bilateral knee joint injection Possible series of Hyalgan knee injections Diagnostic bilateral genicular nerve block Possible bilateral genicular nerve RFA.   Palliative PRN treatment(s):  PalliativeCaudal ESI    Provider-requested follow-up: Return in about 3 months (around 10/05/2018) for MedMgmt with Me Donella Stade Edison Pace).  Future Appointments  Date Time Provider Jauca  10/05/2018  8:30 AM Vevelyn Francois, NP [facility] None   Primary Care Physician: [facility] [facility] Note by: Vevelyn Francois NP Date: 07/06/2018; Time: 11:31 AM  Pain Score Disclaimer: We use the NRS-11 scale. This is a self-reported, subjective measurement of pain severity with only modest accuracy. It is used primarily to identify changes within a particular patient. It must be understood that outpatient pain scales are significantly less accurate that those used for research, where they can be applied under ideal controlled circumstances with minimal exposure to variables. In reality, the score is likely to be a combination of pain intensity and pain affect, where pain affect describes the degree of emotional arousal or changes in action readiness caused by the sensory experience of pain. Factors such as social and work situation, setting, emotional state, anxiety levels, expectation, and prior pain experience may influence pain perception and show large inter-individual differences  that may also be affected by time variables.  Patient instructions provided during this appointment: Patient Instructions  ____________________________________________________________________________________________  Medication Rules  Applies to: All patients receiving prescriptions (written or electronic).  Pharmacy of record: Pharmacy where electronic prescriptions will be sent. If written prescriptions are taken to a different pharmacy, please inform the nursing staff. The pharmacy listed in the electronic medical record should be the one where you would like electronic prescriptions to be sent.  Prescription refills: Only during scheduled appointments. Applies to both, written and electronic prescriptions.  NOTE: The following applies primarily to controlled substances (Opioid* Pain Medications).   Patient's responsibilities: 1. Pain Pills: Bring all pain pills to every appointment (except for procedure appointments). 2. Pill Bottles: Bring pills in original pharmacy bottle. Always bring newest bottle. Bring bottle, even if empty. 3. Medication refills: You are responsible for knowing and keeping track of what medications you need refilled. The day before your appointment, write a list of all prescriptions that need to be refilled. Bring that list  to your appointment and give it to the admitting nurse. Prescriptions will be written only during appointments. If you forget a medication, it will not be "Called in", "Faxed", or "electronically sent". You will need to get another appointment to get these prescribed. 4. Prescription Accuracy: You are responsible for carefully inspecting your prescriptions before leaving our office. Have the discharge nurse carefully go over each prescription with you, before taking them home. Make sure that your name is accurately spelled, that your address is correct. Check the name and dose of your medication to make sure it is accurate. Check the number of  pills, and the written instructions to make sure they are clear and accurate. Make sure that you are given enough medication to last until your next medication refill appointment. 5. Taking Medication: Take medication as prescribed. Never take more pills than instructed. Never take medication more frequently than prescribed. Taking less pills or less frequently is permitted and encouraged, when it comes to controlled substances (written prescriptions).  6. Inform other Doctors: Always inform, all of your healthcare providers, of all the medications you take. 7. Pain Medication from other Providers: You are not allowed to accept any additional pain medication from any other Doctor or Healthcare provider. There are two exceptions to this rule. (see below) In the event that you require additional pain medication, you are responsible for notifying us, as stated below. 8. Medication Agreement: You are responsible for carefully reading and following our Medication Agreement. This must be signed before receiving any prescriptions from our practice. Safely store a copy of your signed Agreement. Violations to the Agreement will result in no further prescriptions. (Additional copies of our Medication Agreement are available upon request.) 9. Laws, Rules, & Regulations: All patients are expected to follow all Federal and [employer], Rules, [employer]. Ignorance of the Laws does not constitute a valid excuse. The use of any illegal substances is prohibited. 10. Adopted CDC guidelines & recommendations: Target dosing levels will be at or below 60 MME/day. Use of benzodiazepines** is not recommended.  Exceptions: There are only two exceptions to the rule of not receiving pain medications from other Healthcare Providers. 1. Exception #1 (Emergencies): In the event of an emergency (i.e.: accident requiring emergency care), you are allowed to receive additional pain medication. However, you are responsible for:  As soon as you are able, call our office [phone], at any time of the day or night, and leave a message stating your name, the date and nature of the emergency, and the name and dose of the medication prescribed. In the event that your call is answered by a member of our staff, make sure to document and save the date, time, and the name of the person that took your information.  2. Exception #2 (Planned Surgery): In the event that you are scheduled by another doctor or dentist to have any type of surgery or procedure, you are allowed (for a period no longer than 30 days), to receive additional pain medication, for the acute post-op pain. However, in this case, you are responsible for picking up a copy of our "Post-op Pain Management for Surgeons" handout, and giving it to your surgeon or dentist. This document is available at our office, and does not require an appointment to obtain it. Simply go to our office during business hours (Monday-Thursday from 8:00 AM to 4:00 PM) (Friday 8:00 AM to 12:00 Noon) or if you have a scheduled appointment with [place], prior to your  surgery, and ask for it by name. In addition, you will need to provide [place] with your name, name of your surgeon, type of surgery, and date of procedure or surgery.  *Opioid medications include: morphine, codeine, oxycodone, oxymorphone, hydrocodone, hydromorphone, meperidine, tramadol, tapentadol, buprenorphine, fentanyl, methadone. **Benzodiazepine medications include: diazepam (Valium), alprazolam (Xanax), clonazepam (Klonopine), lorazepam (Ativan), clorazepate (Tranxene), chlordiazepoxide (Librium), estazolam (Prosom), oxazepam (Serax), temazepam (Restoril), triazolam (Halcion) (Last updated: 12/30/2017) ____________________________________________________________________________________________

## 2018-07-06 NOTE — Patient Instructions (Signed)
____________________________________________________________________________________________  Medication Rules  Applies to: All patients receiving prescriptions (written or electronic).  Pharmacy of record: Pharmacy where electronic prescriptions will be sent. If written prescriptions are taken to a different pharmacy, please inform the nursing staff. The pharmacy listed in the electronic medical record should be the one where you would like electronic prescriptions to be sent.  Prescription refills: Only during scheduled appointments. Applies to both, written and electronic prescriptions.  NOTE: The following applies primarily to controlled substances (Opioid* Pain Medications).   Patient's responsibilities: 1. Pain Pills: Bring all pain pills to every appointment (except for procedure appointments). 2. Pill Bottles: Bring pills in original pharmacy bottle. Always bring newest bottle. Bring bottle, even if empty. 3. Medication refills: You are responsible for knowing and keeping track of what medications you need refilled. The day before your appointment, write a list of all prescriptions that need to be refilled. Bring that list to your appointment and give it to the admitting nurse. Prescriptions will be written only during appointments. If you forget a medication, it will not be "Called in", "Faxed", or "electronically sent". You will need to get another appointment to get these prescribed. 4. Prescription Accuracy: You are responsible for carefully inspecting your prescriptions before leaving our office. Have the discharge nurse carefully go over each prescription with you, before taking them home. Make sure that your name is accurately spelled, that your address is correct. Check the name and dose of your medication to make sure it is accurate. Check the number of pills, and the written instructions to make sure they are clear and accurate. Make sure that you are given enough medication to last  until your next medication refill appointment. 5. Taking Medication: Take medication as prescribed. Never take more pills than instructed. Never take medication more frequently than prescribed. Taking less pills or less frequently is permitted and encouraged, when it comes to controlled substances (written prescriptions).  6. Inform other Doctors: Always inform, all of your healthcare providers, of all the medications you take. 7. Pain Medication from other Providers: You are not allowed to accept any additional pain medication from any other Doctor or Healthcare provider. There are two exceptions to this rule. (see below) In the event that you require additional pain medication, you are responsible for notifying us, as stated below. 8. Medication Agreement: You are responsible for carefully reading and following our Medication Agreement. This must be signed before receiving any prescriptions from our practice. Safely store a copy of your signed Agreement. Violations to the Agreement will result in no further prescriptions. (Additional copies of our Medication Agreement are available upon request.) 9. Laws, Rules, & Regulations: All patients are expected to follow all Federal and State Laws, Statutes, Rules, & Regulations. Ignorance of the Laws does not constitute a valid excuse. The use of any illegal substances is prohibited. 10. Adopted CDC guidelines & recommendations: Target dosing levels will be at or below 60 MME/day. Use of benzodiazepines** is not recommended.  Exceptions: There are only two exceptions to the rule of not receiving pain medications from other Healthcare Providers. 1. Exception #1 (Emergencies): In the event of an emergency (i.e.: accident requiring emergency care), you are allowed to receive additional pain medication. However, you are responsible for: As soon as you are able, call our office [phone], at any time of the day or night, and leave a message stating your name, the  date and nature of the emergency, and the name and dose of the medication   prescribed. In the event that your call is answered by a member of our staff, make sure to document and save the date, time, and the name of the person that took your information.  2. Exception #2 (Planned Surgery): In the event that you are scheduled by another doctor or dentist to have any type of surgery or procedure, you are allowed (for a period no longer than 30 days), to receive additional pain medication, for the acute post-op pain. However, in this case, you are responsible for picking up a copy of our "Post-op Pain Management for Surgeons" handout, and giving it to your surgeon or dentist. This document is available at our office, and does not require an appointment to obtain it. Simply go to our office during business hours (Monday-Thursday from 8:00 AM to 4:00 PM) (Friday 8:00 AM to 12:00 Noon) or if you have a scheduled appointment with us, prior to your surgery, and ask for it by name. In addition, you will need to provide us with your name, name of your surgeon, type of surgery, and date of procedure or surgery.  *Opioid medications include: morphine, codeine, oxycodone, oxymorphone, hydrocodone, hydromorphone, meperidine, tramadol, tapentadol, buprenorphine, fentanyl, methadone. **Benzodiazepine medications include: diazepam (Valium), alprazolam (Xanax), clonazepam (Klonopine), lorazepam (Ativan), clorazepate (Tranxene), chlordiazepoxide (Librium), estazolam (Prosom), oxazepam (Serax), temazepam (Restoril), triazolam (Halcion) (Last updated: 12/30/2017) ____________________________________________________________________________________________    

## 2018-07-06 NOTE — Progress Notes (Signed)
Nursing Pain Medication Assessment:  Safety precautions to be maintained throughout the outpatient stay will include: orient to surroundings, keep bed in low position, maintain call bell within reach at all times, provide assistance with transfer out of bed and ambulation.  Medication Inspection Compliance: Pill count conducted under aseptic conditions, in front of the patient. Neither the pills nor the bottle was removed from the patient's sight at any time. Once count was completed pills were immediately returned to the patient in their original bottle.  Medication: Hydrocodone/APAP Pill/Patch Count: 2 of 45 pills remain Pill/Patch Appearance: Markings consistent with prescribed medication Bottle Appearance: Standard pharmacy container. Clearly labeled. Filled Date: 08 / 01 / 2019 Last Medication intake:  Today

## 2018-07-07 LAB — VITAMIN D 25 HYDROXY (VIT D DEFICIENCY, FRACTURES): VIT D 25 HYDROXY: 38.6 ng/mL (ref 30.0–100.0)

## 2018-08-15 ENCOUNTER — Ambulatory Visit: Payer: Self-pay | Admitting: Nurse Practitioner

## 2018-10-05 ENCOUNTER — Other Ambulatory Visit: Payer: Self-pay

## 2018-10-05 ENCOUNTER — Encounter: Payer: Self-pay | Admitting: Nurse Practitioner

## 2018-10-05 ENCOUNTER — Ambulatory Visit: Payer: No Typology Code available for payment source | Attending: Nurse Practitioner | Admitting: Nurse Practitioner

## 2018-10-05 VITALS — BP 129/72 | HR 70 | Temp 97.7°F | Resp 16 | Ht 67.0 in | Wt 207.0 lb

## 2018-10-05 DIAGNOSIS — M7918 Myalgia, other site: Secondary | ICD-10-CM

## 2018-10-05 DIAGNOSIS — Z9889 Other specified postprocedural states: Secondary | ICD-10-CM | POA: Diagnosis not present

## 2018-10-05 DIAGNOSIS — G4701 Insomnia due to medical condition: Secondary | ICD-10-CM

## 2018-10-05 DIAGNOSIS — Z5181 Encounter for therapeutic drug level monitoring: Secondary | ICD-10-CM | POA: Diagnosis present

## 2018-10-05 DIAGNOSIS — Z79891 Long term (current) use of opiate analgesic: Secondary | ICD-10-CM | POA: Insufficient documentation

## 2018-10-05 DIAGNOSIS — M545 Low back pain: Secondary | ICD-10-CM | POA: Diagnosis not present

## 2018-10-05 DIAGNOSIS — Z9049 Acquired absence of other specified parts of digestive tract: Secondary | ICD-10-CM | POA: Insufficient documentation

## 2018-10-05 DIAGNOSIS — F5104 Psychophysiologic insomnia: Secondary | ICD-10-CM | POA: Insufficient documentation

## 2018-10-05 DIAGNOSIS — G8929 Other chronic pain: Secondary | ICD-10-CM | POA: Diagnosis not present

## 2018-10-05 DIAGNOSIS — M47816 Spondylosis without myelopathy or radiculopathy, lumbar region: Secondary | ICD-10-CM | POA: Insufficient documentation

## 2018-10-05 DIAGNOSIS — G894 Chronic pain syndrome: Secondary | ICD-10-CM

## 2018-10-05 DIAGNOSIS — M4316 Spondylolisthesis, lumbar region: Secondary | ICD-10-CM | POA: Diagnosis not present

## 2018-10-05 DIAGNOSIS — M170 Bilateral primary osteoarthritis of knee: Secondary | ICD-10-CM | POA: Diagnosis not present

## 2018-10-05 DIAGNOSIS — M62830 Muscle spasm of back: Secondary | ICD-10-CM

## 2018-10-05 DIAGNOSIS — M792 Neuralgia and neuritis, unspecified: Secondary | ICD-10-CM | POA: Diagnosis not present

## 2018-10-05 MED ORDER — HYDROCODONE-ACETAMINOPHEN 10-325 MG PO TABS: 1.0000 | ORAL_TABLET | Freq: Two times a day (BID) | ORAL | 0 refills | Status: DC | PRN

## 2018-10-05 MED ORDER — CYCLOBENZAPRINE HCL 10 MG PO TABS: 10.0000 mg | ORAL_TABLET | Freq: Every day | ORAL | 0 refills | Status: DC

## 2018-10-05 MED ORDER — CVS MELATONIN 10 MG PO CAPS: 10.0000 mg | ORAL_CAPSULE | Freq: Every day | ORAL | 2 refills | Status: DC

## 2018-10-05 MED ORDER — TRAZODONE HCL 100 MG PO TABS: 50.0000 mg | ORAL_TABLET | Freq: Every evening | ORAL | 0 refills | Status: DC | PRN

## 2018-10-05 MED ORDER — GABAPENTIN 300 MG PO CAPS: 300.0000 mg | ORAL_CAPSULE | Freq: Four times a day (QID) | ORAL | 2 refills | Status: DC

## 2018-10-05 MED ORDER — MELOXICAM 15 MG PO TABS: 15.0000 mg | ORAL_TABLET | Freq: Every morning | ORAL | 0 refills | Status: DC

## 2018-10-05 MED ORDER — TRIAMCINOLONE ACETONIDE 0.1 % EX OINT: TOPICAL_OINTMENT | Freq: Every evening | CUTANEOUS | 2 refills | Status: DC

## 2018-10-05 NOTE — Progress Notes (Signed)
Patient's Name: Stephanie [name]  MRN: [id number]  Referring Provider: [facility]  DOB: September 09, 1961  PCP: [facility]  DOS: 10/05/2018  Note by: Vevelyn Francois NP  Service setting: Ambulatory outpatient  Specialty: Interventional Pain Management  Location: [facility] (AMB) Pain Management Facility    Patient type: Established    Primary Reason(s) for Visit: Encounter for prescription drug management. (Level of risk: moderate)  CC: Back Pain (lower left)  HPI  Stephanie [name] is a 57 y.o. year old, female patient, who comes today for a medication management evaluation. 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); B12 deficiency; Lumbar spondylosis; Chronic back pain; Epidural fibrosis; Spondylolisthesis of lumbar region; Sleep apnea; Vitamin D deficiency; Sleep disturbance; Insomnia secondary to chronic pain; Chronic lower extremity pain (Location of Secondary source of pain) (Bilateral) (L>R); Chronic lumbar radicular pain (Right) (S1); 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 Her  primarily concern today is the Back Pain (lower left)  Pain Assessment: Location: Left, Lower Back Radiating: denies Onset: More than a month ago Duration: Chronic pain Quality: Burning, Tightness Severity: 2 /10 (subjective, self-reported pain score)  Note: Reported level is compatible with observation.                          Effect on ADL: limits daily activities Timing: Intermittent Modifying factors: meds BP: 129/72  HR: 70  Stephanie [name] was last scheduled for an appointment on 07/06/2018 for medication management. During today's appointment we reviewed Stephanie [name]'s chronic pain status, as well as her outpatient medication regimen.  She admits that she is having burning pain in the left side of her back.  She feels like this is new however it may be related to long distance traveling.  She denies any leg pain.    The patient  reports that she does not use drugs. Her body mass index is 32.42 kg/m.  Further details on both, my assessment(s), as well as the proposed treatment plan, please see below.  Controlled Substance Pharmacotherapy Assessment REMS (Risk Evaluation and Mitigation Strategy)  AAnalgesic:Hydrocodone/acetaminophen 10/325 mg twice daily MME/day:'20mg'$ /day. Stephanie Patience, RN  10/05/2018  8:19 AM  Sign at close encounter Nursing Pain Medication Assessment:  Safety precautions to be maintained throughout the outpatient stay will include: orient to surroundings, keep bed in low position, maintain call bell within reach at all times, provide assistance with transfer out of bed and ambulation.  Medication Inspection Compliance: Pill count conducted under aseptic conditions, in front of the patient. Neither the pills nor the bottle was removed from the patient's sight at any time. Once count was completed pills were immediately returned to the patient in their original bottle.  Medication: Hydrocodone/APAP Pill/Patch Count: 6 of 45 pills  remain Pill/Patch Appearance:  Markings consistent with prescribed medication Bottle Appearance: Standard pharmacy container. Clearly labeled. Filled Date: 6 / 08 / 2019 Last Medication intake:  Today   Pharmacokinetics: Liberation and absorption (onset of action): WNL Distribution (time to peak effect): WNL Metabolism and excretion (duration of action): WNL         Pharmacodynamics: Desired effects: Analgesia: Stephanie [name] reports >50% benefit. Functional ability: Patient reports that medication allows her to accomplish basic ADLs Clinically meaningful improvement in function (CMIF): Sustained CMIF goals met Perceived effectiveness: Described as relatively effective, allowing for increase in activities of daily living (ADL) Undesirable effects: Side-effects or Adverse reactions: None reported Monitoring: [place] PMP: Online review of the past 39-monthperiod conducted. Compliant with practice rules and regulations Last UDS on record: Summary  Date Value Ref Range Status  03/16/2018 FINAL  Final    Comment:    ==================================================================== TOXASSURE SELECT 13 (MW) ==================================================================== Test                             Result       Flag       Units Drug Present and Declared for Prescription Verification   Hydrocodone                    278          EXPECTED   ng/mg creat   Hydromorphone                  137          EXPECTED   ng/mg creat   Dihydrocodeine                 80           EXPECTED   ng/mg creat   Norhydrocodone                 875          EXPECTED   ng/mg creat    Sources of hydrocodone include scheduled prescription    medications. Hydromorphone, dihydrocodeine and norhydrocodone are    expected metabolites of hydrocodone. Hydromorphone and    dihydrocodeine are also available as scheduled prescription    medications. ==================================================================== Test                       Result    Flag   Units      Ref Range   Creatinine              110              mg/dL      >=20 ==================================================================== Declared Medications:  The flagging and interpretation on this report are based on the  following declared medications.  Unexpected results may arise from  inaccuracies in the declared medications.  **Note: The testing scope of this panel includes these medications:  Hydrocodone (Hydrocodone-Acetaminophen)  **Note: The testing scope of this panel does not include following  reported medications:  Acetaminophen (Hydrocodone-Acetaminophen)  Cyclobenzaprine  Gabapentin  Melatonin  Meloxicam  Trazodone  Triamcinolone acetonide ==================================================================== For clinical consultation, please call [phone] ====================================================================    UDS interpretation: Compliant          Medication Assessment Form: Reviewed. Patient indicates being compliant with therapy Treatment compliance: Compliant Risk Assessment Profile: Aberrant behavior: See prior evaluations. None observed or detected today Comorbid factors increasing risk of overdose:  See prior notes. No additional risks detected today Opioid risk tool (ORT) (Total Score): 0 Personal History of Substance Abuse (SUD-Substance use disorder):  Alcohol: Negative  Illegal Drugs: Negative  Rx Drugs: Negative  ORT Risk Level calculation: Low Risk Risk of substance use disorder (SUD): Low Opioid Risk Tool - 10/05/18 0820      Family History of Substance Abuse   Alcohol  Negative    Illegal Drugs  Negative    Rx Drugs  Negative      Personal History of Substance Abuse   Alcohol  Negative    Illegal Drugs  Negative    Rx Drugs  Negative      Age   Age between 72-45 years   No      History of Preadolescent Sexual Abuse   History of Preadolescent Sexual Abuse  Negative or Female       Psychological Disease   Psychological Disease  Negative    Depression  Negative      Total Score   Opioid Risk Tool Scoring  0    Opioid Risk Interpretation  Low Risk      ORT Scoring interpretation table:  Score <3 = Low Risk for SUD  Score between 4-7 = Moderate Risk for SUD  Score >8 = High Risk for Opioid Abuse   Risk Mitigation Strategies:  Patient Counseling: Covered Patient-Prescriber Agreement (PPA): Present and active  Notification to other healthcare providers: Done  Pharmacologic Plan: No change in therapy, at this time.             Laboratory Chemistry  Inflammation Markers (CRP: Acute Phase) (ESR: Chronic Phase) Lab Results  Component Value Date   CRP 0.8 07/06/2018   ESRSEDRATE 20 07/06/2018                         Rheumatology Markers No results found for: RF, ANA, LABURIC, URICUR, LYMEIGGIGMAB, LYMEABIGMQN, HLAB27                      Renal Function Markers Lab Results  Component Value Date   BUN 15 07/06/2018   CREATININE 0.47 07/06/2018   GFRAA >60 07/06/2018   GFRNONAA >60 07/06/2018                             Hepatic Function Markers Lab Results  Component Value Date   AST 18 07/06/2018   ALT 15 07/06/2018   ALBUMIN 4.1 07/06/2018   ALKPHOS 60 07/06/2018                        Electrolytes Lab Results  Component Value Date   NA 139 07/06/2018   K 4.2 07/06/2018   CL 105 07/06/2018   CALCIUM 9.1 07/06/2018   MG 2.0 07/06/2018                        Neuropathy Markers Lab Results  Component Value Date   VITAMINB12 207 07/06/2018   HGBA1C 6.2 (H) 04/10/2014                        CNS Tests No results found for: COLORCSF, APPEARCSF, RBCCOUNTCSF, WBCCSF, POLYSCSF, LYMPHSCSF, EOSCSF, PROTEINCSF, GLUCCSF, JCVIRUS, CSFOLI, IGGCSF  Bone Pathology Markers Lab Results  Component Value Date   VD25OH 38.6 07/06/2018   25OHVITD1 34 05/07/2016   25OHVITD2 <1.0 05/07/2016   25OHVITD3 34 05/07/2016                          Coagulation Parameters Lab Results  Component Value Date   PLT 298 04/10/2014                        Cardiovascular Markers Lab Results  Component Value Date   TROPONINI <0.30 04/10/2014   HGB 8.7 (L) 04/10/2014   HCT 28.6 (L) 04/10/2014                         CA Markers No results found for: CEA, CA125, LABCA2                      Note: Lab results reviewed.  Recent Diagnostic Imaging Results  DG Knee 1-2 Views Right CLINICAL DATA:  Knee pain.  EXAM: RIGHT KNEE - 1-2 VIEW  COMPARISON:  MRI of the right knee 01/17/2014  FINDINGS: Generalized degenerative spurring with medial compartment narrowing. No evidence of fracture or bone lesion. There is posterior translation of the tibial plateau relative to the distal femur. Negative for joint effusion.  IMPRESSION: 1. 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 2. Posterior translation of the tibial plateau relative to the distal femur, please correlate with drawer test.  Electronically Signed   By: Monte Fantasia M.D.   On: 12/21/2017 17:04 DG Knee 1-2 Views Left CLINICAL DATA:  Bilateral knee pain.  EXAM: LEFT KNEE - 1-2 VIEW  COMPARISON:  MRI of the left knee 01/07/2014  FINDINGS: Degenerative marginal spurring with moderate to advanced medial compartment narrowing. No fracture deformity or evidence of bone lesion. Negative for joint effusion.  IMPRESSION: Tricompartmental degenerative spurring with medial compartment narrowing.  Electronically Signed   By: Monte Fantasia M.D.   On: 12/21/2017 17:02  Complexity Note: Imaging results reviewed. Results shared with Stephanie [name], using Layman's terms.                         Meds   Current Outpatient Medications:  .  [START ON 10/09/2018] [facility] MELATONIN 10 MG CAPS, Take 10-20 mg by mouth at bedtime., Disp: 60 capsule, Rfl: 2 .  [START ON 10/09/2018] cyclobenzaprine (FLEXERIL) 10 MG tablet, Take 1 tablet (10 mg total) by  mouth daily., Disp: 90 tablet, Rfl: 0 .  [START ON 10/09/2018] gabapentin (NEURONTIN) 300 MG capsule, Take 1-3 capsules (300-900 mg total) by mouth 4 (four) times daily., Disp: 360 capsule, Rfl: 2 .  HYDROcodone-acetaminophen (NORCO) 10-325 MG tablet, Take 1 tablet by mouth 2 (two) times daily as needed., Disp: , Rfl:  .  [START ON 10/09/2018] meloxicam (MOBIC) 15 MG tablet, Take 1 tablet (15 mg total) by mouth every morning., Disp: 90 tablet, Rfl: 0 .  [START ON 10/09/2018] traZODone (DESYREL) 100 MG tablet, Take 0.5 tablets (50 mg total) by mouth at bedtime as needed for sleep., Disp: 45 tablet, Rfl: 0 .  [START ON 10/09/2018] triamcinolone ointment (KENALOG) 0.1 %, Apply topically Nightly., Disp: 30 g, Rfl: 2 .  [START ON 12/08/2018] HYDROcodone-acetaminophen (NORCO) 10-325 MG tablet, Take 1 tablet by mouth every 12 (twelve) hours as needed for severe pain., Disp: 45 tablet, Rfl: 0 .  [START ON  11/08/2018] HYDROcodone-acetaminophen (NORCO) 10-325 MG tablet, Take 1 tablet by mouth every 12 (twelve) hours as needed for severe pain., Disp: 45 tablet, Rfl: 0 .  [START ON 10/09/2018] HYDROcodone-acetaminophen (NORCO) 10-325 MG tablet, Take 1 tablet by mouth every 12 (twelve) hours as needed for severe pain., Disp: 45 tablet, Rfl: 0  ROS  Constitutional: Denies any fever or chills Gastrointestinal: No reported hemesis, hematochezia, vomiting, or acute GI distress Musculoskeletal: Denies any acute onset joint swelling, redness, loss of ROM, or weakness Neurological: No reported episodes of acute onset apraxia, aphasia, dysarthria, agnosia, amnesia, paralysis, loss of coordination, or loss of consciousness  Allergies  Stephanie [name] is allergic to phenergan [promethazine hcl].  PFSH  Drug: Stephanie [name]  reports that she does not use drugs. Alcohol:  reports that she does not drink alcohol. Tobacco:  reports that she has never smoked. She has never used smokeless tobacco. Medical:  has a past medical history of  Allergy, Anemia, iron deficiency (04/09/2014), Arthritis, Atypical chest pain (04/09/2014), CD (contact dermatitis) (03/01/2014), Chest pain (04/09/2014), and Lumbar radicular pain (08/22/2015). Surgical: Stephanie [name]  has a past surgical history that includes Back surgery; Appendectomy; Cholecystectomy; Cesarean section; and Bunionectomy. Family: family history includes Asthma in her father; Stroke in her mother.  Constitutional Exam  General appearance: Well nourished, well developed, and well hydrated. In no apparent acute distress Vitals:   10/05/18 0810  BP: 129/72  Pulse: 70  Resp: 16  Temp: 97.7 F (36.5 C)  TempSrc: Oral  SpO2: 99%  Weight: 207 lb (93.9 kg)  Height: '5\' 7"'$  (1.702 m)  Psych/Mental status: Alert, oriented x 3 (person, place, & time)       Eyes: PERLA Respiratory: No evidence of acute respiratory distress  Lumbar Spine Area Exam  Skin & Axial Inspection: No masses, redness, or swelling Alignment: Symmetrical Functional ROM: Unrestricted ROM       Stability: No instability detected Muscle Tone/Strength: Functionally intact. No obvious neuro-muscular anomalies detected. Sensory (Neurological): Unimpaired Palpation: No palpable anomalies       Provocative Tests: Hyperextension/rotation test: deferred today       Lumbar quadrant test (Kemp's test): deferred today       Lateral bending test: deferred today       Patrick's Maneuver: deferred today                   FABER test: deferred today                   S-I anterior distraction/compression test: deferred today         S-I lateral compression test: deferred today         S-I Thigh-thrust test: deferred today         S-I Gaenslen's test: deferred today          Gait & Posture Assessment  Ambulation: Unassisted Gait: Relatively normal for age and body habitus Posture: WNL   Lower Extremity Exam    Side: Right lower extremity  Side: Left lower extremity  Stability: No instability observed          Stability: No  instability observed          Skin & Extremity Inspection: Skin color, temperature, and hair growth are WNL. No peripheral edema or cyanosis. No masses, redness, swelling, asymmetry, or associated skin lesions. No contractures.  Skin & Extremity Inspection: Skin color, temperature, and hair growth are WNL. No peripheral edema or cyanosis. No masses, redness, swelling, asymmetry, or  associated skin lesions. No contractures.  Functional ROM: Unrestricted ROM                  Functional ROM: Unrestricted ROM                  Muscle Tone/Strength: Functionally intact. No obvious neuro-muscular anomalies detected.  Muscle Tone/Strength: Functionally intact. No obvious neuro-muscular anomalies detected.  Sensory (Neurological): Unimpaired        Sensory (Neurological): Unimpaired            Palpation: No palpable anomalies  Palpation: No palpable anomalies   Assessment  Primary Diagnosis & Pertinent Problem List: The primary encounter diagnosis was Spondylolisthesis of lumbar region. Diagnoses of Spasm of paraspinal muscle, Neurogenic pain, Insomnia secondary to chronic pain, Musculoskeletal pain, and Chronic pain syndrome were also pertinent to this visit.  Status Diagnosis  Controlled Controlled Controlled 1. Spondylolisthesis of lumbar region   2. Spasm of paraspinal muscle   3. Neurogenic pain   4. Insomnia secondary to chronic pain   5. Musculoskeletal pain   6. Chronic pain syndrome     Problems updated and reviewed during this visit: No problems updated. Plan of Care  Pharmacotherapy (Medications Ordered): Meds ordered this encounter  Medications  . [facility] MELATONIN 10 MG CAPS    Sig: Take 10-20 mg by mouth at bedtime.    Dispense:  60 capsule    Refill:  2    Do not place medication on "Automatic Refill". Fill one day early if pharmacy is closed on scheduled refill date.    Order Specific Question:   Supervising Provider    Answer:   Milinda Pointer [phone]  . cyclobenzaprine  (FLEXERIL) 10 MG tablet    Sig: Take 1 tablet (10 mg total) by mouth daily.    Dispense:  90 tablet    Refill:  0    Do not place this medication, or any other prescription from our practice, on "Automatic Refill". Patient may have prescription filled one day early if pharmacy is closed on scheduled refill date.    Order Specific Question:   Supervising Provider    Answer:   Milinda Pointer [phone]  . gabapentin (NEURONTIN) 300 MG capsule    Sig: Take 1-3 capsules (300-900 mg total) by mouth 4 (four) times daily.    Dispense:  360 capsule    Refill:  2    Do not place this medication, or any other prescription from our practice, on "Automatic Refill". Patient may have prescription filled one day early if pharmacy is closed on scheduled refill date.    Order Specific Question:   Supervising Provider    Answer:   Milinda Pointer [phone]  . meloxicam (MOBIC) 15 MG tablet    Sig: Take 1 tablet (15 mg total) by mouth every morning.    Dispense:  90 tablet    Refill:  0    Do not place this medication, or any other prescription from our practice, on "Automatic Refill". Patient may have prescription filled one day early if pharmacy is closed on scheduled refill date.    Order Specific Question:   Supervising Provider    Answer:   Milinda Pointer [phone]  . traZODone (DESYREL) 100 MG tablet    Sig: Take 0.5 tablets (50 mg total) by mouth at bedtime as needed for sleep.    Dispense:  45 tablet    Refill:  0    Do not place medication on "Automatic Refill". Fill one  day early if pharmacy is closed on scheduled refill date.    Order Specific Question:   Supervising Provider    Answer:   Milinda Pointer [phone]  . triamcinolone ointment (KENALOG) 0.1 %    Sig: Apply topically Nightly.    Dispense:  30 g    Refill:  2    Order Specific Question:   Supervising Provider    Answer:   Milinda Pointer [phone]  . HYDROcodone-acetaminophen (NORCO) 10-325 MG tablet    Sig: Take 1  tablet by mouth every 12 (twelve) hours as needed for severe pain.    Dispense:  45 tablet    Refill:  0    Do not place this medication, or any other prescription from our practice, on "Automatic Refill". Patient may have prescription filled one day early if pharmacy is closed on scheduled refill date.    Order Specific Question:   Supervising Provider    Answer:   Milinda Pointer [phone]  . HYDROcodone-acetaminophen (NORCO) 10-325 MG tablet    Sig: Take 1 tablet by mouth every 12 (twelve) hours as needed for severe pain.    Dispense:  45 tablet    Refill:  0    Do not place this medication, or any other prescription from our practice, on "Automatic Refill". Patient may have prescription filled one day early if pharmacy is closed on scheduled refill date.    Order Specific Question:   Supervising Provider    Answer:   Milinda Pointer [phone]  . HYDROcodone-acetaminophen (NORCO) 10-325 MG tablet    Sig: Take 1 tablet by mouth every 12 (twelve) hours as needed for severe pain.    Dispense:  45 tablet    Refill:  0    Do not place this medication, or any other prescription from our practice, on "Automatic Refill". Patient may have prescription filled one day early if pharmacy is closed on scheduled refill date.    Order Specific Question:   Supervising Provider    Answer:   Milinda Pointer [[id number]]   New Prescriptions   No medications on file   Medications administered today: Doral Digangi. [name] had no medications administered during this visit. Lab-work, procedure(s), and/or referral(s): No orders of the defined types were placed in this encounter.  Imaging and/or referral(s): None  Interventional therapies: Planned, scheduled, and/or pending: Not at this time.   Considering:  Diagnostic bilateral lumbar facet block Possible bilateral lumbar facet RFA Diagnostic Caudal ESI Diagnostic Bilateral L4-5 & L5-S1Lumbar TFESI Diagnostic bilateral sacroiliac joint  block Possible bilateral sacroiliac joint RFA Diagnostic bilateral hip joint injection Possible bilateral hip joint RFA Diagnostic bilateral knee joint injection Possible series of Hyalgan knee injections Diagnostic bilateral genicular nerve block Possible bilateral genicular nerve RFA.   Palliative PRN treatment(s):  PalliativeCaudal ESI    Provider-requested follow-up: Return in about 3 months (around 01/04/2019) for MedMgmt.  Future Appointments  Date Time Provider [place]  12/27/2018  8:00 AM Vevelyn Francois, NP [facility] None   Primary Care Physician: [facility] Location: [facility] Outpatient Pain Management Facility Note by: Vevelyn Francois NP Date: 10/05/2018; Time: 3:53 PM  Pain Score Disclaimer: We use the NRS-11 scale. This is a self-reported, subjective measurement of pain severity with only modest accuracy. It is used primarily to identify changes within a particular patient. It must be understood that outpatient pain scales are significantly less accurate that those used for research, where they can be applied under ideal controlled circumstances with minimal exposure  to variables. In reality, the score is likely to be a combination of pain intensity and pain affect, where pain affect describes the degree of emotional arousal or changes in action readiness caused by the sensory experience of pain. Factors such as social and work situation, setting, emotional state, anxiety levels, expectation, and prior pain experience may influence pain perception and show large inter-individual differences that may also be affected by time variables.  Patient instructions provided during this appointment: Patient Instructions   Hydrocodone with acetaminophen to last until 01/07/2019, gabapentin, flexeril, meloxicam, melatonin, trazadone and kenalog ointment have been escribed to your  pharmacy.____________________________________________________________________________________________  Medication Rules  Purpose: To inform patients, and their family members, of our rules and regulations.  Applies to: All patients receiving prescriptions (written or electronic).  Pharmacy of record: Pharmacy where electronic prescriptions will be sent. If written prescriptions are taken to a different pharmacy, please inform the nursing staff. The pharmacy listed in the electronic medical record should be the one where you would like electronic prescriptions to be sent.  Electronic prescriptions: In compliance with the [place] (STOP) Act of 2017 (Session Lanny Cramp 502 194 1125), effective November 02, 2018, all controlled substances must be electronically prescribed. Calling prescriptions to the pharmacy will cease to exist.  Prescription refills: Only during scheduled appointments. Applies to all prescriptions.  NOTE: The following applies primarily to controlled substances (Opioid* Pain Medications).   Patient's responsibilities: 1. Pain Pills: Bring all pain pills to every appointment (except for procedure appointments). 2. Pill Bottles: Bring pills in original pharmacy bottle. Always bring the newest bottle. Bring bottle, even if empty. 3. Medication refills: You are responsible for knowing and keeping track of what medications you take and those you need refilled. The day before your appointment: write a list of all prescriptions that need to be refilled. The day of the appointment: give the list to the admitting nurse. Prescriptions will be written only during appointments. If you forget a medication: it will not be "Called in", "Faxed", or "electronically sent". You will need to get another appointment to get these prescribed. No early refills. Do not call asking to have your prescription filled early. 4. Prescription Accuracy: You are responsible  for carefully inspecting your prescriptions before leaving our office. Have the discharge nurse carefully go over each prescription with you, before taking them home. Make sure that your name is accurately spelled, that your address is correct. Check the name and dose of your medication to make sure it is accurate. Check the number of pills, and the written instructions to make sure they are clear and accurate. Make sure that you are given enough medication to last until your next medication refill appointment. 5. Taking Medication: Take medication as prescribed. When it comes to controlled substances, taking less pills or less frequently than prescribed is permitted and encouraged. Never take more pills than instructed. Never take medication more frequently than prescribed.  6. Inform other Doctors: Always inform, all of your healthcare providers, of all the medications you take. 7. Pain Medication from other Providers: You are not allowed to accept any additional pain medication from any other Doctor or Healthcare provider. There are two exceptions to this rule. (see below) In the event that you require additional pain medication, you are responsible for notifying us, as stated below. 8. Medication Agreement: You are responsible for carefully reading and following our Medication Agreement. This must be signed before receiving any prescriptions from our practice. Safely store a copy  of your signed Agreement. Violations to the Agreement will result in no further prescriptions. (Additional copies of our Medication Agreement are available upon request.) 9. Laws, Rules, & Regulations: All patients are expected to follow all Federal and [employer], [employer], Rules, [employer]. Ignorance of the Laws does not constitute a valid excuse. The use of any illegal substances is prohibited. 10. Adopted CDC guidelines & recommendations: Target dosing levels will be at or below 60 MME/day. Use of benzodiazepines** is  not recommended.  Exceptions: There are only two exceptions to the rule of not receiving pain medications from other Healthcare Providers. 1. Exception #1 (Emergencies): In the event of an emergency (i.e.: accident requiring emergency care), you are allowed to receive additional pain medication. However, you are responsible for: As soon as you are able, call our office [phone], at any time of the day or night, and leave a message stating your name, the date and nature of the emergency, and the name and dose of the medication prescribed. In the event that your call is answered by a member of our staff, make sure to document and save the date, time, and the name of the person that took your information.  2. Exception #2 (Planned Surgery): In the event that you are scheduled by another doctor or dentist to have any type of surgery or procedure, you are allowed (for a period no longer than 30 days), to receive additional pain medication, for the acute post-op pain. However, in this case, you are responsible for picking up a copy of our "Post-op Pain Management for Surgeons" handout, and giving it to your surgeon or dentist. This document is available at our office, and does not require an appointment to obtain it. Simply go to our office during business hours (Monday-Thursday from 8:00 AM to 4:00 PM) (Friday 8:00 AM to 12:00 Noon) or if you have a scheduled appointment with [place], prior to your surgery, and ask for it by name. In addition, you will need to provide [place] with your name, name of your surgeon, type of surgery, and date of procedure or surgery.  *Opioid medications include: morphine, codeine, oxycodone, oxymorphone, hydrocodone, hydromorphone, meperidine, tramadol, tapentadol, buprenorphine, fentanyl, methadone. **Benzodiazepine medications include: diazepam (Valium), alprazolam (Xanax), clonazepam (Klonopine), lorazepam (Ativan), clorazepate (Tranxene), chlordiazepoxide (Librium), estazolam  (Prosom), oxazepam (Serax), temazepam (Restoril), triazolam (Halcion) (Last updated: 12/30/2017) ____________________________________________________________________________________________   BMI Assessment: Estimated body mass index is 32.42 kg/m as calculated from the following:   Height as of this encounter: '5\' 7"'$  (1.702 m).   Weight as of this encounter: 207 lb (93.9 kg).  BMI interpretation table: BMI level Category Range association with higher incidence of chronic pain  <18 kg/m2 Underweight   18.5-24.9 kg/m2 Ideal body weight   25-29.9 kg/m2 Overweight Increased incidence by 20%  30-34.9 kg/m2 Obese (Class I) Increased incidence by 68%  35-39.9 kg/m2 Severe obesity (Class II) Increased incidence by 136%  >40 kg/m2 Extreme obesity (Class III) Increased incidence by 254%   Patient's current BMI Ideal Body weight  Body mass index is 32.42 kg/m. Ideal body weight: 61.6 kg (135 lb 12.9 oz) Adjusted ideal body weight: 74.5 kg (164 lb 4.5 oz)   BMI Readings from Last 4 Encounters:  10/05/18 32.42 kg/m  07/06/18 31.48 kg/m  03/16/18 31.79 kg/m  12/21/17 32.58 kg/m   Wt Readings from Last 4 Encounters:  10/05/18 207 lb (93.9 kg)  07/06/18 201 lb (91.2 kg)  03/16/18 203 lb (92.1 kg)  12/21/17 208 lb (94.3  kg)

## 2018-10-05 NOTE — Progress Notes (Signed)
Nursing Pain Medication Assessment:  Safety precautions to be maintained throughout the outpatient stay will include: orient to surroundings, keep bed in low position, maintain call bell within reach at all times, provide assistance with transfer out of bed and ambulation.  Medication Inspection Compliance: Pill count conducted under aseptic conditions, in front of the patient. Neither the pills nor the bottle was removed from the patient's sight at any time. Once count was completed pills were immediately returned to the patient in their original bottle.  Medication: Hydrocodone/APAP Pill/Patch Count: 6 of 45 pills remain Pill/Patch Appearance: Markings consistent with prescribed medication Bottle Appearance: Standard pharmacy container. Clearly labeled. Filled Date: 40 / 08 / 2019 Last Medication intake:  Today

## 2018-10-05 NOTE — Patient Instructions (Addendum)
Hydrocodone with acetaminophen to last until 01/07/2019, gabapentin, flexeril, meloxicam, melatonin, trazadone and kenalog ointment have been escribed to your pharmacy.____________________________________________________________________________________________  Medication Rules  Purpose: To inform patients, and their family members, of our rules and regulations.  Applies to: All patients receiving prescriptions (written or electronic).  Pharmacy of record: Pharmacy where electronic prescriptions will be sent. If written prescriptions are taken to a different pharmacy, please inform the nursing staff. The pharmacy listed in the electronic medical record should be the one where you would like electronic prescriptions to be sent.  Electronic prescriptions: In compliance with the [place] (STOP) Act of 2017 (Session Lanny Cramp [phone]), effective November 02, 2018, all controlled substances must be electronically prescribed. Calling prescriptions to the pharmacy will cease to exist.  Prescription refills: Only during scheduled appointments. Applies to all prescriptions.  NOTE: The following applies primarily to controlled substances (Opioid* Pain Medications).   Patient's responsibilities: 1. Pain Pills: Bring all pain pills to every appointment (except for procedure appointments). 2. Pill Bottles: Bring pills in original pharmacy bottle. Always bring the newest bottle. Bring bottle, even if empty. 3. Medication refills: You are responsible for knowing and keeping track of what medications you take and those you need refilled. The day before your appointment: write a list of all prescriptions that need to be refilled. The day of the appointment: give the list to the admitting nurse. Prescriptions will be written only during appointments. If you forget a medication: it will not be "Called in", "Faxed", or "electronically sent". You will need to get another  appointment to get these prescribed. No early refills. Do not call asking to have your prescription filled early. 4. Prescription Accuracy: You are responsible for carefully inspecting your prescriptions before leaving our office. Have the discharge nurse carefully go over each prescription with you, before taking them home. Make sure that your name is accurately spelled, that your address is correct. Check the name and dose of your medication to make sure it is accurate. Check the number of pills, and the written instructions to make sure they are clear and accurate. Make sure that you are given enough medication to last until your next medication refill appointment. 5. Taking Medication: Take medication as prescribed. When it comes to controlled substances, taking less pills or less frequently than prescribed is permitted and encouraged. Never take more pills than instructed. Never take medication more frequently than prescribed.  6. Inform other Doctors: Always inform, all of your healthcare providers, of all the medications you take. 7. Pain Medication from other Providers: You are not allowed to accept any additional pain medication from any other Doctor or Healthcare provider. There are two exceptions to this rule. (see below) In the event that you require additional pain medication, you are responsible for notifying us, as stated below. 8. Medication Agreement: You are responsible for carefully reading and following our Medication Agreement. This must be signed before receiving any prescriptions from our practice. Safely store a copy of your signed Agreement. Violations to the Agreement will result in no further prescriptions. (Additional copies of our Medication Agreement are available upon request.) 9. Laws, Rules, & Regulations: All patients are expected to follow all Federal and [employer], [employer], Rules, [employer]. Ignorance of the Laws does not constitute a valid excuse. The use of any  illegal substances is prohibited. 10. Adopted CDC guidelines & recommendations: Target dosing levels will be at or below 60 MME/day. Use of benzodiazepines** is not recommended.  Exceptions: There are only two exceptions to the rule of not receiving pain medications from other Healthcare Providers. 1. Exception #1 (Emergencies): In the event of an emergency (i.e.: accident requiring emergency care), you are allowed to receive additional pain medication. However, you are responsible for: As soon as you are able, call our office [phone], at any time of the day or night, and leave a message stating your name, the date and nature of the emergency, and the name and dose of the medication prescribed. In the event that your call is answered by a member of our staff, make sure to document and save the date, time, and the name of the person that took your information.  2. Exception #2 (Planned Surgery): In the event that you are scheduled by another doctor or dentist to have any type of surgery or procedure, you are allowed (for a period no longer than 30 days), to receive additional pain medication, for the acute post-op pain. However, in this case, you are responsible for picking up a copy of our "Post-op Pain Management for Surgeons" handout, and giving it to your surgeon or dentist. This document is available at our office, and does not require an appointment to obtain it. Simply go to our office during business hours (Monday-Thursday from 8:00 AM to 4:00 PM) (Friday 8:00 AM to 12:00 Noon) or if you have a scheduled appointment with [place], prior to your surgery, and ask for it by name. In addition, you will need to provide [place] with your name, name of your surgeon, type of surgery, and date of procedure or surgery.  *Opioid medications include: morphine, codeine, oxycodone, oxymorphone, hydrocodone, hydromorphone, meperidine, tramadol, tapentadol, buprenorphine, fentanyl, methadone. **Benzodiazepine medications  include: diazepam (Valium), alprazolam (Xanax), clonazepam (Klonopine), lorazepam (Ativan), clorazepate (Tranxene), chlordiazepoxide (Librium), estazolam (Prosom), oxazepam (Serax), temazepam (Restoril), triazolam (Halcion) (Last updated: 12/30/2017) ____________________________________________________________________________________________   BMI Assessment: Estimated body mass index is 32.42 kg/m as calculated from the following:   Height as of this encounter: 5\' 7"  (1.702 m).   Weight as of this encounter: 207 lb (93.9 kg).  BMI interpretation table: BMI level Category Range association with higher incidence of chronic pain  <18 kg/m2 Underweight   18.5-24.9 kg/m2 Ideal body weight   25-29.9 kg/m2 Overweight Increased incidence by 20%  30-34.9 kg/m2 Obese (Class I) Increased incidence by 68%  35-39.9 kg/m2 Severe obesity (Class II) Increased incidence by 136%  >40 kg/m2 Extreme obesity (Class III) Increased incidence by 254%   Patient's current BMI Ideal Body weight  Body mass index is 32.42 kg/m. Ideal body weight: 61.6 kg (135 lb 12.9 oz) Adjusted ideal body weight: 74.5 kg (164 lb 4.5 oz)   BMI Readings from Last 4 Encounters:  10/05/18 32.42 kg/m  07/06/18 31.48 kg/m  03/16/18 31.79 kg/m  12/21/17 32.58 kg/m   Wt Readings from Last 4 Encounters:  10/05/18 207 lb (93.9 kg)  07/06/18 201 lb (91.2 kg)  03/16/18 203 lb (92.1 kg)  12/21/17 208 lb (94.3 kg)

## 2018-12-27 ENCOUNTER — Encounter: Payer: Self-pay | Admitting: Nurse Practitioner

## 2018-12-28 ENCOUNTER — Ambulatory Visit: Payer: No Typology Code available for payment source | Attending: Nurse Practitioner | Admitting: Nurse Practitioner

## 2018-12-28 ENCOUNTER — Other Ambulatory Visit: Payer: Self-pay

## 2018-12-28 ENCOUNTER — Encounter: Payer: Self-pay | Admitting: Nurse Practitioner

## 2018-12-28 VITALS — BP 132/69 | HR 73 | Temp 98.2°F | Resp 16 | Ht 67.0 in | Wt 207.0 lb

## 2018-12-28 DIAGNOSIS — G894 Chronic pain syndrome: Secondary | ICD-10-CM | POA: Diagnosis present

## 2018-12-28 DIAGNOSIS — G8929 Other chronic pain: Secondary | ICD-10-CM | POA: Diagnosis not present

## 2018-12-28 DIAGNOSIS — M792 Neuralgia and neuritis, unspecified: Secondary | ICD-10-CM | POA: Diagnosis present

## 2018-12-28 DIAGNOSIS — G4701 Insomnia due to medical condition: Secondary | ICD-10-CM | POA: Insufficient documentation

## 2018-12-28 DIAGNOSIS — M7918 Myalgia, other site: Secondary | ICD-10-CM | POA: Diagnosis present

## 2018-12-28 DIAGNOSIS — M4316 Spondylolisthesis, lumbar region: Secondary | ICD-10-CM | POA: Diagnosis not present

## 2018-12-28 DIAGNOSIS — Z79891 Long term (current) use of opiate analgesic: Secondary | ICD-10-CM

## 2018-12-28 DIAGNOSIS — M62830 Muscle spasm of back: Secondary | ICD-10-CM | POA: Diagnosis present

## 2018-12-28 MED ORDER — MELATONIN 10 MG PO CAPS: 10.0000 mg | ORAL_CAPSULE | Freq: Every day | ORAL | 2 refills | Status: DC

## 2018-12-28 MED ORDER — MELOXICAM 15 MG PO TABS: 15.0000 mg | ORAL_TABLET | Freq: Every morning | ORAL | 0 refills | Status: DC

## 2018-12-28 MED ORDER — GABAPENTIN 300 MG PO CAPS: 300.0000 mg | ORAL_CAPSULE | Freq: Four times a day (QID) | ORAL | 2 refills | Status: DC

## 2018-12-28 MED ORDER — HYDROCODONE-ACETAMINOPHEN 10-325 MG PO TABS: 1.0000 | ORAL_TABLET | Freq: Two times a day (BID) | ORAL | 0 refills | Status: DC | PRN

## 2018-12-28 MED ORDER — CYCLOBENZAPRINE HCL 10 MG PO TABS: 10.0000 mg | ORAL_TABLET | Freq: Every day | ORAL | 0 refills | Status: DC

## 2018-12-28 MED ORDER — TRIAMCINOLONE ACETONIDE 0.1 % EX OINT: TOPICAL_OINTMENT | Freq: Every evening | CUTANEOUS | 2 refills | Status: DC

## 2018-12-28 MED ORDER — TRAZODONE HCL 100 MG PO TABS: 50.0000 mg | ORAL_TABLET | Freq: Every evening | ORAL | 0 refills | Status: DC | PRN

## 2018-12-28 NOTE — Progress Notes (Signed)
Nursing Pain Medication Assessment:  Safety precautions to be maintained throughout the outpatient stay will include: orient to surroundings, keep bed in low position, maintain call bell within reach at all times, provide assistance with transfer out of bed and ambulation.  Medication Inspection Compliance: Pill count conducted under aseptic conditions, in front of the patient. Neither the pills nor the bottle was removed from the patient's sight at any time. Once count was completed pills were immediately returned to the patient in their original bottle.  Medication: Hydrocodone/APAP Pill/Patch Count: 13.5 of 45 pills remain Pill/Patch Appearance: Markings consistent with prescribed medication Bottle Appearance: Standard pharmacy container. Clearly labeled. Filled Date: 02 / 06 / 2020 Last Medication intake:  Today

## 2018-12-28 NOTE — Progress Notes (Signed)
Patient's Name: Stephanie [name]  MRN: [id number]  Referring Provider: [facility]  DOB: Oct 30, 1961  PCP: [facility]  DOS: 12/28/2018  Note by: Dionisio David, NP  Service setting: Ambulatory outpatient  Specialty: Interventional Pain Management  Location: [facility] (AMB) Pain Management Facility    Patient type: Established   HPI  Reason for Visit: [name] is a 58 y.o. year old, female patient, who comes today with a chief complaint of Back Pain (lower, left is worse ) and Knee Pain (bilateral ) Last Appointment: Her last appointment at our practice was on 10/05/2018. I last saw her on 10/05/2018.  Pain Assessment: Today, [name] describes the severity of the Chronic pain as a 3 /10. She indicates the location/referral of the pain to be Back Lower, Left/occassionally down the legs but not so much currently . Onset was: More than a month ago. The quality of pain is described as Discomfort, Constant, Sharp, Burning. Temporal description, or timing of pain is: Constant. Possible modifying factors: medications help. [name]'s  height is _0  (1.702 m) and weight is 207 lb (93.9 kg). Her oral temperature is 98.2 F (36.8 C). Her blood pressure is 132/69 and her pulse is 73. Her respiration is 16 and oxygen saturation is 97%.  She denis any changes in her pain. She does admit that once using the Norco and the Gabapentin she notices a short time of right neck pain.  She feels like it goes into the top of her shoulder but not last long its just weird.   Controlled Substance Pharmacotherapy Assessment REMS (Risk Evaluation and Mitigation Strategy)  Analgesic:Hydrocodone/acetaminophen 10/325 mg twice daily MME/day:21m/day.  PJanett Billow RN  12/28/2018  9:38 AM  Sign when Signing Visit Nursing Pain Medication Assessment:  Safety precautions to be maintained throughout the outpatient stay will include: orient to surroundings, keep bed in low  position, maintain call bell within reach at all times, provide assistance with transfer out of bed and ambulation.  Medication Inspection Compliance: Pill count conducted under aseptic conditions, in front of the patient. Neither the pills nor the bottle was removed from the patient's sight at any time. Once count was completed pills were immediately returned to the patient in their original bottle.  Medication: Hydrocodone/APAP Pill/Patch Count: 13.5 of 45 pills remain Pill/Patch Appearance: Markings consistent with prescribed medication Bottle Appearance: Standard pharmacy container. Clearly labeled. Filled Date: 02 / 06 / 2020 Last Medication intake:  Today   Pharmacokinetics: Liberation and absorption (onset of action): WNL Distribution (time to peak effect): WNL Metabolism and excretion (duration of action): WNL         Pharmacodynamics: Desired effects: Analgesia: Ms. TSheryn Bisonreports >50% benefit. Functional ability: Patient reports that medication allows her to accomplish basic ADLs Clinically meaningful improvement in function (CMIF): Sustained CMIF goals met Perceived effectiveness: Described as relatively effective, allowing for increase in activities of daily living (ADL) Undesirable effects: Side-effects or Adverse reactions: None reported Monitoring: [place] PMP: Online review of the past 143-montheriod conducted. Compliant with practice rules and regulations Last UDS on record: Summary  Date Value Ref Range Status  03/16/2018 FINAL  Final    Comment:    ==================================================================== TOXASSURE SESELECT 67MW) ==================================================================== Test                             Result       Flag  Units Drug Present and Declared for Prescription Verification   Hydrocodone                    278          EXPECTED   ng/mg creat   Hydromorphone                  137          EXPECTED   ng/mg creat    Dihydrocodeine                 80           EXPECTED   ng/mg creat   Norhydrocodone                 875          EXPECTED   ng/mg creat    Sources of hydrocodone include scheduled prescription    medications. Hydromorphone, dihydrocodeine and norhydrocodone are    expected metabolites of hydrocodone. Hydromorphone and    dihydrocodeine are also available as scheduled prescription    medications. ==================================================================== Test                      Result    Flag   Units      Ref Range   Creatinine              110              mg/dL      >=20 ==================================================================== Declared Medications:  The flagging and interpretation on this report are based on the  following declared medications.  Unexpected results may arise from  inaccuracies in the declared medications.  **Note: The testing scope of this panel includes these medications:  Hydrocodone (Hydrocodone-Acetaminophen)  **Note: The testing scope of this panel does not include following  reported medications:  Acetaminophen (Hydrocodone-Acetaminophen)  Cyclobenzaprine  Gabapentin  Melatonin  Meloxicam  Trazodone  Triamcinolone acetonide ==================================================================== For clinical consultation, please call [phone]. ====================================================================    UDS interpretation: Compliant          Medication Assessment Form: Reviewed. Patient indicates being compliant with therapy Treatment compliance: Compliant Risk Assessment Profile: Aberrant behavior: See initial evaluations. None observed or detected today Comorbid factors increasing risk of overdose: See initial evaluation. No additional risks detected today Opioid risk tool (ORT):  Opioid Risk  12/28/2018  Alcohol 0  Illegal Drugs 0  Rx Drugs 0  Alcohol 0  Illegal Drugs 0  Rx Drugs 0  Age between 16-45 years  0   History of Preadolescent Sexual Abuse -  Psychological Disease 0  ADD -  OCD -  Bipolar -  Depression 0  Opioid Risk Tool Scoring 0  Opioid Risk Interpretation Low Risk    ORT Scoring interpretation table:  Score <3 = Low Risk for SUD  Score between 4-7 = Moderate Risk for SUD  Score >8 = High Risk for Opioid Abuse   Risk of substance use disorder (SUD): Low  Risk Mitigation Strategies:  Patient Counseling: Covered Patient-Prescriber Agreement (PPA): Present and active  Notification to other healthcare providers: Done  Pharmacologic Plan: No change in therapy, at this time.             ROS  Constitutional: Denies any fever or chills Gastrointestinal: No reported hemesis, hematochezia, vomiting, or acute GI distress Musculoskeletal: Denies any acute onset joint swelling, redness, loss of ROM, or weakness Neurological: No reported episodes  of acute onset apraxia, aphasia, dysarthria, agnosia, amnesia, paralysis, loss of coordination, or loss of consciousness  Medication Review  HYDROcodone-acetaminophen, Melatonin, cyclobenzaprine, gabapentin, meloxicam, traZODone, and triamcinolone ointment  History Review  Allergy: [name] is allergic to phenergan [promethazine hcl]. Drug: [name]  reports no history of drug use. Alcohol:  reports no history of alcohol use. Tobacco:  reports that she has never smoked. She has never used smokeless tobacco. Social: [name]  reports that she has never smoked. She has never used smokeless tobacco. She reports that she does not drink alcohol or use drugs. Medical:  has a past medical history of Allergy, Anemia, iron deficiency (04/09/2014), Arthritis, Atypical chest pain (04/09/2014), CD (contact dermatitis) (03/01/2014), Chest pain (04/09/2014), and Lumbar radicular pain (08/22/2015). Surgical: [name]  has a past surgical history that includes Back surgery; Appendectomy; Cholecystectomy; Cesarean section; and Bunionectomy. Family:  family history includes Asthma in her father; Stroke in her mother. Problem List: 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  Lab Review  Kidney Function Lab Results  Component Value Date   BUN 15 07/06/2018   CREATININE 0.47 07/06/2018   GFRAA >60 07/06/2018   GFRNONAA >60 07/06/2018  Liver Function Lab Results  Component Value Date   AST 18 07/06/2018   ALT 15 07/06/2018   ALBUMIN 4.1 07/06/2018  Note: Above Lab results reviewed.  Imaging Review  DG Knee 1-2 Views Right CLINICAL DATA:  Knee pain.  EXAM: RIGHT KNEE - 1-2 VIEW  COMPARISON:  MRI of the right knee 01/17/2014  FINDINGS: Generalized degenerative spurring with medial compartment narrowing. No evidence of fracture or bone lesion. There is posterior translation of the tibial plateau relative to the distal femur. Negative for joint effusion.  IMPRESSION: 1. 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 2. Posterior translation of the tibial plateau relative to the distal femur, please correlate  with drawer test.  Electronically Signed   By: Monte Fantasia M.D.   On: 12/21/2017 17:04 DG Knee 1-2 Views Left CLINICAL DATA:  Bilateral knee pain.  EXAM: LEFT KNEE - 1-2 VIEW  COMPARISON:  MRI of the left knee 01/07/2014  FINDINGS: Degenerative marginal spurring with moderate to advanced medial compartment narrowing. No fracture deformity or evidence of bone lesion. Negative for joint effusion.  IMPRESSION: Tricompartmental degenerative spurring with medial compartment narrowing.  Electronically Signed   By: Monte Fantasia M.D.   On: 12/21/2017 17:02 Note: Reviewed        Physical Exam  General appearance: Well nourished, well developed, and well hydrated. In no apparent acute distress Mental status: Alert, oriented x 3 (person, place, & time)       Respiratory: No evidence of acute respiratory distress Eyes: PERLA Vitals: BP 132/69 (BP Location: Left Arm, Patient Position: Sitting, Cuff  Size: Normal)   Pulse 73   Temp 98.2 F (36.8 C) (Oral)   Resp 16   Ht _0  (1.702 m)   Wt 207 lb (93.9 kg)   LMP  (LMP Unknown)   SpO2 97%   BMI 32.42 kg/m  BMI: Estimated body mass index is 32.42 kg/m as calculated from the following:   Height as of this encounter: _1  (1.702 m).   Weight as of this encounter: 207 lb (93.9 kg). Ideal: Ideal body weight: 61.6 kg (135 lb 12.9 oz) Adjusted ideal body weight: 74.5 kg (164 lb 4.5 oz) Lumbar Spine Area Exam  Skin & Axial Inspection: No masses, redness, or swelling Alignment: Symmetrical Functional ROM: Unrestricted ROM       Stability: No instability detected Muscle Tone/Strength: Functionally intact. No obvious neuro-muscular anomalies detected. Sensory (Neurological): Unimpaired Palpation: No palpable anomalies        Gait & Posture Assessment  Ambulation: Unassisted Gait: Relatively normal for age and body habitus Posture: WNL  Lower Extremity Exam    Side: Right lower extremity  Side: Left lower extremity   Stability: No instability observed          Stability: No instability observed          Skin & Extremity Inspection: Skin color, temperature, and hair growth are WNL. No peripheral edema or cyanosis. No masses, redness, swelling, asymmetry, or associated skin lesions. No contractures.  Skin & Extremity Inspection: Skin color, temperature, and hair growth are WNL. No peripheral edema or cyanosis. No masses, redness, swelling, asymmetry, or associated skin lesions. No contractures.  Functional ROM: Unrestricted ROM                  Functional ROM: Unrestricted ROM                  Muscle Tone/Strength: Functionally intact. No obvious neuro-muscular anomalies detected.  Muscle Tone/Strength: Functionally intact. No obvious neuro-muscular anomalies detected.  Sensory (Neurological): Unimpaired        Sensory (Neurological): Unimpaired        Palpation: No palpable anomalies  Palpation: No palpable anomalies   Assessment   Status Diagnosis  Controlled Controlled Controlled 1. Spondylolisthesis of lumbar region   2. Insomnia secondary to chronic pain   3. Musculoskeletal pain   4. Spasm of paraspinal muscle   5. Neurogenic pain   6. Chronic pain syndrome   7. Long term prescription opiate use      Updated Problems: No problems updated.  Plan of Care  Pharmacotherapy (Medications Ordered): Meds ordered this encounter  Medications  . Melatonin ([facility] MELATONIN) 10 MG CAPS    Sig: Take 10-20 mg by mouth at bedtime.    Dispense:  60 capsule    Refill:  2    Do not place medication on "Automatic Refill". Fill one day early if pharmacy is closed on scheduled refill date.    Order Specific Question:   Supervising Provider    Answer:   Milinda Pointer [phone]  . cyclobenzaprine (FLEXERIL) 10 MG tablet    Sig: Take 1 tablet (10 mg total) by mouth daily.    Dispense:  90 tablet    Refill:  0    Do not place this medication, or any other prescription from our practice, on "Automatic  Refill". Patient may have prescription filled one day early if pharmacy is closed on scheduled refill date.    Order Specific Question:   Supervising Provider    Answer:  Ong, [place] gabapentin (NEURONTIN) 300 MG capsule    Sig: Take 1-3 capsules (300-900 mg total) by mouth 4 (four) times daily.    Dispense:  360 capsule    Refill:  2    Do not place this medication, or any other prescription from our practice, on "Automatic Refill". Patient may have prescription filled one day early if pharmacy is closed on scheduled refill date.    Order Specific Question:   Supervising Provider    Answer:   Milinda Pointer [phone]  . HYDROcodone-acetaminophen (NORCO) 10-325 MG tablet    Sig: Take 1 tablet by mouth every 12 (twelve) hours as needed for up to 30 days for severe pain.    Dispense:  45 tablet    Refill:  0    Do not place this medication, or any other prescription from our practice, on "Automatic Refill". Patient may have prescription filled one day early if pharmacy is closed on scheduled refill date.    Order Specific Question:   Supervising Provider    Answer:   Milinda Pointer [phone]  . meloxicam (MOBIC) 15 MG tablet    Sig: Take 1 tablet (15 mg total) by mouth every morning.    Dispense:  90 tablet    Refill:  0    Do not place this medication, or any other prescription from our practice, on "Automatic Refill". Patient may have prescription filled one day early if pharmacy is closed on scheduled refill date.    Order Specific Question:   Supervising Provider    Answer:   Milinda Pointer [phone]  . traZODone (DESYREL) 100 MG tablet    Sig: Take 0.5 tablets (50 mg total) by mouth at bedtime as needed for sleep.    Dispense:  45 tablet    Refill:  0    Do not place medication on "Automatic Refill". Fill one day early if pharmacy is closed on scheduled refill date.    Order Specific Question:   Supervising Provider    Answer:   Milinda Pointer  [phone]  . triamcinolone ointment (KENALOG) 0.1 %    Sig: Apply topically Nightly.    Dispense:  30 g    Refill:  2    Order Specific Question:   Supervising Provider    Answer:   Milinda Pointer [phone]  . HYDROcodone-acetaminophen (NORCO) 10-325 MG tablet    Sig: Take 1 tablet by mouth every 12 (twelve) hours as needed for up to 30 days for severe pain.    Dispense:  45 tablet    Refill:  0    Do not place this medication, or any other prescription from our practice, on "Automatic Refill". Patient may have prescription filled one day early if pharmacy is closed on scheduled refill date.    Order Specific Question:   Supervising Provider    Answer:   Milinda Pointer [phone]  . HYDROcodone-acetaminophen (NORCO) 10-325 MG tablet    Sig: Take 1 tablet by mouth every 12 (twelve) hours as needed for up to 30 days for severe pain.    Dispense:  45 tablet    Refill:  0    Do not place this medication, or any other prescription from our practice, on "Automatic Refill". Patient may have prescription filled one day early if pharmacy is closed on scheduled refill date.    Order Specific Question:   Supervising Provider    Answer:   Milinda Pointer [[id number]]   Administered today: Barbaraann Rondo. [name]  had no medications administered during this visit.  Orders:  Orders Placed This Encounter  Procedures  . ToxASSURE Select 13 (MW), Urine    Volume: 30 ml(s). Minimum 3 ml of urine is needed. Document temperature of fresh sample. Indications: Long term (current) use of opiate analgesic (J64.383)   Interventional options: Planned follow-up:   Not at this time.  Plan: No follow-ups on file.   Considering:  Diagnostic bilateral lumbar facet block Possible bilateral lumbar facet RFA Diagnostic Caudal ESI Diagnostic Bilateral L4-5 & L5-S1Lumbar TFESI Diagnostic bilateral sacroiliac joint block Possible bilateral sacroiliac joint RFA Diagnostic bilateral hip joint injection Possible  bilateral hip joint RFA Diagnostic bilateral knee joint injection Possible series of Hyalgan knee injections Diagnostic bilateral genicular nerve block Possible bilateral genicular nerve RFA.   Palliative PRN treatment(s):  PalliativeCaudal ESI   Note by: Dionisio David, NP Date: 12/28/2018; Time: 11:10 AM

## 2018-12-31 LAB — TOXASSURE SELECT 13 (MW), URINE

## 2019-03-28 ENCOUNTER — Encounter: Payer: Self-pay | Admitting: Pain Medicine

## 2019-03-28 NOTE — Progress Notes (Signed)
Pain Management Virtual Encounter Note - Virtual Visit via Telephone Telehealth (real-time audio visits between healthcare provider and patient).  Patient's Phone No. & Preferred Pharmacy:  [phone] (home); There is no such number on file (mobile).; (Preferred) [phone] No e-mail address on record  [id number] - [place], [place] [address] Phone: [phone] Fax: [phone]   Pre-screening note:  Our staff contacted Stephanie [name] and offered her an "in person", "face-to-face" appointment versus a telephone encounter. She indicated preferring the telephone encounter, at this time.  Reason for Virtual Visit: COVID-19*  Social distancing based on CDC and AMA recommendations.   I contacted Stephanie [name] on 03/29/2019 at 8:40 AM via telephone.      I clearly identified myself as Gaspar Cola, MD. I verified that I was speaking with the correct person using two identifiers (Name and date of birth: 1961-06-23).  Advanced Informed Consent I sought verbal advanced consent from Stephanie [name] for virtual visit interactions. I informed Stephanie [name] of possible security and privacy concerns, risks, and limitations associated with providing "not-in-person" medical evaluation and management services. I also informed Stephanie [name] of the availability of "in-person" appointments. Finally, I informed her that there would be a charge for the virtual visit and that she could be  personally, fully or partially, financially responsible for it. Stephanie [name] expressed understanding and agreed to proceed.   Historic Elements   Stephanie [name] is a 58 y.o. year old, female patient evaluated today after her last encounter by our practice on 12/28/2018. Stephanie [name]  has a past medical history of Allergy, Anemia, iron deficiency (04/09/2014), Arthritis, Atypical chest pain (04/09/2014), CD (contact dermatitis) (03/01/2014), Chest pain (04/09/2014), and  Lumbar radicular pain (08/22/2015). She also  has a past surgical history that includes Back surgery; Appendectomy; Cholecystectomy; Cesarean section; and Bunionectomy. Stephanie [name] has a current medication list which includes the following prescription(s): cyclobenzaprine, gabapentin, hydrocodone-acetaminophen, hydrocodone-acetaminophen, hydrocodone-acetaminophen, melatonin, meloxicam, and triamcinolone ointment. She  reports that she has never smoked. She has never used smokeless tobacco. She reports that she does not drink alcohol or use drugs. Stephanie [name] is allergic to phenergan [promethazine hcl].   HPI  I last communicated with her on Visit date not found. Today, she is being contacted for medication management.  Pharmacotherapy Assessment  Analgesic:Hydrocodone/acetaminophen 10/325 mg twice daily MME/day:20mg /day.   Monitoring: Pharmacotherapy: No side-effects or adverse reactions reported. [place] PMP: PDMP reviewed during this encounter.       Compliance: No problems identified. Effectiveness: Clinically acceptable. Plan: Refer to "POC".  Review of recent tests   Pertinent Labs  Renal Function Lab Results  Component Value Date   BUN 15 07/06/2018   CREATININE 0.47 07/06/2018   GFRAA >60 07/06/2018   GFRNONAA >60 07/06/2018  Hepatic Function Lab Results  Component Value Date   AST 18 07/06/2018   ALT 15 07/06/2018   ALBUMIN 4.1 07/06/2018  Note: Above Lab results reviewed.  DG Knee 1-2 Views Right CLINICAL DATA:  Knee pain.  EXAM: RIGHT KNEE - 1-2 VIEW  COMPARISON:  MRI of the right knee 01/17/2014  FINDINGS: Generalized degenerative spurring with medial compartment narrowing. No evidence of fracture or bone lesion. There is posterior translation of the tibial plateau relative to the distal femur. Negative for joint effusion.  IMPRESSION: 1. 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 2. Posterior translation of the tibial plateau  relative to the distal femur, please correlate with drawer test.  Electronically Signed   By: Monte Fantasia M.D.   On: 12/21/2017 17:04 DG Knee 1-2 Views Left CLINICAL DATA:  Bilateral knee pain.  EXAM: LEFT KNEE - 1-2 VIEW  COMPARISON:  MRI of the left knee 01/07/2014  FINDINGS: Degenerative marginal spurring with moderate to advanced medial compartment narrowing. No fracture deformity or evidence of bone lesion. Negative for joint effusion.  IMPRESSION: Tricompartmental degenerative spurring with medial compartment narrowing.  Electronically Signed   By: Monte Fantasia M.D.   On: 12/21/2017 17:02   Office Visit on 12/28/2018  Component Date Value Ref Range Status  . Summary 12/28/2018 FINAL   Final   Comment: ==================================================================== TOXASSURE SELECT 13 (MW) ==================================================================== Test                             Result       Flag       Units Drug Present   Hydrocodone                    1704                    ng/mg creat   Hydromorphone                  496                     ng/mg creat   Dihydrocodeine                 155                     ng/mg creat   Norhydrocodone                 >1992                   ng/mg creat    Sources of hydrocodone include scheduled prescription    medications. Hydromorphone, dihydrocodeine and norhydrocodone are    expected metabolites of hydrocodone. Hydromorphone and    dihydrocodeine are also available as scheduled prescription    medications. ==================================================================== Test                      Result    Flag   Units      Ref Range   Creatinine              251              mg/dL      >=20 =============                          ======================================================= Declared Medications:  Medication list was not  provided. ==================================================================== For clinical consultation, please call [phone]. ====================================================================    Assessment  The primary encounter diagnosis was Chronic pain syndrome. 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  Plan of Care  I have discontinued Stephanie [name]'s triamcinolone ointment, HYDROcodone-acetaminophen, traZODone, HYDROcodone-acetaminophen, and HYDROcodone-acetaminophen. I have also changed her HYDROcodone-acetaminophen and triamcinolone ointment. Additionally,  I am having her start on HYDROcodone-acetaminophen and HYDROcodone-acetaminophen. Lastly, I am having her maintain her Melatonin, cyclobenzaprine, gabapentin, and meloxicam.  Pharmacotherapy (Medications Ordered): Meds ordered this encounter  Medications  . Melatonin ([facility] MELATON[facility]) 10 MG CAPS    Sig: Take 10-20 mg by mouth at bedtime.    Dispense:  60 capsule    Refill:  2    Do not place medication on "Automatic Refill". Fill one day early if pharmacy is closed on scheduled refill date.  Marland Kitchen HYDROcodone-acetaminophen (NORCO) 10-325 MG tablet    Sig: Take 1 tablet by mouth every 12 (twelve) hours as needed for up to 30 days for severe pain. Must last 30 days    Dispense:  45 tablet    Refill:  0    Chronic Pain. (STOP Act - Not applicable). Fill one day early if closed on scheduled refill date. Do not fill until: 04/07/19. To last until: 05/07/19.  Marland Kitchen cyclobenzaprine (FLEXERIL) 10 MG tablet    Sig: Take 1 tablet  (10 mg total) by mouth daily.    Dispense:  90 tablet    Refill:  0    Do not place this medication, or any other prescription from our practice, on "Automatic Refill". Patient may have prescription filled one day early if pharmacy is closed on scheduled refill date.  . gabapentin (NEURONTIN) 300 MG capsule    Sig: Take 1-3 capsules (300-900 mg total) by mouth 4 (four) times daily.    Dispense:  360 capsule    Refill:  2    Do not place this medication, or any other prescription from our practice, on "Automatic Refill". Patient may have prescription filled one day early if pharmacy is closed on scheduled refill date.  . meloxicam (MOBIC) 15 MG tablet    Sig: Take 1 tablet (15 mg total) by mouth every morning.    Dispense:  90 tablet    Refill:  0    Do not place this medication, or any other prescription from our practice, on "Automatic Refill". Patient may have prescription filled one day early if pharmacy is closed on scheduled refill date.  Marland Kitchen HYDROcodone-acetaminophen (NORCO) 10-325 MG tablet    Sig: Take 1 tablet by mouth every 12 (twelve) hours as needed for up to 30 days for severe pain. Must last 30 days    Dispense:  45 tablet    Refill:  0    Chronic Pain. (STOP Act - Not applicable). Fill one day early if closed on scheduled refill date. Do not fill until: 05/07/19. To last until: 06/06/19.  Marland Kitchen HYDROcodone-acetaminophen (NORCO) 10-325 MG tablet    Sig: Take 1 tablet by mouth every 12 (twelve) hours as needed for up to 30 days for severe pain. Must last 30 days    Dispense:  45 tablet    Refill:  0    Chronic Pain. (STOP Act - Not applicable). Fill one day early if closed on scheduled refill date. Do not fill until: 06/06/19. To last until: 07/06/19.  Marland Kitchen triamcinolone ointment (KENALOG) 0.1 %    Sig: Apply topically 3 (three) times daily.    Dispense:  80 g    Refill:  2    Fill one day early if pharmacy is closed on scheduled refill date. May substitute for generic if available.    Orders:  No orders of the defined types were placed in this encounter.  Follow-up plan:   Return in about 9 weeks (around 05/31/2019) for (3  mo) Med-Mgmt.    I discussed the assessment and treatment plan with the patient. The patient was provided an opportunity to ask questions and all were answered. The patient agreed with the plan and demonstrated an understanding of the instructions.  Patient advised to call back or seek an in-person evaluation if the symptoms or condition worsens.  Total duration of non-face-to-face encounter: 12 minutes.  Note by: Gaspar Cola, MD Date: 03/29/2019; Time: 9:11 AM  Note: This dictation was prepared with Dragon dictation. Any transcriptional errors that may result from this process are unintentional.  Disclaimer:  * Given the special circumstances of the COVID-19 pandemic, the federal government has announced that the Office for Civil Rights (OCR) will exercise its enforcement discretion and will not impose penalties on physicians using telehealth in the event of noncompliance with regulatory requirements under the [place] and [employer] (HIPAA) in connection with the good faith provision of telehealth during the PJSRP-59 national public health emergency. ([place])

## 2019-03-29 ENCOUNTER — Other Ambulatory Visit: Payer: Self-pay

## 2019-03-29 ENCOUNTER — Ambulatory Visit: Payer: Medicare Other | Attending: Nurse Practitioner | Admitting: Pain Medicine

## 2019-03-29 DIAGNOSIS — M5441 Lumbago with sciatica, right side: Secondary | ICD-10-CM | POA: Diagnosis not present

## 2019-03-29 DIAGNOSIS — G8929 Other chronic pain: Secondary | ICD-10-CM | POA: Diagnosis not present

## 2019-03-29 DIAGNOSIS — M62830 Muscle spasm of back: Secondary | ICD-10-CM

## 2019-03-29 DIAGNOSIS — M25562 Pain in left knee: Secondary | ICD-10-CM

## 2019-03-29 DIAGNOSIS — M79605 Pain in left leg: Secondary | ICD-10-CM

## 2019-03-29 DIAGNOSIS — M4316 Spondylolisthesis, lumbar region: Secondary | ICD-10-CM

## 2019-03-29 DIAGNOSIS — M961 Postlaminectomy syndrome, not elsewhere classified: Secondary | ICD-10-CM

## 2019-03-29 DIAGNOSIS — M5442 Lumbago with sciatica, left side: Secondary | ICD-10-CM

## 2019-03-29 DIAGNOSIS — M79604 Pain in right leg: Secondary | ICD-10-CM

## 2019-03-29 DIAGNOSIS — M792 Neuralgia and neuritis, unspecified: Secondary | ICD-10-CM | POA: Diagnosis not present

## 2019-03-29 DIAGNOSIS — M7918 Myalgia, other site: Secondary | ICD-10-CM

## 2019-03-29 DIAGNOSIS — G4701 Insomnia due to medical condition: Secondary | ICD-10-CM

## 2019-03-29 DIAGNOSIS — L309 Dermatitis, unspecified: Secondary | ICD-10-CM | POA: Diagnosis not present

## 2019-03-29 DIAGNOSIS — M25561 Pain in right knee: Secondary | ICD-10-CM

## 2019-03-29 DIAGNOSIS — G894 Chronic pain syndrome: Secondary | ICD-10-CM

## 2019-03-29 MED ORDER — HYDROCODONE-ACETAMINOPHEN 10-325 MG PO TABS: 1.0000 | ORAL_TABLET | Freq: Two times a day (BID) | ORAL | 0 refills | Status: DC | PRN

## 2019-03-29 MED ORDER — CYCLOBENZAPRINE HCL 10 MG PO TABS: 10.0000 mg | ORAL_TABLET | Freq: Every day | ORAL | 0 refills | Status: DC

## 2019-03-29 MED ORDER — MELOXICAM 15 MG PO TABS: 15.0000 mg | ORAL_TABLET | Freq: Every morning | ORAL | 0 refills | Status: DC

## 2019-03-29 MED ORDER — GABAPENTIN 300 MG PO CAPS: 300.0000 mg | ORAL_CAPSULE | Freq: Four times a day (QID) | ORAL | 2 refills | Status: DC

## 2019-03-29 MED ORDER — TRIAMCINOLONE ACETONIDE 0.1 % EX OINT: TOPICAL_OINTMENT | Freq: Three times a day (TID) | CUTANEOUS | 2 refills | Status: DC

## 2019-03-29 MED ORDER — MELATONIN 10 MG PO CAPS: 10.0000 mg | ORAL_CAPSULE | Freq: Every day | ORAL | 2 refills | Status: DC

## 2019-05-18 ENCOUNTER — Telehealth: Payer: Self-pay | Admitting: *Deleted

## 2019-05-18 ENCOUNTER — Encounter: Payer: Self-pay | Admitting: Pain Medicine

## 2019-05-18 NOTE — Telephone Encounter (Signed)
Attempted to call for pre appointment assessment. Mailbox is full.

## 2019-05-21 NOTE — Progress Notes (Signed)
Pain Management Virtual Encounter Note - Virtual Visit via Telephone Telehealth (real-time audio visits between healthcare provider and patient).   Patient's Phone No. & Preferred Pharmacy:  [phone] (home); There is no such number on file (mobile).; (Preferred) [phone] No e-mail address on record  [id number] - [place], [place] [address] Phone: [phone] Fax: [phone]    Pre-screening note:  Our staff contacted Stephanie [name] and offered Stephanie [name] an "in person", "face-to-face" appointment versus a telephone encounter. She indicated preferring the telephone encounter, at this time.   Reason for Virtual Visit: COVID-19*  Social distancing based on CDC and AMA recommendations.   I contacted Stephanie [name] on 05/22/2019 via telephone.      I clearly identified myself as Gaspar Cola, MD. I verified that I was speaking with the correct person using two identifiers (Name: Stephanie [name], and date of birth: 25-Jan-1961).  Advanced Informed Consent I sought verbal advanced consent from Stephanie [name] for virtual visit interactions. I informed Stephanie [name] of possible security and privacy concerns, risks, and limitations associated with providing "not-in-person" medical evaluation and management services. I also informed [name] of the availability of "in-person" appointments. Finally, I informed Stephanie [name] that there would be a charge for the virtual visit and that she could be  personally, fully or partially, financially responsible for it. [name] expressed understanding and agreed to proceed.   Historic Elements   Stephanie [name] is a 58 y.o. year old, female patient evaluated today after Stephanie [name] last encounter by our practice on 05/18/2019. Stephanie [name]  has a past medical history of Allergy, Anemia, iron deficiency (04/09/2014), Arthritis, Atypical chest pain (04/09/2014), CD (contact dermatitis) (03/01/2014), Chest  pain (04/09/2014), and Lumbar radicular pain (08/22/2015). She also  has a past surgical history that includes Back surgery; Appendectomy; Cholecystectomy; Cesarean section; and Bunionectomy. Stephanie [name] has a current medication list which includes the following prescription(s): cyclobenzaprine, gabapentin, hydrocodone-acetaminophen, hydrocodone-acetaminophen, melatonin, meloxicam, triamcinolone ointment, hydrocodone-acetaminophen, hydrocodone-acetaminophen, and hydrocodone-acetaminophen. She  reports that she has never smoked. She has never used smokeless tobacco. She reports that she does not drink alcohol or use drugs. Stephanie [name] is allergic to phenergan [promethazine hcl].   HPI  Today, she is being contacted for medication management.  Pharmacotherapy Assessment  Analgesic: Hydrocodone/acetaminophen 10/325 mg, 1 tab PO BID (20 mg/day of hydrocodone)(has enough to last until 10/04/2019) MME/day:20mg /day.   Monitoring: Pharmacotherapy: No side-effects or adverse reactions reported. [place] PMP: PDMP reviewed during this encounter.       Compliance: No problems identified. Effectiveness: Clinically acceptable. Plan: Refer to "POC".  Pertinent Labs   SAFETY SCREENING Profile No results found for: SARSCOV2NAA, COVIDSOURCE, STAPHAUREUS, MRSAPCR, HCVAB, HIV, PREGTESTUR Renal Function Lab Results  Component Value Date   BUN 15 07/06/2018   CREATININE 0.47 07/06/2018   GFRAA >60 07/06/2018   GFRNONAA >60 07/06/2018   Hepatic Function Lab Results  Component Value Date   AST 18 07/06/2018   ALT 15 07/06/2018   ALBUMIN 4.1 07/06/2018   UDS Summary  Date Value Ref Range Status  12/28/2018 FINAL  Final    Comment:    ==================================================================== TOXASSURE SELECT 13 (MW) ==================================================================== Test                             Result       Flag       Units Drug Present  Hydrocodone                     1704                    ng/mg creat   Hydromorphone                  496                     ng/mg creat   Dihydrocodeine                 155                     ng/mg creat   Norhydrocodone                 >1992                   ng/mg creat    Sources of hydrocodone include scheduled prescription    medications. Hydromorphone, dihydrocodeine and norhydrocodone are    expected metabolites of hydrocodone. Hydromorphone and    dihydrocodeine are also available as scheduled prescription    medications. ==================================================================== Test                      Result    Flag   Units      Ref Range   Creatinine              251              mg/dL      >=20 ==================================================================== Declared Medications:  Medication list was not provided. ==================================================================== For clinical consultation, please call [phone]. ====================================================================    Note: Above Lab results reviewed.  Recent imaging  DG Knee 1-2 Views Right CLINICAL DATA:  Knee pain.  EXAM: RIGHT KNEE - 1-2 VIEW  COMPARISON:  MRI of the right knee 01/17/2014  FINDINGS: Generalized degenerative spurring with medial compartment narrowing. No evidence of fracture or bone lesion. There is posterior translation of the tibial plateau relative to the distal femur. Negative for joint effusion.  IMPRESSION: 1. 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 2. Posterior translation of the tibial plateau relative to the distal femur, please correlate with drawer test.  Electronically Signed   By: Monte Fantasia M.D.   On: 12/21/2017 17:04   DG Knee 1-2 Views Left CLINICAL DATA:  Bilateral knee pain.  EXAM: LEFT KNEE - 1-2 VIEW  COMPARISON:  MRI of the left knee 01/07/2014  FINDINGS: Degenerative marginal spurring with moderate to  advanced medial compartment narrowing. No fracture deformity or evidence of bone lesion. Negative for joint effusion.  IMPRESSION: Tricompartmental degenerative spurring with medial compartment narrowing.  Electronically Signed   By: Monte Fantasia M.D.   On: 12/21/2017 17:02  Assessment  The primary encounter diagnosis was Chronic pain syndrome. Diagnoses of Chronic low back pain (Primary area of Pain) (Bilateral) (L>R), Chronic lower extremity pain (Secondary Area of Pain) (Bilateral) (L>R), Chronic knee pain (Tertiary Area of Pain) (Bilateral) (R>L), Pharmacologic therapy, Disorder of skeletal system, Problems influencing health status, B12 deficiency, Abnormal MRI, knee (04/04/2018), Insomnia secondary to chronic pain, Musculoskeletal pain, Spasm of paraspinal muscle, Neurogenic pain, and Spondylolisthesis of lumbar region were also pertinent to this visit.  Plan of Care  I have changed Stephanie [name]'s HYDROcodone-acetaminophen. I am also having Stephanie [name] start on HYDROcodone-acetaminophen and HYDROcodone-acetaminophen. Additionally, I am having  Stephanie [name] maintain Stephanie [name] HYDROcodone-acetaminophen, HYDROcodone-acetaminophen, triamcinolone ointment, Melatonin, cyclobenzaprine, gabapentin, and meloxicam.  Pharmacotherapy (Medications Ordered): Meds ordered this encounter  Medications  . HYDROcodone-acetaminophen (NORCO) 10-325 MG tablet    Sig: Take 1 tablet by mouth every 12 (twelve) hours as needed for severe pain. Must last 30 days    Dispense:  45 tablet    Refill:  0    Chronic Pain: STOP Act (Not applicable) Fill 1 day early if closed on refill date. Do not fill until: 07/06/2019. To last until: 08/05/2019. Avoid benzodiazepines within 8 hours of opioids  . HYDROcodone-acetaminophen (NORCO) 10-325 MG tablet    Sig: Take 1 tablet by mouth every 12 (twelve) hours as needed for severe pain. Must last 30 days    Dispense:  45 tablet    Refill:  0    Chronic Pain: STOP Act (Not applicable) Fill 1 day  early if closed on refill date. Do not fill until: 08/05/2019. To last until: 09/04/2019. Avoid benzodiazepines within 8 hours of opioids  . HYDROcodone-acetaminophen (NORCO) 10-325 MG tablet    Sig: Take 1 tablet by mouth every 12 (twelve) hours as needed for severe pain. Must last 30 days    Dispense:  45 tablet    Refill:  0    Chronic Pain: STOP Act (Not applicable) Fill 1 day early if closed on refill date. Do not fill until: 09/04/2019. To last until: 10/04/2019. Avoid benzodiazepines within 8 hours of opioids  . Melatonin ([facility] MELATONIN) 10 MG CAPS    Sig: Take 10-20 mg by mouth at bedtime.    Dispense:  60 capsule    Refill:  2    Fill one day early if pharmacy is closed on scheduled refill date. May substitute for generic if available.  . cyclobenzaprine (FLEXERIL) 10 MG tablet    Sig: Take 1 tablet (10 mg total) by mouth daily.    Dispense:  90 tablet    Refill:  0    Fill one day early if pharmacy is closed on scheduled refill date. May substitute for generic if available.  . gabapentin (NEURONTIN) 300 MG capsule    Sig: Take 1-3 capsules (300-900 mg total) by mouth 4 (four) times daily.    Dispense:  360 capsule    Refill:  2    Fill one day early if pharmacy is closed on scheduled refill date. May substitute for generic if available.  . meloxicam (MOBIC) 15 MG tablet    Sig: Take 1 tablet (15 mg total) by mouth every morning.    Dispense:  90 tablet    Refill:  0    Fill one day early if pharmacy is closed on scheduled refill date. May substitute for generic if available.   Orders:  Orders Placed This Encounter  Procedures  . Comp. Metabolic Panel (12)    With GFR. Indications: Chronic Pain Syndrome (G89.4) & Pharmacotherapy (G95.621)    Order Specific Question:   Has the patient fasted?    Answer:   No    Order Specific Question:   CC Results    Answer:   PCP-NURSE [[id number]]  . Magnesium    Indication: Pharmacologic therapy (Q46.962)    Order Specific Question:   CC  Results    Answer:   PCP-NURSE [[id number]]  . Vitamin B12    Indication: Pharmacologic therapy (L24.401).    Order Specific Question:   CC Results    Answer:   PCP-NURSE [[id number]]  . Sedimentation rate  Indication: Disorder of skeletal system (M89.9)    Order Specific Question:   CC Results    Answer:   PCP-NURSE [[id number]]  . 25-Hydroxyvitamin D Lcms D2+D3    Indication: Disorder of skeletal system (M89.9).    Order Specific Question:   CC Results    Answer:   PCP-NURSE [[id number]]  . C-reactive protein    Indication: Problems influencing health status (Z78.9)    Order Specific Question:   CC Results    Answer:   PCP-NURSE [[id number]]   Follow-up plan:   Return in about 4 months (around 10/04/2019) for (VV), E/M (MM).      Interventional therapies: Planned, scheduled, and/or pending:   Patient seen at [facility] in [place] for right knee pathology.   Considering:   Diagnostic bilateral lumbar facet block Possible bilateral lumbar facet RFA Diagnostic Caudal ESI Diagnostic Bilateral L4-5 & L5-S1Lumbar TFESI Diagnostic bilateral sacroiliac joint block Possible bilateral sacroiliac joint RFA Diagnostic bilateral hip joint injection Possible bilateral hip joint RFA Diagnostic bilateral knee joint injection Possible series of Hyalgan knee injections Diagnostic bilateral genicular nerve block Possible bilateral genicular nerve RFA.   Palliative PRN treatment(s):   Palliative Caudal ESI    Recent Visits Date Type Provider Dept  03/29/19 Office Visit Milinda Pointer, MD [facility]  Showing recent visits within past 90 days and meeting all other requirements   Today's Visits Date Type Provider Dept  05/22/19 Office Visit Milinda Pointer, MD [facility]  Showing today's visits and meeting all other requirements   Future Appointments No visits were found meeting these conditions.  Showing future appointments within next 90 days and meeting all other  requirements   I discussed the assessment and treatment plan with the patient. The patient was provided an opportunity to ask questions and all were answered. The patient agreed with the plan and demonstrated an understanding of the instructions.  Patient advised to call back or seek an in-person evaluation if the symptoms or condition worsens.  Total duration of non-face-to-face encounter: 12 minutes.  Note by: Gaspar Cola, MD Date: 05/22/2019; Time: 10:49 AM  Note: This dictation was prepared with Dragon dictation. Any transcriptional errors that may result from this process are unintentional.  Disclaimer:  * Given the special circumstances of the COVID-19 pandemic, the federal government has announced that the Office for Civil Rights (OCR) will exercise its enforcement discretion and will not impose penalties on physicians using telehealth in the event of noncompliance with regulatory requirements under the [place] (HIPAA) in connection with the good faith provision of telehealth during the BZJIR-67 national public health emergency. ([place])

## 2019-05-22 ENCOUNTER — Ambulatory Visit: Payer: Medicare Other | Attending: Pain Medicine | Admitting: Pain Medicine

## 2019-05-22 ENCOUNTER — Other Ambulatory Visit: Payer: Self-pay

## 2019-05-22 DIAGNOSIS — M25561 Pain in right knee: Secondary | ICD-10-CM | POA: Diagnosis not present

## 2019-05-22 DIAGNOSIS — G8929 Other chronic pain: Secondary | ICD-10-CM

## 2019-05-22 DIAGNOSIS — M899 Disorder of bone, unspecified: Secondary | ICD-10-CM | POA: Diagnosis not present

## 2019-05-22 DIAGNOSIS — E538 Deficiency of other specified B group vitamins: Secondary | ICD-10-CM | POA: Diagnosis not present

## 2019-05-22 DIAGNOSIS — M7918 Myalgia, other site: Secondary | ICD-10-CM | POA: Diagnosis not present

## 2019-05-22 DIAGNOSIS — M5441 Lumbago with sciatica, right side: Secondary | ICD-10-CM

## 2019-05-22 DIAGNOSIS — M25562 Pain in left knee: Secondary | ICD-10-CM

## 2019-05-22 DIAGNOSIS — M5442 Lumbago with sciatica, left side: Secondary | ICD-10-CM

## 2019-05-22 DIAGNOSIS — G894 Chronic pain syndrome: Secondary | ICD-10-CM

## 2019-05-22 DIAGNOSIS — M62830 Muscle spasm of back: Secondary | ICD-10-CM | POA: Diagnosis not present

## 2019-05-22 DIAGNOSIS — G4701 Insomnia due to medical condition: Secondary | ICD-10-CM

## 2019-05-22 DIAGNOSIS — Z79899 Other long term (current) drug therapy: Secondary | ICD-10-CM

## 2019-05-22 DIAGNOSIS — Z789 Other specified health status: Secondary | ICD-10-CM | POA: Diagnosis not present

## 2019-05-22 DIAGNOSIS — M792 Neuralgia and neuritis, unspecified: Secondary | ICD-10-CM

## 2019-05-22 DIAGNOSIS — R936 Abnormal findings on diagnostic imaging of limbs: Secondary | ICD-10-CM | POA: Diagnosis not present

## 2019-05-22 DIAGNOSIS — M4316 Spondylolisthesis, lumbar region: Secondary | ICD-10-CM

## 2019-05-22 DIAGNOSIS — M79605 Pain in left leg: Secondary | ICD-10-CM

## 2019-05-22 DIAGNOSIS — M79604 Pain in right leg: Secondary | ICD-10-CM | POA: Diagnosis not present

## 2019-05-22 MED ORDER — HYDROCODONE-ACETAMINOPHEN 10-325 MG PO TABS: 1.0000 | ORAL_TABLET | Freq: Two times a day (BID) | ORAL | 0 refills | Status: DC | PRN

## 2019-05-22 MED ORDER — MELATONIN 10 MG PO CAPS: 10.0000 mg | ORAL_CAPSULE | Freq: Every day | ORAL | 2 refills | Status: DC

## 2019-05-22 MED ORDER — CYCLOBENZAPRINE HCL 10 MG PO TABS: 10.0000 mg | ORAL_TABLET | Freq: Every day | ORAL | 0 refills | Status: DC

## 2019-05-22 MED ORDER — GABAPENTIN 300 MG PO CAPS: 300.0000 mg | ORAL_CAPSULE | Freq: Four times a day (QID) | ORAL | 2 refills | Status: DC

## 2019-05-22 MED ORDER — MELOXICAM 15 MG PO TABS: 15.0000 mg | ORAL_TABLET | Freq: Every morning | ORAL | 0 refills | Status: DC

## 2019-05-22 NOTE — Patient Instructions (Signed)
____________________________________________________________________________________________  Medication Recommendations and Reminders  Applies to: All patients receiving prescriptions (written and/or electronic).  Medication Rules & Regulations: These rules and regulations exist for your safety and that of others. They are not flexible and neither are we. Dismissing or ignoring them will be considered "non-compliance" with medication therapy, resulting in complete and irreversible termination of such therapy. (See document titled "Medication Rules" for more details.) In all conscience, because of safety reasons, we cannot continue providing a therapy where the patient does not follow instructions.  Pharmacy of record:   Definition: This is the pharmacy where your electronic prescriptions will be sent.   We do not endorse any particular pharmacy.  You are not restricted in your choice of pharmacy.  The pharmacy listed in the electronic medical record should be the one where you want electronic prescriptions to be sent.  If you choose to change pharmacy, simply notify our nursing staff of your choice of new pharmacy.  Recommendations:  Keep all of your pain medications in a safe place, under lock and key, even if you live alone.   After you fill your prescription, take 1 week's worth of pills and put them away in a safe place. You should keep a separate, properly labeled bottle for this purpose. The remainder should be kept in the original bottle. Use this as your primary supply, until it runs out. Once it's gone, then you know that you have 1 week's worth of medicine, and it is time to come in for a prescription refill. If you do this correctly, it is unlikely that you will ever run out of medicine.  To make sure that the above recommendation works, it is very important that you make sure your medication refill appointments are scheduled at least 1 week before you run out of medicine. To do  this in an effective manner, make sure that you do not leave the office without scheduling your next medication management appointment. Always ask the nursing staff to show you in your prescription , when your medication will be running out. Then arrange for the receptionist to get you a return appointment, at least 7 days before you run out of medicine. Do not wait until you have 1 or 2 pills left, to come in. This is very poor planning and does not take into consideration that we may need to cancel appointments due to bad weather, sickness, or emergencies affecting our staff.  "Partial Fill": If for any reason your pharmacy does not have enough pills/tablets to completely fill or refill your prescription, do not allow for a "partial fill". You will need a separate prescription to fill the remaining amount, which we will not provide. If the reason for the partial fill is your insurance, you will need to talk to the pharmacist about payment alternatives for the remaining tablets, but again, do not accept a partial fill.  Prescription refills and/or changes in medication(s):   Prescription refills, and/or changes in dose or medication, will be conducted only during scheduled medication management appointments. (Applies to both, written and electronic prescriptions.)  No refills on procedure days. No medication will be changed or started on procedure days. No changes, adjustments, and/or refills will be conducted on a procedure day. Doing so will interfere with the diagnostic portion of the procedure.  No phone refills. No medications will be "called into the pharmacy".  No Fax refills.  No weekend refills.  No Holliday refills.  No after hours refills.  Remember:  Business   hours are:  Monday to Thursday 8:00 AM to 4:00 PM Provider's Schedule: Crystal King, NP - Appointments are:  Medication management: Monday to Thursday 8:00 AM to 4:00 PM Makaela Cando, MD - Appointments are:   Medication management: Monday and Wednesday 8:00 AM to 4:00 PM Procedure day: Tuesday and Thursday 7:30 AM to 4:00 PM Bilal Lateef, MD - Appointments are:  Medication management: Tuesday and Thursday 8:00 AM to 4:00 PM Procedure day: Monday and Wednesday 7:30 AM to 4:00 PM (Last update: 12/30/2017) ____________________________________________________________________________________________    

## 2019-05-31 ENCOUNTER — Ambulatory Visit: Payer: Medicare Other | Admitting: Pain Medicine

## 2019-09-01 ENCOUNTER — Other Ambulatory Visit
Admission: RE | Admit: 2019-09-01 | Discharge: 2019-09-01 | Disposition: A | Discharge status: Home / Self Care | Payer: No Typology Code available for payment source | Source: Ambulatory Visit | Attending: Pain Medicine | Admitting: Pain Medicine

## 2019-09-01 DIAGNOSIS — E539 Vitamin B deficiency, unspecified: Secondary | ICD-10-CM | POA: Diagnosis not present

## 2019-09-01 DIAGNOSIS — Z79899 Other long term (current) drug therapy: Secondary | ICD-10-CM | POA: Diagnosis not present

## 2019-09-01 DIAGNOSIS — M899 Disorder of bone, unspecified: Secondary | ICD-10-CM | POA: Diagnosis not present

## 2019-09-01 DIAGNOSIS — Z789 Other specified health status: Secondary | ICD-10-CM | POA: Diagnosis not present

## 2019-09-01 DIAGNOSIS — G894 Chronic pain syndrome: Secondary | ICD-10-CM | POA: Insufficient documentation

## 2019-09-01 LAB — MAGNESIUM: Magnesium: 1.9 mg/dL (ref 1.7–2.4)

## 2019-09-01 LAB — COMPREHENSIVE METABOLIC PANEL
ALT: 13 U/L (ref 0–44)
AST: 15 U/L (ref 15–41)
Albumin: 4.2 g/dL (ref 3.5–5.0)
Alkaline Phosphatase: 61 U/L (ref 38–126)
Anion gap: 9 (ref 5–15)
BUN: 13 mg/dL (ref 6–20)
CO2: 24 mmol/L (ref 22–32)
Calcium: 9.3 mg/dL (ref 8.9–10.3)
Chloride: 107 mmol/L (ref 98–111)
Creatinine, Ser: 0.44 mg/dL (ref 0.44–1.00)
GFR calc Af Amer: >60 mL/min (ref >60)
GFR calc non Af Amer: >60 mL/min (ref >60)
Glucose, Bld: 102 mg/dL — ABNORMAL HIGH (ref 70–99)
Potassium: 4 mmol/L (ref 3.5–5.1)
Sodium: 140 mmol/L (ref 135–145)
Total Bilirubin: 0.8 mg/dL (ref 0.3–1.2)
Total Protein: 7 g/dL (ref 6.5–8.1)

## 2019-09-01 LAB — VITAMIN D 25 HYDROXY (VIT D DEFICIENCY, FRACTURES): Vit D, 25-Hydroxy: 33.18 ng/mL (ref 30–100)

## 2019-09-01 LAB — VITAMIN B12: Vitamin B-12: 249 pg/mL (ref 180–914)

## 2019-09-01 LAB — SEDIMENTATION RATE: Sed Rate: 18 mm/hr (ref 0–30)

## 2019-09-01 LAB — C-REACTIVE PROTEIN: CRP: <0.8 mg/dL (ref <1.0)

## 2019-10-03 ENCOUNTER — Encounter: Payer: Self-pay | Admitting: Pain Medicine

## 2019-10-03 ENCOUNTER — Telehealth: Payer: Self-pay | Admitting: *Deleted

## 2019-10-03 NOTE — Progress Notes (Signed)
Pain Management Virtual Encounter Note - Virtual Visit via [place] (real-time audio visits between healthcare provider and patient).   Patient's Phone No. & Preferred Pharmacy:  [phone] (home); There is no such number on file (mobile).; (Preferred) [phone] [email or web address]  [id number] [address], [place] [address] Phone: [phone] Fax: [phone]    Pre-screening note:  Our staff contacted Stephanie [name] and offered her an "in person", "face-to-face" appointment versus a telephone encounter. She indicated preferring the telephone encounter, at this time.   Reason for Virtual Visit: COVID-19*  Social distancing based on CDC and AMA recommendations.   I contacted Stephanie [name] on 10/04/2019 via video conference.      I clearly identified myself as Gaspar Cola, MD. I verified that I was speaking with the correct person using two identifiers (Name: Stephanie [name], and date of birth: 04-01-1961).  Advanced Informed Consent I sought verbal advanced consent from Stephanie [name] for virtual visit interactions. I informed Stephanie [name] of possible security and privacy concerns, risks, and limitations associated with providing "not-in-person" medical evaluation and management services. I also informed [name] of the availability of "in-person" appointments. Finally, I informed her that there would be a charge for the virtual visit and that she could be  personally, fully or partially, financially responsible for it. [name] expressed understanding and agreed to proceed.   Historic Elements   Stephanie [name] is a 58 y.o. year old, female patient evaluated today after her last encounter by our practice on 10/03/2019. Stephanie [name]  has a past medical history of Allergy, Anemia, iron deficiency (04/09/2014), Arthritis, Atypical chest pain (04/09/2014), CD (contact dermatitis) (03/01/2014),  Chest pain (04/09/2014), and Lumbar radicular pain (08/22/2015). She also  has a past surgical history that includes Back surgery; Appendectomy; Cholecystectomy; Cesarean section; and Bunionectomy. Stephanie [name] has a current medication list which includes the following prescription(s): cyclobenzaprine, hydrocodone-acetaminophen, hydrocodone-acetaminophen, hydrocodone-acetaminophen, melatonin, meloxicam, gabapentin, and triamcinolone ointment. She  reports that she has never smoked. She has never used smokeless tobacco. She reports that she does not drink alcohol or use drugs. Stephanie [name] is allergic to phenergan [promethazine hcl].   HPI  Today, she is being contacted for medication management.  The patient indicates doing well with the current medication regimen. No adverse reactions or side effects reported to the medications.  According to the patient, she has been experiencing significant bilateral lower extremity pain with the right side being worse than the left.  She does have a prior history of an L5-S1 lumbar surgery.  X-rays of the lumbar spine on flexion and extension revealed an anterolisthesis apparently affecting the L4-5 and L5-S1 levels.  The patient describes the pain going down the right lower extremity as going to the top of the foot and what appears to be an L5 dermatomal distribution.  She indicates that when she sits down she does not have any pain in the legs but when she stands up for a period of time then she begins to experience that pain and it gets to the point where she is unable to ambulate.  She is also been experiencing burning sensations in both upper extremities with neck pain and she also indicates that it makes her head feel "funny".  Reviewing her chart I could not find any recent x-rays of the cervical spine.  Today I will go ahead and repeat her lumbar x-rays on flexion and extension and  will compare them to the 2017 x-rays.  We will also order some cervical spine x-rays.  I  have instructed the patient to give me a call as soon as she completes that so that we can go over them and decide what we need to do.  Pharmacotherapy Assessment  Analgesic: Hydrocodone/acetaminophen 10/325 mg, 1 tab PO BID (20 mg/day of hydrocodone)(has enough to last until 01/02/2020) MME/day:20mg /day.   Monitoring: Pharmacotherapy: No side-effects or adverse reactions reported. [place] PMP: PDMP reviewed during this encounter.       Compliance: No problems identified. Effectiveness: Clinically acceptable. Plan: Refer to "POC".  UDS:  Summary  Date Value Ref Range Status  12/28/2018 FINAL  Final    Comment:    ==================================================================== TOXASSURE SELECT 13 (MW) ==================================================================== Test                             Result       Flag       Units Drug Present   Hydrocodone                    1704                    ng/mg creat   Hydromorphone                  496                     ng/mg creat   Dihydrocodeine                 155                     ng/mg creat   Norhydrocodone                 >1992                   ng/mg creat    Sources of hydrocodone include scheduled prescription    medications. Hydromorphone, dihydrocodeine and norhydrocodone are    expected metabolites of hydrocodone. Hydromorphone and    dihydrocodeine are also available as scheduled prescription    medications. ==================================================================== Test                      Result    Flag   Units      Ref Range   Creatinine              251              mg/dL      >=20 ==================================================================== Declared Medications:  Medication list was not provided. ==================================================================== For clinical consultation, please call [phone]. ====================================================================     Laboratory Chemistry Profile (12 mo)  Renal: 09/01/2019: BUN 13; Creatinine, Ser 0.44  Lab Results  Component Value Date   GFRAA >60 09/01/2019   GFRNONAA >60 09/01/2019   Hepatic: 09/01/2019: Albumin 4.2 Lab Results  Component Value Date   AST 15 09/01/2019   ALT 13 09/01/2019   Other: 09/01/2019: CRP <0.8; Sed Rate 18; Vit D, 25-Hydroxy 33.18; Vitamin B-12 249 Note: Above Lab results reviewed.  Imaging  DG Knee 1-2 Views Right CLINICAL DATA:  Knee pain.  EXAM: RIGHT KNEE - 1-2 VIEW  COMPARISON:  MRI of the right knee 01/17/2014  FINDINGS: Generalized degenerative spurring with medial compartment narrowing. No evidence of fracture  or bone lesion. There is posterior translation of the tibial plateau relative to the distal femur. Negative for joint effusion.  IMPRESSION: 1. 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 2. Posterior translation of the tibial plateau relative to the distal femur, please correlate with drawer test.  Electronically Signed   By: Monte Fantasia M.D.   On: 12/21/2017 17:04 DG Knee 1-2 Views Left CLINICAL DATA:  Bilateral knee pain.  EXAM: LEFT KNEE - 1-2 VIEW  COMPARISON:  MRI of the left knee 01/07/2014  FINDINGS: Degenerative marginal spurring with moderate to advanced medial compartment narrowing. No fracture deformity or evidence of bone lesion. Negative for joint effusion.  IMPRESSION: Tricompartmental degenerative spurring with medial compartment narrowing.  Electronically Signed   By: Monte Fantasia M.D.   On: 12/21/2017 17:02   Assessment  The primary encounter diagnosis was Chronic pain syndrome. 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  Plan of Care  Problem-specific:  No problem-specific Assessment & Plan notes found for this encounter.  I have discontinued Barbaraann Rondo. Thacker's gabapentin. I am also having her start on HYDROcodone-acetaminophen, HYDROcodone-acetaminophen, and gabapentin. Additionally, I am having her maintain her HYDROcodone-acetaminophen, triamcinolone ointment, Melatonin, cyclobenzaprine, and meloxicam.  Pharmacotherapy (Medications Ordered): Meds ordered this encounter  Medications  . HYDROcodone-acetaminophen (NORCO) 10-325 MG tablet    Sig: Take 1 tablet by mouth every 12 (twelve) hours as needed for severe pain. Must last 30 days    Dispense:  45 tablet    Refill:  0    Chronic Pain: STOP Act (Not applicable) Fill 1 day early if closed on refill date. Do not fill until: 10/04/2019. To last until: 11/03/2019. Avoid benzodiazepines within 8 hours of opioids  . triamcinolone ointment (KENALOG) 0.1 %    Sig: Apply topically 3 (three) times daily.    Dispense:  80 g    Refill:  5    Fill one day early if pharmacy is closed on scheduled refill date. May substitute for generic if available.  . Melatonin ([facility] MELATONIN) 10 MG CAPS    Sig: Take 10-20 mg by mouth at bedtime.    Dispense:  90 capsule    Refill:  1    Fill one day early if pharmacy is closed on scheduled refill date. May substitute for generic if available.  . cyclobenzaprine (FLEXERIL) 10 MG tablet    Sig: Take 1 tablet (10 mg total) by mouth daily.    Dispense:   90 tablet    Refill:  1    Fill one day early if pharmacy is closed on scheduled refill date. May substitute for generic if available.  . meloxicam (MOBIC) 15 MG tablet    Sig: Take 1 tablet (15 mg total) by mouth every morning.    Dispense:  90 tablet    Refill:  1    Fill one day early if pharmacy is closed on scheduled refill date.  May substitute for generic if available.  Marland Kitchen HYDROcodone-acetaminophen (NORCO) 10-325 MG tablet    Sig: Take 1 tablet by mouth every 12 (twelve) hours as needed for severe pain. Must last 30 days    Dispense:  45 tablet    Refill:  0    Chronic Pain: STOP Act (Not applicable) Fill 1 day early if closed on refill date. Do not fill until: 11/03/2019. To last until: 12/03/2019. Avoid benzodiazepines within 8 hours of opioids  . HYDROcodone-acetaminophen (NORCO) 10-325 MG tablet    Sig: Take 1 tablet by mouth every 12 (twelve) hours as needed for severe pain. Must last 30 days    Dispense:  45 tablet    Refill:  0    Chronic Pain: STOP Act (Not applicable) Fill 1 day early if closed on refill date. Do not fill until: 12/03/2019. To last until: 01/02/2020. Avoid benzodiazepines within 8 hours of opioids  . gabapentin (NEURONTIN) 800 MG tablet    Sig: Take 1 tablet (800 mg total) by mouth 3 (three) times daily.    Dispense:  270 tablet    Refill:  1    Fill one day early if pharmacy is closed on scheduled refill date. May substitute for generic if available.   Orders:  Orders Placed This Encounter  Procedures  . DG Cervical Spine With Flex & Extend    In addition to any acute findings, please report on degenerative changes related to: (Please specify level(s)) (1) ROM & instability (>9mm displacement) (2) Facet joint (Zygoapophyseal Joint) (3) DDD and/or IVDD (4) Pars defects (5) Previous surgical changes (Include description of hardware and hardware status, if present) (6) Presence and degree of spondylolisthesis, spondylosis, and/or spondyloarthropathies)  (7)  Old Fractures (8) Demineralization (9) Additional bone pathology (10) Stenosis (Central, Lateral Recess, Foraminal) (11) If at all possible, please provide AP diameter (mm) of foraminal and/or central canal.    Standing Status:   Future    Standing Expiration Date:   01/02/2020    Scheduling Instructions:     Please evaluate for any evidence of cervical spine instability. Describe the presence of any spondylolisthesis (Antero- or retrolisthesis). If present, provide displacement "Grade" and measurement in cm. Please describe presence and specific location (Level & Laterality) of any signs of      osteoarthritis, zygapophyseal (Facet) joints DJD (including decreased joint space and/or osteophytosis), DDD, Foraminal narrowing, as well as any sclerosis and/or cyst formation. Please comment on ROM.    Order Specific Question:   Reason for Exam (SYMPTOM  OR DIAGNOSIS REQUIRED)    Answer:   Cervicalgia    Order Specific Question:   Is patient pregnant?    Answer:   No    Order Specific Question:   Preferred imaging location?    Answer:   [facility]    Order Specific Question:   Call Results- Best Contact Number?    Answer:   [phone] ([facility])    Order Specific Question:   Radiology Contrast Protocol - do NOT remove file path    Answer:   \\[email or web address]\[email or web address]\DXFluoroContrastProtocols.pdf  . DG Lumbar Spine Complete w/ Flex/Ext (6 Views)    In addition to any acute findings, please report on degenerative changes related to: (Please specify level(s)) (1) ROM & instability (>41mm displacement) (2) Facet joint (Zygoapophyseal Joint) (3) DDD and/or IVDD (4) Pars defects (5) Previous surgical changes (Include description of hardware and hardware status, if present) (6) Presence and degree of spondylolisthesis, spondylosis, and/or spondyloarthropathies)  (7)  Old Fractures (8) Demineralization (9) Additional bone pathology (10) Stenosis (Central, Lateral Recess,  Foraminal) (11) If at all possible, please provide AP diameter (mm) of foraminal and/or central canal.    Standing Status:   Future    Standing Expiration Date:   01/02/2020    Order Specific Question:   Reason for Exam (SYMPTOM  OR DIAGNOSIS REQUIRED)    Answer:   Low back pain    Order Specific Question:   Is patient pregnant?    Answer:   No    Order Specific Question:   Preferred imaging location?    Answer:   [facility]    Order Specific Question:   Call Results- Best Contact Number?    Answer:   [phone] ([facility])    Order Specific Question:   Radiology Contrast Protocol - do NOT remove file path    Answer:   \\[email or web address]\DXFluoroContrastProtocols.pdf   Follow-up plan:   Return in about 3 months (around 01/01/2020) for (VV), (MM).      Interventional therapies: Planned, scheduled, and/or pending:   Patient seen at [facility] in [place] for right knee pathology.   Considering:   Diagnostic bilateral lumbar facet block Possible bilateral lumbar facet RFA Diagnostic Caudal ESI Diagnostic Bilateral L4-5 & L5-S1Lumbar TFESI Diagnostic bilateral sacroiliac joint block Possible bilateral sacroiliac joint RFA Diagnostic bilateral hip joint injection Possible bilateral hip joint RFA Diagnostic bilateral knee joint injection Possible series of Hyalgan knee injections Diagnostic bilateral genicular nerve block Possible bilateral genicular nerve RFA.   Palliative PRN treatment(s):   Palliative Caudal ESI     Recent Visits No visits were found meeting these conditions.  Showing recent visits within past 90 days and meeting all other requirements   Future Appointments Date Type Provider Dept  01/01/20 Appointment Milinda Pointer, MD [facility]  Showing future appointments within next 90 days and meeting all other requirements   I discussed the assessment and treatment plan with the patient. The patient was provided an  opportunity to ask questions and all were answered. The patient agreed with the plan and demonstrated an understanding of the instructions.  Patient advised to call back or seek an in-person evaluation if the symptoms or condition worsens.  Total duration of non-face-to-face encounter: 15 minutes.  Note by: Gaspar Cola, MD Date: 10/04/2019; Time: 9:35 AM  Note: This dictation was prepared with Dragon dictation. Any transcriptional errors that may result from this process are unintentional.  Disclaimer:  * Given the special circumstances of the COVID-19 pandemic, the federal government has announced that the Office for Civil Rights (OCR) will exercise its enforcement discretion and will not impose penalties on physicians using telehealth in the event of noncompliance with regulatory requirements under the [place] (HIPAA) in connection with the good faith provision of telehealth during the XX123456 national public health emergency. ([place])

## 2019-10-03 NOTE — Telephone Encounter (Signed)
Attempted to call for pre appointment review of allergies/meds. Message left. 

## 2019-10-04 ENCOUNTER — Ambulatory Visit: Payer: No Typology Code available for payment source | Attending: Pain Medicine | Admitting: Pain Medicine

## 2019-10-04 ENCOUNTER — Other Ambulatory Visit: Payer: Self-pay

## 2019-10-04 DIAGNOSIS — L308 Other specified dermatitis: Secondary | ICD-10-CM

## 2019-10-04 DIAGNOSIS — G8929 Other chronic pain: Secondary | ICD-10-CM

## 2019-10-04 DIAGNOSIS — M5442 Lumbago with sciatica, left side: Secondary | ICD-10-CM

## 2019-10-04 DIAGNOSIS — G894 Chronic pain syndrome: Secondary | ICD-10-CM | POA: Diagnosis not present

## 2019-10-04 DIAGNOSIS — M4310 Spondylolisthesis, site unspecified: Secondary | ICD-10-CM

## 2019-10-04 DIAGNOSIS — M79605 Pain in left leg: Secondary | ICD-10-CM

## 2019-10-04 DIAGNOSIS — G4701 Insomnia due to medical condition: Secondary | ICD-10-CM

## 2019-10-04 DIAGNOSIS — M48061 Spinal stenosis, lumbar region without neurogenic claudication: Secondary | ICD-10-CM

## 2019-10-04 DIAGNOSIS — M961 Postlaminectomy syndrome, not elsewhere classified: Secondary | ICD-10-CM

## 2019-10-04 DIAGNOSIS — M4316 Spondylolisthesis, lumbar region: Secondary | ICD-10-CM

## 2019-10-04 DIAGNOSIS — M79622 Pain in left upper arm: Secondary | ICD-10-CM

## 2019-10-04 DIAGNOSIS — M79604 Pain in right leg: Secondary | ICD-10-CM | POA: Diagnosis not present

## 2019-10-04 DIAGNOSIS — M542 Cervicalgia: Secondary | ICD-10-CM | POA: Insufficient documentation

## 2019-10-04 DIAGNOSIS — M25562 Pain in left knee: Secondary | ICD-10-CM

## 2019-10-04 DIAGNOSIS — M62830 Muscle spasm of back: Secondary | ICD-10-CM

## 2019-10-04 DIAGNOSIS — M7918 Myalgia, other site: Secondary | ICD-10-CM

## 2019-10-04 DIAGNOSIS — M79621 Pain in right upper arm: Secondary | ICD-10-CM | POA: Insufficient documentation

## 2019-10-04 DIAGNOSIS — M48062 Spinal stenosis, lumbar region with neurogenic claudication: Secondary | ICD-10-CM

## 2019-10-04 DIAGNOSIS — M792 Neuralgia and neuritis, unspecified: Secondary | ICD-10-CM

## 2019-10-04 DIAGNOSIS — M5417 Radiculopathy, lumbosacral region: Secondary | ICD-10-CM | POA: Insufficient documentation

## 2019-10-04 DIAGNOSIS — M5412 Radiculopathy, cervical region: Secondary | ICD-10-CM | POA: Insufficient documentation

## 2019-10-04 DIAGNOSIS — L309 Dermatitis, unspecified: Secondary | ICD-10-CM

## 2019-10-04 DIAGNOSIS — M25561 Pain in right knee: Secondary | ICD-10-CM | POA: Diagnosis not present

## 2019-10-04 DIAGNOSIS — M47816 Spondylosis without myelopathy or radiculopathy, lumbar region: Secondary | ICD-10-CM

## 2019-10-04 DIAGNOSIS — M5441 Lumbago with sciatica, right side: Secondary | ICD-10-CM

## 2019-10-04 MED ORDER — TRIAMCINOLONE ACETONIDE 0.1 % EX OINT: TOPICAL_OINTMENT | Freq: Three times a day (TID) | CUTANEOUS | 5 refills | Status: DC

## 2019-10-04 MED ORDER — CYCLOBENZAPRINE HCL 10 MG PO TABS: 10.0000 mg | ORAL_TABLET | Freq: Every day | ORAL | 1 refills | Status: DC

## 2019-10-04 MED ORDER — MELOXICAM 15 MG PO TABS: 15.0000 mg | ORAL_TABLET | Freq: Every morning | ORAL | 1 refills | Status: DC

## 2019-10-04 MED ORDER — GABAPENTIN 800 MG PO TABS: 800.0000 mg | ORAL_TABLET | Freq: Three times a day (TID) | ORAL | 1 refills | Status: DC

## 2019-10-04 MED ORDER — HYDROCODONE-ACETAMINOPHEN 10-325 MG PO TABS: 1.0000 | ORAL_TABLET | Freq: Two times a day (BID) | ORAL | 0 refills | Status: DC | PRN

## 2019-10-04 MED ORDER — MELATONIN 10 MG PO CAPS: 10.0000 mg | ORAL_CAPSULE | Freq: Every day | ORAL | 1 refills | Status: DC

## 2019-10-06 ENCOUNTER — Ambulatory Visit
Admission: RE | Admit: 2019-10-06 | Discharge: 2019-10-06 | Disposition: A | Discharge status: Home / Self Care | Payer: No Typology Code available for payment source | Source: Ambulatory Visit | Attending: Pain Medicine | Admitting: Pain Medicine

## 2019-10-06 ENCOUNTER — Other Ambulatory Visit: Payer: Self-pay

## 2019-10-06 DIAGNOSIS — M79605 Pain in left leg: Secondary | ICD-10-CM

## 2019-10-06 DIAGNOSIS — G8929 Other chronic pain: Secondary | ICD-10-CM | POA: Diagnosis present

## 2019-10-06 DIAGNOSIS — M4316 Spondylolisthesis, lumbar region: Secondary | ICD-10-CM | POA: Diagnosis present

## 2019-10-06 DIAGNOSIS — M47816 Spondylosis without myelopathy or radiculopathy, lumbar region: Secondary | ICD-10-CM | POA: Insufficient documentation

## 2019-10-06 DIAGNOSIS — M5441 Lumbago with sciatica, right side: Secondary | ICD-10-CM | POA: Diagnosis present

## 2019-10-06 DIAGNOSIS — M79621 Pain in right upper arm: Secondary | ICD-10-CM | POA: Insufficient documentation

## 2019-10-06 DIAGNOSIS — M5417 Radiculopathy, lumbosacral region: Secondary | ICD-10-CM | POA: Insufficient documentation

## 2019-10-06 DIAGNOSIS — M961 Postlaminectomy syndrome, not elsewhere classified: Secondary | ICD-10-CM | POA: Insufficient documentation

## 2019-10-06 DIAGNOSIS — M4310 Spondylolisthesis, site unspecified: Secondary | ICD-10-CM | POA: Diagnosis present

## 2019-10-06 DIAGNOSIS — M48061 Spinal stenosis, lumbar region without neurogenic claudication: Secondary | ICD-10-CM | POA: Insufficient documentation

## 2019-10-06 DIAGNOSIS — M5412 Radiculopathy, cervical region: Secondary | ICD-10-CM | POA: Insufficient documentation

## 2019-10-06 DIAGNOSIS — M542 Cervicalgia: Secondary | ICD-10-CM | POA: Insufficient documentation

## 2019-10-06 DIAGNOSIS — M79622 Pain in left upper arm: Secondary | ICD-10-CM | POA: Diagnosis present

## 2019-10-06 DIAGNOSIS — M79604 Pain in right leg: Secondary | ICD-10-CM | POA: Diagnosis present

## 2019-10-06 DIAGNOSIS — M5442 Lumbago with sciatica, left side: Secondary | ICD-10-CM | POA: Insufficient documentation

## 2019-10-06 DIAGNOSIS — M48062 Spinal stenosis, lumbar region with neurogenic claudication: Secondary | ICD-10-CM

## 2019-10-24 ENCOUNTER — Telehealth: Payer: Self-pay

## 2019-10-24 NOTE — Telephone Encounter (Signed)
What are your thoughts for this patient. See latest telephone message.

## 2019-10-24 NOTE — Telephone Encounter (Signed)
Pt called to see if Dr Dossie Arbour has her X-Ray results. Pt having a lot of lower back pain radiating down her Right leg to knee. Some pain in left leg but worse on Right. Pt taking 10mg  Hydrocodone Q12hrs. The 1 tab helps but not the 1/2 tab not so much, taking Gabapentin in between for pain.Pt cant sleep,she's using heating pad. Pt needs help with managing pain, med's or shot, Whatever Dr suggests for help with pain.  Please call pt back. AT

## 2019-10-25 ENCOUNTER — Other Ambulatory Visit: Payer: Self-pay | Admitting: Pain Medicine

## 2019-10-25 DIAGNOSIS — G8929 Other chronic pain: Secondary | ICD-10-CM

## 2019-10-25 DIAGNOSIS — M545 Low back pain, unspecified: Secondary | ICD-10-CM | POA: Insufficient documentation

## 2019-10-25 DIAGNOSIS — M4696 Unspecified inflammatory spondylopathy, lumbar region: Secondary | ICD-10-CM | POA: Insufficient documentation

## 2019-10-25 MED ORDER — PREDNISONE 20 MG PO TABS: ORAL_TABLET | ORAL | 0 refills | Status: AC

## 2019-10-25 NOTE — Progress Notes (Signed)
Test: Blood sugar (Glucose levels) Finding(s): High (hyperglycemia) Normal Level(s): Normal fasting (NPO x 8 hours) glucose levels are between 65-99 mg/dl, with 2 hour fasting, levels are usually less than 140 mg/dl. Clinical significance: Any random blood glucose level greater than 200 mg/dl is considered to be Diabetes. Signs and symptoms may include: (when persistently above 180 mg/dL) Increased thirst; headaches; trouble concentrating; blurred vision; frequent peeing (urination); fatigue (weakness and tired feeling); weight loss. The most common and classical symptoms of an undiagnosed diabetes with hyperglycemia are: Increased urinary frequency (polyuria), thirst (polydipsia), hunger (polyphagia), and unexplained weight loss; numbness in the extremities, pain in feet (dysesthesias), fatigue, and blurred vision; recurrent or severe infections; loss of consciousness or severe nausea/vomiting (ketoacidosis) or coma. Patient Recommendation(s): Fasting levels above 140 mg/dL or any levels above 200 mg/dL should follow-up with PCP (Primary Care Physician) for further evaluation. ___________________________________________________________________________________  

## 2019-10-25 NOTE — Telephone Encounter (Signed)
Called . Left message  that a prescription was order that she would need to call back for an appt in 2 weeks

## 2019-10-25 NOTE — Telephone Encounter (Signed)
Stephanie [name],  Ok, lets stop the "What are your thoughts" messages. Always call the patient and get a detailed report of the pain. What hurts more? The LBP or the LEP? Laterality? Whis side hurts more? Things that make it better? Things that makes it worse? Associated symptoms? Urinary incontinence? Weakness, numbness? Where? And most important.Marland KitchenMarland KitchenHow far down the leg does the pain go? Give details for each leg.  I already sent a script to the pharmacy for a steroid pack. Call her and let her know. Schedule her for a VV-F/U in 2 weeks. If pain does not improve with oral steroids, have her call us to change the appointment for a procedure.

## 2019-11-11 ENCOUNTER — Other Ambulatory Visit: Payer: Self-pay | Admitting: Pain Medicine

## 2019-11-11 DIAGNOSIS — G4701 Insomnia due to medical condition: Secondary | ICD-10-CM

## 2019-11-14 ENCOUNTER — Telehealth: Payer: Self-pay | Admitting: Pain Medicine

## 2019-11-14 NOTE — Telephone Encounter (Signed)
Patient called to let Dr. Dossie Arbour know she is going to set up appt to speak with him. She just needs a little more time, due to her husband just passed away on 11-20-2019, they had only recently married. She is still very distraught but wants you to know she will set up her appt to talk with you.

## 2019-11-22 ENCOUNTER — Encounter: Payer: Self-pay | Admitting: Pain Medicine

## 2019-11-23 NOTE — Progress Notes (Signed)
Patient: Stephanie [name]  Service Category: E/M  Provider: Gaspar Cola, MD  DOB: 08-25-1961  DOS: 11/27/2019  Location: Office  MRN: [id number]  Setting: Ambulatory outpatient  Referring Provider: [facility]  Type: Established Patient  Specialty: Interventional Pain Management  PCP: [facility]  Location: Remote location  Delivery: TeleHealth     Virtual Encounter - Pain Management PROVIDER NOTE: Information contained herein reflects review and annotations entered in association with encounter. Interpretation of such information and data should be left to medically-trained personnel. Information provided to patient can be located elsewhere in the medical record under "Patient Instructions". Document created using STT-dictation technology, any transcriptional errors that may result from process are unintentional.    Contact & Pharmacy Preferred: [phone] Home: [phone] (home) Mobile: There is no such number on file (mobile). E-mail: [email or web address]  [id number] [address], [place] [address] Phone: [phone] Fax: [phone]   Pre-screening  Stephanie [name] offered "in-person" vs "virtual" encounter. She indicated preferring virtual for this encounter.   Reason COVID-19*  Social distancing based on CDC and AMA recommendations.   I contacted Stephanie [name] on 11/27/2019 via telephone.      I clearly identified myself as Gaspar Cola, MD. I verified that I was speaking with the correct person using two identifiers (Name: Stephanie [name], and date of birth: [birth year]).  Consent I sought verbal advanced consent from Stephanie [name] for virtual visit interactions. I informed Stephanie [name] of possible security and privacy concerns, risks, and limitations associated with providing "not-in-person" medical evaluation and management services. I also informed Stephanie [name] of the  availability of "in-person" appointments. Finally, I informed her that there would be a charge for the virtual visit and that she could be  personally, fully or partially, financially responsible for it. Stephanie [name] expressed understanding and agreed to proceed.   Historic Elements   Stephanie [name] is a 59 y.o. year old, female patient evaluated today after her last encounter by our practice on 11/14/2019. Stephanie [name]  has a past medical history of Allergy, Anemia, iron deficiency (04/09/2014), Arthritis, Atypical chest pain (04/09/2014), CD (contact dermatitis) (03/01/2014), Chest pain (04/09/2014), and Lumbar radicular pain (08/22/2015). She also  has a past surgical history that includes Back surgery; Appendectomy; Cholecystectomy; Cesarean section; and Bunionectomy. Stephanie [name] has a current medication list which includes the following prescription(s): cyclobenzaprine, gabapentin, [START ON 12/03/2019] hydrocodone-acetaminophen, [START ON 01/02/2020] hydrocodone-acetaminophen, [START ON 02/01/2020] hydrocodone-acetaminophen, melatonin, meloxicam, and triamcinolone ointment. She  reports that she has never smoked. She has never used smokeless tobacco. She reports that she does not drink alcohol or use drugs. Stephanie [name] is allergic to phenergan [promethazine hcl].   HPI  Today, she is being contacted for follow-up evaluation.  Around December the patient was having some symptoms of pain going down the right lower extremity that seem to be radicular in nature.  At the time we went ahead and gave the patient a Medrol Dosepak and I asked her to give [place] a call to follow-up and see if that helped.  To make a long story short, the patient indicated that she did not call back because she was not in her "wright state of mind".  Apparently, she got married, her husband got sick, and died, all within a very short period time.  In any case, the patient continues to have pain that is going all the way down  to the top  of her foot and to the big toe and the web between the big toe and the next one, following an L5 dermatomal distribution on the right side.  Although the patient is able to pull her foot and toe towards her, she indicates that this hurts.  The pain is worse when she stands up and attempts to walk.  In reviewing her medical records, the last time that this patient had an MRI of the lumbar spine was on 01/23/2014.  She indicates that things have progressed and gotten worse since then.  At this point I will go ahead and put an order to have that MRI repeated.  In addition, since she failed the oral steroids, I will put her on the schedule to come in and have a right-sided L5 transforaminal epidural steroid injection and a right-sided L4-5 LESI under fluoroscopic guidance and IV sedation.  The plan was shared with the patient who understood and agreed.  Pharmacotherapy Assessment  Analgesic: Hydrocodone/acetaminophen 10/325 mg, 1 tab PO BID (20 mg/day of hydrocodone)(has enough to last until 02/28/2020) MME/day:20mg /day.    Monitoring: Pharmacotherapy: No side-effects or adverse reactions reported. [place] PMP: PDMP reviewed during this encounter.       Compliance: No problems identified. Effectiveness: Clinically acceptable. Plan: Refer to "POC".  UDS:  Summary  Date Value Ref Range Status  12/28/2018 FINAL  Final    Comment:    ==================================================================== TOXASSURE SELECT 13 (MW) ==================================================================== Test                             Result       Flag       Units Drug Present   Hydrocodone                    1704                    ng/mg creat   Hydromorphone                  496                     ng/mg creat   Dihydrocodeine                 155                     ng/mg creat   Norhydrocodone                 >1992                   ng/mg creat    Sources of hydrocodone include scheduled prescription     medications. Hydromorphone, dihydrocodeine and norhydrocodone are    expected metabolites of hydrocodone. Hydromorphone and    dihydrocodeine are also available as scheduled prescription    medications. ==================================================================== Test                      Result    Flag   Units      Ref Range   Creatinine              251              mg/dL      >=20 ==================================================================== Declared Medications:  Medication list was not provided. ==================================================================== For clinical consultation, please call [phone]. ====================================================================  Laboratory Chemistry Profile (12 mo)  Renal: 09/01/2019: BUN 13; Creatinine, Ser 0.44  Lab Results  Component Value Date   GFRAA >60 09/01/2019   GFRNONAA >60 09/01/2019   Hepatic: 09/01/2019: Albumin 4.2 Lab Results  Component Value Date   AST 15 09/01/2019   ALT 13 09/01/2019   Other: 09/01/2019: CRP <0.8; Sed Rate 18; Vit D, 25-Hydroxy 33.18; Vitamin B-12 249  Note: Above Lab results reviewed.  Imaging  DG Lumbar Spine Complete w/ Flex/Ext (6 Views) CLINICAL DATA:  Chronic low back pain radiating into the right leg. Symptoms have worsened over the past 3 weeks. No known injury. History of prior lumbar surgery.  EXAM: LUMBAR SPINE - COMPLETE WITH BENDING VIEWS  COMPARISON:  Plain films lumbar spine 04/15/2016.  FINDINGS: Vertebral body height is maintained. 0.5 cm anterolisthesis L4 on L5 is unchanged. There is approximately 1.4 cm anterolisthesis L5 on S1. The patient is status post L5-S1 fusion. Hardware is intact. Mild lucency about the left S1 screw is unchanged. Mild loss of disc space height at L4-5 is also unchanged. No pathologic motion is seen with flexion or extension. Paraspinous structures are unremarkable.  IMPRESSION: No new abnormality compared  to the prior examination.  Degenerative disease most notable at L4-5 where there is some loss of disc space height and 0.5 cm anterolisthesis due to facet degenerative disease.  Status post L5-S1 fusion.  Electronically Signed   By: Inge Rise M.D.   On: 10/06/2019 14:12 DG Cervical Spine With Flex & Extend CLINICAL DATA:  Neck pain.  No known injury.  EXAM: CERVICAL SPINE COMPLETE WITH FLEXION AND EXTENSION VIEWS  COMPARISON:  None.  FINDINGS: Vertebral body height and alignment are maintained. There is mild loss of disc space height and endplate spurring at 075-GRM. No pathologic motion is seen with flexion or extension. Neural foramina appear open. Prevertebral soft tissues are normal. Lung apices are clear.  IMPRESSION: Mild appearing degenerative disc disease C5-6. The exam is otherwise negative.  Electronically Signed   By: Inge Rise M.D.   On: 10/06/2019 14:04   Assessment  The primary encounter diagnosis was Acute exacerbation of chronic low back pain. 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  Plan of Care  Problem-specific:  No problem-specific Assessment & Plan notes found for this encounter.  I am having Stephanie [name] start on HYDROcodone-acetaminophen. I am also having her maintain her triamcinolone ointment, Melatonin, cyclobenzaprine, meloxicam, HYDROcodone-acetaminophen, gabapentin, and HYDROcodone-acetaminophen.  Pharmacotherapy (Medications Ordered): Meds ordered this encounter  Medications  . HYDROcodone-acetaminophen (NORCO) 10-325 MG tablet    Sig: Take 1 tablet by mouth every 12 (twelve) hours as needed  for severe pain. Must last 30 days    Dispense:  45 tablet    Refill:  0    Chronic Pain: STOP Act (Not applicable) Fill 1 day early if closed on refill date. Do not fill until: 01/02/2020. To last until: 02/01/2020. Avoid benzodiazepines within 8 hours of opioids  . HYDROcodone-acetaminophen (NORCO) 10-325 MG tablet    Sig: Take 1 tablet by mouth every 12 (twelve) hours as needed for severe pain. Must last 30 days    Dispense:  45 tablet    Refill:  0    Chronic Pain: STOP  Act (Not applicable) Fill 1 day early if closed on refill date. Do not fill until: 02/01/2020. To last until: 03/02/2020. Avoid benzodiazepines within 8 hours of opioids   Orders:  Orders Placed This Encounter  Procedures  . Lumbar Epidural Injection    Standing Status:   Future    Standing Expiration Date:   12/28/2019    Scheduling Instructions:     Procedure: Interlaminar Lumbar Epidural Steroid injection (LESI)  L4-5     Laterality: Right-sided     Sedation: With Sedation.     Timeframe: ASAP    Order Specific Question:   Where will this procedure be performed?    Answer:   [facility]  . Lumbar Transforaminal Epidural    Standing Status:   Future    Standing Expiration Date:   12/28/2019    Scheduling Instructions:     Side: Right-sided     Level: L5     Sedation: With Sedation.     Timeframe: ASAP    Order Specific Question:   Where will this procedure be performed?    Answer:   [facility]   Follow-up plan:   Return in about 3 months (around 02/28/2020) for (VV), (MM), in addition, Procedure (w/ sedation): (R) L5 TFESI #1 + (R) L4-5 LESI #1, (ASAP).      Interventional therapies: Planned, scheduled, and/or pending:   Patient seen at [facility] in [place] for right knee pathology.   Considering:   Diagnostic bilateral lumbar facet block Possible bilateral lumbar facet RFA Diagnostic Caudal ESI Diagnostic Bilateral L4-5 & L5-S1Lumbar TFESI Diagnostic bilateral sacroiliac joint  block Possible bilateral sacroiliac joint RFA Diagnostic bilateral hip joint injection Possible bilateral hip joint RFA Diagnostic bilateral knee joint injection Possible series of Hyalgan knee injections Diagnostic bilateral genicular nerve block Possible bilateral genicular nerve RFA.   Palliative PRN treatment(s):   Palliative Caudal ESI      Recent Visits No visits were found meeting these conditions.  Showing recent visits within past 90 days and meeting all other requirements   Today's Visits Date Type Provider Dept  11/27/19 Telemedicine Milinda Pointer, MD [facility]  Showing today's visits and meeting all other requirements   Future Appointments Date Type Provider Dept  01/01/20 Appointment Milinda Pointer, MD [facility]  Showing future appointments within next 90 days and meeting all other requirements   I discussed the assessment and treatment plan with the patient. The patient was provided an opportunity to ask questions and all were answered. The patient agreed with the plan and demonstrated an understanding of the instructions.  Patient advised to call back or seek an in-person evaluation if the symptoms or condition worsens.  Duration of encounter: 22 minutes.  Note by: Gaspar Cola, MD Date: 11/27/2019; Time: 2:59 PM

## 2019-11-27 ENCOUNTER — Other Ambulatory Visit: Payer: Self-pay

## 2019-11-27 ENCOUNTER — Ambulatory Visit: Payer: No Typology Code available for payment source | Attending: Pain Medicine | Admitting: Pain Medicine

## 2019-11-27 DIAGNOSIS — M79604 Pain in right leg: Secondary | ICD-10-CM | POA: Diagnosis not present

## 2019-11-27 DIAGNOSIS — M5417 Radiculopathy, lumbosacral region: Secondary | ICD-10-CM

## 2019-11-27 DIAGNOSIS — M48062 Spinal stenosis, lumbar region with neurogenic claudication: Secondary | ICD-10-CM

## 2019-11-27 DIAGNOSIS — M961 Postlaminectomy syndrome, not elsewhere classified: Secondary | ICD-10-CM

## 2019-11-27 DIAGNOSIS — G96198 Other disorders of meninges, not elsewhere classified: Secondary | ICD-10-CM

## 2019-11-27 DIAGNOSIS — M5137 Other intervertebral disc degeneration, lumbosacral region: Secondary | ICD-10-CM

## 2019-11-27 DIAGNOSIS — G8929 Other chronic pain: Secondary | ICD-10-CM

## 2019-11-27 DIAGNOSIS — M79605 Pain in left leg: Secondary | ICD-10-CM

## 2019-11-27 DIAGNOSIS — M545 Low back pain: Secondary | ICD-10-CM | POA: Diagnosis not present

## 2019-11-27 DIAGNOSIS — G894 Chronic pain syndrome: Secondary | ICD-10-CM

## 2019-11-27 DIAGNOSIS — M48061 Spinal stenosis, lumbar region without neurogenic claudication: Secondary | ICD-10-CM

## 2019-11-27 MED ORDER — HYDROCODONE-ACETAMINOPHEN 10-325 MG PO TABS: 1.0000 | ORAL_TABLET | Freq: Two times a day (BID) | ORAL | 0 refills | Status: DC | PRN

## 2019-12-12 ENCOUNTER — Ambulatory Visit: Payer: No Typology Code available for payment source | Admitting: Pain Medicine

## 2020-01-01 ENCOUNTER — Ambulatory Visit: Payer: No Typology Code available for payment source | Admitting: Pain Medicine

## 2020-02-23 NOTE — Progress Notes (Signed)
Patient: Stephanie [name]  Service Category: E/M  Provider: Gaspar Cola, MD  DOB: [birth year]  DOS: 02/26/2020  Location: Office  MRN: [id number]  Setting: Ambulatory outpatient  Referring Provider: [facility]  Type: Established Patient  Specialty: Interventional Pain Management  PCP: [facility]  Location: Remote location  Delivery: TeleHealth     Virtual Encounter - Pain Management PROVIDER NOTE: Information contained herein reflects review and annotations entered in association with encounter. Interpretation of such information and data should be left to medically-trained personnel. Information provided to patient can be located elsewhere in the medical record under "Patient Instructions". Document created using STT-dictation technology, any transcriptional errors that may result from process are unintentional.    Contact & Pharmacy Preferred: [phone] Home: [phone] (home) Mobile: There is no such number on file (mobile). E-mail: [email or web address]  [id number] [address]Phone: [phone]: [phone]  Pre-screening  Ms. SNicole Kindredoffered "in-person" vs "virtual" encounter. She indicated preferring virtual for this encounter.   Reason COVID-19*  Social distancing based on CDC and AMA recommendations.   I contacted LSumner Boaston 02/26/2020 via telephone.      I clearly identified myself as FGaspar Cola MD. I verified that I was speaking with the correct person using two identifiers (Name: Stephanie [name] and date of birth: 812/14/1962.  Consent I sought verbal advanced consent from [name] virtual visit interactions. I informed Ms. SLundof possible security and privacy concerns, risks, and limitations associated with providing "not-in-person" medical evaluation and management services. I also informed Ms. SLindermanof the  availability of "in-person" appointments. Finally, I informed her that there would be a charge for the virtual visit and that she could be  personally, fully or partially, financially responsible for it. Ms. SVannatterexpressed understanding and agreed to proceed.   Historic Elements   Stephanie [name] a 89y.o. year old, female patient evaluated today after her last contact with our practice on 11/14/2019. Stephanie [name] has a past medical history of Allergy, Anemia, iron deficiency (04/09/2014), Arthritis, Atypical chest pain (04/09/2014), CD (contact dermatitis) (03/01/2014), Chest pain (04/09/2014), and Lumbar radicular pain (08/22/2015). She also  has a past surgical history that includes Back surgery; Appendectomy; Cholecystectomy; Cesarean section; and Bunionectomy. Ms. SSegallhas a current medication list which includes the following prescription(s): [START ON 04/01/2020] cyclobenzaprine, [START ON 04/01/2020] gabapentin, [START ON 03/02/2020] hydrocodone-acetaminophen, [START ON 04/01/2020] hydrocodone-acetaminophen, [START ON 05/01/2020] hydrocodone-acetaminophen, [START ON 04/01/2020] melatonin, [START ON 04/01/2020] meloxicam, and triamcinolone ointment. She  reports that she has never smoked. She has never used smokeless tobacco. She reports that she does not drink alcohol or use drugs. Stephanie [name] allergic to phenergan [promethazine hcl].   HPI  Today, she is being contacted for medication management.  The patient indicates doing well with the current medication regimen. No adverse reactions or side effects reported to the medications.   Pharmacotherapy Assessment  Analgesic: Hydrocodone/acetaminophen 10/325 mg, 1 tab PO BID (20 mg/day of hydrocodone)(has enough to last until 02/28/2020) MME/day:254mday.   Monitoring: [place] PMP: PDMP reviewed during this encounter.       Pharmacotherapy: No side-effects or adverse reactions reported. Compliance: No problems identified. Effectiveness:  Clinically acceptable. Plan: Refer to "POC".  UDS:  Summary  Date Value Ref Range Status  12/28/2018 FINAL  Final    Comment:    ==================================================================== TOXASSURE  SELECT 13 (MW) ==================================================================== Test                             Result       Flag       Units Drug Present   Hydrocodone                    1704                    ng/mg creat   Hydromorphone                  496                     ng/mg creat   Dihydrocodeine                 155                     ng/mg creat   Norhydrocodone                 >1992                   ng/mg creat    Sources of hydrocodone include scheduled prescription    medications. Hydromorphone, dihydrocodeine and norhydrocodone are    expected metabolites of hydrocodone. Hydromorphone and    dihydrocodeine are also available as scheduled prescription    medications. ==================================================================== Test                      Result    Flag   Units      Ref Range   Creatinine              251              mg/dL      >=20 ==================================================================== Declared Medications:  Medication list was not provided. ==================================================================== For clinical consultation, please call [phone]. ====================================================================    Laboratory Chemistry Profile   Renal Lab Results  Component Value Date   BUN 13 09/01/2019   CREATININE 0.44 09/01/2019   GFRAA >60 09/01/2019   GFRNONAA >60 09/01/2019     Hepatic Lab Results  Component Value Date   AST 15 09/01/2019   ALT 13 09/01/2019   ALBUMIN 4.2 09/01/2019   ALKPHOS 61 09/01/2019     Electrolytes Lab Results  Component Value Date   NA 140 09/01/2019   K 4.0 09/01/2019   CL 107 09/01/2019   CALCIUM 9.3 09/01/2019   MG 1.9 09/01/2019      Bone Lab Results  Component Value Date   VD25OH 33.18 09/01/2019   25OHVITD1 34 05/07/2016   25OHVITD2 <1.0 05/07/2016   25OHVITD3 34 05/07/2016     Inflammation (CRP: Acute Phase) (ESR: Chronic Phase) Lab Results  Component Value Date   CRP <0.8 09/01/2019   ESRSEDRATE 18 09/01/2019       Note: Above Lab results reviewed.  Imaging  DG Lumbar Spine Complete w/ Flex/Ext (6 Views) CLINICAL DATA:  Chronic low back pain radiating into the right leg. Symptoms have worsened over the past 3 weeks. No known injury. History of prior lumbar surgery.  EXAM: LUMBAR SPINE - COMPLETE WITH BENDING VIEWS  COMPARISON:  Plain films lumbar spine 04/15/2016.  FINDINGS: Vertebral body height is maintained. 0.5 cm anterolisthesis L4 on L5 is unchanged. There is approximately 1.4  cm anterolisthesis L5 on S1. The patient is status post L5-S1 fusion. Hardware is intact. Mild lucency about the left S1 screw is unchanged. Mild loss of disc space height at L4-5 is also unchanged. No pathologic motion is seen with flexion or extension. Paraspinous structures are unremarkable.  IMPRESSION: No new abnormality compared to the prior examination.  Degenerative disease most notable at L4-5 where there is some loss of disc space height and 0.5 cm anterolisthesis due to facet degenerative disease.  Status post L5-S1 fusion.  Electronically Signed   By: Inge Rise M.D.   On: 10/06/2019 14:12 DG Cervical Spine With Flex & Extend CLINICAL DATA:  Neck pain.  No known injury.  EXAM: CERVICAL SPINE COMPLETE WITH FLEXION AND EXTENSION VIEWS  COMPARISON:  None.  FINDINGS: Vertebral body height and alignment are maintained. There is mild loss of disc space height and endplate spurring at G3-8. No pathologic motion is seen with flexion or extension. Neural foramina appear open. Prevertebral soft tissues are normal. Lung apices are clear.  IMPRESSION: Mild appearing degenerative disc disease  C5-6. The exam is otherwise negative.  Electronically Signed   By: Inge Rise M.D.   On: 10/06/2019 14:04  Assessment  The primary encounter diagnosis was Chronic pain syndrome. 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  Plan of Care  Problem-specific:  No problem-specific Assessment & Plan notes found for this encounter.  Ms. Nitya Cauthon has a current medication list which includes the following long-term medication(s): [START ON 04/01/2020] cyclobenzaprine, [START ON 04/01/2020] gabapentin, [START ON 03/02/2020] hydrocodone-acetaminophen, [START ON 04/01/2020] hydrocodone-acetaminophen, [START ON 05/01/2020] hydrocodone-acetaminophen, [START ON 04/01/2020] melatonin, [START ON 04/01/2020] meloxicam, and triamcinolone ointment.  Pharmacotherapy (Medications Ordered): Meds ordered this encounter  Medications  . HYDROcodone-acetaminophen (NORCO) 10-325 MG tablet    Sig: Take 1 tablet by mouth every 12 (twelve) hours as needed for severe pain. Must last 30 days    Dispense:  45 tablet    Refill:  0    Chronic Pain: STOP Act (Not applicable) Fill 1 day early if closed on refill date. Do not fill until: 03/02/2020. To last until: 04/01/2020. Avoid benzodiazepines within 8 hours of opioids  . HYDROcodone-acetaminophen (NORCO) 10-325 MG tablet    Sig: Take 1 tablet by mouth every 12 (twelve) hours as needed for severe pain. Must last 30 days    Dispense:  45 tablet    Refill:  0    Chronic Pain: STOP Act (Not applicable) Fill 1 day early if  closed on refill date. Do not fill until: 04/01/2020. To last until: 05/01/2020. Avoid benzodiazepines within 8 hours of opioids  . HYDROcodone-acetaminophen (NORCO) 10-325 MG tablet    Sig: Take 1 tablet by mouth every 12 (twelve) hours as needed for severe pain. Must last 30 days    Dispense:  45 tablet    Refill:  0    Chronic Pain: STOP Act (Not applicable) Fill 1 day early if closed on refill date. Do not fill until: 05/01/2020. To last until: 05/31/2020. Avoid benzodiazepines within 8 hours of opioids  . Melatonin ([facility] MELATONIN) 10 MG CAPS    Sig: Take  10-20 mg by mouth at bedtime.    Dispense:  90 capsule    Refill:  1    Fill one day early if pharmacy is closed on scheduled refill date. May substitute for generic if available.  . cyclobenzaprine (FLEXERIL) 10 MG tablet    Sig: Take 1 tablet (10 mg total) by mouth daily.    Dispense:  90 tablet    Refill:  1    Fill one day early if pharmacy is closed on scheduled refill date. May substitute for generic if available.  . gabapentin (NEURONTIN) 800 MG tablet    Sig: Take 1 tablet (800 mg total) by mouth 3 (three) times daily.    Dispense:  270 tablet    Refill:  1    Fill one day early if pharmacy is closed on scheduled refill date. May substitute for generic if available.  . meloxicam (MOBIC) 15 MG tablet    Sig: Take 1 tablet (15 mg total) by mouth every morning.    Dispense:  90 tablet    Refill:  1    Fill one day early if pharmacy is closed on scheduled refill date. May substitute for generic if available.   Orders:  Orders Placed This Encounter  Procedures  . ToxASSURE Select 13 (MW), Urine    Volume: 30 ml(s). Minimum 3 ml of urine is needed. Document temperature of fresh sample. Indications: Long term (current) use of opiate analgesic (F79.024)    Order Specific Question:   Release to patient    Answer:   Immediate   Follow-up plan:   Return in about 3 months (around 05/29/2020) for (F2F), (MM).      Interventional  therapies: Planned, scheduled, and/or pending:   Patient seen at [facility] in [place] for right knee pathology.   Considering:   Diagnostic bilateral lumbar facet block Possible bilateral lumbar facet RFA Diagnostic Caudal ESI Diagnostic Bilateral L4-5 & L5-S1Lumbar TFESI Diagnostic bilateral sacroiliac joint block Possible bilateral sacroiliac joint RFA Diagnostic bilateral hip joint injection Possible bilateral hip joint RFA Diagnostic bilateral knee joint injection Possible series of Hyalgan knee injections Diagnostic bilateral genicular nerve block Possible bilateral genicular nerve RFA.   Palliative PRN treatment(s):   Palliative Caudal ESI       Recent Visits No visits were found meeting these conditions.  Showing recent visits within past 90 days and meeting all other requirements   Today's Visits Date Type Provider Dept  02/26/20 Telemedicine Milinda Pointer, MD [facility]  Showing today's visits and meeting all other requirements   Future Appointments No visits were found meeting these conditions.  Showing future appointments within next 90 days and meeting all other requirements   I discussed the assessment and treatment plan with the patient. The patient was provided an opportunity to ask questions and all were answered. The patient agreed with the plan and demonstrated an understanding of the instructions.  Patient advised to call back or seek an in-person evaluation if the symptoms or condition worsens.  Duration of encounter: 12 minutes.  Note by: Gaspar Cola, MD Date: 02/26/2020; Time: 10:50 AM

## 2020-02-26 ENCOUNTER — Other Ambulatory Visit: Payer: Self-pay

## 2020-02-26 ENCOUNTER — Ambulatory Visit: Payer: No Typology Code available for payment source | Attending: Pain Medicine | Admitting: Pain Medicine

## 2020-02-26 DIAGNOSIS — M5441 Lumbago with sciatica, right side: Secondary | ICD-10-CM

## 2020-02-26 DIAGNOSIS — M79604 Pain in right leg: Secondary | ICD-10-CM | POA: Diagnosis not present

## 2020-02-26 DIAGNOSIS — M48061 Spinal stenosis, lumbar region without neurogenic claudication: Secondary | ICD-10-CM

## 2020-02-26 DIAGNOSIS — M62830 Muscle spasm of back: Secondary | ICD-10-CM

## 2020-02-26 DIAGNOSIS — G894 Chronic pain syndrome: Secondary | ICD-10-CM | POA: Diagnosis not present

## 2020-02-26 DIAGNOSIS — M79605 Pain in left leg: Secondary | ICD-10-CM

## 2020-02-26 DIAGNOSIS — M7918 Myalgia, other site: Secondary | ICD-10-CM

## 2020-02-26 DIAGNOSIS — M961 Postlaminectomy syndrome, not elsewhere classified: Secondary | ICD-10-CM

## 2020-02-26 DIAGNOSIS — Z79899 Other long term (current) drug therapy: Secondary | ICD-10-CM

## 2020-02-26 DIAGNOSIS — M792 Neuralgia and neuritis, unspecified: Secondary | ICD-10-CM

## 2020-02-26 DIAGNOSIS — G4701 Insomnia due to medical condition: Secondary | ICD-10-CM

## 2020-02-26 DIAGNOSIS — M5442 Lumbago with sciatica, left side: Secondary | ICD-10-CM

## 2020-02-26 DIAGNOSIS — M4316 Spondylolisthesis, lumbar region: Secondary | ICD-10-CM

## 2020-02-26 DIAGNOSIS — G96198 Other disorders of meninges, not elsewhere classified: Secondary | ICD-10-CM

## 2020-02-26 DIAGNOSIS — M48062 Spinal stenosis, lumbar region with neurogenic claudication: Secondary | ICD-10-CM | POA: Diagnosis not present

## 2020-02-26 DIAGNOSIS — G8929 Other chronic pain: Secondary | ICD-10-CM

## 2020-02-26 MED ORDER — HYDROCODONE-ACETAMINOPHEN 10-325 MG PO TABS: 1.0000 | ORAL_TABLET | Freq: Two times a day (BID) | ORAL | 0 refills | Status: DC | PRN

## 2020-02-26 MED ORDER — CYCLOBENZAPRINE HCL 10 MG PO TABS: 10.0000 mg | ORAL_TABLET | Freq: Every day | ORAL | 1 refills | Status: DC

## 2020-02-26 MED ORDER — MELOXICAM 15 MG PO TABS: 15.0000 mg | ORAL_TABLET | Freq: Every morning | ORAL | 1 refills | Status: DC

## 2020-02-26 MED ORDER — GABAPENTIN 800 MG PO TABS: 800.0000 mg | ORAL_TABLET | Freq: Three times a day (TID) | ORAL | 1 refills | Status: DC

## 2020-02-26 MED ORDER — MELATONIN 10 MG PO CAPS: 10.0000 mg | ORAL_CAPSULE | Freq: Every day | ORAL | 1 refills | Status: DC

## 2020-03-01 LAB — TOXASSURE SELECT 13 (MW), URINE

## 2020-05-14 DIAGNOSIS — M172 Bilateral post-traumatic osteoarthritis of knee: Secondary | ICD-10-CM | POA: Diagnosis not present

## 2020-05-26 NOTE — Progress Notes (Signed)
PROVIDER NOTE: Information contained herein reflects review and annotations entered in association with encounter. Interpretation of such information and data should be left to medically-trained personnel. Information provided to patient can be located elsewhere in the medical record under "Patient Instructions". Document created using STT-dictation technology, any transcriptional errors that may result from process are unintentional.    Patient: Stephanie [name]  Service Category: E/M  Provider: Gaspar Cola, MD  DOB: Feb 26, 1961  DOS: 05/27/2020  Specialty: Interventional Pain Management  MRN: [id number]  Setting: Ambulatory outpatient  PCP: [facility]  Type: Established Patient    Referring Provider: [facility]  Location: Office  Delivery: Face-to-face     HPI  Reason for encounter: [name], a 59 y.o. year old female, is here today for evaluation and management of her Chronic pain syndrome [G89.4]. [name] primary complain today is Back Pain (low) Last encounter: Practice (Visit date not found). My last encounter with her was on Visit date not found. Pertinent problems: 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); Musculoskeletal pain; Neurogenic pain; Spasm of paraspinal muscle; Chronic hip pain (Bilateral) (R>L); Osteoarthritis of knee (Bilateral); Osteoarthritis of hip (Bilateral); Osteoarthritis of sacroiliac joint (Bilateral); Chronic  sacroiliac joint pain (Bilateral); Chronic pain syndrome; Failed back surgical syndrome; Eczema of hands (Bilateral) (R>L); Abnormal MRI, knee (04/04/2018); 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 Pain Assessment: Severity of Chronic pain is reported as a 5 /10. Location: Back Lower/legs, right anterior, left posterior. Onset: More than a month ago. Quality: Sharp, Constant, Burning. Timing: Intermittent. Modifying factor(s): medications, hot baths, sitting down. Vitals:  height is 5' 7"  (1.702 m) and weight is 201 lb (91.2 kg). Her oral temperature is 98.4 F (36.9 C). Her blood pressure is 117/73 and her pulse is 85. Her respiration is 18 and oxygen saturation is 100%.   Lost husband due to pulmonary fibrosis.  She comes into the clinic today indicating that she is interested in going up on her hydrocodone.  This triggered a very long conversation regarding tolerance and the use of "Drug Holidays".  The patient also volunteered that she had received some knee injections by Rogers Blocker, PA (Dr. Clydell Hakim PA) at the [facility].  I reminded the patient that we do not take patients for medication management and that once you join our practice what we do is we take over all of the injection therapies including the knee injections.  We then proceeded to try to figure out why she needed the increase in the medication and as it turns out patient indicates that her primary pain today is that of the lower back, bilaterally, with the right being worse than the left.  She reminded me that she had a surgery of the lumbar spine done in 2010 by a surgeon in [place].  The patient is second worst pain is that of the lower  extremities, bilaterally, with  the right being worse than the left.  In the case of the right lower extremity the pain goes to the top of the foot and what seems to be an L5 dermatomal distribution.  In the case of the left leg it goes down to just below the knee and she indicates having some cramps in the inner portion of the leg.  Provocative physical exam today was positive for an L5 radiculopathy where she had difficulty with heel walking on her right leg.  She was able to toe walk without any difficulties.  In addition, she was complaining of some pain in the area of the hip and therefore we also tested this as well.  Provocative testing of the hip joints proved to be positive bilaterally for hip arthropathy and significant decreased range of motion, bilaterally.  In addition, she also proved to have significant pain on her right knee with decreased range of motion.  The patient also mentioned that today her third worst pain was that of the knees, bilaterally with the right being worse than the left.  These were the ones that she had the injections done.  She reminded me that she had a surgery on the right knee for a torn meniscus but she continues to have spurs and she had the bilateral cortisone injection on both knees, less than a month ago.  Pharmacotherapy Assessment   Analgesic: Hydrocodone/acetaminophen 10/325 mg, 1 tab PO BID (20 mg/day of hydrocodone) MME/day:17m/day.   Monitoring: [place] PMP: PDMP reviewed during this encounter.       Pharmacotherapy: No side-effects or adverse reactions reported. Compliance: No problems identified. Effectiveness: Clinically acceptable.  SHart Rochester RN  05/27/2020  8:22 AM  Sign when Signing Visit Nursing Pain Medication Assessment:  Safety precautions to be maintained throughout the outpatient stay will include: orient to surroundings, keep bed in low position, maintain call bell within reach at all times, provide assistance with transfer out of bed and ambulation.   Medication Inspection Compliance: Pill count conducted under aseptic conditions, in front of the patient. Neither the pills nor the bottle was removed from the patient's sight at any time. Once count was completed pills were immediately returned to the patient in their original bottle.  Medication: Hydrocodone/APAP Pill/Patch Count: 11 of 45 pills remain Pill/Patch Appearance: Markings consistent with prescribed medication Bottle Appearance: Standard pharmacy container. Clearly labeled. Filled Date: 07 / 02 / 2021 Last Medication intake:  Today    UDS:  Summary  Date Value Ref Range Status  02/27/2020 Note  Final    Comment:    ==================================================================== [employer] Select 13 (MW) ==================================================================== Test                             Result       Flag       Units Drug Present and Declared for Prescription Verification   Hydrocodone                    1404         EXPECTED   ng/mg creat   Hydromorphone                  241          EXPECTED   ng/mg creat   Dihydrocodeine                 99  EXPECTED   ng/mg creat   Norhydrocodone                 >1866        EXPECTED   ng/mg creat    Sources of hydrocodone include scheduled prescription medications.    Hydromorphone, dihydrocodeine and norhydrocodone are expected    metabolites of hydrocodone. Hydromorphone and dihydrocodeine are    also available as scheduled prescription medications. ==================================================================== Test                      Result    Flag   Units      Ref Range   Creatinine              268              mg/dL      >=20 ==================================================================== Declared Medications:  The flagging and interpretation on this report are based on the  following declared medications.  Unexpected results may arise from  inaccuracies in the declared  medications.  **Note: The testing scope of this panel includes these medications:  Hydrocodone  **Note: The testing scope of this panel does not include the  following reported medications:  Acetaminophen  Cyclobenzaprine  Gabapentin  Melatonin  Meloxicam  Triamcinolone (Kenalog) ==================================================================== For clinical consultation, please call [phone]. ====================================================================      ROS  Constitutional: Denies any fever or chills Gastrointestinal: No reported hemesis, hematochezia, vomiting, or acute GI distress Musculoskeletal: Denies any acute onset joint swelling, redness, loss of ROM, or weakness Neurological: No reported episodes of acute onset apraxia, aphasia, dysarthria, agnosia, amnesia, paralysis, loss of coordination, or loss of consciousness  Medication Review  B-12, HYDROcodone-acetaminophen, Melatonin, cyclobenzaprine, gabapentin, meloxicam, and triamcinolone ointment  History Review  Allergy: [name] is allergic to phenergan [promethazine hcl]. Drug: [name]  reports no history of drug use. Alcohol:  reports no history of alcohol use. Tobacco:  reports that she has never smoked. She has never used smokeless tobacco. Social: [name]  reports that she has never smoked. She has never used smokeless tobacco. She reports that she does not drink alcohol and does not use drugs. Medical:  has a past medical history of Allergy, Anemia, iron deficiency (04/09/2014), Arthritis, Atypical chest pain (04/09/2014), CD (contact dermatitis) (03/01/2014), Chest pain (04/09/2014), and Lumbar radicular pain (08/22/2015). Surgical: [name]  has a past surgical history that includes Back surgery; Appendectomy; Cholecystectomy; Cesarean section; and Bunionectomy. Family: family history includes Asthma in her father; Stroke in her mother.  Laboratory Chemistry Profile   Renal Lab  Results  Component Value Date   BUN 13 09/01/2019   CREATININE 0.44 09/01/2019   GFRAA >60 09/01/2019   GFRNONAA >60 09/01/2019     Hepatic Lab Results  Component Value Date   AST 15 09/01/2019   ALT 13 09/01/2019   ALBUMIN 4.2 09/01/2019   ALKPHOS 61 09/01/2019     Electrolytes Lab Results  Component Value Date   NA 140 09/01/2019   K 4.0 09/01/2019   CL 107 09/01/2019   CALCIUM 9.3 09/01/2019   MG 1.9 09/01/2019     Bone Lab Results  Component Value Date   VD25OH 33.18 09/01/2019   25OHVITD1 34 05/07/2016   25OHVITD2 <1.0 05/07/2016   25OHVITD3 34 05/07/2016     Inflammation (CRP: Acute Phase) (ESR: Chronic Phase) Lab Results  Component Value Date   CRP <0.8 09/01/2019   ESRSEDRATE 18 09/01/2019  Note: Above Lab results reviewed.  Recent Imaging Review  DG Lumbar Spine Complete w/ Flex/Ext (6 Views) CLINICAL DATA:  Chronic low back pain radiating into the right leg. Symptoms have worsened over the past 3 weeks. No known injury. History of prior lumbar surgery.  EXAM: LUMBAR SPINE - COMPLETE WITH BENDING VIEWS  COMPARISON:  Plain films lumbar spine 04/15/2016.  FINDINGS: Vertebral body height is maintained. 0.5 cm anterolisthesis L4 on L5 is unchanged. There is approximately 1.4 cm anterolisthesis L5 on S1. The patient is status post L5-S1 fusion. Hardware is intact. Mild lucency about the left S1 screw is unchanged. Mild loss of disc space height at L4-5 is also unchanged. No pathologic motion is seen with flexion or extension. Paraspinous structures are unremarkable.  IMPRESSION: No new abnormality compared to the prior examination.  Degenerative disease most notable at L4-5 where there is some loss of disc space height and 0.5 cm anterolisthesis due to facet degenerative disease.  Status post L5-S1 fusion.  Electronically Signed   By: Inge Rise M.D.   On: 10/06/2019 14:12 DG Cervical Spine With Flex & Extend CLINICAL DATA:   Neck pain.  No known injury.  EXAM: CERVICAL SPINE COMPLETE WITH FLEXION AND EXTENSION VIEWS  COMPARISON:  None.  FINDINGS: Vertebral body height and alignment are maintained. There is mild loss of disc space height and endplate spurring at T6-2. No pathologic motion is seen with flexion or extension. Neural foramina appear open. Prevertebral soft tissues are normal. Lung apices are clear.  IMPRESSION: Mild appearing degenerative disc disease C5-6. The exam is otherwise negative.  Electronically Signed   By: Inge Rise M.D.   On: 10/06/2019 14:04 Note: Reviewed        Physical Exam  General appearance: Well nourished, well developed, and well hydrated. In no apparent acute distress Mental status: Alert, oriented x 3 (person, place, & time)       Respiratory: No evidence of acute respiratory distress Eyes: PERLA Vitals: BP 117/73   Pulse 85   Temp 98.4 F (36.9 C) (Oral)   Resp 18   Ht 5' 7"  (1.702 m)   Wt 201 lb (91.2 kg)   LMP  (LMP Unknown)   SpO2 100%   BMI 31.48 kg/m  BMI: Estimated body mass index is 31.48 kg/m as calculated from the following:   Height as of this encounter: 5' 7"  (1.702 m).   Weight as of this encounter: 201 lb (91.2 kg). Ideal: Ideal body weight: 61.6 kg (135 lb 12.9 oz) Adjusted ideal body weight: 73.4 kg (161 lb 14.1 oz)  Assessment   Status Diagnosis  Controlled Controlled Controlled 1. Chronic pain syndrome   2. Chronic low back pain (Primary area of Pain) (Bilateral) (L>R)   3. Chronic lower extremity pain (Secondary Area of Pain) (Bilateral) (L>R)   4. Chronic knee pain (Tertiary Area of Pain) (Bilateral) (R>L)   5. Depression, unspecified depression type   6. Vitamin B12 deficiency   7. Failed back surgical syndrome   8. Epidural fibrosis   9. Chronic pain of both hips      Updated Problems: Problem  Chronic hip pain (Bilateral) (R>L)  Sleep Apnea  Vitamin B12 Deficiency   Overview:  borderline  Formatting of  this note might be different from the original. borderline   Vitamin D Deficiency  Depression    Plan of Care  Problem-specific:  No problem-specific Assessment & Plan notes found for this encounter.  Ms. Kayleann Stephanie [name] has a  current medication list which includes the following long-term medication(s): cyclobenzaprine, gabapentin, melatonin, meloxicam, triamcinolone ointment, and [START ON 05/31/2020] hydrocodone-acetaminophen.  Pharmacotherapy (Medications Ordered): Meds ordered this encounter  Medications  . HYDROcodone-acetaminophen (NORCO) 10-325 MG tablet    Sig: Take 1 tablet by mouth every 12 (twelve) hours as needed for severe pain. Must last 30 days    Dispense:  45 tablet    Refill:  0    Chronic Pain: STOP Act (Not applicable) Fill 1 day early if closed on refill date. Do not fill until: 05/31/2020. To last until: 06/30/2020. Avoid benzodiazepines within 8 hours of opioids  . Cyanocobalamin (B-12) 5000 MCG SUBL    Sig: Place 5,000 mcg under the tongue daily.    Dispense:  30 tablet    Refill:  0    Do not place medication on "Automatic Refill". Fill one day early if pharmacy is closed on scheduled refill date.   Orders:  Orders Placed This Encounter  Procedures  . Caudal Epidural Injection    Standing Status:   Future    Standing Expiration Date:   06/27/2020    Scheduling Instructions:     Laterality: Right-sided     Level(s): Sacrococcygeal canal (Tailbone area)     Sedation: With Sedation     Scheduling Timeframe: As soon as pre-approved    Order Specific Question:   Where will this procedure be performed?    Answer:   [facility]  . DG HIP UNILAT W OR W/O PELVIS 2-3 VIEWS RIGHT    Standing Status:   Future    Standing Expiration Date:   05/27/2021    Scheduling Instructions:     Please describe any evidence of DJD, such as joint narrowing, asymmetry, cysts, or any anomalies in bone density, production, or erosion.    Order Specific Question:    Reason for Exam (SYMPTOM  OR DIAGNOSIS REQUIRED)    Answer:   Right hip pain/arthralgia    Order Specific Question:   Is the patient pregnant?    Answer:   No    Order Specific Question:   Preferred imaging location?    Answer:   [facility]    Order Specific Question:   Call Results- Best Contact Number?    Answer:   [phone] (Pain Clinic facility) (Dr. Dossie Arbour)  . DG HIP UNILAT W OR W/O PELVIS 2-3 VIEWS LEFT    Standing Status:   Future    Standing Expiration Date:   05/27/2021    Scheduling Instructions:     Please describe any evidence of DJD, such as joint narrowing, asymmetry, cysts, or any anomalies in bone density, production, or erosion.    Order Specific Question:   Reason for Exam (SYMPTOM  OR DIAGNOSIS REQUIRED)    Answer:   Right hip pain/arthralgia    Order Specific Question:   Is the patient pregnant?    Answer:   No    Order Specific Question:   Preferred imaging location?    Answer:   [facility]    Order Specific Question:   Call Results- Best Contact Number?    Answer:   [phone] (Pain Clinic facility) (Dr. Dossie Arbour)  . Ambulatory referral to Psychiatry    Referral Priority:   Routine    Referral Type:   Psychiatric    Referral Reason:   Specialty Services Required    Requested Specialty:   Psychiatry    Number of Visits Requested:   1  Follow-up plan:   Return for Procedure (w/ sedation): (R) Caudal ESI #2 + Epidurogram, (ASAP).      Interventional therapies: Planned, scheduled, and/or pending:   Diagnostic right-sided caudal ESI #2 under fluoroscopic guidance and IV sedation    Considering:   Diagnostic bilateral lumbar facet block Possible bilateral lumbar facet RFA Diagnostic Caudal ESI Diagnostic Bilateral L4-5 & L5-S1Lumbar TFESI Diagnostic bilateral sacroiliac joint block Possible bilateral sacroiliac joint RFA Diagnostic bilateral hip joint injection Possible bilateral hip joint RFA Diagnostic bilateral knee joint  injection Possible series of Hyalgan knee injections Diagnostic bilateral genicular nerve block Possible bilateral genicular nerve RFA.  Possible Racz procedure    Palliative PRN treatment(s):   Palliative Caudal ESI #2 (Last done 02/01/2017) (100/100/80/80)    Recent Visits No visits were found meeting these conditions. Showing recent visits within past 90 days and meeting all other requirements Today's Visits Date Type Provider Dept  05/27/20 Office Visit Milinda Pointer, MD [facility]  Showing today's visits and meeting all other requirements Future Appointments No visits were found meeting these conditions. Showing future appointments within next 90 days and meeting all other requirements  I discussed the assessment and treatment plan with the patient. The patient was provided an opportunity to ask questions and all were answered. The patient agreed with the plan and demonstrated an understanding of the instructions.  Patient advised to call back or seek an in-person evaluation if the symptoms or condition worsens.  Duration of encounter: 55 minutes.  Note by: Gaspar Cola, MD Date: 05/27/2020; Time: 9:27 AM

## 2020-05-27 ENCOUNTER — Ambulatory Visit: Payer: No Typology Code available for payment source | Attending: Pain Medicine | Admitting: Pain Medicine

## 2020-05-27 ENCOUNTER — Encounter: Payer: Self-pay | Admitting: Pain Medicine

## 2020-05-27 ENCOUNTER — Other Ambulatory Visit: Payer: Self-pay

## 2020-05-27 VITALS — BP 117/73 | HR 85 | Temp 98.4°F | Resp 18 | Ht 67.0 in | Wt 201.0 lb

## 2020-05-27 DIAGNOSIS — M5442 Lumbago with sciatica, left side: Secondary | ICD-10-CM | POA: Diagnosis present

## 2020-05-27 DIAGNOSIS — M25551 Pain in right hip: Secondary | ICD-10-CM | POA: Diagnosis present

## 2020-05-27 DIAGNOSIS — M25562 Pain in left knee: Secondary | ICD-10-CM | POA: Diagnosis present

## 2020-05-27 DIAGNOSIS — M961 Postlaminectomy syndrome, not elsewhere classified: Secondary | ICD-10-CM | POA: Diagnosis present

## 2020-05-27 DIAGNOSIS — G894 Chronic pain syndrome: Secondary | ICD-10-CM | POA: Insufficient documentation

## 2020-05-27 DIAGNOSIS — M5441 Lumbago with sciatica, right side: Secondary | ICD-10-CM | POA: Diagnosis present

## 2020-05-27 DIAGNOSIS — G8929 Other chronic pain: Secondary | ICD-10-CM | POA: Diagnosis present

## 2020-05-27 DIAGNOSIS — M79605 Pain in left leg: Secondary | ICD-10-CM | POA: Diagnosis present

## 2020-05-27 DIAGNOSIS — G96198 Other disorders of meninges, not elsewhere classified: Secondary | ICD-10-CM | POA: Diagnosis present

## 2020-05-27 DIAGNOSIS — M25552 Pain in left hip: Secondary | ICD-10-CM | POA: Insufficient documentation

## 2020-05-27 DIAGNOSIS — E538 Deficiency of other specified B group vitamins: Secondary | ICD-10-CM | POA: Diagnosis present

## 2020-05-27 DIAGNOSIS — F32A Depression, unspecified: Secondary | ICD-10-CM | POA: Insufficient documentation

## 2020-05-27 DIAGNOSIS — F329 Major depressive disorder, single episode, unspecified: Secondary | ICD-10-CM | POA: Diagnosis present

## 2020-05-27 DIAGNOSIS — M79604 Pain in right leg: Secondary | ICD-10-CM | POA: Diagnosis present

## 2020-05-27 DIAGNOSIS — M25561 Pain in right knee: Secondary | ICD-10-CM | POA: Diagnosis present

## 2020-05-27 MED ORDER — HYDROCODONE-ACETAMINOPHEN 10-325 MG PO TABS: 1.0000 | ORAL_TABLET | Freq: Two times a day (BID) | ORAL | 0 refills | Status: DC | PRN

## 2020-05-27 MED ORDER — B-12 5000 MCG SL SUBL: 5000.0000 ug | SUBLINGUAL_TABLET | Freq: Every day | SUBLINGUAL | 0 refills | Status: AC

## 2020-05-27 NOTE — Progress Notes (Signed)
Nursing Pain Medication Assessment:  Safety precautions to be maintained throughout the outpatient stay will include: orient to surroundings, keep bed in low position, maintain call bell within reach at all times, provide assistance with transfer out of bed and ambulation.  Medication Inspection Compliance: Pill count conducted under aseptic conditions, in front of the patient. Neither the pills nor the bottle was removed from the patient's sight at any time. Once count was completed pills were immediately returned to the patient in their original bottle.  Medication: Hydrocodone/APAP Pill/Patch Count: 11 of 45 pills remain Pill/Patch Appearance: Markings consistent with prescribed medication Bottle Appearance: Standard pharmacy container. Clearly labeled. Filled Date: 07 / 02 / 2021 Last Medication intake:  Today

## 2020-05-27 NOTE — Patient Instructions (Addendum)
____________________________________________________________________________________________  Drug Holidays (Slow)  What is a "Drug Holiday"? Drug Holiday: is the name given to the period of time during which a patient stops taking a medication(s) for the purpose of eliminating tolerance to the drug.  Benefits . Improved effectiveness of opioids. . Decreased opioid dose needed to achieve benefits. . Improved pain with lesser dose.  What is tolerance? Tolerance: is the progressive decreased in effectiveness of a drug due to its repetitive use. With repetitive use, the body gets use to the medication and as a consequence, it loses its effectiveness. This is a common problem seen with opioid pain medications. As a result, a larger dose of the drug is needed to achieve the same effect that used to be obtained with a smaller dose.  How long should a "Drug Holiday" last? You should stay off of the pain medicine for at least 14 consecutive days. (2 weeks)  Should I stop the medicine "cold turkey"? No. You should always coordinate with your Pain Specialist so that he/she can provide you with the correct medication dose to make the transition as smoothly as possible.  How do I stop the medicine? Slowly. You will be instructed to decrease the daily amount of pills that you take by one (1) pill every seven (7) days. This is called a "slow downward taper" of your dose. For example: if you normally take four (4) pills per day, you will be asked to drop this dose to three (3) pills per day for seven (7) days, then to two (2) pills per day for seven (7) days, then to one (1) per day for seven (7) days, and at the end of those last seven (7) days, this is when the "Drug Holiday" would start.   Will I have withdrawals? By doing a "slow downward taper" like this one, it is unlikely that you will experience any significant withdrawal symptoms. Typically, what triggers withdrawals is the sudden stop of a high  dose opioid therapy. Withdrawals can usually be avoided by slowly decreasing the dose over a prolonged period of time. If you do not follow these instructions and decide to stop your medication abruptly, withdrawals may be possible.  What are withdrawals? Withdrawals: refers to the wide range of symptoms that occur after stopping or dramatically reducing opiate drugs after heavy and prolonged use. Withdrawal symptoms do not occur to patients that use low dose opioids, or those who take the medication sporadically. Contrary to benzodiazepine (example: Valium, Xanax, etc.) or alcohol withdrawals ("Delirium Tremens"), opioid withdrawals are not lethal. Withdrawals are the physical manifestation of the body getting rid of the excess receptors.  Expected Symptoms Early symptoms of withdrawal may include: . Agitation . Anxiety . Muscle aches . Increased tearing . Insomnia . Runny nose . Sweating . Yawning  Late symptoms of withdrawal may include: . Abdominal cramping . Diarrhea . Dilated pupils . Goose bumps . Nausea . Vomiting  Will I experience withdrawals? Due to the slow nature of the taper, it is very unlikely that you will experience any.  What is a slow taper? Taper: refers to the gradual decrease in dose.  (Last update: 05/22/2020) ____________________________________________________________________________________________    ____________________________________________________________________________________________  Medication Rules  Purpose: To inform patients, and their family members, of our rules and regulations.  Applies to: All patients receiving prescriptions (written or electronic).  Pharmacy of record: Pharmacy where electronic prescriptions will be sent. If written prescriptions are taken to a different pharmacy, please inform the nursing staff. The pharmacy   listed in the electronic medical record should be the one where you would like electronic prescriptions  to be sent.  Electronic prescriptions: In compliance with the [place] Strengthen Opioid Misuse Prevention (STOP) Act of 2017 (Session Law 2017-74/H243), effective November 02, 2018, all controlled substances must be electronically prescribed. Calling prescriptions to the pharmacy will cease to exist.  Prescription refills: Only during scheduled appointments. Applies to all prescriptions.  NOTE: The following applies primarily to controlled substances (Opioid* Pain Medications).   Type of encounter (visit): For patients receiving controlled substances, face-to-face visits are required. (Not an option or up to the patient.)  Patient's responsibilities: 1. Pain Pills: Bring all pain pills to every appointment (except for procedure appointments). 2. Pill Bottles: Bring pills in original pharmacy bottle. Always bring the newest bottle. Bring bottle, even if empty. 3. Medication refills: You are responsible for knowing and keeping track of what medications you take and those you need refilled. The day before your appointment: write a list of all prescriptions that need to be refilled. The day of the appointment: give the list to the admitting nurse. Prescriptions will be written only during appointments. No prescriptions will be written on procedure days. If you forget a medication: it will not be "Called in", "Faxed", or "electronically sent". You will need to get another appointment to get these prescribed. No early refills. Do not call asking to have your prescription filled early. 4. Prescription Accuracy: You are responsible for carefully inspecting your prescriptions before leaving our office. Have the discharge nurse carefully go over each prescription with you, before taking them home. Make sure that your name is accurately spelled, that your address is correct. Check the name and dose of your medication to make sure it is accurate. Check the number of pills, and the written instructions to  make sure they are clear and accurate. Make sure that you are given enough medication to last until your next medication refill appointment. 5. Taking Medication: Take medication as prescribed. When it comes to controlled substances, taking less pills or less frequently than prescribed is permitted and encouraged. Never take more pills than instructed. Never take medication more frequently than prescribed.  6. Inform other Doctors: Always inform, all of your healthcare providers, of all the medications you take. 7. Pain Medication from other Providers: You are not allowed to accept any additional pain medication from any other Doctor or Healthcare provider. There are two exceptions to this rule. (see below) In the event that you require additional pain medication, you are responsible for notifying us, as stated below. 8. Medication Agreement: You are responsible for carefully reading and following our Medication Agreement. This must be signed before receiving any prescriptions from our practice. Safely store a copy of your signed Agreement. Violations to the Agreement will result in no further prescriptions. (Additional copies of our Medication Agreement are available upon request.) 9. Laws, Rules, & Regulations: All patients are expected to follow all Federal and State Laws, Statutes, Rules, & Regulations. Ignorance of the Laws does not constitute a valid excuse.  10. Illegal drugs and Controlled Substances: The use of illegal substances (including, but not limited to marijuana and its derivatives) and/or the illegal use of any controlled substances is strictly prohibited. Violation of this rule may result in the immediate and permanent discontinuation of any and all prescriptions being written by our practice. The use of any illegal substances is prohibited. 11. Adopted CDC guidelines & recommendations: Target dosing levels will be at or   below 60 MME/day. Use of benzodiazepines** is not  recommended.  Exceptions: There are only two exceptions to the rule of not receiving pain medications from other Healthcare Providers. 1. Exception #1 (Emergencies): In the event of an emergency (i.e.: accident requiring emergency care), you are allowed to receive additional pain medication. However, you are responsible for: As soon as you are able, call our office [phone], at any time of the day or night, and leave a message stating your name, the date and nature of the emergency, and the name and dose of the medication prescribed. In the event that your call is answered by a member of our staff, make sure to document and save the date, time, and the name of the person that took your information.  2. Exception #2 (Planned Surgery): In the event that you are scheduled by another doctor or dentist to have any type of surgery or procedure, you are allowed (for a period no longer than 30 days), to receive additional pain medication, for the acute post-op pain. However, in this case, you are responsible for picking up a copy of our "Post-op Pain Management for Surgeons" handout, and giving it to your surgeon or dentist. This document is available at our office, and does not require an appointment to obtain it. Simply go to our office during business hours (Monday-Thursday from 8:00 AM to 4:00 PM) (Friday 8:00 AM to 12:00 Noon) or if you have a scheduled appointment with us, prior to your surgery, and ask for it by name. In addition, you will need to provide us with your name, name of your surgeon, type of surgery, and date of procedure or surgery.  *Opioid medications include: morphine, codeine, oxycodone, oxymorphone, hydrocodone, hydromorphone, meperidine, tramadol, tapentadol, buprenorphine, fentanyl, methadone. **Benzodiazepine medications include: diazepam (Valium), alprazolam (Xanax), clonazepam (Klonopine), lorazepam (Ativan), clorazepate (Tranxene), chlordiazepoxide (Librium), estazolam (Prosom),  oxazepam (Serax), temazepam (Restoril), triazolam (Halcion) (Last updated: 12/30/2017) ____________________________________________________________________________________________   ____________________________________________________________________________________________  Medication Recommendations and Reminders  Applies to: All patients receiving prescriptions (written and/or electronic).  Medication Rules & Regulations: These rules and regulations exist for your safety and that of others. They are not flexible and neither are we. Dismissing or ignoring them will be considered "non-compliance" with medication therapy, resulting in complete and irreversible termination of such therapy. (See document titled "Medication Rules" for more details.) In all conscience, because of safety reasons, we cannot continue providing a therapy where the patient does not follow instructions.  Pharmacy of record:   Definition: This is the pharmacy where your electronic prescriptions will be sent.   We do not endorse any particular pharmacy, however, we have experienced problems with [facility] not securing enough medication supply for the community.  We do not restrict you in your choice of pharmacy. However, once we write for your prescriptions, we will NOT be re-sending more prescriptions to fix restricted supply problems created by your pharmacy, or your insurance.   The pharmacy listed in the electronic medical record should be the one where you want electronic prescriptions to be sent.  If you choose to change pharmacy, simply notify our nursing staff.  Recommendations:  Keep all of your pain medications in a safe place, under lock and key, even if you live alone. We will NOT replace lost, stolen, or damaged medication.  After you fill your prescription, take 1 week's worth of pills and put them away in a safe place. You should keep a separate, properly labeled bottle for this purpose. The remainder    should be kept in the original bottle. Use this as your primary supply, until it runs out. Once it's gone, then you know that you have 1 week's worth of medicine, and it is time to come in for a prescription refill. If you do this correctly, it is unlikely that you will ever run out of medicine.  To make sure that the above recommendation works, it is very important that you make sure your medication refill appointments are scheduled at least 1 week before you run out of medicine. To do this in an effective manner, make sure that you do not leave the office without scheduling your next medication management appointment. Always ask the nursing staff to show you in your prescription , when your medication will be running out. Then arrange for the receptionist to get you a return appointment, at least 7 days before you run out of medicine. Do not wait until you have 1 or 2 pills left, to come in. This is very poor planning and does not take into consideration that we may need to cancel appointments due to bad weather, sickness, or emergencies affecting our staff.  DO NOT ACCEPT A "Partial Fill": If for any reason your pharmacy does not have enough pills/tablets to completely fill or refill your prescription, do not allow for a "partial fill". The law allows the pharmacy to complete that prescription within 72 hours, without requiring a new prescription. If they do not fill the rest of your prescription within those 72 hours, you will need a separate prescription to fill the remaining amount, which we will NOT provide. If the reason for the partial fill is your insurance, you will need to talk to the pharmacist about payment alternatives for the remaining tablets, but again, DO NOT ACCEPT A PARTIAL FILL, unless you can trust your pharmacist to obtain the remainder of the pills within 72 hours.  Prescription refills and/or changes in medication(s):   Prescription refills, and/or changes in dose or medication,  will be conducted only during scheduled medication management appointments. (Applies to both, written and electronic prescriptions.)  No refills on procedure days. No medication will be changed or started on procedure days. No changes, adjustments, and/or refills will be conducted on a procedure day. Doing so will interfere with the diagnostic portion of the procedure.  No phone refills. No medications will be "called into the pharmacy".  No Fax refills.  No weekend refills.  No Holliday refills.  No after hours refills.  Remember:  Business hours are:  Monday to Thursday 8:00 AM to 4:00 PM Provider's Schedule: Laekyn Rayos, MD - Appointments are:  Medication management: Monday and Wednesday 8:00 AM to 4:00 PM Procedure day: Tuesday and Thursday 7:30 AM to 4:00 PM Bilal Lateef, MD - Appointments are:  Medication management: Tuesday and Thursday 8:00 AM to 4:00 PM Procedure day: Monday and Wednesday 7:30 AM to 4:00 PM (Last update: 05/22/2020) ____________________________________________________________________________________________   ____________________________________________________________________________________________  CANNABIDIOL (AKA: CBD Oil or Pills)  Applies to: All patients receiving prescriptions of controlled substances (written and/or electronic).  General Information: Cannabidiol (CBD), a derivative of Marijuana, was discovered in 1940. It is one of some 113 identified cannabinoids in cannabis (Marijuana) plants, accounting for up to 40% of the plant's extract. As of 2018, preliminary clinical research on cannabidiol included studies of anxiety, cognition, movement disorders, and pain.  Cannabidiol is consummed in multiple ways, including inhalation of cannabis smoke or vapor, as an aerosol spray into the cheek, and by mouth. It   may be supplied as CBD oil containing CBD as the active ingredient (no added tetrahydrocannabinol (THC) or terpenes), a full-plant  CBD-dominant hemp extract oil, capsules, dried cannabis, or as a liquid solution. CBD is thought not have the same psychoactivity as THC, and may affect the actions of THC. Studies suggest that CBD may interact with different biological targets, including cannabinoid receptors and other neurotransmitter receptors. As of 2018 the mechanism of action for its biological effects has not been determined.  In the [place], cannabidiol has a limited approval by the Food and Drug Administration (FDA) for treatment of only two types of epilepsy disorders. The side effects of long-term use of the drug include somnolence, decreased appetite, diarrhea, fatigue, malaise, weakness, sleeping problems, and others.  CBD remains a Schedule I drug prohibited for any use.  Legality: Some manufacturers ship CBD products nationally, an illegal action which the FDA has not enforced in 2018, with CBD remaining the subject of an FDA investigational new drug evaluation, and is not considered legal as a dietary supplement or food ingredient as of December 2018. Federal illegality has made it difficult historically to conduct research on CBD. CBD is openly sold in head shops and health food stores in some states where such sales have not been explicitly legalized.  Warning: Because it is not FDA approved for general use or treatment of pain, it is not required to undergo the same manufacturing controls as prescription drugs.  This means that the available cannabidiol (CBD) may be contaminated with THC.  If this is the case, it will trigger a positive urine drug screen (UDS) test for cannabinoids (Marijuana).  Because a positive UDS for illicit substances is a violation of our medication agreement, your opioid analgesics (pain medicine) may be permanently discontinued. (Last update:  05/22/2020) ____________________________________________________________________________________________   ____________________________________________________________________________________________  Preparing for Procedure with Sedation  Procedure appointments are limited to planned procedures: . No Prescription Refills. . No disability issues will be discussed. . No medication changes will be discussed.  Instructions: . Oral Intake: Do not eat or drink anything for at least 8 hours prior to your procedure. (Exception: Blood Pressure Medication. See below.) . Transportation: Unless otherwise stated by your physician, you may drive yourself after the procedure. . Blood Pressure Medicine: Do not forget to take your blood pressure medicine with a sip of water the morning of the procedure. If your Diastolic (lower reading)is above 100 mmHg, elective cases will be cancelled/rescheduled. . Blood thinners: These will need to be stopped for procedures. Notify our staff if you are taking any blood thinners. Depending on which one you take, there will be specific instructions on how and when to stop it. . Diabetics on insulin: Notify the staff so that you can be scheduled 1st case in the morning. If your diabetes requires high dose insulin, take only  of your normal insulin dose the morning of the procedure and notify the staff that you have done so. . Preventing infections: Shower with an antibacterial soap the morning of your procedure. . Build-up your immune system: Take 1000 mg of Vitamin C with every meal (3 times a day) the day prior to your procedure. Marland Kitchen Antibiotics: Inform the staff if you have a condition or reason that requires you to take antibiotics before dental procedures. . Pregnancy: If you are pregnant, call and cancel the procedure. . Sickness: If you have a cold, fever, or any active infections, call and cancel the procedure. . Arrival: You must be in  the facility at least 30  minutes prior to your scheduled procedure. . Children: Do not bring children with you. . Dress appropriately: Bring dark clothing that you would not mind if they get stained. . Valuables: Do not bring any jewelry or valuables.  Reasons to call and reschedule or cancel your procedure: (Following these recommendations will minimize the risk of a serious complication.) . Surgeries: Avoid having procedures within 2 weeks of any surgery. (Avoid for 2 weeks before or after any surgery). . Flu Shots: Avoid having procedures within 2 weeks of a flu shots or . (Avoid for 2 weeks before or after immunizations). . Barium: Avoid having a procedure within 7-10 days after having had a radiological study involving the use of radiological contrast. (Myelograms, Barium swallow or enema study). . Heart attacks: Avoid any elective procedures or surgeries for the initial 6 months after a "Myocardial Infarction" (Heart Attack). . Blood thinners: It is imperative that you stop these medications before procedures. Let us know if you if you take any blood thinner.  . Infection: Avoid procedures during or within two weeks of an infection (including chest colds or gastrointestinal problems). Symptoms associated with infections include: Localized redness, fever, chills, night sweats or profuse sweating, burning sensation when voiding, cough, congestion, stuffiness, runny nose, sore throat, diarrhea, nausea, vomiting, cold or Flu symptoms, recent or current infections. It is specially important if the infection is over the area that we intend to treat. Marland Kitchen Heart and lung problems: Symptoms that may suggest an active cardiopulmonary problem include: cough, chest pain, breathing difficulties or shortness of breath, dizziness, ankle swelling, uncontrolled high or unusually low blood pressure, and/or palpitations. If you are experiencing any of these symptoms, cancel your procedure and contact your primary care physician for an  evaluation.  Remember:  Regular Business hours are:  Monday to Thursday 8:00 AM to 4:00 PM  Provider's Schedule: Milinda Pointer, MD:  Procedure days: Tuesday and Thursday 7:30 AM to 4:00 PM  Gillis Santa, MD:  Procedure days: Monday and Wednesday 7:30 AM to 4:00 PM ____________________________________________________________________________________________ Prescriptions for Hydrocodone and Vitamin B12 have been sent to your pharmacy.  An order for psych referral has been placed.

## 2020-05-28 ENCOUNTER — Ambulatory Visit: Payer: Medicare Other | Admitting: Pain Medicine

## 2020-07-09 ENCOUNTER — Telehealth: Payer: Self-pay

## 2020-07-09 NOTE — Telephone Encounter (Signed)
Voicemail left with patient that I have done research into her chart and can't find any indication as to what Dr Adalberto Cole intent was for prescribing.  I do see the visit from 05/27/20 and an Rx to fill on 05/31/20 for hydrocodone - apap 10-325 mg.  Told patient that I would communicate with Dr Dossie Arbour and let her know what he says.

## 2020-07-09 NOTE — Telephone Encounter (Signed)
She said she doesn't have any med refills from her July appt. She only had an august refill. She thought it was supposed to be more. Please check and call her back.

## 2020-07-10 ENCOUNTER — Other Ambulatory Visit: Payer: Self-pay

## 2020-07-10 ENCOUNTER — Ambulatory Visit: Payer: No Typology Code available for payment source | Attending: Pain Medicine | Admitting: Pain Medicine

## 2020-07-10 ENCOUNTER — Encounter: Payer: Self-pay | Admitting: Pain Medicine

## 2020-07-10 VITALS — BP 126/96 | HR 90 | Temp 97.4°F | Ht 67.0 in | Wt 201.0 lb

## 2020-07-10 DIAGNOSIS — M48061 Spinal stenosis, lumbar region without neurogenic claudication: Secondary | ICD-10-CM | POA: Diagnosis present

## 2020-07-10 DIAGNOSIS — M961 Postlaminectomy syndrome, not elsewhere classified: Secondary | ICD-10-CM | POA: Diagnosis not present

## 2020-07-10 DIAGNOSIS — M51379 Other intervertebral disc degeneration, lumbosacral region without mention of lumbar back pain or lower extremity pain: Secondary | ICD-10-CM

## 2020-07-10 DIAGNOSIS — M5442 Lumbago with sciatica, left side: Secondary | ICD-10-CM | POA: Insufficient documentation

## 2020-07-10 DIAGNOSIS — M5416 Radiculopathy, lumbar region: Secondary | ICD-10-CM | POA: Insufficient documentation

## 2020-07-10 DIAGNOSIS — M25552 Pain in left hip: Secondary | ICD-10-CM | POA: Diagnosis present

## 2020-07-10 DIAGNOSIS — M5441 Lumbago with sciatica, right side: Secondary | ICD-10-CM | POA: Insufficient documentation

## 2020-07-10 DIAGNOSIS — G894 Chronic pain syndrome: Secondary | ICD-10-CM | POA: Diagnosis present

## 2020-07-10 DIAGNOSIS — M25551 Pain in right hip: Secondary | ICD-10-CM | POA: Insufficient documentation

## 2020-07-10 DIAGNOSIS — M5137 Other intervertebral disc degeneration, lumbosacral region: Secondary | ICD-10-CM | POA: Diagnosis present

## 2020-07-10 DIAGNOSIS — M4310 Spondylolisthesis, site unspecified: Secondary | ICD-10-CM

## 2020-07-10 DIAGNOSIS — M48062 Spinal stenosis, lumbar region with neurogenic claudication: Secondary | ICD-10-CM

## 2020-07-10 DIAGNOSIS — G8929 Other chronic pain: Secondary | ICD-10-CM | POA: Diagnosis present

## 2020-07-10 MED ORDER — HYDROCODONE-ACETAMINOPHEN 10-325 MG PO TABS: 1.0000 | ORAL_TABLET | Freq: Two times a day (BID) | ORAL | 0 refills | Status: DC | PRN

## 2020-07-10 NOTE — Progress Notes (Signed)
Nursing Pain Medication Assessment:  Safety precautions to be maintained throughout the outpatient stay will include: orient to surroundings, keep bed in low position, maintain call bell within reach at all times, provide assistance with transfer out of bed and ambulation.  Medication Inspection Compliance: Pill count conducted under aseptic conditions, in front of the patient. Neither the pills nor the bottle was removed from the patient's sight at any time. Once count was completed pills were immediately returned to the patient in their original bottle.  Medication: Hydrocodone/APAP Pill/Patch Count: 0 of 45 pills remain Pill/Patch Appearance: Markings consistent with prescribed medication Bottle Appearance: Standard pharmacy container. Clearly labeled. Filled Date: 8 / 3 / 21 Last Medication intake:  9/3/21Safety precautions to be maintained throughout the outpatient stay will include: orient to surroundings, keep bed in low position, maintain call bell within reach at all times, provide assistance with transfer out of bed and ambulation.

## 2020-07-10 NOTE — Progress Notes (Signed)
PROVIDER NOTE: Information contained herein reflects review and annotations entered in association with encounter. Interpretation of such information and data should be left to medically-trained personnel. Information provided to patient can be located elsewhere in the medical record under "Patient Instructions". Document created using STT-dictation technology, any transcriptional errors that may result from process are unintentional.    Patient: Stephanie [name]  Service Category: E/M  Provider: Gaspar Cola, MD  DOB: [birth year]  DOS: 07/10/2020  Specialty: Interventional Pain Management  MRN: [id number]  Setting: Ambulatory outpatient  PCP: [facility]  Type: Established Patient    Referring Provider: [facility]  Location: Office  Delivery: Face-to-face     HPI  Reason for encounter: [name], a 59 y.o. year old female, is here today for evaluation and management of her Chronic bilateral low back pain with bilateral sciatica [M54.42, M54.41, G89.29]. [name] primary complain today is Back Pain Last encounter: Practice (07/09/2020). My last encounter with her was on 05/27/2020. Pertinent problems: 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); Musculoskeletal pain; Neurogenic pain; Spasm of paraspinal muscle; Chronic hip pain (Bilateral) (R>L); Osteoarthritis of knee (Bilateral); Osteoarthritis of hip (Bilateral); Osteoarthritis of sacroiliac joint  (Bilateral); Chronic sacroiliac joint pain (Bilateral); Chronic pain syndrome; Failed back surgical syndrome; Eczema of hands (Bilateral) (R>L); Abnormal MRI, knee (04/04/2018); 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 Pain Assessment: Severity of Chronic pain is reported as a 5 /10. Location: Back Lower/pain radiaties down both legto her feet. Onset: More than a month ago. Quality: Throbbing, Burning, Sharp, Constant. Timing: Constant. Modifying factor(s): meds, laying down. Vitals:  height is _0  (1.702 m) and weight is 201 lb (91.2 kg). Her temperature is 97.4 F (36.3 C) (abnormal). Her blood pressure is 126/96 (abnormal) and her pulse is 90. Her oxygen saturation is 100%.   The patient returns to the clinic today for follow-up evaluation and medication management. The patient indicates doing well with the current medication regimen. No adverse reactions or side effects reported to the medications.  She is currently UDS and PMP compliant.  The only time that we have done any type of interventional therapy for this patient was on 02/01/2017, which time she had a caudal epidural steroid injection and epidurogram with good results.  Since then, she has not taking advantage of any of our other interventional therapies and she continues on the opioid analgesics.  More recently she has had x-rays of the lumbar and cervical spine done which continue to show degenerative disc disease both of the cervical and lumbar spine regions.  She has a chronic pain history significant for a failed back surgical syndrome with epidural fibrosis.  She indicates having an increase in her low back pain and right lower extremity pain.  At this point, the  plan is to have the patient come back for a caudal epidural steroid  injection and diagnostic epidurogram #2 under fluoroscopic guidance and IV sedation.  Should the patient again get good short-term relief of the pain for the duration of the local anesthetic, but no long-term benefit, we will be scheduling her for a Racz procedure to extend down the benefit from the diagnostic injection.  When she comes in for her epidurogram, we will try to document the location of her scar tissue from the epidurogram.  Pharmacotherapy Assessment   Analgesic: Hydrocodone/acetaminophen 10/325 mg, 1 tab PO BID (20 mg/day of hydrocodone) MME/day:35m/day.   Monitoring: [place] PMP: PDMP not reviewed this encounter.       Pharmacotherapy: No side-effects or adverse reactions reported. Compliance: No problems identified. Effectiveness: Clinically acceptable.  [name]  07/10/2020  3:08 PM  Sign when Signing Visit Nursing Pain Medication Assessment:  Safety precautions to be maintained throughout the outpatient stay will include: orient to surroundings, keep bed in low position, maintain call bell within reach at all times, provide assistance with transfer out of bed and ambulation.  Medication Inspection Compliance: Pill count conducted under aseptic conditions, in front of the patient. Neither the pills nor the bottle was removed from the patient's sight at any time. Once count was completed pills were immediately returned to the patient in their original bottle.  Medication: Hydrocodone/APAP Pill/Patch Count: 0 of 45 pills remain Pill/Patch Appearance: Markings consistent with prescribed medication Bottle Appearance: Standard pharmacy container. Clearly labeled. Filled Date: 8 / 3 / 21 Last Medication intake:  9/3/21Safety precautions to be maintained throughout the outpatient stay will include: orient to surroundings, keep bed in low position, maintain call bell within reach at all times, provide  assistance with transfer out of bed and ambulation.     UDS:  Summary  Date Value Ref Range Status  02/27/2020 Note  Final    Comment:    ==================================================================== [employer] [facility] 13 (MW) ==================================================================== Test                             Result       Flag       Units Drug Present and Declared for Prescription Verification   Hydrocodone                    1404         EXPECTED   ng/mg creat   Hydromorphone                  241          EXPECTED   ng/mg creat   Dihydrocodeine                 99           EXPECTED   ng/mg creat   Norhydrocodone                 >1866        EXPECTED   ng/mg creat    Sources of hydrocodone include scheduled prescription medications.    Hydromorphone, dihydrocodeine and norhydrocodone are expected    metabolites of hydrocodone. Hydromorphone and dihydrocodeine are    also available as scheduled prescription medications. ==================================================================== Test                      Result    Flag   Units      Ref Range   Creatinine  268              mg/dL      >=20 ==================================================================== Declared Medications:  The flagging and interpretation on this report are based on the  following declared medications.  Unexpected results may arise from  inaccuracies in the declared medications.  **Note: The testing scope of this panel includes these medications:  Hydrocodone  **Note: The testing scope of this panel does not include the  following reported medications:  Acetaminophen  Cyclobenzaprine  Gabapentin  Melatonin  Meloxicam  Triamcinolone (Kenalog) ==================================================================== For clinical consultation, please call [phone]. ====================================================================      ROS  Constitutional:  Denies any fever or chills Gastrointestinal: No reported hemesis, hematochezia, vomiting, or acute GI distress Musculoskeletal: Denies any acute onset joint swelling, redness, loss of ROM, or weakness Neurological: No reported episodes of acute onset apraxia, aphasia, dysarthria, agnosia, amnesia, paralysis, loss of coordination, or loss of consciousness  Medication Review  HYDROcodone-acetaminophen, Melatonin, cyclobenzaprine, gabapentin, meloxicam, and triamcinolone ointment  History Review  Allergy: [name] is allergic to phenergan [promethazine hcl]. Drug: [name]  reports no history of drug use. Alcohol:  reports no history of alcohol use. Tobacco:  reports that she has never smoked. She has never used smokeless tobacco. Social: [name]  reports that she has never smoked. She has never used smokeless tobacco. She reports that she does not drink alcohol and does not use drugs. Medical:  has a past medical history of Allergy, Anemia, iron deficiency (04/09/2014), Arthritis, Atypical chest pain (04/09/2014), CD (contact dermatitis) (03/01/2014), Chest pain (04/09/2014), and Lumbar radicular pain (08/22/2015). Surgical: [name]  has a past surgical history that includes Back surgery; Appendectomy; Cholecystectomy; Cesarean section; and Bunionectomy. Family: family history includes Asthma in her father; Stroke in her mother.  Laboratory Chemistry Profile   Renal Lab Results  Component Value Date   BUN 13 09/01/2019   CREATININE 0.44 09/01/2019   GFRAA >60 09/01/2019   GFRNONAA >60 09/01/2019     Hepatic Lab Results  Component Value Date   AST 15 09/01/2019   ALT 13 09/01/2019   ALBUMIN 4.2 09/01/2019   ALKPHOS 61 09/01/2019     Electrolytes Lab Results  Component Value Date   NA 140 09/01/2019   K 4.0 09/01/2019   CL 107 09/01/2019   CALCIUM 9.3 09/01/2019   MG 1.9 09/01/2019     Bone Lab Results  Component Value Date   VD25OH 33.18 09/01/2019   25OHVITD1 34  05/07/2016   25OHVITD2 <1.0 05/07/2016   25OHVITD3 34 05/07/2016     Inflammation (CRP: Acute Phase) (ESR: Chronic Phase) Lab Results  Component Value Date   CRP <0.8 09/01/2019   ESRSEDRATE 18 09/01/2019       Note: Above Lab results reviewed.  Recent Imaging Review  DG Lumbar Spine Complete w/ Flex/Ext (6 Views) CLINICAL DATA:  Chronic low back pain radiating into the right leg. Symptoms have worsened over the past 3 weeks. No known injury. History of prior lumbar surgery.  EXAM: LUMBAR SPINE - COMPLETE WITH BENDING VIEWS  COMPARISON:  Plain films lumbar spine 04/15/2016.  FINDINGS: Vertebral body height is maintained. 0.5 cm anterolisthesis L4 on L5 is unchanged. There is approximately 1.4 cm anterolisthesis L5 on S1. The patient is status post L5-S1 fusion. Hardware is intact. Mild lucency about the left S1 screw is unchanged. Mild loss of disc space height at L4-5 is also unchanged. No pathologic motion is seen with flexion or extension. Paraspinous structures  are unremarkable.  IMPRESSION: No new abnormality compared to the prior examination.  Degenerative disease most notable at L4-5 where there is some loss of disc space height and 0.5 cm anterolisthesis due to facet degenerative disease.  Status post L5-S1 fusion.  Electronically Signed   By: Inge Rise M.D.   On: 10/06/2019 14:12 DG Cervical Spine With Flex & Extend CLINICAL DATA:  Neck pain.  No known injury.  EXAM: CERVICAL SPINE COMPLETE WITH FLEXION AND EXTENSION VIEWS  COMPARISON:  None.  FINDINGS: Vertebral body height and alignment are maintained. There is mild loss of disc space height and endplate spurring at K7-4. No pathologic motion is seen with flexion or extension. Neural foramina appear open. Prevertebral soft tissues are normal. Lung apices are clear.  IMPRESSION: Mild appearing degenerative disc disease C5-6. The exam is otherwise negative.  Electronically Signed   By:  Inge Rise M.D.   On: 10/06/2019 14:04 Note: Reviewed        Physical Exam  General appearance: Well nourished, well developed, and well hydrated. In no apparent acute distress Mental status: Alert, oriented x 3 (person, place, & time)       Respiratory: No evidence of acute respiratory distress Eyes: PERLA Vitals: BP (!) 126/96   Pulse 90   Temp (!) 97.4 F (36.3 C)   Ht _0  (1.702 m)   Wt 201 lb (91.2 kg)   LMP  (LMP Unknown)   SpO2 100%   BMI 31.48 kg/m  BMI: Estimated body mass index is 31.48 kg/m as calculated from the following:   Height as of this encounter: _1  (1.702 m).   Weight as of this encounter: 201 lb (91.2 kg). Ideal: Ideal body weight: 61.6 kg (135 lb 12.9 oz) Adjusted ideal body weight: 73.4 kg (161 lb 14.1 oz)  Assessment   Status Diagnosis  Controlled Controlled Controlled 1. Chronic low back pain (Primary area of Pain) (Bilateral) (L>R)   2. Chronic lumbar radicular pain (Right) (S1)   3. Failed back surgical syndrome   4. DDD (degenerative disc disease), lumbosacral   5. 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 Chronic pain of both hips   9. Chronic pain syndrome      Updated Problems: No problems updated.  Plan of Care  Problem-specific:  No problem-specific Assessment & Plan notes found for this encounter.  [name] has a current medication list which includes the following long-term medication(s): cyclobenzaprine, gabapentin, melatonin, meloxicam, hydrocodone-acetaminophen, [START ON 08/09/2020] hydrocodone-acetaminophen, [START ON 09/08/2020] hydrocodone-acetaminophen, and triamcinolone ointment.  Pharmacotherapy (Medications Ordered): Meds ordered this encounter  Medications  . HYDROcodone-acetaminophen (NORCO) 10-325 MG tablet    Sig: Take 1 tablet by mouth every 12 (twelve)  hours as needed for severe pain. Must last 30 days    Dispense:  45 tablet    Refill:  0    Chronic Pain: STOP Act (Not applicable) Fill 1 day early if closed on refill date. Avoid benzodiazepines within 8 hours of opioids  . HYDROcodone-acetaminophen (NORCO) 10-325 MG tablet    Sig: Take 1 tablet by mouth every 12 (twelve) hours as needed for severe pain. Must last 30 days    Dispense:  45 tablet    Refill:  0    Chronic Pain: STOP Act (Not applicable) Fill 1 day early if closed on refill date.  Avoid benzodiazepines within 8 hours of opioids  . HYDROcodone-acetaminophen (NORCO) 10-325 MG tablet    Sig: Take 1 tablet by mouth every 12 (twelve) hours as needed for severe pain. Must last 30 days    Dispense:  45 tablet    Refill:  0    Chronic Pain: STOP Act (Not applicable) Fill 1 day early if closed on refill date. Avoid benzodiazepines within 8 hours of opioids   Orders:  Orders Placed This Encounter  Procedures  . Caudal Epidural Injection    Standing Status:   Future    Standing Expiration Date:   08/09/2020    Scheduling Instructions:     Laterality: Right-sided     Level(s): Sacrococcygeal canal (Tailbone area)     Sedation: With Sedation     Scheduling Timeframe: As soon as pre-approved    Order Specific Question:   Where will this procedure be performed?    Answer:   [facility]   Follow-up plan:   Return for Procedure (w/ sedation): (R) Caudal ESI #1 + Dx Epidurogram w/ Dr. Holley Raring.      Interventional therapies: Planned, scheduled, and/or pending:   Diagnostic right-sided caudal ESI #2 under fluoroscopic guidance and IV sedation    Considering:   Diagnostic bilateral lumbar facet block Possible bilateral lumbar facet RFA Diagnostic Caudal ESI Diagnostic Bilateral L4-5 & L5-S1Lumbar TFESI Diagnostic bilateral sacroiliac joint block Possible bilateral sacroiliac joint RFA Diagnostic bilateral hip joint injection Possible bilateral hip joint RFA Diagnostic  bilateral knee joint injection Possible series of Hyalgan knee injections Diagnostic bilateral genicular nerve block Possible bilateral genicular nerve RFA.  Possible Racz procedure    Palliative PRN treatment(s):   Palliative Caudal ESI #2 (Last done 02/01/2017) (100/100/80/80)     Recent Visits Date Type Provider Dept  07/10/20 Office Visit Milinda Pointer, MD [facility]  05/27/20 Office Visit Milinda Pointer, MD [facility]  Showing recent visits within past 90 days and meeting all other requirements Future Appointments Date Type Provider Dept  10/07/20 Appointment Milinda Pointer, MD [facility]  Showing future appointments within next 90 days and meeting all other requirements  I discussed the assessment and treatment plan with the patient. The patient was provided an opportunity to ask questions and all were answered. The patient agreed with the plan and demonstrated an understanding of the instructions.  Patient advised to call back or seek an in-person evaluation if the symptoms or condition worsens.  Duration of encounter: 30 minutes.  Note by: Gaspar Cola, MD Date: 07/10/2020; Time: 7:02 AM

## 2020-07-10 NOTE — Patient Instructions (Signed)
____________________________________________________________________________________________  Preparing for Procedure with Sedation  Procedure appointments are limited to planned procedures: . No Prescription Refills. . No disability issues will be discussed. . No medication changes will be discussed.  Instructions: . Oral Intake: Do not eat or drink anything for at least 8 hours prior to your procedure. (Exception: Blood Pressure Medication. See below.) . Transportation: Unless otherwise stated by your physician, you may drive yourself after the procedure. . Blood Pressure Medicine: Do not forget to take your blood pressure medicine with a sip of water the morning of the procedure. If your Diastolic (lower reading)is above 100 mmHg, elective cases will be cancelled/rescheduled. . Blood thinners: These will need to be stopped for procedures. Notify our staff if you are taking any blood thinners. Depending on which one you take, there will be specific instructions on how and when to stop it. . Diabetics on insulin: Notify the staff so that you can be scheduled 1st case in the morning. If your diabetes requires high dose insulin, take only  of your normal insulin dose the morning of the procedure and notify the staff that you have done so. . Preventing infections: Shower with an antibacterial soap the morning of your procedure. . Build-up your immune system: Take 1000 mg of Vitamin C with every meal (3 times a day) the day prior to your procedure. . Antibiotics: Inform the staff if you have a condition or reason that requires you to take antibiotics before dental procedures. . Pregnancy: If you are pregnant, call and cancel the procedure. . Sickness: If you have a cold, fever, or any active infections, call and cancel the procedure. . Arrival: You must be in the facility at least 30 minutes prior to your scheduled procedure. . Children: Do not bring children with you. . Dress appropriately:  Bring dark clothing that you would not mind if they get stained. . Valuables: Do not bring any jewelry or valuables.  Reasons to call and reschedule or cancel your procedure: (Following these recommendations will minimize the risk of a serious complication.) . Surgeries: Avoid having procedures within 2 weeks of any surgery. (Avoid for 2 weeks before or after any surgery). . Flu Shots: Avoid having procedures within 2 weeks of a flu shots or . (Avoid for 2 weeks before or after immunizations). . Barium: Avoid having a procedure within 7-10 days after having had a radiological study involving the use of radiological contrast. (Myelograms, Barium swallow or enema study). . Heart attacks: Avoid any elective procedures or surgeries for the initial 6 months after a "Myocardial Infarction" (Heart Attack). . Blood thinners: It is imperative that you stop these medications before procedures. Let us know if you if you take any blood thinner.  . Infection: Avoid procedures during or within two weeks of an infection (including chest colds or gastrointestinal problems). Symptoms associated with infections include: Localized redness, fever, chills, night sweats or profuse sweating, burning sensation when voiding, cough, congestion, stuffiness, runny nose, sore throat, diarrhea, nausea, vomiting, cold or Flu symptoms, recent or current infections. It is specially important if the infection is over the area that we intend to treat. . Heart and lung problems: Symptoms that may suggest an active cardiopulmonary problem include: cough, chest pain, breathing difficulties or shortness of breath, dizziness, ankle swelling, uncontrolled high or unusually low blood pressure, and/or palpitations. If you are experiencing any of these symptoms, cancel your procedure and contact your primary care physician for an evaluation.  Remember:  Regular Business hours are:    Monday to Thursday 8:00 AM to 4:00 PM  Provider's  Schedule: Jerusha Reising, MD:  Procedure days: Tuesday and Thursday 7:30 AM to 4:00 PM  Bilal Lateef, MD:  Procedure days: Monday and Wednesday 7:30 AM to 4:00 PM ____________________________________________________________________________________________    

## 2020-07-16 DIAGNOSIS — M172 Bilateral post-traumatic osteoarthritis of knee: Secondary | ICD-10-CM | POA: Diagnosis not present

## 2020-07-22 DIAGNOSIS — M172 Bilateral post-traumatic osteoarthritis of knee: Secondary | ICD-10-CM | POA: Diagnosis not present

## 2020-08-12 DIAGNOSIS — M172 Bilateral post-traumatic osteoarthritis of knee: Secondary | ICD-10-CM | POA: Diagnosis not present

## 2020-09-02 ENCOUNTER — Other Ambulatory Visit: Payer: Self-pay | Admitting: Gerontology

## 2020-09-02 DIAGNOSIS — R7303 Prediabetes: Secondary | ICD-10-CM | POA: Diagnosis not present

## 2020-09-02 DIAGNOSIS — F32A Depression, unspecified: Secondary | ICD-10-CM | POA: Diagnosis not present

## 2020-09-02 DIAGNOSIS — G8929 Other chronic pain: Secondary | ICD-10-CM | POA: Diagnosis not present

## 2020-09-02 DIAGNOSIS — G894 Chronic pain syndrome: Secondary | ICD-10-CM | POA: Diagnosis not present

## 2020-09-02 DIAGNOSIS — Z1321 Encounter for screening for nutritional disorder: Secondary | ICD-10-CM | POA: Diagnosis not present

## 2020-09-02 DIAGNOSIS — G4701 Insomnia due to medical condition: Secondary | ICD-10-CM | POA: Diagnosis not present

## 2020-09-02 DIAGNOSIS — D508 Other iron deficiency anemias: Secondary | ICD-10-CM | POA: Diagnosis not present

## 2020-09-02 DIAGNOSIS — Z13220 Encounter for screening for lipoid disorders: Secondary | ICD-10-CM | POA: Diagnosis not present

## 2020-09-02 DIAGNOSIS — Z1231 Encounter for screening mammogram for malignant neoplasm of breast: Secondary | ICD-10-CM

## 2020-09-02 DIAGNOSIS — Z0000 Encounter for general adult medical examination without abnormal findings: Secondary | ICD-10-CM | POA: Diagnosis not present

## 2020-09-04 DIAGNOSIS — E119 Type 2 diabetes mellitus without complications: Secondary | ICD-10-CM | POA: Insufficient documentation

## 2020-10-01 DIAGNOSIS — E119 Type 2 diabetes mellitus without complications: Secondary | ICD-10-CM | POA: Diagnosis not present

## 2020-10-01 DIAGNOSIS — Z23 Encounter for immunization: Secondary | ICD-10-CM | POA: Diagnosis not present

## 2020-10-01 DIAGNOSIS — F32A Depression, unspecified: Secondary | ICD-10-CM | POA: Diagnosis not present

## 2020-10-06 NOTE — Progress Notes (Signed)
PROVIDER NOTE: Information contained herein reflects review and annotations entered in association with encounter. Interpretation of such information and data should be left to medically-trained personnel. Information provided to patient can be located elsewhere in the medical record under "Patient Instructions". Document created using STT-dictation technology, any transcriptional errors that may result from process are unintentional.    Patient: Stephanie [name]  Service Category: E/M  Provider: Gaspar Cola, MD  DOB: 06-09-1961  DOS: 10/07/2020  Specialty: Interventional Pain Management  MRN: [id number]  Setting: Ambulatory outpatient  PCP: Toni Arthurs, NP  Type: Established Patient    Referring Provider: Langley Gauss Primary Ca*  Location: Office  Delivery: Face-to-face     HPI  Stephanie [name], a 59 y.o. year old female, is here today because of her Chronic pain syndrome [G89.4]. Ms. Mcbean primary complain today is Back Pain Last encounter: My last encounter with her was on 07/10/2020. Pertinent problems: Ms. 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); Musculoskeletal pain; Neurogenic pain; Spasm of paraspinal muscle; Chronic hip pain (Bilateral) (R>L); Osteoarthritis of knee (Bilateral); Osteoarthritis of hip (Bilateral); Osteoarthritis of sacroiliac joint (Bilateral); Chronic sacroiliac joint pain (Bilateral); Chronic pain syndrome; Failed back surgical syndrome; Eczema of  hands (Bilateral) (R>L); Abnormal MRI, knee (04/04/2018); 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 Pain Assessment: Severity of Chronic pain is reported as a 5 /10. Location: Back Lower/Pain radiaties down her leg to her toes. Onset: More than a month ago. Quality: Burning, Sharp. Timing: Constant. Modifying factor(s): meds and heating pad. Vitals:  height is 5' 7"  (1.702 m) and weight is 195 lb (88.5 kg). Her temperature is 97.6 F (36.4 C). Her blood pressure is 133/80 and her pulse is 85. Her oxygen saturation is 98%.   Reason for encounter: medication management.  The patient indicates doing well with the current medication regimen. No adverse reactions or side effects reported to the medications. PMP compliant.   RTCB: 01/06/2021 Nonopioids transferred 10/07/2020: Mobic, Neurontin, Flexeril, melatonin, and triamcinolone ointment.  Pharmacotherapy Assessment   Analgesic: Hydrocodone/acetaminophen 10/325 mg, 1 tab PO BID (20 mg/day of hydrocodone) MME/day:27m/day.   Monitoring: [place] PMP: PDMP reviewed during this encounter.       Pharmacotherapy: No side-effects or adverse reactions reported. Compliance: No problems identified. Effectiveness: Clinically acceptable.  [name]  10/07/2020  8:20 AM  Sign when Signing Visit Nursing Pain Medication Assessment:  Safety precautions to be maintained throughout the outpatient stay will include: orient to surroundings, keep bed in low position, maintain call bell within reach at all times, provide assistance with transfer out of bed and ambulation.  Medication Inspection Compliance: Pill count conducted under aseptic conditions, in front of the patient. Neither the  pills nor the bottle was removed from the patient's sight at any time. Once count was completed pills were immediately returned to the patient in their original bottle.  Medication: Hydrocodone/APAP  Pill/Patch Count: 4 of 45 pills remain Pill/Patch Appearance: Markings consistent with prescribed medication Bottle Appearance: Standard pharmacy container. Clearly labeled. Filled Date: 55 / 8 / 21 Last Medication intake:  TodaySafety precautions to be maintained throughout the outpatient stay will include: orient to surroundings, keep bed in low position, maintain call bell within reach at all times, provide assistance with transfer out of bed and ambulation.     UDS:  Summary  Date Value Ref Range Status  02/27/2020 Note  Final    Comment:    ==================================================================== [employer] Select 13 (MW) ==================================================================== Test                             Result       Flag       Units Drug Present and Declared for Prescription Verification   Hydrocodone                    1404         EXPECTED   ng/mg creat   Hydromorphone                  241          EXPECTED   ng/mg creat   Dihydrocodeine                 99           EXPECTED   ng/mg creat   Norhydrocodone                 >1866        EXPECTED   ng/mg creat    Sources of hydrocodone include scheduled prescription medications.    Hydromorphone, dihydrocodeine and norhydrocodone are expected    metabolites of hydrocodone. Hydromorphone and dihydrocodeine are    also available as scheduled prescription medications. ==================================================================== Test                      Result    Flag   Units      Ref Range   Creatinine              268              mg/dL      >=20 ==================================================================== Declared Medications:  The flagging and interpretation on this report are based on  the  following declared medications.  Unexpected results may arise from  inaccuracies in the declared medications.  **Note: The testing scope of this panel includes these medications:  Hydrocodone  **Note: The testing scope of this panel does not include the  following reported medications:  Acetaminophen  Cyclobenzaprine  Gabapentin  Melatonin  Meloxicam  Triamcinolone (Kenalog) ==================================================================== For clinical consultation, please call [phone]. ====================================================================      ROS  Constitutional: Denies any fever or chills Gastrointestinal: No reported hemesis, hematochezia, vomiting, or acute GI distress Musculoskeletal: Denies any acute onset joint swelling, redness, loss of ROM, or weakness Neurological: No reported episodes of acute onset apraxia, aphasia, dysarthria, agnosia, amnesia, paralysis, loss of coordination, or loss of consciousness  Medication Review  HYDROcodone-acetaminophen, Melatonin, cyclobenzaprine, gabapentin, meloxicam, and triamcinolone ointment  History Review  Allergy: Ms. Matsuoka is allergic to phenergan [promethazine hcl]. Drug: Ms. Creer  reports no history of drug use. Alcohol:  reports no history of alcohol use. Tobacco:  reports that she has never smoked. She has never used smokeless tobacco. Social: Ms. Boback  reports that she has never smoked. She has never used smokeless tobacco. She reports that she does not drink alcohol and does not use drugs. Medical:  has a past medical history of Allergy, Anemia, iron deficiency (04/09/2014), Arthritis, Atypical chest pain (04/09/2014), CD (contact dermatitis) (03/01/2014), Chest pain (04/09/2014), and Lumbar radicular pain (08/22/2015). Surgical: Ms. Nodal  has a past surgical history that includes Back surgery; Appendectomy; Cholecystectomy; Cesarean section; and Bunionectomy. Family: family history includes  Asthma in her father; Stroke in her mother.  Laboratory Chemistry Profile   Renal Lab Results  Component Value Date   BUN 13 09/01/2019   CREATININE 0.44 09/01/2019   GFRAA >60 09/01/2019   GFRNONAA >60 09/01/2019     Hepatic Lab Results  Component Value Date   AST 15 09/01/2019   ALT 13 09/01/2019   ALBUMIN 4.2 09/01/2019   ALKPHOS 61 09/01/2019     Electrolytes Lab Results  Component Value Date   NA 140 09/01/2019   K 4.0 09/01/2019   CL 107 09/01/2019   CALCIUM 9.3 09/01/2019   MG 1.9 09/01/2019     Bone Lab Results  Component Value Date   VD25OH 33.18 09/01/2019   25OHVITD1 34 05/07/2016   25OHVITD2 <1.0 05/07/2016   25OHVITD3 34 05/07/2016     Inflammation (CRP: Acute Phase) (ESR: Chronic Phase) Lab Results  Component Value Date   CRP <0.8 09/01/2019   ESRSEDRATE 18 09/01/2019       Note: Above Lab results reviewed.  Recent Imaging Review  DG Lumbar Spine Complete w/ Flex/Ext (6 Views) CLINICAL DATA:  Chronic low back pain radiating into the right leg. Symptoms have worsened over the past 3 weeks. No known injury. History of prior lumbar surgery.  EXAM: LUMBAR SPINE - COMPLETE WITH BENDING VIEWS  COMPARISON:  Plain films lumbar spine 04/15/2016.  FINDINGS: Vertebral body height is maintained. 0.5 cm anterolisthesis L4 on L5 is unchanged. There is approximately 1.4 cm anterolisthesis L5 on S1. The patient is status post L5-S1 fusion. Hardware is intact. Mild lucency about the left S1 screw is unchanged. Mild loss of disc space height at L4-5 is also unchanged. No pathologic motion is seen with flexion or extension. Paraspinous structures are unremarkable.  IMPRESSION: No new abnormality compared to the prior examination.  Degenerative disease most notable at L4-5 where there is some loss of disc space height and 0.5 cm anterolisthesis due to facet degenerative disease.  Status post L5-S1 fusion.  Electronically Signed   By: Inge Rise M.D.   On: 10/06/2019 14:12 DG Cervical Spine With Flex & Extend CLINICAL DATA:  Neck pain.  No known injury.  EXAM: CERVICAL SPINE COMPLETE WITH FLEXION AND EXTENSION VIEWS  COMPARISON:  None.  FINDINGS: Vertebral body height and alignment are maintained. There is mild loss of disc space height and endplate spurring at A5-7. No pathologic motion is seen with flexion or extension. Neural foramina appear open. Prevertebral soft tissues are normal. Lung apices are clear.  IMPRESSION: Mild appearing degenerative disc disease C5-6. The exam is otherwise negative.  Electronically Signed   By: Inge Rise M.D.   On: 10/06/2019 14:04 Note: Reviewed        Physical Exam  General appearance: Well nourished, well developed, and well hydrated. In no apparent acute distress Mental status: Alert, oriented x 3 (person, place, & time)       Respiratory: No evidence of acute respiratory distress Eyes: PERLA Vitals: BP 133/80   Pulse 85   Temp  97.6 F (36.4 C)   Ht 5' 7"  (1.702 m)   Wt 195 lb (88.5 kg)   LMP  (LMP Unknown)   SpO2 98%   BMI 30.54 kg/m  BMI: Estimated body mass index is 30.54 kg/m as calculated from the following:   Height as of this encounter: 5' 7"  (1.702 m).   Weight as of this encounter: 195 lb (88.5 kg). Ideal: Ideal body weight: 61.6 kg (135 lb 12.9 oz) Adjusted ideal body weight: 72.3 kg (159 lb 7.7 oz)  Assessment   Status Diagnosis  Controlled Controlled Controlled 1. Chronic pain syndrome   2. Chronic low back pain (Primary area of Pain) (Bilateral) (L>R)   3. Chronic lumbar radicular pain (Right) (S1)   4. Failed back surgical syndrome   5. Chronic pain of both hips   6. Chronic lower extremity pain (Secondary Area of Pain) (Bilateral) (L>R)   7. Chronic knee pain (Tertiary Area of Pain) (Bilateral) (R>L)   8. Eczema of hands (Bilateral) (R>L)   9. Insomnia secondary to chronic pain   10. Musculoskeletal pain   11. Spasm of  paraspinal muscle   12. Neurogenic pain   13. Spondylolisthesis of lumbar region      Updated Problems: No problems updated.  Plan of Care  Problem-specific:  No problem-specific Assessment & Plan notes found for this encounter.  Ms. Atisha Hamidi has a current medication list which includes the following long-term medication(s): cyclobenzaprine, gabapentin, [START ON 10/08/2020] hydrocodone-acetaminophen, [START ON 11/07/2020] hydrocodone-acetaminophen, [START ON 12/07/2020] hydrocodone-acetaminophen, melatonin, meloxicam, and triamcinolone ointment.  Pharmacotherapy (Medications Ordered): Meds ordered this encounter  Medications  . triamcinolone ointment (KENALOG) 0.1 %    Sig: Apply topically 3 (three) times daily.    Dispense:  80 g    Refill:  2    Fill one day early if pharmacy is closed on scheduled refill date. May substitute for generic if available.  . Melatonin ([facility] MELATONIN) 10 MG CAPS    Sig: Take 10-20 mg by mouth at bedtime.    Dispense:  60 capsule    Refill:  2    Fill one day early if pharmacy is closed on scheduled refill date. May substitute for generic if available.  . cyclobenzaprine (FLEXERIL) 10 MG tablet    Sig: Take 1 tablet (10 mg total) by mouth daily.    Dispense:  30 tablet    Refill:  2    Fill one day early if pharmacy is closed on scheduled refill date. May substitute for generic if available.  . gabapentin (NEURONTIN) 800 MG tablet    Sig: Take 1 tablet (800 mg total) by mouth 3 (three) times daily.    Dispense:  90 tablet    Refill:  2    Fill one day early if pharmacy is closed on scheduled refill date. May substitute for generic if available.  . meloxicam (MOBIC) 15 MG tablet    Sig: Take 1 tablet (15 mg total) by mouth every morning.    Dispense:  30 tablet    Refill:  2    Fill one day early if pharmacy is closed on scheduled refill date. May substitute for generic if available.  Marland Kitchen HYDROcodone-acetaminophen (NORCO) 10-325 MG tablet     Sig: Take 1 tablet by mouth every 12 (twelve) hours as needed for severe pain. Must last 30 days    Dispense:  45 tablet    Refill:  0    Chronic Pain: STOP Act (Not applicable)  Fill 1 day early if closed on refill date. Avoid benzodiazepines within 8 hours of opioids  . HYDROcodone-acetaminophen (NORCO) 10-325 MG tablet    Sig: Take 1 tablet by mouth every 12 (twelve) hours as needed for severe pain. Must last 30 days    Dispense:  45 tablet    Refill:  0    Chronic Pain: STOP Act (Not applicable) Fill 1 day early if closed on refill date. Avoid benzodiazepines within 8 hours of opioids  . HYDROcodone-acetaminophen (NORCO) 10-325 MG tablet    Sig: Take 1 tablet by mouth every 12 (twelve) hours as needed for severe pain. Must last 30 days    Dispense:  45 tablet    Refill:  0    Chronic Pain: STOP Act (Not applicable) Fill 1 day early if closed on refill date. Avoid benzodiazepines within 8 hours of opioids   Orders:  No orders of the defined types were placed in this encounter.  Follow-up plan:   Return in about 13 weeks (around 01/06/2021) for (F2F), (Med Mgmt).      Interventional therapies: Planned, scheduled, and/or pending:   Diagnostic right-sided caudal ESI #2 under fluoroscopic guidance and IV sedation    Considering:   Diagnostic bilateral lumbar facet block Possible bilateral lumbar facet RFA Diagnostic Caudal ESI Diagnostic Bilateral L4-5 & L5-S1Lumbar TFESI Diagnostic bilateral sacroiliac joint block Possible bilateral sacroiliac joint RFA Diagnostic bilateral hip joint injection Possible bilateral hip joint RFA Diagnostic bilateral knee joint injection Possible series of Hyalgan knee injections Diagnostic bilateral genicular nerve block Possible bilateral genicular nerve RFA.  Possible Racz procedure    Palliative PRN treatment(s):   Palliative Caudal ESI #2 (Last done 02/01/2017) (100/100/80/80)      Recent Visits Date Type Provider Dept  07/10/20 Office  Visit Milinda Pointer, MD [facility]  Showing recent visits within past 90 days and meeting all other requirements Today's Visits Date Type Provider Dept  10/07/20 Office Visit Milinda Pointer, MD [facility]  Showing today's visits and meeting all other requirements Future Appointments No visits were found meeting these conditions. Showing future appointments within next 90 days and meeting all other requirements  I discussed the assessment and treatment plan with the patient. The patient was provided an opportunity to ask questions and all were answered. The patient agreed with the plan and demonstrated an understanding of the instructions.  Patient advised to call back or seek an in-person evaluation if the symptoms or condition worsens.  Duration of encounter: 30 minutes.  Note by: Gaspar Cola, MD Date: 10/07/2020; Time: 8:33 AM

## 2020-10-07 ENCOUNTER — Other Ambulatory Visit: Payer: Self-pay

## 2020-10-07 ENCOUNTER — Ambulatory Visit: Payer: No Typology Code available for payment source | Attending: Pain Medicine | Admitting: Pain Medicine

## 2020-10-07 ENCOUNTER — Encounter: Payer: Self-pay | Admitting: Pain Medicine

## 2020-10-07 VITALS — BP 133/80 | HR 85 | Temp 97.6°F | Ht 67.0 in | Wt 195.0 lb

## 2020-10-07 DIAGNOSIS — G894 Chronic pain syndrome: Secondary | ICD-10-CM | POA: Insufficient documentation

## 2020-10-07 DIAGNOSIS — M79604 Pain in right leg: Secondary | ICD-10-CM | POA: Diagnosis present

## 2020-10-07 DIAGNOSIS — M62830 Muscle spasm of back: Secondary | ICD-10-CM | POA: Diagnosis present

## 2020-10-07 DIAGNOSIS — L309 Dermatitis, unspecified: Secondary | ICD-10-CM | POA: Diagnosis present

## 2020-10-07 DIAGNOSIS — M7918 Myalgia, other site: Secondary | ICD-10-CM | POA: Insufficient documentation

## 2020-10-07 DIAGNOSIS — M79605 Pain in left leg: Secondary | ICD-10-CM | POA: Insufficient documentation

## 2020-10-07 DIAGNOSIS — M25562 Pain in left knee: Secondary | ICD-10-CM | POA: Diagnosis present

## 2020-10-07 DIAGNOSIS — M25551 Pain in right hip: Secondary | ICD-10-CM | POA: Diagnosis present

## 2020-10-07 DIAGNOSIS — M4316 Spondylolisthesis, lumbar region: Secondary | ICD-10-CM | POA: Insufficient documentation

## 2020-10-07 DIAGNOSIS — G4701 Insomnia due to medical condition: Secondary | ICD-10-CM | POA: Diagnosis present

## 2020-10-07 DIAGNOSIS — M5442 Lumbago with sciatica, left side: Secondary | ICD-10-CM | POA: Diagnosis present

## 2020-10-07 DIAGNOSIS — G8929 Other chronic pain: Secondary | ICD-10-CM | POA: Insufficient documentation

## 2020-10-07 DIAGNOSIS — M792 Neuralgia and neuritis, unspecified: Secondary | ICD-10-CM | POA: Diagnosis present

## 2020-10-07 DIAGNOSIS — M5441 Lumbago with sciatica, right side: Secondary | ICD-10-CM | POA: Diagnosis present

## 2020-10-07 DIAGNOSIS — M961 Postlaminectomy syndrome, not elsewhere classified: Secondary | ICD-10-CM | POA: Diagnosis not present

## 2020-10-07 DIAGNOSIS — M25561 Pain in right knee: Secondary | ICD-10-CM | POA: Diagnosis present

## 2020-10-07 DIAGNOSIS — M5416 Radiculopathy, lumbar region: Secondary | ICD-10-CM | POA: Diagnosis present

## 2020-10-07 DIAGNOSIS — M25552 Pain in left hip: Secondary | ICD-10-CM | POA: Insufficient documentation

## 2020-10-07 MED ORDER — TRIAMCINOLONE ACETONIDE 0.1 % EX OINT: TOPICAL_OINTMENT | Freq: Three times a day (TID) | CUTANEOUS | 2 refills | Status: DC

## 2020-10-07 MED ORDER — CYCLOBENZAPRINE HCL 10 MG PO TABS: 10.0000 mg | ORAL_TABLET | Freq: Every day | ORAL | 2 refills | Status: DC

## 2020-10-07 MED ORDER — MELOXICAM 15 MG PO TABS: 15.0000 mg | ORAL_TABLET | Freq: Every morning | ORAL | 2 refills | Status: AC

## 2020-10-07 MED ORDER — HYDROCODONE-ACETAMINOPHEN 10-325 MG PO TABS: 1.0000 | ORAL_TABLET | Freq: Two times a day (BID) | ORAL | 0 refills | Status: DC | PRN

## 2020-10-07 MED ORDER — MELATONIN 10 MG PO CAPS: 10.0000 mg | ORAL_CAPSULE | Freq: Every day | ORAL | 2 refills | Status: DC

## 2020-10-07 MED ORDER — GABAPENTIN 800 MG PO TABS: 800.0000 mg | ORAL_TABLET | Freq: Three times a day (TID) | ORAL | 2 refills | Status: DC

## 2020-10-07 NOTE — Patient Instructions (Signed)
____________________________________________________________________________________________  Medication Rules  Purpose: To inform patients, and their family members, of our rules and regulations.  Applies to: All patients receiving prescriptions (written or electronic).  Pharmacy of record: Pharmacy where electronic prescriptions will be sent. If written prescriptions are taken to a different pharmacy, please inform the nursing staff. The pharmacy listed in the electronic medical record should be the one where you would like electronic prescriptions to be sent.  Electronic prescriptions: In compliance with the [place] Strengthen Opioid Misuse Prevention (STOP) Act of 2017 (Session Law 2017-74/H243), effective November 02, 2018, all controlled substances must be electronically prescribed. Calling prescriptions to the pharmacy will cease to exist.  Prescription refills: Only during scheduled appointments. Applies to all prescriptions.  NOTE: The following applies primarily to controlled substances (Opioid* Pain Medications).   Type of encounter (visit): For patients receiving controlled substances, face-to-face visits are required. (Not an option or up to the patient.)  Patient's responsibilities: 1. Pain Pills: Bring all pain pills to every appointment (except for procedure appointments). 2. Pill Bottles: Bring pills in original pharmacy bottle. Always bring the newest bottle. Bring bottle, even if empty. 3. Medication refills: You are responsible for knowing and keeping track of what medications you take and those you need refilled. The day before your appointment: write a list of all prescriptions that need to be refilled. The day of the appointment: give the list to the admitting nurse. Prescriptions will be written only during appointments. No prescriptions will be written on procedure days. If you forget a medication: it will not be "Called in", "Faxed", or "electronically sent".  You will need to get another appointment to get these prescribed. No early refills. Do not call asking to have your prescription filled early. 4. Prescription Accuracy: You are responsible for carefully inspecting your prescriptions before leaving our office. Have the discharge nurse carefully go over each prescription with you, before taking them home. Make sure that your name is accurately spelled, that your address is correct. Check the name and dose of your medication to make sure it is accurate. Check the number of pills, and the written instructions to make sure they are clear and accurate. Make sure that you are given enough medication to last until your next medication refill appointment. 5. Taking Medication: Take medication as prescribed. When it comes to controlled substances, taking less pills or less frequently than prescribed is permitted and encouraged. Never take more pills than instructed. Never take medication more frequently than prescribed.  6. Inform other Doctors: Always inform, all of your healthcare providers, of all the medications you take. 7. Pain Medication from other Providers: You are not allowed to accept any additional pain medication from any other Doctor or Healthcare provider. There are two exceptions to this rule. (see below) In the event that you require additional pain medication, you are responsible for notifying us, as stated below. 8. Cough Medicine: Often these contain an opioid, such as codeine or hydrocodone. Never accept or take cough medicine containing these opioids if you are already taking an opioid* medication. The combination may cause respiratory failure and death. 9. Medication Agreement: You are responsible for carefully reading and following our Medication Agreement. This must be signed before receiving any prescriptions from our practice. Safely store a copy of your signed Agreement. Violations to the Agreement will result in no further prescriptions.  (Additional copies of our Medication Agreement are available upon request.) 10. Laws, Rules, & Regulations: All patients are expected to follow all   Federal and State Laws, Statutes, Rules, & Regulations. Ignorance of the Laws does not constitute a valid excuse.  11. Illegal drugs and Controlled Substances: The use of illegal substances (including, but not limited to marijuana and its derivatives) and/or the illegal use of any controlled substances is strictly prohibited. Violation of this rule may result in the immediate and permanent discontinuation of any and all prescriptions being written by our practice. The use of any illegal substances is prohibited. 12. Adopted CDC guidelines & recommendations: Target dosing levels will be at or below 60 MME/day. Use of benzodiazepines** is not recommended.  Exceptions: There are only two exceptions to the rule of not receiving pain medications from other Healthcare Providers. 1. Exception #1 (Emergencies): In the event of an emergency (i.e.: accident requiring emergency care), you are allowed to receive additional pain medication. However, you are responsible for: As soon as you are able, call our office [phone], at any time of the day or night, and leave a message stating your name, the date and nature of the emergency, and the name and dose of the medication prescribed. In the event that your call is answered by a member of our staff, make sure to document and save the date, time, and the name of the person that took your information.  2. Exception #2 (Planned Surgery): In the event that you are scheduled by another doctor or dentist to have any type of surgery or procedure, you are allowed (for a period no longer than 30 days), to receive additional pain medication, for the acute post-op pain. However, in this case, you are responsible for picking up a copy of our "Post-op Pain Management for Surgeons" handout, and giving it to your surgeon or dentist. This  document is available at our office, and does not require an appointment to obtain it. Simply go to our office during business hours (Monday-Thursday from 8:00 AM to 4:00 PM) (Friday 8:00 AM to 12:00 Noon) or if you have a scheduled appointment with us, prior to your surgery, and ask for it by name. In addition, you are responsible for: calling our office [phone], at any time of the day or night, and leaving a message stating your name, name of your surgeon, type of surgery, and date of procedure or surgery. Failure to comply with your responsibilities may result in termination of therapy involving the controlled substances.  *Opioid medications include: morphine, codeine, oxycodone, oxymorphone, hydrocodone, hydromorphone, meperidine, tramadol, tapentadol, buprenorphine, fentanyl, methadone. **Benzodiazepine medications include: diazepam (Valium), alprazolam (Xanax), clonazepam (Klonopine), lorazepam (Ativan), clorazepate (Tranxene), chlordiazepoxide (Librium), estazolam (Prosom), oxazepam (Serax), temazepam (Restoril), triazolam (Halcion) (Last updated: 09/30/2020) ____________________________________________________________________________________________   ____________________________________________________________________________________________  Medication Recommendations and Reminders  Applies to: All patients receiving prescriptions (written and/or electronic).  Medication Rules & Regulations: These rules and regulations exist for your safety and that of others. They are not flexible and neither are we. Dismissing or ignoring them will be considered "non-compliance" with medication therapy, resulting in complete and irreversible termination of such therapy. (See document titled "Medication Rules" for more details.) In all conscience, because of safety reasons, we cannot continue providing a therapy where the patient does not follow instructions.  Pharmacy of record:   Definition:  This is the pharmacy where your electronic prescriptions will be sent.   We do not endorse any particular pharmacy, however, we have experienced problems with [facility] not securing enough medication supply for the community.  We do not restrict you in your choice of pharmacy. However,   once we write for your prescriptions, we will NOT be re-sending more prescriptions to fix restricted supply problems created by your pharmacy, or your insurance.   The pharmacy listed in the electronic medical record should be the one where you want electronic prescriptions to be sent.  If you choose to change pharmacy, simply notify our nursing staff.  Recommendations:  Keep all of your pain medications in a safe place, under lock and key, even if you live alone. We will NOT replace lost, stolen, or damaged medication.  After you fill your prescription, take 1 week's worth of pills and put them away in a safe place. You should keep a separate, properly labeled bottle for this purpose. The remainder should be kept in the original bottle. Use this as your primary supply, until it runs out. Once it's gone, then you know that you have 1 week's worth of medicine, and it is time to come in for a prescription refill. If you do this correctly, it is unlikely that you will ever run out of medicine.  To make sure that the above recommendation works, it is very important that you make sure your medication refill appointments are scheduled at least 1 week before you run out of medicine. To do this in an effective manner, make sure that you do not leave the office without scheduling your next medication management appointment. Always ask the nursing staff to show you in your prescription , when your medication will be running out. Then arrange for the receptionist to get you a return appointment, at least 7 days before you run out of medicine. Do not wait until you have 1 or 2 pills left, to come in. This is very poor planning and  does not take into consideration that we may need to cancel appointments due to bad weather, sickness, or emergencies affecting our staff.  DO NOT ACCEPT A "Partial Fill": If for any reason your pharmacy does not have enough pills/tablets to completely fill or refill your prescription, do not allow for a "partial fill". The law allows the pharmacy to complete that prescription within 72 hours, without requiring a new prescription. If they do not fill the rest of your prescription within those 72 hours, you will need a separate prescription to fill the remaining amount, which we will NOT provide. If the reason for the partial fill is your insurance, you will need to talk to the pharmacist about payment alternatives for the remaining tablets, but again, DO NOT ACCEPT A PARTIAL FILL, unless you can trust your pharmacist to obtain the remainder of the pills within 72 hours.  Prescription refills and/or changes in medication(s):   Prescription refills, and/or changes in dose or medication, will be conducted only during scheduled medication management appointments. (Applies to both, written and electronic prescriptions.)  No refills on procedure days. No medication will be changed or started on procedure days. No changes, adjustments, and/or refills will be conducted on a procedure day. Doing so will interfere with the diagnostic portion of the procedure.  No phone refills. No medications will be "called into the pharmacy".  No Fax refills.  No weekend refills.  No Holliday refills.  No after hours refills.  Remember:  Business hours are:  Monday to Thursday 8:00 AM to 4:00 PM Provider's Schedule: Roneisha Stern, MD - Appointments are:  Medication management: Monday and Wednesday 8:00 AM to 4:00 PM Procedure day: Tuesday and Thursday 7:30 AM to 4:00 PM Bilal Lateef, MD - Appointments are:    Medication management: Tuesday and Thursday 8:00 AM to 4:00 PM Procedure day: Monday and Wednesday  7:30 AM to 4:00 PM (Last update: 05/22/2020) ____________________________________________________________________________________________   ____________________________________________________________________________________________  CBD (cannabidiol) WARNING  Applicable to: All individuals currently taking or considering taking CBD (cannabidiol) and, more important, all patients taking opioid analgesic controlled substances (pain medication). (Example: oxycodone; oxymorphone; hydrocodone; hydromorphone; morphine; methadone; tramadol; tapentadol; fentanyl; buprenorphine; butorphanol; dextromethorphan; meperidine; codeine; etc.)  Legal status: CBD remains a Schedule I drug prohibited for any use. CBD is illegal with one exception. In the [place], CBD has a limited Food and Drug Administration (FDA) approval for the treatment of two specific types of epilepsy disorders. Only one CBD product has been approved by the FDA for this purpose: "Epidiolex". FDA is aware that some companies are marketing products containing cannabis and cannabis-derived compounds in ways that violate the Federal Food, Drug and Cosmetic Act (FD&C Act) and that may put the health and safety of consumers at risk. The FDA, a Federal agency, has not enforced the CBD status since 2018.   Legality: Some manufacturers ship CBD products nationally, which is illegal. Often such products are sold online and are therefore available throughout the country. CBD is openly sold in head shops and health food stores in some states where such sales have not been explicitly legalized. Selling unapproved products with unsubstantiated therapeutic claims is not only a violation of the law, but also can put patients at risk, as these products have not been proven to be safe or effective. Federal illegality makes it difficult to conduct research on CBD.  Reference: "FDA Regulation of Cannabis and Cannabis-Derived Products, Including Cannabidiol  (CBD)" - https://www.fda.gov/news-events/public-health-focus/fda-regulation-cannabis-and-cannabis-derived-products-including-cannabidiol-cbd  Warning: CBD is not FDA approved and has not undergo the same manufacturing controls as prescription drugs.  This means that the purity and safety of available CBD may be questionable. Most of the time, despite manufacturer's claims, it is contaminated with THC (delta-9-tetrahydrocannabinol - the chemical in marijuana responsible for the "HIGH").  When this is the case, the THC contaminant will trigger a positive urine drug screen (UDS) test for Marijuana (carboxy-THC). Because a positive UDS for any illicit substance is a violation of our medication agreement, your opioid analgesics (pain medicine) may be permanently discontinued.  MORE ABOUT CBD  General Information: CBD  is a derivative of the Marijuana (cannabis sativa) plant discovered in 1940. It is one of the 113 identified substances found in Marijuana. It accounts for up to 40% of the plant's extract. As of 2018, preliminary clinical studies on CBD included research for the treatment of anxiety, movement disorders, and pain. CBD is available and consumed in multiple forms, including inhalation of smoke or vapor, as an aerosol spray, and by mouth. It may be supplied as an oil containing CBD, capsules, dried cannabis, or as a liquid solution. CBD is thought not to be as psychoactive as THC (delta-9-tetrahydrocannabinol - the chemical in marijuana responsible for the "HIGH"). Studies suggest that CBD may interact with different biological target receptors in the body, including cannabinoid and other neurotransmitter receptors. As of 2018 the mechanism of action for its biological effects has not been determined.  Side-effects  Adverse reactions: Dry mouth, diarrhea, decreased appetite, fatigue, drowsiness, malaise, weakness, sleep disturbances, and others.  Drug interactions: CBC may interact with other  medications such as blood-thinners. (Last update: 06/08/2020) ____________________________________________________________________________________________    

## 2020-10-07 NOTE — Progress Notes (Signed)
Nursing Pain Medication Assessment:  Safety precautions to be maintained throughout the outpatient stay will include: orient to surroundings, keep bed in low position, maintain call bell within reach at all times, provide assistance with transfer out of bed and ambulation.  Medication Inspection Compliance: Pill count conducted under aseptic conditions, in front of the patient. Neither the pills nor the bottle was removed from the patient's sight at any time. Once count was completed pills were immediately returned to the patient in their original bottle.  Medication: Hydrocodone/APAP Pill/Patch Count: 4 of 45 pills remain Pill/Patch Appearance: Markings consistent with prescribed medication Bottle Appearance: Standard pharmacy container. Clearly labeled. Filled Date: 26 / 8 / 21 Last Medication intake:  TodaySafety precautions to be maintained throughout the outpatient stay will include: orient to surroundings, keep bed in low position, maintain call bell within reach at all times, provide assistance with transfer out of bed and ambulation.

## 2020-10-10 DIAGNOSIS — M170 Bilateral primary osteoarthritis of knee: Secondary | ICD-10-CM | POA: Diagnosis not present

## 2020-11-13 ENCOUNTER — Emergency Department
Admission: EM | Admit: 2020-11-13 | Discharge: 2020-11-13 | Disposition: A | Discharge status: Home / Self Care | Payer: Medicare Other | Attending: Emergency Medicine | Admitting: Emergency Medicine

## 2020-11-13 ENCOUNTER — Encounter: Payer: Self-pay | Admitting: Emergency Medicine

## 2020-11-13 ENCOUNTER — Emergency Department: Payer: Medicare Other

## 2020-11-13 ENCOUNTER — Other Ambulatory Visit: Payer: Self-pay

## 2020-11-13 DIAGNOSIS — Z9889 Other specified postprocedural states: Secondary | ICD-10-CM | POA: Diagnosis not present

## 2020-11-13 DIAGNOSIS — M79601 Pain in right arm: Secondary | ICD-10-CM | POA: Insufficient documentation

## 2020-11-13 DIAGNOSIS — R600 Localized edema: Secondary | ICD-10-CM | POA: Diagnosis not present

## 2020-11-13 DIAGNOSIS — M79602 Pain in left arm: Secondary | ICD-10-CM | POA: Diagnosis not present

## 2020-11-13 DIAGNOSIS — M25561 Pain in right knee: Secondary | ICD-10-CM | POA: Diagnosis not present

## 2020-11-13 DIAGNOSIS — I10 Essential (primary) hypertension: Secondary | ICD-10-CM | POA: Insufficient documentation

## 2020-11-13 DIAGNOSIS — Z7984 Long term (current) use of oral hypoglycemic drugs: Secondary | ICD-10-CM | POA: Diagnosis not present

## 2020-11-13 DIAGNOSIS — M546 Pain in thoracic spine: Secondary | ICD-10-CM

## 2020-11-13 DIAGNOSIS — M25562 Pain in left knee: Secondary | ICD-10-CM | POA: Diagnosis not present

## 2020-11-13 DIAGNOSIS — E119 Type 2 diabetes mellitus without complications: Secondary | ICD-10-CM | POA: Diagnosis not present

## 2020-11-13 DIAGNOSIS — G8929 Other chronic pain: Secondary | ICD-10-CM | POA: Diagnosis not present

## 2020-11-13 LAB — CBC
HCT: 38.2 % (ref 36.0–46.0)
HCT: 37.6 % (ref 36.0–46.0)
Hemoglobin: 13 g/dL (ref 12.0–15.0)
Hemoglobin: 12.8 g/dL (ref 12.0–15.0)
MCH: 31.6 pg (ref 26.0–34.0)
MCH: 31.8 pg (ref 26.0–34.0)
MCHC: 34 g/dL (ref 30.0–36.0)
MCHC: 34 g/dL (ref 30.0–36.0)
MCV: 92.9 fL (ref 80.0–100.0)
MCV: 93.5 fL (ref 80.0–100.0)
Platelets: 215 10*3/uL (ref 150–400)
Platelets: 215 10*3/uL (ref 150–400)
RBC: 4.11 MIL/uL (ref 3.87–5.11)
RBC: 4.02 MIL/uL (ref 3.87–5.11)
RDW: 13.2 % (ref 11.5–15.5)
RDW: 13.1 % (ref 11.5–15.5)
WBC: 3 10*3/uL — ABNORMAL LOW (ref 4.0–10.5)
WBC: 3 10*3/uL — ABNORMAL LOW (ref 4.0–10.5)
nRBC: 0 % (ref 0.0–0.2)
nRBC: 0 % (ref 0.0–0.2)

## 2020-11-13 LAB — BASIC METABOLIC PANEL
Anion gap: 9 (ref 5–15)
BUN: 10 mg/dL (ref 6–20)
CO2: 25 mmol/L (ref 22–32)
Calcium: 8.8 mg/dL — ABNORMAL LOW (ref 8.9–10.3)
Chloride: 103 mmol/L (ref 98–111)
Creatinine, Ser: 0.44 mg/dL (ref 0.44–1.00)
GFR, Estimated: >60 mL/min (ref >60)
Glucose, Bld: 106 mg/dL — ABNORMAL HIGH (ref 70–99)
Potassium: 3.9 mmol/L (ref 3.5–5.1)
Sodium: 137 mmol/L (ref 135–145)

## 2020-11-13 LAB — DIFFERENTIAL
Abs Immature Granulocytes: 0 10*3/uL (ref 0.00–0.07)
Basophils Absolute: 0 10*3/uL (ref 0.0–0.1)
Basophils Relative: 1 %
Eosinophils Absolute: 0 10*3/uL (ref 0.0–0.5)
Eosinophils Relative: 0 %
Immature Granulocytes: 0 %
Lymphocytes Relative: 27 %
Lymphs Abs: 0.8 10*3/uL (ref 0.7–4.0)
Monocytes Absolute: 0.3 10*3/uL (ref 0.1–1.0)
Monocytes Relative: 11 %
Neutro Abs: 1.8 10*3/uL (ref 1.7–7.7)
Neutrophils Relative %: 61 %

## 2020-11-13 LAB — TROPONIN I (HIGH SENSITIVITY): Troponin I (High Sensitivity): 3 ng/L (ref <18)

## 2020-11-13 NOTE — ED Triage Notes (Signed)
Pt to ED from home c/o chest pain radiating to back and bilateral arms earlier today.  Back pain is sharp, chest pain aching, denies SOB n/v but has had diarrhea.  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  Pt A&Ox4, chest rise even and unlabored, skin WNL, in NAD at this time.

## 2020-11-13 NOTE — ED Provider Notes (Signed)
[facility] Emergency Department Provider Note  ____________________________________________  Time seen: Approximately 7:58 AM  I have reviewed the triage vital signs and the nursing notes.   HISTORY  Chief Complaint Chest Pain    HPI Stephanie [name] is a 60 y.o. female with a history of arthritis, diabetes, hypertension, chronic knee pain who comes ED complaining of upper back pain that started yesterday, gradual onset, sharp, worse with movement.  Radiates to bilateral arms.  No weakness or paresthesia.  No shortness of breath, not exertional.  No vomiting or diaphoresis.  This morning around 5:30 AM it also felt like she had anterior chest tightness which was not pleuritic.  This lasted about 2 hours and has now nearly resolved.  No recent exertional symptoms.  She was recently diagnosed with diabetes and started on metformin which is causing stomach upset and diarrhea.  Denies body aches fevers chills sweats or COVID exposure.  No cough.  No new leg pain or swelling.  She has chronic dependent edema and bilateral knee pain which is unchanged from baseline.      Past Medical History:  Diagnosis Date  . Allergy   . Anemia, iron deficiency 04/09/2014  . Arthritis   . Atypical chest pain 04/09/2014  . CD (contact dermatitis) 03/01/2014  . Chest pain 04/09/2014  . Lumbar radicular pain 08/22/2015     Patient Active Problem List   Diagnosis Date Noted  . Type 2 diabetes mellitus without complication, without long-term current use of insulin ([place]) 09/04/2020  . Depression 05/27/2020  . DDD (degenerative disc disease), lumbosacral 11/27/2019  . Acute exacerbation of chronic low back pain 10/25/2019  . Lumbar spondylitis ([place]) 10/25/2019  . 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 Lumbosacral radiculopathy at L5 (Right) 10/04/2019  . Chronic upper extremity pain (Bilateral) 10/04/2019  . Cervical radiculitis (Bilateral) 10/04/2019  .  Cervicalgia 10/04/2019  . Abnormal MRI, knee (04/04/2018) 05/22/2019  . 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 Eczema of hands (Bilateral) (R>L) 03/29/2019  . Pharmacologic therapy 07/06/2018  . Disorder of skeletal system 07/06/2018  . Problems influencing health status 07/06/2018  . Chronic pain syndrome 12/24/2016  . Musculoskeletal pain 04/15/2016  . Neurogenic pain 04/15/2016  . Spasm of paraspinal muscle 04/15/2016  . Chronic hip pain (Bilateral) (R>L) 04/15/2016  . Osteoarthritis of knee (Bilateral) 04/15/2016  . Osteoarthritis of hip (Bilateral) 04/15/2016  . Osteoarthritis of sacroiliac joint (Bilateral) 04/15/2016  . Chronic sacroiliac joint pain (Bilateral) 04/15/2016  . Chronic lower extremity pain (Secondary Area of Pain) (Bilateral) (L>R) 02/12/2016  . Chronic lumbar radicular pain (Right) (S1) 02/12/2016  . 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 Chronic knee pain [facility] Area of Pain) (Bilateral) (R>L) 02/12/2016  . 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) 02/12/2016  . Insomnia secondary to chronic pain 11/18/2015  . Lumbar spondylosis 08/23/2015  . 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 Spondylolisthesis of lumbar region 08/23/2015  . Sleep apnea 08/23/2015  . Sleep disturbance 08/23/2015  . Lumbar facet syndrome (Bilateral) (L>R) 08/22/2015  . Chronic low back pain (Primary area of Pain) (Bilateral) (L>R) 08/22/2015  . Encounter for therapeutic drug level monitoring 08/22/2015  . Long term current use of opiate analgesic 08/22/2015  . Long term prescription opiate use 08/22/2015  . Opiate use 08/22/2015  . Hypochromic microcytic anemia 04/09/2014  . Iron deficiency anemia 04/09/2014  . Chronic meniscal tear of knee (Right) 03/01/2014  . Contact eczematous dermatitis 03/01/2014  .  Vitamin B12 deficiency 03/01/2014  . Chronic back pain 03/01/2014  . Vitamin D deficiency 03/01/2014     Past Surgical  History:  Procedure Laterality Date  . APPENDECTOMY    . BACK SURGERY    . BUNIONECTOMY     BOTH FEET  . CESAREAN SECTION     X 2  . CHOLECYSTECTOMY       Prior to Admission medications   Medication Sig Start Date End Date Taking? Authorizing Provider  cyclobenzaprine (FLEXERIL) 10 MG tablet Take 1 tablet (10 mg total) by mouth daily. 10/07/20 01/05/21  Milinda Pointer, MD  gabapentin (NEURONTIN) 800 MG tablet Take 1 tablet (800 mg total) by mouth 3 (three) times daily. 10/07/20 01/05/21  Milinda Pointer, MD  HYDROcodone-acetaminophen (NORCO) 10-325 MG tablet Take 1 tablet by mouth every 12 (twelve) hours as needed for severe pain. Must last 30 days 10/08/20 11/07/20  Milinda Pointer, MD  HYDROcodone-acetaminophen [facility]) 10-325 MG tablet Take 1 tablet by mouth every 12 (twelve) hours as needed for severe pain. Must last 30 days 11/07/20 12/07/20  Milinda Pointer, MD  HYDROcodone-acetaminophen [facility]) 10-325 MG tablet Take 1 tablet by mouth every 12 (twelve) hours as needed for severe pain. Must last 30 days 12/07/20 01/06/21  Milinda Pointer, MD  Melatonin ([facility] MELATONIN) 10 MG CAPS Take 10-20 mg by mouth at bedtime. 10/07/20 01/05/21  Milinda Pointer, MD  meloxicam (MOBIC) 15 MG tablet Take 1 tablet (15 mg total) by mouth every morning. 10/07/20 01/05/21  Milinda Pointer, MD  triamcinolone ointment (KENALOG) 0.1 % Apply topically 3 (three) times daily. 10/07/20 01/05/21  Milinda Pointer, MD     Allergies Phenergan [promethazine hcl]   Family History  Problem Relation Age of Onset  . Stroke Mother   . Asthma Father     Social History Social History   Tobacco Use  . Smoking status: Never Smoker  . Smokeless tobacco: Never Used  Substance Use Topics  . Alcohol use: No    Alcohol/week: 0.0 standard drinks  . Drug use: No    Review of Systems  Constitutional:   No fever or chills.  ENT:   No sore throat. No rhinorrhea. Cardiovascular: Positive chest pain as above without  syncope. Respiratory:   No dyspnea or cough. Gastrointestinal:   Negative for abdominal pain, vomiting and diarrhea.  Musculoskeletal: Positive upper back pain.  Chronic dependent edema All other systems reviewed and are negative except as documented above in ROS and HPI.  ____________________________________________   PHYSICAL EXAM:  VITAL SIGNS: ED Triage Vitals [11/13/20 0637]  Enc Vitals Group     BP 123/71     Pulse Rate 65     Resp 14     Temp 98.5 F (36.9 C)     Temp Source Oral     SpO2 95 %     Weight 193 lb (87.5 kg)     Height 5\' 7"  (1.702 m)     Head Circumference      Peak Flow      Pain Score 8     Pain Loc      Pain Edu?      Excl. in [place]?     Vital signs reviewed, nursing assessments reviewed.   Constitutional:   Alert and oriented. Non-toxic appearance. Eyes:   Conjunctivae are normal. EOMI. PERRL. ENT      Head:   Normocephalic and atraumatic.      Nose:   Wearing a mask.      Mouth/Throat:  Wearing a mask.      Neck:   No meningismus. Full ROM. Hematological/Lymphatic/Immunilogical:   No cervical lymphadenopathy. Cardiovascular:   RRR. Symmetric bilateral radial pulses.  No murmurs. Cap refill less than 2 seconds. Respiratory:   Normal respiratory effort without tachypnea/retractions. Breath sounds are clear and equal bilaterally. No wheezes/rales/rhonchi. Gastrointestinal:   Soft and nontender. Non distended. There is no CVA tenderness.  No rebound, rigidity, or guarding. Musculoskeletal:   Normal range of motion in all extremities. No joint effusions.  No lower extremity tenderness.  No edema.  Symmetric calf circumference.  Back pain is reproduced with palpation of the rhomboids bilateral, especially on the left.  Neurologic:   Normal speech and language.  Motor grossly intact. No acute focal neurologic deficits are appreciated.  Skin:    Skin is warm, dry and intact. No rash noted.  No petechiae, purpura, or  bullae.  ____________________________________________    LABS (pertinent positives/negatives) (all labs ordered are listed, but only abnormal results are displayed) Labs Reviewed  BASIC METABOLIC PANEL - Abnormal; Notable for the following components:      Result Value   Glucose, Bld 106 (*)    Calcium 8.8 (*)    All other components within normal limits  CBC - Abnormal; Notable for the following components:   WBC 3.0 (*)    All other components within normal limits  CBC - Abnormal; Notable for the following components:   WBC 3.0 (*)    All other components within normal limits  DIFFERENTIAL  TROPONIN I (HIGH SENSITIVITY)   ____________________________________________   EKG  Interpreted by me  Date: 11/13/2020  Rate: 64  Rhythm: normal sinus rhythm  QRS Axis: normal  Intervals: normal  ST/T Wave abnormalities: normal  Conduction Disutrbances: none  Narrative Interpretation: unremarkable      ____________________________________________    RADIOLOGY  DG Chest 2 View  Result Date: 11/13/2020 CLINICAL DATA:  Chest pain. EXAM: CHEST - 2 VIEW COMPARISON:  Chest x-ray 04/09/2014. FINDINGS: Mediastinum and hilar structures normal. Heart size normal. Mild left base atelectasis and or scarring again noted. No interim change. No acute infiltrate. No pleural effusion or pneumothorax. No acute bony abnormality. Degenerative change thoracic spine. IMPRESSION: Mild left base atelectasis and or scarring again noted. No interim change. No acute cardiopulmonary disease identified. Electronically Signed   By: Marcello Moores  Register   On: 11/13/2020 06:54    ____________________________________________   PROCEDURES Procedures  ____________________________________________    CLINICAL IMPRESSION / ASSESSMENT AND PLAN / ED COURSE  Medications ordered in the ED: Medications - No data to display  Pertinent labs & imaging results that were available during my care of the patient  were reviewed by me and considered in my medical decision making (see chart for details).  Stephanie [name] was evaluated in Emergency Department on 11/13/2020 for the symptoms described in the history of present illness. She was evaluated in the context of the global COVID-19 pandemic, which necessitated consideration that the patient might be at risk for infection with the SARS-CoV-2 virus that causes COVID-19. Institutional protocols and algorithms that pertain to the evaluation of patients at risk for COVID-19 are in a state of rapid change based on information released by regulatory bodies including the CDC and federal and state organizations. These policies and algorithms were followed during the patient's care in the ED.   Patient presents with atypical chest pain, exam very consistent with musculoskeletal pain.  Vital signs are normal, EKG normal, chest x-ray and  labs are normal.   Considering the patient's symptoms, medical history, and physical examination today, I have low suspicion for ACS, PE, TAD, pneumothorax, carditis, mediastinitis, pneumonia, CHF, or sepsis.        ____________________________________________   FINAL CLINICAL IMPRESSION(S) / ED DIAGNOSES    Final diagnoses:  Acute left-sided thoracic back pain     ED Discharge Orders    None      Portions of this note were generated with dragon dictation software. Dictation errors may occur despite best attempts at proofreading.   Carrie Mew, MD 11/13/20 [phone]

## 2020-11-13 NOTE — ED Notes (Signed)
Pt verbalized understanding of d/c instructions at this time. Pt denies further questions at this time. Pt ambulatory to lobby, NAD noted, steady gait noted at this time.

## 2020-11-13 NOTE — ED Notes (Signed)
MD Stafford at bedside at this time. 

## 2020-11-13 NOTE — ED Notes (Signed)
This RN to bedside at this time. Introduced self to pt. Pt states chest pain that started early this morning. Pt denies needs at this time. Pt given warm blanket at this time. Call light within reach, pt encouraged to make needs known at this time.

## 2020-12-06 ENCOUNTER — Encounter: Admission: RE | Payer: Self-pay | Source: Home / Self Care

## 2020-12-06 ENCOUNTER — Inpatient Hospital Stay: Admission: RE | Admit: 2020-12-06 | Payer: Medicare Other | Source: Home / Self Care | Admitting: Orthopedic Surgery

## 2020-12-06 SURGERY — ARTHROPLASTY, KNEE, TOTAL, USING IMAGELESS COMPUTER-ASSISTED NAVIGATION
Anesthesia: Choice | Site: Knee | Laterality: Right

## 2020-12-29 NOTE — Progress Notes (Signed)
PROVIDER NOTE: Information contained herein reflects review and annotations entered in association with encounter. Interpretation of such information and data should be left to medically-trained personnel. Information provided to patient can be located elsewhere in the medical record under "Patient Instructions". Document created using STT-dictation technology, any transcriptional errors that may result from process are unintentional.    Patient: Stephanie [name]  Service Category: E/M  Provider: Gaspar Cola, MD  DOB: [birth year]  DOS: 12/30/2020  Specialty: Interventional Pain Management  MRN: [id number]  Setting: Ambulatory outpatient  PCP: Toni Arthurs, NP  Type: Established Patient    Referring Provider: Toni Arthurs, NP  Location: Office  Delivery: Face-to-face     HPI  [name], a 60 y.o. year old female, is here today because of her Chronic pain syndrome [G89.4]. [name] primary complain today is Back Pain (low) Last encounter: My last encounter with her was on 10/07/2020. Pertinent problems: 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); Musculoskeletal pain; Neurogenic pain; Spasm of paraspinal muscle; Chronic hip pain (Bilateral) (R>L); Osteoarthritis of knee (Bilateral); Osteoarthritis of hip (Bilateral); Osteoarthritis of sacroiliac joint (Bilateral); Chronic sacroiliac joint pain (Bilateral); Chronic pain syndrome; Failed back surgical syndrome; Eczema of  hands (Bilateral) (R>L); Abnormal MRI, knee (04/04/2018); 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 Pain Assessment: Severity of Chronic pain is reported as a 6 /10. Location: Back Lower/radiates down right leg in the front to toes, including all toes and foot. Onset: More than a month ago. Quality: Sharp. Timing: Intermittent. Modifying factor(s): not extending  leg, walking. Vitals:  height is 5' 7"  (1.702 m) and weight is 192 lb (87.1 kg). Her temperature is 97.2 F (36.2 C) (abnormal). Her blood pressure is 148/73 (abnormal) and her pulse is 84. Her respiration is 16 and oxygen saturation is 99%.   Reason for encounter: medication management.   The patient indicates doing well with the current medication regimen. No adverse reactions or side effects reported to the medications.  The patient is pending to have some right knee surgery this next month.  Today we have provided her with the surgeons handout for the management of the acute postop pain.  RTCB: 04/06/2021 Nonopioids transferred 10/07/2020: Mobic, Neurontin, Flexeril, melatonin, and triamcinolone ointment.  Pharmacotherapy Assessment   Analgesic: Hydrocodone/acetaminophen 10/325 mg, 1 tab PO BID (20 mg/day of hydrocodone) MME/day:41m/day.   Monitoring: [place] PMP: PDMP reviewed during this encounter.       Pharmacotherapy: No side-effects or adverse reactions reported. Compliance: No problems identified. Effectiveness: Clinically acceptable.  TDewayne Shorter RN  12/30/2020  8:31 AM  Signed Nursing Pain Medication Assessment:  Safety precautions to be maintained throughout the outpatient stay will include: orient to surroundings, keep bed in low position, maintain  call bell within reach at all times, provide assistance with transfer out of bed and ambulation.  Medication Inspection Compliance: Pill count conducted under aseptic  conditions, in front of the patient. Neither the pills nor the bottle was removed from the patient's sight at any time. Once count was completed pills were immediately returned to the patient in their original bottle.  Medication: Hydrocodone/APAP Pill/Patch Count: 13.5 of 45 pills remain Pill/Patch Appearance: Markings consistent with prescribed medication Bottle Appearance: Standard pharmacy container. Clearly labeled. Filled Date: 02 / 10 / 2022 Last Medication intake:  Today    UDS:  Summary  Date Value Ref Range Status  02/27/2020 Note  Final    Comment:    ==================================================================== [employer] (MW) ==================================================================== Test                             Result       Flag       Units Drug Present and Declared for Prescription Verification   Hydrocodone                    1404         EXPECTED   ng/mg creat   Hydromorphone                  241          EXPECTED   ng/mg creat   Dihydrocodeine                 99           EXPECTED   ng/mg creat   Norhydrocodone                 >1866        EXPECTED   ng/mg creat    Sources of hydrocodone include scheduled prescription medications.    Hydromorphone, dihydrocodeine and norhydrocodone are expected    metabolites of hydrocodone. Hydromorphone and dihydrocodeine are    also available as scheduled prescription medications. ==================================================================== Test                      Result    Flag   Units      Ref Range   Creatinine              268              mg/dL      >=20 ==================================================================== Declared Medications:  The flagging and interpretation on this report are based on the  following  declared medications.  Unexpected results may arise from  inaccuracies in the declared medications.  **Note: The testing scope of this panel includes these medications:  Hydrocodone  **Note: The testing scope of this panel does not include the  following reported medications:  Acetaminophen  Cyclobenzaprine  Gabapentin  Melatonin  Meloxicam  Triamcinolone (Kenalog) ==================================================================== For clinical consultation, please call [phone]. ====================================================================      ROS  Constitutional: Denies any fever or chills Gastrointestinal: No reported hemesis, hematochezia, vomiting, or acute GI distress Musculoskeletal: Denies any acute onset joint swelling, redness, loss of ROM, or weakness Neurological: No reported episodes of acute onset apraxia, aphasia, dysarthria, agnosia, amnesia, paralysis, loss of coordination, or loss of consciousness  Medication Review  Cholecalciferol, HYDROcodone-acetaminophen, Melatonin, ascorbic acid, cyanocobalamin, cyclobenzaprine, gabapentin, lurasidone, meloxicam, metFORMIN, triamcinolone ointment, and venlafaxine XR  History Review  Allergy: Ms. Butkiewicz is allergic to phenergan [promethazine hcl]. Drug: Ms. Haffey  reports no history of drug use. Alcohol:  reports no history of alcohol use. Tobacco:  reports that she has  never smoked. She has never used smokeless tobacco. Social: Ms. Hamed  reports that she has never smoked. She has never used smokeless tobacco. She reports that she does not drink alcohol and does not use drugs. Medical:  has a past medical history of Allergy, Anemia, iron deficiency (04/09/2014), Arthritis, Atypical chest pain (04/09/2014), CD (contact dermatitis) (03/01/2014), Chest pain (04/09/2014), Diabetes mellitus without complication ([place]), and Lumbar radicular pain (08/22/2015). Surgical: Ms. Leder  has a past surgical history that  includes Back surgery; Appendectomy; Cholecystectomy; Cesarean section; and Bunionectomy. Family: family history includes Asthma in her father; Stroke in her mother.  Laboratory Chemistry Profile   Renal Lab Results  Component Value Date   BUN 10 11/13/2020   CREATININE 0.44 11/13/2020   GFRAA >60 09/01/2019   GFRNONAA >60 11/13/2020     Hepatic Lab Results  Component Value Date   AST 15 09/01/2019   ALT 13 09/01/2019   ALBUMIN 4.2 09/01/2019   ALKPHOS 61 09/01/2019     Electrolytes Lab Results  Component Value Date   NA 137 11/13/2020   K 3.9 11/13/2020   CL 103 11/13/2020   CALCIUM 8.8 (L) 11/13/2020   MG 1.9 09/01/2019     Bone Lab Results  Component Value Date   VD25OH 33.18 09/01/2019   25OHVITD1 34 05/07/2016   25OHVITD2 <1.0 05/07/2016   25OHVITD3 34 05/07/2016     Inflammation (CRP: Acute Phase) (ESR: Chronic Phase) Lab Results  Component Value Date   CRP <0.8 09/01/2019   ESRSEDRATE 18 09/01/2019       Note: Above Lab results reviewed.  Recent Imaging Review  DG Chest 2 View CLINICAL DATA:  Chest pain.  EXAM: CHEST - 2 VIEW  COMPARISON:  Chest x-ray 04/09/2014.  FINDINGS: Mediastinum and hilar structures normal. Heart size normal. Mild left base atelectasis and or scarring again noted. No interim change. No acute infiltrate. No pleural effusion or pneumothorax. No acute bony abnormality. Degenerative change thoracic spine.  IMPRESSION: Mild left base atelectasis and or scarring again noted. No interim change. No acute cardiopulmonary disease identified.  Electronically Signed   By: Marcello Moores  Register   On: 11/13/2020 06:54 Note: Reviewed        Physical Exam  General appearance: Well nourished, well developed, and well hydrated. In no apparent acute distress Mental status: Alert, oriented x 3 (person, place, & time)       Respiratory: No evidence of acute respiratory distress Eyes: PERLA Vitals: BP (!) 148/73   Pulse 84   Temp (!)  97.2 F (36.2 C)   Resp 16   Ht 5' 7"  (1.702 m)   Wt 192 lb (87.1 kg)   LMP  (LMP Unknown)   SpO2 99%   BMI 30.07 kg/m  BMI: Estimated body mass index is 30.07 kg/m as calculated from the following:   Height as of this encounter: 5' 7"  (1.702 m).   Weight as of this encounter: 192 lb (87.1 kg). Ideal: Ideal body weight: 61.6 kg (135 lb 12.9 oz) Adjusted ideal body weight: 71.8 kg (158 lb 4.5 oz)  Assessment   Status Diagnosis  Controlled Controlled Controlled 1. Chronic pain syndrome   2. Chronic low back pain (Primary area of Pain) (Bilateral) (L>R)   3. Chronic lumbar radicular pain (Right) (S1)   4. Failed back surgical syndrome   5. Chronic pain of both hips   6. Chronic lower extremity pain (Secondary Area of Pain) (Bilateral) (L>R)   7. Chronic knee pain [facility]  of Pain) (Bilateral) (R>L)   8. Pharmacologic therapy   9. Uncomplicated opioid dependence ([place])      Updated Problems: Problem  Uncomplicated Opioid Dependence ([facility])    Plan of Care  Problem-specific:  No problem-specific Assessment & Plan notes found for this encounter.  Ms. Kenadie Royce has a current medication list which includes the following long-term medication(s): cyclobenzaprine, gabapentin, [START ON 01/06/2021] hydrocodone-acetaminophen, [START ON 02/05/2021] hydrocodone-acetaminophen, [START ON 03/07/2021] hydrocodone-acetaminophen, lurasidone, melatonin, meloxicam, metformin, triamcinolone ointment, and venlafaxine xr.  Pharmacotherapy (Medications Ordered): Meds ordered this encounter  Medications  . HYDROcodone-acetaminophen ([place]CO) 10-325 MG tablet    Sig: Take 1 tablet by mouth every 12 (twelve) hours as needed for severe pain. Must last 30 days    Dispense:  45 tablet    Refill:  0    Chronic Pain: STOP Act (Not applicable) Fill 1 day early if closed on refill date. Avoid benzodiazepines within 8 hours of opioids  . HYDROcodone-acetaminophen (NORCO) 10-325 MG tablet    Sig:  Take 1 tablet by mouth every 12 (twelve) hours as needed for severe pain. Must last 30 days    Dispense:  45 tablet    Refill:  0    Chronic Pain: STOP Act (Not applicable) Fill 1 day early if closed on refill date. Avoid benzodiazepines within 8 hours of opioids  . HYDROcodone-acetaminophen (NORCO) 10-325 MG tablet    Sig: Take 1 tablet by mouth every 12 (twelve) hours as needed for severe pain. Must last 30 days    Dispense:  45 tablet    Refill:  0    Chronic Pain: STOP Act (Not applicable) Fill 1 day early if closed on refill date. Avoid benzodiazepines within 8 hours of opioids   Orders:  No orders of the defined types were placed in this encounter.  Follow-up plan:   Return in about 3 months (around 04/06/2021) for (F2F), (Med Mgmt).      Interventional therapies: Planned, scheduled, and/or pending:   Diagnostic right-sided caudal ESI #2 under fluoroscopic guidance and IV sedation    Considering:   Diagnostic bilateral lumbar facet block Possible bilateral lumbar facet RFA Diagnostic Caudal ESI Diagnostic Bilateral L4-5 & L5-S1Lumbar TFESI Diagnostic bilateral sacroiliac joint block Possible bilateral sacroiliac joint RFA Diagnostic bilateral hip joint injection Possible bilateral hip joint RFA Diagnostic bilateral knee joint injection Possible series of Hyalgan knee injections Diagnostic bilateral genicular nerve block Possible bilateral genicular nerve RFA.  Possible Racz procedure    Palliative PRN treatment(s):   Palliative Caudal ESI #2 (Last done 02/01/2017) (100/100/80/80)       Recent Visits Date Type Provider Dept  10/07/20 Office Visit Milinda Pointer, MD [facility]  Showing recent visits within past 90 days and meeting all other requirements Today's Visits Date Type Provider Dept  12/30/20 Office Visit Milinda Pointer, MD [facility]  Showing today's visits and meeting all other requirements Future Appointments No visits were  found meeting these conditions. Showing future appointments within next 90 days and meeting all other requirements  I discussed the assessment and treatment plan with the patient. The patient was provided an opportunity to ask questions and all were answered. The patient agreed with the plan and demonstrated an understanding of the instructions.  Patient advised to call back or seek an in-person evaluation if the symptoms or condition worsens.  Duration of encounter: 30 minutes.  Note by: Gaspar Cola, MD Date: 12/30/2020; Time: 8:40 AM

## 2020-12-30 ENCOUNTER — Ambulatory Visit: Payer: No Typology Code available for payment source | Attending: Pain Medicine | Admitting: Pain Medicine

## 2020-12-30 ENCOUNTER — Other Ambulatory Visit: Payer: Self-pay

## 2020-12-30 ENCOUNTER — Encounter: Payer: Self-pay | Admitting: Pain Medicine

## 2020-12-30 VITALS — BP 148/73 | HR 84 | Temp 97.2°F | Resp 16 | Ht 67.0 in | Wt 192.0 lb

## 2020-12-30 DIAGNOSIS — F1120 Opioid dependence, uncomplicated: Secondary | ICD-10-CM | POA: Insufficient documentation

## 2020-12-30 DIAGNOSIS — M25562 Pain in left knee: Secondary | ICD-10-CM | POA: Diagnosis present

## 2020-12-30 DIAGNOSIS — M961 Postlaminectomy syndrome, not elsewhere classified: Secondary | ICD-10-CM | POA: Diagnosis not present

## 2020-12-30 DIAGNOSIS — M25552 Pain in left hip: Secondary | ICD-10-CM | POA: Insufficient documentation

## 2020-12-30 DIAGNOSIS — M5416 Radiculopathy, lumbar region: Secondary | ICD-10-CM | POA: Insufficient documentation

## 2020-12-30 DIAGNOSIS — M5442 Lumbago with sciatica, left side: Secondary | ICD-10-CM | POA: Diagnosis not present

## 2020-12-30 DIAGNOSIS — G894 Chronic pain syndrome: Secondary | ICD-10-CM | POA: Diagnosis present

## 2020-12-30 DIAGNOSIS — M25561 Pain in right knee: Secondary | ICD-10-CM | POA: Diagnosis present

## 2020-12-30 DIAGNOSIS — Z79899 Other long term (current) drug therapy: Secondary | ICD-10-CM | POA: Insufficient documentation

## 2020-12-30 DIAGNOSIS — M5441 Lumbago with sciatica, right side: Secondary | ICD-10-CM | POA: Insufficient documentation

## 2020-12-30 DIAGNOSIS — G8929 Other chronic pain: Secondary | ICD-10-CM | POA: Diagnosis present

## 2020-12-30 DIAGNOSIS — M79605 Pain in left leg: Secondary | ICD-10-CM | POA: Diagnosis present

## 2020-12-30 DIAGNOSIS — M25551 Pain in right hip: Secondary | ICD-10-CM | POA: Insufficient documentation

## 2020-12-30 DIAGNOSIS — M79604 Pain in right leg: Secondary | ICD-10-CM | POA: Insufficient documentation

## 2020-12-30 MED ORDER — HYDROCODONE-ACETAMINOPHEN 10-325 MG PO TABS: 1.0000 | ORAL_TABLET | Freq: Two times a day (BID) | ORAL | 0 refills | Status: DC | PRN

## 2020-12-30 NOTE — Patient Instructions (Signed)
____________________________________________________________________________________________  Medication Rules  Purpose: To inform patients, and their family members, of our rules and regulations.  Applies to: All patients receiving prescriptions (written or electronic).  Pharmacy of record: Pharmacy where electronic prescriptions will be sent. If written prescriptions are taken to a different pharmacy, please inform the nursing staff. The pharmacy listed in the electronic medical record should be the one where you would like electronic prescriptions to be sent.  Electronic prescriptions: In compliance with the [place] Strengthen Opioid Misuse Prevention (STOP) Act of 2017 (Session Law 2017-74/H243), effective November 02, 2018, all controlled substances must be electronically prescribed. Calling prescriptions to the pharmacy will cease to exist.  Prescription refills: Only during scheduled appointments. Applies to all prescriptions.  NOTE: The following applies primarily to controlled substances (Opioid* Pain Medications).   Type of encounter (visit): For patients receiving controlled substances, face-to-face visits are required. (Not an option or up to the patient.)  Patient's responsibilities: 1. Pain Pills: Bring all pain pills to every appointment (except for procedure appointments). 2. Pill Bottles: Bring pills in original pharmacy bottle. Always bring the newest bottle. Bring bottle, even if empty. 3. Medication refills: You are responsible for knowing and keeping track of what medications you take and those you need refilled. The day before your appointment: write a list of all prescriptions that need to be refilled. The day of the appointment: give the list to the admitting nurse. Prescriptions will be written only during appointments. No prescriptions will be written on procedure days. If you forget a medication: it will not be "Called in", "Faxed", or "electronically sent".  You will need to get another appointment to get these prescribed. No early refills. Do not call asking to have your prescription filled early. 4. Prescription Accuracy: You are responsible for carefully inspecting your prescriptions before leaving our office. Have the discharge nurse carefully go over each prescription with you, before taking them home. Make sure that your name is accurately spelled, that your address is correct. Check the name and dose of your medication to make sure it is accurate. Check the number of pills, and the written instructions to make sure they are clear and accurate. Make sure that you are given enough medication to last until your next medication refill appointment. 5. Taking Medication: Take medication as prescribed. When it comes to controlled substances, taking less pills or less frequently than prescribed is permitted and encouraged. Never take more pills than instructed. Never take medication more frequently than prescribed.  6. Inform other Doctors: Always inform, all of your healthcare providers, of all the medications you take. 7. Pain Medication from other Providers: You are not allowed to accept any additional pain medication from any other Doctor or Healthcare provider. There are two exceptions to this rule. (see below) In the event that you require additional pain medication, you are responsible for notifying us, as stated below. 8. Cough Medicine: Often these contain an opioid, such as codeine or hydrocodone. Never accept or take cough medicine containing these opioids if you are already taking an opioid* medication. The combination may cause respiratory failure and death. 9. Medication Agreement: You are responsible for carefully reading and following our Medication Agreement. This must be signed before receiving any prescriptions from our practice. Safely store a copy of your signed Agreement. Violations to the Agreement will result in no further prescriptions.  (Additional copies of our Medication Agreement are available upon request.) 10. Laws, Rules, & Regulations: All patients are expected to follow all   Federal and State Laws, Statutes, Rules, & Regulations. Ignorance of the Laws does not constitute a valid excuse.  11. Illegal drugs and Controlled Substances: The use of illegal substances (including, but not limited to marijuana and its derivatives) and/or the illegal use of any controlled substances is strictly prohibited. Violation of this rule may result in the immediate and permanent discontinuation of any and all prescriptions being written by our practice. The use of any illegal substances is prohibited. 12. Adopted CDC guidelines & recommendations: Target dosing levels will be at or below 60 MME/day. Use of benzodiazepines** is not recommended.  Exceptions: There are only two exceptions to the rule of not receiving pain medications from other Healthcare Providers. 1. Exception #1 (Emergencies): In the event of an emergency (i.e.: accident requiring emergency care), you are allowed to receive additional pain medication. However, you are responsible for: As soon as you are able, call our office [phone], at any time of the day or night, and leave a message stating your name, the date and nature of the emergency, and the name and dose of the medication prescribed. In the event that your call is answered by a member of our staff, make sure to document and save the date, time, and the name of the person that took your information.  2. Exception #2 (Planned Surgery): In the event that you are scheduled by another doctor or dentist to have any type of surgery or procedure, you are allowed (for a period no longer than 30 days), to receive additional pain medication, for the acute post-op pain. However, in this case, you are responsible for picking up a copy of our "Post-op Pain Management for Surgeons" handout, and giving it to your surgeon or dentist. This  document is available at our office, and does not require an appointment to obtain it. Simply go to our office during business hours (Monday-Thursday from 8:00 AM to 4:00 PM) (Friday 8:00 AM to 12:00 Noon) or if you have a scheduled appointment with us, prior to your surgery, and ask for it by name. In addition, you are responsible for: calling our office [phone], at any time of the day or night, and leaving a message stating your name, name of your surgeon, type of surgery, and date of procedure or surgery. Failure to comply with your responsibilities may result in termination of therapy involving the controlled substances.  *Opioid medications include: morphine, codeine, oxycodone, oxymorphone, hydrocodone, hydromorphone, meperidine, tramadol, tapentadol, buprenorphine, fentanyl, methadone. **Benzodiazepine medications include: diazepam (Valium), alprazolam (Xanax), clonazepam (Klonopine), lorazepam (Ativan), clorazepate (Tranxene), chlordiazepoxide (Librium), estazolam (Prosom), oxazepam (Serax), temazepam (Restoril), triazolam (Halcion) (Last updated: 09/30/2020) ____________________________________________________________________________________________   ____________________________________________________________________________________________  Medication Recommendations and Reminders  Applies to: All patients receiving prescriptions (written and/or electronic).  Medication Rules & Regulations: These rules and regulations exist for your safety and that of others. They are not flexible and neither are we. Dismissing or ignoring them will be considered "non-compliance" with medication therapy, resulting in complete and irreversible termination of such therapy. (See document titled "Medication Rules" for more details.) In all conscience, because of safety reasons, we cannot continue providing a therapy where the patient does not follow instructions.  Pharmacy of record:   Definition:  This is the pharmacy where your electronic prescriptions will be sent.   We do not endorse any particular pharmacy, however, we have experienced problems with [facility] not securing enough medication supply for the community.  We do not restrict you in your choice of pharmacy. However,   once we write for your prescriptions, we will NOT be re-sending more prescriptions to fix restricted supply problems created by your pharmacy, or your insurance.   The pharmacy listed in the electronic medical record should be the one where you want electronic prescriptions to be sent.  If you choose to change pharmacy, simply notify our nursing staff.  Recommendations:  Keep all of your pain medications in a safe place, under lock and key, even if you live alone. We will NOT replace lost, stolen, or damaged medication.  After you fill your prescription, take 1 week's worth of pills and put them away in a safe place. You should keep a separate, properly labeled bottle for this purpose. The remainder should be kept in the original bottle. Use this as your primary supply, until it runs out. Once it's gone, then you know that you have 1 week's worth of medicine, and it is time to come in for a prescription refill. If you do this correctly, it is unlikely that you will ever run out of medicine.  To make sure that the above recommendation works, it is very important that you make sure your medication refill appointments are scheduled at least 1 week before you run out of medicine. To do this in an effective manner, make sure that you do not leave the office without scheduling your next medication management appointment. Always ask the nursing staff to show you in your prescription , when your medication will be running out. Then arrange for the receptionist to get you a return appointment, at least 7 days before you run out of medicine. Do not wait until you have 1 or 2 pills left, to come in. This is very poor planning and  does not take into consideration that we may need to cancel appointments due to bad weather, sickness, or emergencies affecting our staff.  DO NOT ACCEPT A "Partial Fill": If for any reason your pharmacy does not have enough pills/tablets to completely fill or refill your prescription, do not allow for a "partial fill". The law allows the pharmacy to complete that prescription within 72 hours, without requiring a new prescription. If they do not fill the rest of your prescription within those 72 hours, you will need a separate prescription to fill the remaining amount, which we will NOT provide. If the reason for the partial fill is your insurance, you will need to talk to the pharmacist about payment alternatives for the remaining tablets, but again, DO NOT ACCEPT A PARTIAL FILL, unless you can trust your pharmacist to obtain the remainder of the pills within 72 hours.  Prescription refills and/or changes in medication(s):   Prescription refills, and/or changes in dose or medication, will be conducted only during scheduled medication management appointments. (Applies to both, written and electronic prescriptions.)  No refills on procedure days. No medication will be changed or started on procedure days. No changes, adjustments, and/or refills will be conducted on a procedure day. Doing so will interfere with the diagnostic portion of the procedure.  No phone refills. No medications will be "called into the pharmacy".  No Fax refills.  No weekend refills.  No Holliday refills.  No after hours refills.  Remember:  Business hours are:  Monday to Thursday 8:00 AM to 4:00 PM Provider's Schedule: Leatta Alewine, MD - Appointments are:  Medication management: Monday and Wednesday 8:00 AM to 4:00 PM Procedure day: Tuesday and Thursday 7:30 AM to 4:00 PM Bilal Lateef, MD - Appointments are:    Medication management: Tuesday and Thursday 8:00 AM to 4:00 PM Procedure day: Monday and Wednesday  7:30 AM to 4:00 PM (Last update: 05/22/2020) ____________________________________________________________________________________________   ____________________________________________________________________________________________  CBD (cannabidiol) WARNING  Applicable to: All individuals currently taking or considering taking CBD (cannabidiol) and, more important, all patients taking opioid analgesic controlled substances (pain medication). (Example: oxycodone; oxymorphone; hydrocodone; hydromorphone; morphine; methadone; tramadol; tapentadol; fentanyl; buprenorphine; butorphanol; dextromethorphan; meperidine; codeine; etc.)  Legal status: CBD remains a Schedule I drug prohibited for any use. CBD is illegal with one exception. In the [place], CBD has a limited Food and Drug Administration (FDA) approval for the treatment of two specific types of epilepsy disorders. Only one CBD product has been approved by the FDA for this purpose: "Epidiolex". FDA is aware that some companies are marketing products containing cannabis and cannabis-derived compounds in ways that violate the Federal Food, Drug and Cosmetic Act (FD&C Act) and that may put the health and safety of consumers at risk. The FDA, a Federal agency, has not enforced the CBD status since 2018.   Legality: Some manufacturers ship CBD products nationally, which is illegal. Often such products are sold online and are therefore available throughout the country. CBD is openly sold in head shops and health food stores in some states where such sales have not been explicitly legalized. Selling unapproved products with unsubstantiated therapeutic claims is not only a violation of the law, but also can put patients at risk, as these products have not been proven to be safe or effective. Federal illegality makes it difficult to conduct research on CBD.  Reference: "FDA Regulation of Cannabis and Cannabis-Derived Products, Including Cannabidiol  (CBD)" - https://www.fda.gov/news-events/public-health-focus/fda-regulation-cannabis-and-cannabis-derived-products-including-cannabidiol-cbd  Warning: CBD is not FDA approved and has not undergo the same manufacturing controls as prescription drugs.  This means that the purity and safety of available CBD may be questionable. Most of the time, despite manufacturer's claims, it is contaminated with THC (delta-9-tetrahydrocannabinol - the chemical in marijuana responsible for the "HIGH").  When this is the case, the THC contaminant will trigger a positive urine drug screen (UDS) test for Marijuana (carboxy-THC). Because a positive UDS for any illicit substance is a violation of our medication agreement, your opioid analgesics (pain medicine) may be permanently discontinued.  MORE ABOUT CBD  General Information: CBD  is a derivative of the Marijuana (cannabis sativa) plant discovered in 1940. It is one of the 113 identified substances found in Marijuana. It accounts for up to 40% of the plant's extract. As of 2018, preliminary clinical studies on CBD included research for the treatment of anxiety, movement disorders, and pain. CBD is available and consumed in multiple forms, including inhalation of smoke or vapor, as an aerosol spray, and by mouth. It may be supplied as an oil containing CBD, capsules, dried cannabis, or as a liquid solution. CBD is thought not to be as psychoactive as THC (delta-9-tetrahydrocannabinol - the chemical in marijuana responsible for the "HIGH"). Studies suggest that CBD may interact with different biological target receptors in the body, including cannabinoid and other neurotransmitter receptors. As of 2018 the mechanism of action for its biological effects has not been determined.  Side-effects  Adverse reactions: Dry mouth, diarrhea, decreased appetite, fatigue, drowsiness, malaise, weakness, sleep disturbances, and others.  Drug interactions: CBC may interact with other  medications such as blood-thinners. (Last update: 06/08/2020) ____________________________________________________________________________________________   ____________________________________________________________________________________________  Drug Holidays (Slow)  What is a "Drug Holiday"? Drug Holiday: is the name given to the period of time during   which a patient stops taking a medication(s) for the purpose of eliminating tolerance to the drug.  Benefits . Improved effectiveness of opioids. . Decreased opioid dose needed to achieve benefits. . Improved pain with lesser dose.  What is tolerance? Tolerance: is the progressive decreased in effectiveness of a drug due to its repetitive use. With repetitive use, the body gets use to the medication and as a consequence, it loses its effectiveness. This is a common problem seen with opioid pain medications. As a result, a larger dose of the drug is needed to achieve the same effect that used to be obtained with a smaller dose.  How long should a "Drug Holiday" last? You should stay off of the pain medicine for at least 14 consecutive days. (2 weeks)  Should I stop the medicine "cold turkey"? No. You should always coordinate with your Pain Specialist so that he/she can provide you with the correct medication dose to make the transition as smoothly as possible.  How do I stop the medicine? Slowly. You will be instructed to decrease the daily amount of pills that you take by one (1) pill every seven (7) days. This is called a "slow downward taper" of your dose. For example: if you normally take four (4) pills per day, you will be asked to drop this dose to three (3) pills per day for seven (7) days, then to two (2) pills per day for seven (7) days, then to one (1) per day for seven (7) days, and at the end of those last seven (7) days, this is when the "Drug Holiday" would start.   Will I have withdrawals? By doing a "slow downward  taper" like this one, it is unlikely that you will experience any significant withdrawal symptoms. Typically, what triggers withdrawals is the sudden stop of a high dose opioid therapy. Withdrawals can usually be avoided by slowly decreasing the dose over a prolonged period of time. If you do not follow these instructions and decide to stop your medication abruptly, withdrawals may be possible.  What are withdrawals? Withdrawals: refers to the wide range of symptoms that occur after stopping or dramatically reducing opiate drugs after heavy and prolonged use. Withdrawal symptoms do not occur to patients that use low dose opioids, or those who take the medication sporadically. Contrary to benzodiazepine (example: Valium, Xanax, etc.) or alcohol withdrawals ("Delirium Tremens"), opioid withdrawals are not lethal. Withdrawals are the physical manifestation of the body getting rid of the excess receptors.  Expected Symptoms Early symptoms of withdrawal may include: . Agitation . Anxiety . Muscle aches . Increased tearing . Insomnia . Runny nose . Sweating . Yawning  Late symptoms of withdrawal may include: . Abdominal cramping . Diarrhea . Dilated pupils . Goose bumps . Nausea . Vomiting  Will I experience withdrawals? Due to the slow nature of the taper, it is very unlikely that you will experience any.  What is a slow taper? Taper: refers to the gradual decrease in dose.  (Last update: 05/22/2020) ____________________________________________________________________________________________     

## 2020-12-30 NOTE — Progress Notes (Signed)
Nursing Pain Medication Assessment:  Safety precautions to be maintained throughout the outpatient stay will include: orient to surroundings, keep bed in low position, maintain call bell within reach at all times, provide assistance with transfer out of bed and ambulation.  Medication Inspection Compliance: Pill count conducted under aseptic conditions, in front of the patient. Neither the pills nor the bottle was removed from the patient's sight at any time. Once count was completed pills were immediately returned to the patient in their original bottle.  Medication: Hydrocodone/APAP Pill/Patch Count: 13.5 of 45 pills remain Pill/Patch Appearance: Markings consistent with prescribed medication Bottle Appearance: Standard pharmacy container. Clearly labeled. Filled Date: 02 / 10 / 2022 Last Medication intake:  Today

## 2021-01-06 ENCOUNTER — Encounter: Payer: Medicare Other | Admitting: Pain Medicine

## 2021-01-12 NOTE — Discharge Instructions (Signed)
Instructions after Total Knee Replacement   Stephanie Wiers P. Myleen Brailsford, Jr., M.D.     Dept. of Orthopaedics & Sports Medicine  [facility]  [address]  [place], [place]  [address]  Phone: [phone]   Fax: [phone]    DIET: Drink plenty of non-alcoholic fluids. Resume your normal diet. Include foods high in fiber.  ACTIVITY:  You may use crutches or a walker with weight-bearing as tolerated, unless instructed otherwise. You may be weaned off of the walker or crutches by your Physical Therapist.  Do NOT place pillows under the knee. Anything placed under the knee could limit your ability to straighten the knee.   Continue doing gentle exercises. Exercising will reduce the pain and swelling, increase motion, and prevent muscle weakness.   Please continue to use the TED compression stockings for 6 weeks. You may remove the stockings at night, but should reapply them in the morning. Do not drive or operate any equipment until instructed.  WOUND CARE:  Continue to use the PolarCare or ice packs periodically to reduce pain and swelling. You may bathe or shower after the staples are removed at the first office visit following surgery.  MEDICATIONS: You may resume your regular medications. Please take the pain medication as prescribed on the medication. Do not take pain medication on an empty stomach. You have been given a prescription for a blood thinner (Lovenox or Coumadin). Please take the medication as instructed. (NOTE: After completing a 2 week course of Lovenox, take one Enteric-coated aspirin once a day. This along with elevation will help reduce the possibility of phlebitis in your operated leg.) Do not drive or drink alcoholic beverages when taking pain medications.  CALL THE OFFICE FOR: Temperature above 101 degrees Excessive bleeding or drainage on the dressing. Excessive swelling, coldness, or paleness of the toes. Persistent nausea and vomiting.  FOLLOW-UP:  You  should have an appointment to return to the office in 10-14 days after surgery. Arrangements have been made for continuation of Physical Therapy (either home therapy or outpatient therapy).   [facility] Department Directory         [email or web address]       [email or web address]          Cardiology  Appointments: [place] - [phone] [place] - [phone]  Endocrinology  Appointments: [place] - [phone] [place] - [phone]  Gastroenterology  Appointments: [place] - [phone] [place] - [phone]        General Surgery   Appointments: [place] - [phone]  Internal Medicine/Family Medicine  Appointments: [place] - [phone] [place] - [phone] [place] - [phone]  Metabolic and Weigh Loss Surgery  Appointments: [place] - [phone]        Neurology  Appointments: [place] - [phone] [place] - [phone]  Neurosurgery  Appointments: [place] - [phone]  Obstetrics & Gynecology  Appointments: [place] - [phone] [place] - [phone]        Pediatrics  Appointments: [place] - [phone] [place] - [phone]  Physiatry  Appointments: [place] -[phone]  Physical Therapy  Appointments: [place] - [phone] [place] - [phone]        Podiatry  Appointments: [place] - [phone] [place] - [phone]  Pulmonology  Appointments: [place] - [phone]  Rheumatology  Appointments: [place] - [phone]        [place] Location: [facility]  [address] , [place]  [address]  [place] Location: [facility] [address] [place], [place]  [address]  [place] Location: [facility] [address] [place], [place]  [address]    

## 2021-01-16 ENCOUNTER — Other Ambulatory Visit
Admission: RE | Admit: 2021-01-16 | Discharge: 2021-01-16 | Disposition: A | Discharge status: Home / Self Care | Payer: Medicare Other | Source: Ambulatory Visit | Attending: Orthopedic Surgery | Admitting: Orthopedic Surgery

## 2021-01-16 NOTE — Patient Instructions (Signed)
Your procedure is scheduled on: Monday January 27, 2021. Report to Day Surgery inside [facility] 2nd floor (stop by Admissions desk first before getting on elevator). To find out your arrival time please call [phone] between 1PM - 3PM on Friday January 24, 2021.  Remember: Instructions that are not followed completely may result in serious medical risk,  up to and including death, or upon the discretion of your surgeon and anesthesiologist your  surgery may need to be rescheduled.     _X__ 1. Do not eat food after midnight the night before your procedure.                 No chewing gum or hard candies. You may drink clear liquids up to 2 hours                 before you are scheduled to arrive for your surgery- DO not drink clear                 liquids within 2 hours of the start of your surgery.                 Clear Liquids include:  water, apple juice without pulp, clear Gatorade, G2 or                  Gatorade Zero (avoid Red/Purple/Blue), Black Coffee or Tea (Do not add                 anything to coffee or tea).  ___X_2.   Complete the "Ensure Clear Pre-surgery Clear Carbohydrate Drink" provided to you, 2 hours before arrival. **If you are diabetic you will be provided with an alternative drink, Gatorade Zero or G2.  __X__3.  On the morning of surgery brush your teeth with toothpaste and water, you                may rinse your mouth with mouthwash if you wish.  Do not swallow any toothpaste of mouthwash.     _X__ 4.  No Alcohol for 24 hours before or after surgery.   _X__ 5.  Do Not Smoke or use e-cigarettes For 24 Hours Prior to Your Surgery.                 Do not use any chewable tobacco products for at least 6 hours prior to                 Surgery.  _X__  6.  Do not use any recreational drugs (marijuana, cocaine, heroin, ecstasy, MDMA or other)                For at least one week prior to your surgery.  Combination of these drugs with  anesthesia                May have life threatening results.  __X_ 7.  Notify your doctor if there is any change in your medical condition      (cold, fever, infections).     Do not wear jewelry, make-up, hairpins, clips or nail polish. Do not wear lotions, powders, or perfumes. You may wear deodorant. Do not shave 48 hours prior to surgery. Men may shave face and neck. Do not bring valuables to the hospital.    [facility] is not responsible for any belongings or valuables.  Contacts, dentures or bridgework may not be worn into surgery. Leave your suitcase in the car. After surgery  it may be brought to your room. For patients admitted to the hospital, discharge time is determined by your treatment team.   Patients discharged the day of surgery will not be allowed to drive home.   Make arrangements for someone to be with you for the first 24 hours of your Same Day Discharge.    Please read over the following fact sheets that you were given:   Total Joint Packet    __X__ Take these medicines the morning of surgery with A SIP OF WATER:    1. gabapentin (NEURONTIN) 800 MG   2. venlafaxine XR (EFFEXOR-XR) 75 MG   3. HYDROcodone-acetaminophen (NORCO) 10-325 MG if needed  4.  5.  6.  ____ Fleet Enema (as directed)   ____ Use CHG Soap (or wipes) as directed  ____ Use Benzoyl Peroxide Gel as instructed  ____ Use inhalers on the day of surgery  __X__ Stop metFORMIN (GLUCOPHAGE-XR) 500 MG  2 days prior to surgery (Friday 3/25 will be last dose)    ____ Take 1/2 of usual insulin dose the night before surgery. No insulin the morning          of surgery.   ____ Call your PCP, Cardiologist or pulmonologist if you are taking aspirin/Plavix/Coumadin or a blood thinner ask when to stop it before your surgery.  __X__ Stop Anti-inflammatories such asmeloxicam (MOBIC), Ibuprofen, Aleve, Advil, naproxen and BC powders.   __X__ Stop supplements until after surgery. ascorbic acid  (VITAMIN C) 500 MG , Melatonin ([facility] MELATONIN) 10 MG  __X__ Do not start any herbal supplements before your procedure.    If you have any questions regarding your pre-procedure instructions,  Please call Pre-admit Testing at [phone].

## 2021-01-23 ENCOUNTER — Other Ambulatory Visit: Payer: Medicare Other

## 2021-04-06 DIAGNOSIS — Z79891 Long term (current) use of opiate analgesic: Secondary | ICD-10-CM | POA: Insufficient documentation

## 2021-04-06 NOTE — Progress Notes (Signed)
PROVIDER NOTE: Information contained herein reflects review and annotations entered in association with encounter. Interpretation of such information and data should be left to medically-trained personnel. Information provided to patient can be located elsewhere in the medical record under "Patient Instructions". Document created using STT-dictation technology, any transcriptional errors that may result from process are unintentional.    Patient: Stephanie [name]  Service Category: E/M  Provider: Gaspar Cola, MD  DOB: 05/14/1961  DOS: 04/07/2021  Specialty: Interventional Pain Management  MRN: [id number]  Setting: Ambulatory outpatient  PCP: Toni Arthurs, NP  Type: Established Patient    Referring Provider: Toni Arthurs, NP  Location: Office  Delivery: Face-to-face     HPI  [name], a 60 y.o. year old female, is here today because of her Chronic pain syndrome [G89.4]. Stephanie [name] primary complain today is Back Pain (low) Last encounter: My last encounter with her was on 12/30/2020. 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); Musculoskeletal pain; Neurogenic pain; Spasm of paraspinal muscle; Chronic hip pain (Bilateral) (R>L); Osteoarthritis of knee (Bilateral); Osteoarthritis of hip (Bilateral); Osteoarthritis of sacroiliac joint (Bilateral); Chronic sacroiliac joint pain (Bilateral); Chronic pain syndrome; Failed back surgical syndrome; Eczema of  hands (Bilateral) (R>L); Abnormal MRI, knee (04/04/2018); 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 Pain Assessment: Severity of Chronic pain is reported as a 5 /10. Location: Back Lower/anterior legs mostly, worse on the right. Onset: More than a month ago. Quality: Constant,Sharp,Pins and needles. Timing: Constant. Modifying factor(s): medications, walking, heat. Vitals:  height is _0  (1.702 m) and weight is 189 lb (85.7 kg). Her temporal temperature is 97.1 F (36.2 C) (abnormal). Her blood pressure is 142/74 (abnormal) and her pulse is 76. Her respiration is 18 and oxygen saturation is 97%.   Reason for encounter: medication management.   The patient indicates doing well with the current medication regimen. No adverse reactions or side effects reported to the medications.   Pending right knee surgery by Dr. Marry Guan on 04/23/2021.  Postop pain management handout given to patient.  RTCB: 07/08/2021 Nonopioids transferred 10/07/2020: Mobic, Neurontin, Flexeril, melatonin, and triamcinolone ointment.  Pharmacotherapy Assessment   Analgesic: Hydrocodone/acetaminophen 10/325 mg, 1 tab PO BID (20 mg/day of hydrocodone) MME/day:8m/day.   Monitoring: [place] PMP: PDMP reviewed during this encounter.       Pharmacotherapy: No side-effects or adverse reactions reported. Compliance: No problems identified. Effectiveness: Clinically acceptable.  SHart Rochester RN  04/07/2021  8:19 AM  Signed Nursing Pain Medication Assessment:  Safety precautions to be maintained throughout the outpatient stay will include: orient to surroundings, keep bed in low position, maintain call bell within reach at all times, provide assistance with  transfer out of bed and ambulation.  Medication Inspection Compliance: Pill count conducted under aseptic conditions, in front of the patient. Neither the pills nor the bottle was removed from the patient's sight at  any time. Once count was completed pills were immediately returned to the patient in their original bottle.  Medication: Hydrocodone/APAP Pill/Patch Count: 3 of 45 pills remain Pill/Patch Appearance: Markings consistent with prescribed medication Bottle Appearance: Standard pharmacy container. Clearly labeled. Filled Date: 05 / 09 / 2022 Last Medication intake:  Today    UDS:  Summary  Date Value Ref Range Status  02/27/2020 Note  Final    Comment:    ==================================================================== [employer] Select 13 (MW) ==================================================================== Test                             Result       Flag       Units Drug Present and Declared for Prescription Verification   Hydrocodone                    1404         EXPECTED   ng/mg creat   Hydromorphone                  241          EXPECTED   ng/mg creat   Dihydrocodeine                 99           EXPECTED   ng/mg creat   Norhydrocodone                 >1866        EXPECTED   ng/mg creat    Sources of hydrocodone include scheduled prescription medications.    Hydromorphone, dihydrocodeine and norhydrocodone are expected    metabolites of hydrocodone. Hydromorphone and dihydrocodeine are    also available as scheduled prescription medications. ==================================================================== Test                      Result    Flag   Units      Ref Range   Creatinine              268              mg/dL      >=20 ==================================================================== Declared Medications:  The flagging and interpretation on this report are based on the  following declared medications.  Unexpected results may arise from   inaccuracies in the declared medications.  **Note: The testing scope of this panel includes these medications:  Hydrocodone  **Note: The testing scope of this panel does not include the  following reported medications:  Acetaminophen  Cyclobenzaprine  Gabapentin  Melatonin  Meloxicam  Triamcinolone (Kenalog) ==================================================================== For clinical consultation, please call [phone]. ====================================================================      ROS  Constitutional: Denies any fever or chills Gastrointestinal: No reported hemesis, hematochezia, vomiting, or acute GI distress Musculoskeletal: Denies any acute onset joint swelling, redness, loss of ROM, or weakness Neurological: No reported episodes of acute onset apraxia, aphasia, dysarthria, agnosia, amnesia, paralysis, loss of coordination, or loss of consciousness  Medication Review  Cholecalciferol, HYDROcodone-acetaminophen, Melatonin, ascorbic acid, cyanocobalamin, cyclobenzaprine, gabapentin, meloxicam, metFORMIN, and triamcinolone ointment  History Review  Allergy: Ms. Felling is allergic to phenergan [promethazine hcl]. Drug: Ms. Vinton  reports no history of drug use. Alcohol:  reports no history of alcohol use. Tobacco:  reports that she has never smoked. She has never used smokeless tobacco. Social: Ms. Furth  reports that she has never smoked. She has never used  smokeless tobacco. She reports that she does not drink alcohol and does not use drugs. Medical:  has a past medical history of Allergy, Anemia, iron deficiency (04/09/2014), Arthritis, Atypical chest pain (04/09/2014), CD (contact dermatitis) (03/01/2014), Chest pain (04/09/2014), Diabetes mellitus without complication ([place]), and Lumbar radicular pain (08/22/2015). Surgical: Ms. Oconnor  has a past surgical history that includes Back surgery; Appendectomy; Cholecystectomy; Cesarean section; and  Bunionectomy. Family: family history includes Asthma in her father; Stroke in her mother.  Laboratory Chemistry Profile   Renal Lab Results  Component Value Date   BUN 10 11/13/2020   CREATININE 0.44 11/13/2020   GFRAA >60 09/01/2019   GFRNONAA >60 11/13/2020     Hepatic Lab Results  Component Value Date   AST 15 09/01/2019   ALT 13 09/01/2019   ALBUMIN 4.2 09/01/2019   ALKPHOS 61 09/01/2019     Electrolytes Lab Results  Component Value Date   NA 137 11/13/2020   K 3.9 11/13/2020   CL 103 11/13/2020   CALCIUM 8.8 (L) 11/13/2020   MG 1.9 09/01/2019     Bone Lab Results  Component Value Date   VD25OH 33.18 09/01/2019   25OHVITD1 34 05/07/2016   25OHVITD2 <1.0 05/07/2016   25OHVITD3 34 05/07/2016     Inflammation (CRP: Acute Phase) (ESR: Chronic Phase) Lab Results  Component Value Date   CRP <0.8 09/01/2019   ESRSEDRATE 18 09/01/2019       Note: Above Lab results reviewed.  Recent Imaging Review  DG Chest 2 View CLINICAL DATA:  Chest pain.  EXAM: CHEST - 2 VIEW  COMPARISON:  Chest x-ray 04/09/2014.  FINDINGS: Mediastinum and hilar structures normal. Heart size normal. Mild left base atelectasis and or scarring again noted. No interim change. No acute infiltrate. No pleural effusion or pneumothorax. No acute bony abnormality. Degenerative change thoracic spine.  IMPRESSION: Mild left base atelectasis and or scarring again noted. No interim change. No acute cardiopulmonary disease identified.  Electronically Signed   By: Marcello Moores  Register   On: 11/13/2020 06:54 Note: Reviewed        Physical Exam  General appearance: Well nourished, well developed, and well hydrated. In no apparent acute distress Mental status: Alert, oriented x 3 (person, place, & time)       Respiratory: No evidence of acute respiratory distress Eyes: PERLA Vitals: BP (!) 142/74   Pulse 76   Temp (!) 97.1 F (36.2 C) (Temporal)   Resp 18   Ht _0  (1.702 m)   Wt 189 lb  (85.7 kg)   LMP  (LMP Unknown)   SpO2 97%   BMI 29.60 kg/m  BMI: Estimated body mass index is 29.6 kg/m as calculated from the following:   Height as of this encounter: _1  (1.702 m).   Weight as of this encounter: 189 lb (85.7 kg). Ideal: Ideal body weight: 61.6 kg (135 lb 12.9 oz) Adjusted ideal body weight: 71.3 kg (157 lb 1.3 oz)  Assessment   Status Diagnosis  Controlled Controlled Controlled 1. Chronic pain syndrome   2. Chronic low back pain (Primary area of Pain) (Bilateral) (L>R)   3. Chronic lumbar radicular pain (Right) (S1)   4. Failed back surgical syndrome   5. Chronic lower extremity pain (Secondary Area of Pain) (Bilateral) (L>R)   6. Chronic pain of both hips   7. Chronic knee pain (Tertiary Area of Pain) (Bilateral) (R>L)   8. Pharmacologic therapy   9. Chronic use of opiate for therapeutic purpose  Updated Problems: No problems updated.  Plan of Care  Problem-specific:  No problem-specific Assessment & Plan notes found for this encounter.  Ms. Inioluwa Boulay has a current medication list which includes the following long-term medication(s): cyclobenzaprine, gabapentin, [START ON 04/09/2021] hydrocodone-acetaminophen, [START ON 05/09/2021] hydrocodone-acetaminophen, [START ON 06/08/2021] hydrocodone-acetaminophen, meloxicam, metformin, triamcinolone ointment, and melatonin.  Pharmacotherapy (Medications Ordered): Meds ordered this encounter  Medications  . HYDROcodone-acetaminophen (NORCO) 10-325 MG tablet    Sig: Take 1 tablet by mouth every 12 (twelve) hours as needed for severe pain. Must last 30 days    Dispense:  45 tablet    Refill:  0    Not a duplicate. Do NOT delete! Dispense 1 day early if closed on refill date. Avoid benzodiazepines within 8 hours of opioids. Do not send refill requests.  Marland Kitchen HYDROcodone-acetaminophen (NORCO) 10-325 MG tablet    Sig: Take 1 tablet by mouth every 12 (twelve) hours as needed for severe pain. Must last 30  days    Dispense:  45 tablet    Refill:  0    Not a duplicate. Do NOT delete! Dispense 1 day early if closed on refill date. Avoid benzodiazepines within 8 hours of opioids. Do not send refill requests.  Marland Kitchen HYDROcodone-acetaminophen (NORCO) 10-325 MG tablet    Sig: Take 1 tablet by mouth every 12 (twelve) hours as needed for severe pain. Must last 30 days    Dispense:  45 tablet    Refill:  0    Not a duplicate. Do NOT delete! Dispense 1 day early if closed on refill date. Avoid benzodiazepines within 8 hours of opioids. Do not send refill requests.   Orders:  Orders Placed This Encounter  Procedures  . [employer] Select 13 (MW), Urine    Volume: 30 ml(s). Minimum 3 ml of urine is needed. Document temperature of fresh sample. Indications: Long term (current) use of opiate analgesic (O13.086)    Order Specific Question:   Release to patient    Answer:   Immediate   Follow-up plan:   Return in about 3 months (around 07/08/2021) for evaluation day (F2F) (MM).      Interventional therapies: Planned, scheduled, and/or pending:   Diagnostic right-sided caudal ESI #2 under fluoroscopic guidance and IV sedation    Considering:   Diagnostic bilateral lumbar facet block Possible bilateral lumbar facet RFA Diagnostic Caudal ESI Diagnostic Bilateral L4-5 & L5-S1Lumbar TFESI Diagnostic bilateral sacroiliac joint block Possible bilateral sacroiliac joint RFA Diagnostic bilateral hip joint injection Possible bilateral hip joint RFA Diagnostic bilateral knee joint injection Possible series of Hyalgan knee injections Diagnostic bilateral genicular nerve block Possible bilateral genicular nerve RFA.  Possible Racz procedure    Palliative PRN treatment(s):   Palliative Caudal ESI #2 (Last done 02/01/2017) (100/100/80/80)        Recent Visits No visits were found meeting these conditions. Showing recent visits within past 90 days and meeting all other requirements Today's Visits Date Type  Provider Dept  04/07/21 Office Visit Milinda Pointer, MD [facility]  Showing today's visits and meeting all other requirements Future Appointments No visits were found meeting these conditions. Showing future appointments within next 90 days and meeting all other requirements  I discussed the assessment and treatment plan with the patient. The patient was provided an opportunity to ask questions and all were answered. The patient agreed with the plan and demonstrated an understanding of the instructions.  Patient advised to call back or seek an in-person evaluation if the  symptoms or condition worsens.  Duration of encounter: 30 minutes.  Note by: Gaspar Cola, MD Date: 04/07/2021; Time: 8:41 AM

## 2021-04-07 ENCOUNTER — Encounter: Payer: Self-pay | Admitting: Pain Medicine

## 2021-04-07 ENCOUNTER — Other Ambulatory Visit: Payer: Self-pay

## 2021-04-07 ENCOUNTER — Ambulatory Visit: Payer: No Typology Code available for payment source | Attending: Pain Medicine | Admitting: Pain Medicine

## 2021-04-07 VITALS — BP 142/74 | HR 76 | Temp 97.1°F | Resp 18 | Ht 67.0 in | Wt 189.0 lb

## 2021-04-07 DIAGNOSIS — M5416 Radiculopathy, lumbar region: Secondary | ICD-10-CM | POA: Insufficient documentation

## 2021-04-07 DIAGNOSIS — M79604 Pain in right leg: Secondary | ICD-10-CM | POA: Diagnosis present

## 2021-04-07 DIAGNOSIS — G894 Chronic pain syndrome: Secondary | ICD-10-CM | POA: Insufficient documentation

## 2021-04-07 DIAGNOSIS — M25561 Pain in right knee: Secondary | ICD-10-CM | POA: Insufficient documentation

## 2021-04-07 DIAGNOSIS — M961 Postlaminectomy syndrome, not elsewhere classified: Secondary | ICD-10-CM | POA: Diagnosis not present

## 2021-04-07 DIAGNOSIS — M5441 Lumbago with sciatica, right side: Secondary | ICD-10-CM | POA: Insufficient documentation

## 2021-04-07 DIAGNOSIS — M25552 Pain in left hip: Secondary | ICD-10-CM | POA: Diagnosis present

## 2021-04-07 DIAGNOSIS — M79605 Pain in left leg: Secondary | ICD-10-CM | POA: Diagnosis present

## 2021-04-07 DIAGNOSIS — Z79891 Long term (current) use of opiate analgesic: Secondary | ICD-10-CM | POA: Diagnosis present

## 2021-04-07 DIAGNOSIS — M25562 Pain in left knee: Secondary | ICD-10-CM | POA: Insufficient documentation

## 2021-04-07 DIAGNOSIS — G8929 Other chronic pain: Secondary | ICD-10-CM | POA: Diagnosis present

## 2021-04-07 DIAGNOSIS — Z79899 Other long term (current) drug therapy: Secondary | ICD-10-CM | POA: Diagnosis present

## 2021-04-07 DIAGNOSIS — M5442 Lumbago with sciatica, left side: Secondary | ICD-10-CM | POA: Diagnosis not present

## 2021-04-07 DIAGNOSIS — M25551 Pain in right hip: Secondary | ICD-10-CM | POA: Diagnosis present

## 2021-04-07 MED ORDER — HYDROCODONE-ACETAMINOPHEN 10-325 MG PO TABS: 1.0000 | ORAL_TABLET | Freq: Two times a day (BID) | ORAL | 0 refills | Status: DC | PRN

## 2021-04-07 NOTE — Patient Instructions (Signed)
____________________________________________________________________________________________  Medication Rules  Purpose: To inform patients, and their family members, of our rules and regulations.  Applies to: All patients receiving prescriptions (written or electronic).  Pharmacy of record: Pharmacy where electronic prescriptions will be sent. If written prescriptions are taken to a different pharmacy, please inform the nursing staff. The pharmacy listed in the electronic medical record should be the one where you would like electronic prescriptions to be sent.  Electronic prescriptions: In compliance with the [place] Strengthen Opioid Misuse Prevention (STOP) Act of 2017 (Session Law 2017-74/H243), effective November 02, 2018, all controlled substances must be electronically prescribed. Calling prescriptions to the pharmacy will cease to exist.  Prescription refills: Only during scheduled appointments. Applies to all prescriptions.  NOTE: The following applies primarily to controlled substances (Opioid* Pain Medications).   Type of encounter (visit): For patients receiving controlled substances, face-to-face visits are required. (Not an option or up to the patient.)  Patient's responsibilities: 1. Pain Pills: Bring all pain pills to every appointment (except for procedure appointments). 2. Pill Bottles: Bring pills in original pharmacy bottle. Always bring the newest bottle. Bring bottle, even if empty. 3. Medication refills: You are responsible for knowing and keeping track of what medications you take and those you need refilled. The day before your appointment: write a list of all prescriptions that need to be refilled. The day of the appointment: give the list to the admitting nurse. Prescriptions will be written only during appointments. No prescriptions will be written on procedure days. If you forget a medication: it will not be "Called in", "Faxed", or "electronically sent".  You will need to get another appointment to get these prescribed. No early refills. Do not call asking to have your prescription filled early. 4. Prescription Accuracy: You are responsible for carefully inspecting your prescriptions before leaving our office. Have the discharge nurse carefully go over each prescription with you, before taking them home. Make sure that your name is accurately spelled, that your address is correct. Check the name and dose of your medication to make sure it is accurate. Check the number of pills, and the written instructions to make sure they are clear and accurate. Make sure that you are given enough medication to last until your next medication refill appointment. 5. Taking Medication: Take medication as prescribed. When it comes to controlled substances, taking less pills or less frequently than prescribed is permitted and encouraged. Never take more pills than instructed. Never take medication more frequently than prescribed.  6. Inform other Doctors: Always inform, all of your healthcare providers, of all the medications you take. 7. Pain Medication from other Providers: You are not allowed to accept any additional pain medication from any other Doctor or Healthcare provider. There are two exceptions to this rule. (see below) In the event that you require additional pain medication, you are responsible for notifying us, as stated below. 8. Cough Medicine: Often these contain an opioid, such as codeine or hydrocodone. Never accept or take cough medicine containing these opioids if you are already taking an opioid* medication. The combination may cause respiratory failure and death. 9. Medication Agreement: You are responsible for carefully reading and following our Medication Agreement. This must be signed before receiving any prescriptions from our practice. Safely store a copy of your signed Agreement. Violations to the Agreement will result in no further prescriptions.  (Additional copies of our Medication Agreement are available upon request.) 10. Laws, Rules, & Regulations: All patients are expected to follow all   Federal and State Laws, Statutes, Rules, & Regulations. Ignorance of the Laws does not constitute a valid excuse.  11. Illegal drugs and Controlled Substances: The use of illegal substances (including, but not limited to marijuana and its derivatives) and/or the illegal use of any controlled substances is strictly prohibited. Violation of this rule may result in the immediate and permanent discontinuation of any and all prescriptions being written by our practice. The use of any illegal substances is prohibited. 12. Adopted CDC guidelines & recommendations: Target dosing levels will be at or below 60 MME/day. Use of benzodiazepines** is not recommended.  Exceptions: There are only two exceptions to the rule of not receiving pain medications from other Healthcare Providers. 1. Exception #1 (Emergencies): In the event of an emergency (i.e.: accident requiring emergency care), you are allowed to receive additional pain medication. However, you are responsible for: As soon as you are able, call our office [phone], at any time of the day or night, and leave a message stating your name, the date and nature of the emergency, and the name and dose of the medication prescribed. In the event that your call is answered by a member of our staff, make sure to document and save the date, time, and the name of the person that took your information.  2. Exception #2 (Planned Surgery): In the event that you are scheduled by another doctor or dentist to have any type of surgery or procedure, you are allowed (for a period no longer than 30 days), to receive additional pain medication, for the acute post-op pain. However, in this case, you are responsible for picking up a copy of our "Post-op Pain Management for Surgeons" handout, and giving it to your surgeon or dentist. This  document is available at our office, and does not require an appointment to obtain it. Simply go to our office during business hours (Monday-Thursday from 8:00 AM to 4:00 PM) (Friday 8:00 AM to 12:00 Noon) or if you have a scheduled appointment with us, prior to your surgery, and ask for it by name. In addition, you are responsible for: calling our office [phone], at any time of the day or night, and leaving a message stating your name, name of your surgeon, type of surgery, and date of procedure or surgery. Failure to comply with your responsibilities may result in termination of therapy involving the controlled substances.  *Opioid medications include: morphine, codeine, oxycodone, oxymorphone, hydrocodone, hydromorphone, meperidine, tramadol, tapentadol, buprenorphine, fentanyl, methadone. **Benzodiazepine medications include: diazepam (Valium), alprazolam (Xanax), clonazepam (Klonopine), lorazepam (Ativan), clorazepate (Tranxene), chlordiazepoxide (Librium), estazolam (Prosom), oxazepam (Serax), temazepam (Restoril), triazolam (Halcion) (Last updated: 09/30/2020) ____________________________________________________________________________________________   ____________________________________________________________________________________________  Medication Recommendations and Reminders  Applies to: All patients receiving prescriptions (written and/or electronic).  Medication Rules & Regulations: These rules and regulations exist for your safety and that of others. They are not flexible and neither are we. Dismissing or ignoring them will be considered "non-compliance" with medication therapy, resulting in complete and irreversible termination of such therapy. (See document titled "Medication Rules" for more details.) In all conscience, because of safety reasons, we cannot continue providing a therapy where the patient does not follow instructions.  Pharmacy of record:   Definition:  This is the pharmacy where your electronic prescriptions will be sent.   We do not endorse any particular pharmacy, however, we have experienced problems with [facility] not securing enough medication supply for the community.  We do not restrict you in your choice of pharmacy. However,   once we write for your prescriptions, we will NOT be re-sending more prescriptions to fix restricted supply problems created by your pharmacy, or your insurance.   The pharmacy listed in the electronic medical record should be the one where you want electronic prescriptions to be sent.  If you choose to change pharmacy, simply notify our nursing staff.  Recommendations:  Keep all of your pain medications in a safe place, under lock and key, even if you live alone. We will NOT replace lost, stolen, or damaged medication.  After you fill your prescription, take 1 week's worth of pills and put them away in a safe place. You should keep a separate, properly labeled bottle for this purpose. The remainder should be kept in the original bottle. Use this as your primary supply, until it runs out. Once it's gone, then you know that you have 1 week's worth of medicine, and it is time to come in for a prescription refill. If you do this correctly, it is unlikely that you will ever run out of medicine.  To make sure that the above recommendation works, it is very important that you make sure your medication refill appointments are scheduled at least 1 week before you run out of medicine. To do this in an effective manner, make sure that you do not leave the office without scheduling your next medication management appointment. Always ask the nursing staff to show you in your prescription , when your medication will be running out. Then arrange for the receptionist to get you a return appointment, at least 7 days before you run out of medicine. Do not wait until you have 1 or 2 pills left, to come in. This is very poor planning and  does not take into consideration that we may need to cancel appointments due to bad weather, sickness, or emergencies affecting our staff.  DO NOT ACCEPT A "Partial Fill": If for any reason your pharmacy does not have enough pills/tablets to completely fill or refill your prescription, do not allow for a "partial fill". The law allows the pharmacy to complete that prescription within 72 hours, without requiring a new prescription. If they do not fill the rest of your prescription within those 72 hours, you will need a separate prescription to fill the remaining amount, which we will NOT provide. If the reason for the partial fill is your insurance, you will need to talk to the pharmacist about payment alternatives for the remaining tablets, but again, DO NOT ACCEPT A PARTIAL FILL, unless you can trust your pharmacist to obtain the remainder of the pills within 72 hours.  Prescription refills and/or changes in medication(s):   Prescription refills, and/or changes in dose or medication, will be conducted only during scheduled medication management appointments. (Applies to both, written and electronic prescriptions.)  No refills on procedure days. No medication will be changed or started on procedure days. No changes, adjustments, and/or refills will be conducted on a procedure day. Doing so will interfere with the diagnostic portion of the procedure.  No phone refills. No medications will be "called into the pharmacy".  No Fax refills.  No weekend refills.  No Holliday refills.  No after hours refills.  Remember:  Business hours are:  Monday to Thursday 8:00 AM to 4:00 PM Provider's Schedule: Jasiel Apachito, MD - Appointments are:  Medication management: Monday and Wednesday 8:00 AM to 4:00 PM Procedure day: Tuesday and Thursday 7:30 AM to 4:00 PM Bilal Lateef, MD - Appointments are:    Medication management: Tuesday and Thursday 8:00 AM to 4:00 PM Procedure day: Monday and Wednesday  7:30 AM to 4:00 PM (Last update: 05/22/2020) ____________________________________________________________________________________________   ____________________________________________________________________________________________  CBD (cannabidiol) WARNING  Applicable to: All individuals currently taking or considering taking CBD (cannabidiol) and, more important, all patients taking opioid analgesic controlled substances (pain medication). (Example: oxycodone; oxymorphone; hydrocodone; hydromorphone; morphine; methadone; tramadol; tapentadol; fentanyl; buprenorphine; butorphanol; dextromethorphan; meperidine; codeine; etc.)  Legal status: CBD remains a Schedule I drug prohibited for any use. CBD is illegal with one exception. In the [place], CBD has a limited Food and Drug Administration (FDA) approval for the treatment of two specific types of epilepsy disorders. Only one CBD product has been approved by the FDA for this purpose: "Epidiolex". FDA is aware that some companies are marketing products containing cannabis and cannabis-derived compounds in ways that violate the Federal Food, Drug and Cosmetic Act (FD&C Act) and that may put the health and safety of consumers at risk. The FDA, a Federal agency, has not enforced the CBD status since 2018.   Legality: Some manufacturers ship CBD products nationally, which is illegal. Often such products are sold online and are therefore available throughout the country. CBD is openly sold in head shops and health food stores in some states where such sales have not been explicitly legalized. Selling unapproved products with unsubstantiated therapeutic claims is not only a violation of the law, but also can put patients at risk, as these products have not been proven to be safe or effective. Federal illegality makes it difficult to conduct research on CBD.  Reference: "FDA Regulation of Cannabis and Cannabis-Derived Products, Including Cannabidiol  (CBD)" - https://www.fda.gov/news-events/public-health-focus/fda-regulation-cannabis-and-cannabis-derived-products-including-cannabidiol-cbd  Warning: CBD is not FDA approved and has not undergo the same manufacturing controls as prescription drugs.  This means that the purity and safety of available CBD may be questionable. Most of the time, despite manufacturer's claims, it is contaminated with THC (delta-9-tetrahydrocannabinol - the chemical in marijuana responsible for the "HIGH").  When this is the case, the THC contaminant will trigger a positive urine drug screen (UDS) test for Marijuana (carboxy-THC). Because a positive UDS for any illicit substance is a violation of our medication agreement, your opioid analgesics (pain medicine) may be permanently discontinued.  MORE ABOUT CBD  General Information: CBD  is a derivative of the Marijuana (cannabis sativa) plant discovered in 1940. It is one of the 113 identified substances found in Marijuana. It accounts for up to 40% of the plant's extract. As of 2018, preliminary clinical studies on CBD included research for the treatment of anxiety, movement disorders, and pain. CBD is available and consumed in multiple forms, including inhalation of smoke or vapor, as an aerosol spray, and by mouth. It may be supplied as an oil containing CBD, capsules, dried cannabis, or as a liquid solution. CBD is thought not to be as psychoactive as THC (delta-9-tetrahydrocannabinol - the chemical in marijuana responsible for the "HIGH"). Studies suggest that CBD may interact with different biological target receptors in the body, including cannabinoid and other neurotransmitter receptors. As of 2018 the mechanism of action for its biological effects has not been determined.  Side-effects  Adverse reactions: Dry mouth, diarrhea, decreased appetite, fatigue, drowsiness, malaise, weakness, sleep disturbances, and others.  Drug interactions: CBC may interact with other  medications such as blood-thinners. (Last update: 06/08/2020) ____________________________________________________________________________________________   ____________________________________________________________________________________________  Drug Holidays (Slow)  What is a "Drug Holiday"? Drug Holiday: is the name given to the period of time during   which a patient stops taking a medication(s) for the purpose of eliminating tolerance to the drug.  Benefits . Improved effectiveness of opioids. . Decreased opioid dose needed to achieve benefits. . Improved pain with lesser dose.  What is tolerance? Tolerance: is the progressive decreased in effectiveness of a drug due to its repetitive use. With repetitive use, the body gets use to the medication and as a consequence, it loses its effectiveness. This is a common problem seen with opioid pain medications. As a result, a larger dose of the drug is needed to achieve the same effect that used to be obtained with a smaller dose.  How long should a "Drug Holiday" last? You should stay off of the pain medicine for at least 14 consecutive days. (2 weeks)  Should I stop the medicine "cold turkey"? No. You should always coordinate with your Pain Specialist so that he/she can provide you with the correct medication dose to make the transition as smoothly as possible.  How do I stop the medicine? Slowly. You will be instructed to decrease the daily amount of pills that you take by one (1) pill every seven (7) days. This is called a "slow downward taper" of your dose. For example: if you normally take four (4) pills per day, you will be asked to drop this dose to three (3) pills per day for seven (7) days, then to two (2) pills per day for seven (7) days, then to one (1) per day for seven (7) days, and at the end of those last seven (7) days, this is when the "Drug Holiday" would start.   Will I have withdrawals? By doing a "slow downward  taper" like this one, it is unlikely that you will experience any significant withdrawal symptoms. Typically, what triggers withdrawals is the sudden stop of a high dose opioid therapy. Withdrawals can usually be avoided by slowly decreasing the dose over a prolonged period of time. If you do not follow these instructions and decide to stop your medication abruptly, withdrawals may be possible.  What are withdrawals? Withdrawals: refers to the wide range of symptoms that occur after stopping or dramatically reducing opiate drugs after heavy and prolonged use. Withdrawal symptoms do not occur to patients that use low dose opioids, or those who take the medication sporadically. Contrary to benzodiazepine (example: Valium, Xanax, etc.) or alcohol withdrawals ("Delirium Tremens"), opioid withdrawals are not lethal. Withdrawals are the physical manifestation of the body getting rid of the excess receptors.  Expected Symptoms Early symptoms of withdrawal may include: . Agitation . Anxiety . Muscle aches . Increased tearing . Insomnia . Runny nose . Sweating . Yawning  Late symptoms of withdrawal may include: . Abdominal cramping . Diarrhea . Dilated pupils . Goose bumps . Nausea . Vomiting  Will I experience withdrawals? Due to the slow nature of the taper, it is very unlikely that you will experience any.  What is a slow taper? Taper: refers to the gradual decrease in dose.  (Last update: 05/22/2020) ____________________________________________________________________________________________     

## 2021-04-07 NOTE — Progress Notes (Signed)
Nursing Pain Medication Assessment:  Safety precautions to be maintained throughout the outpatient stay will include: orient to surroundings, keep bed in low position, maintain call bell within reach at all times, provide assistance with transfer out of bed and ambulation.  Medication Inspection Compliance: Pill count conducted under aseptic conditions, in front of the patient. Neither the pills nor the bottle was removed from the patient's sight at any time. Once count was completed pills were immediately returned to the patient in their original bottle.  Medication: Hydrocodone/APAP Pill/Patch Count: 3 of 45 pills remain Pill/Patch Appearance: Markings consistent with prescribed medication Bottle Appearance: Standard pharmacy container. Clearly labeled. Filled Date: 05 / 09 / 2022 Last Medication intake:  Today

## 2021-04-12 LAB — TOXASSURE SELECT 13 (MW), URINE

## 2021-04-14 ENCOUNTER — Other Ambulatory Visit: Admission: RE | Admit: 2021-04-14 | Payer: Medicare Other | Source: Ambulatory Visit

## 2021-04-21 ENCOUNTER — Other Ambulatory Visit: Admission: RE | Admit: 2021-04-21 | Payer: Medicare Other | Source: Ambulatory Visit

## 2021-04-23 ENCOUNTER — Encounter: Admission: RE | Payer: Self-pay | Source: Home / Self Care

## 2021-04-23 ENCOUNTER — Inpatient Hospital Stay: Admission: RE | Admit: 2021-04-23 | Payer: Medicare Other | Source: Home / Self Care | Admitting: Orthopedic Surgery

## 2021-04-23 SURGERY — ARTHROPLASTY, KNEE, TOTAL, USING IMAGELESS COMPUTER-ASSISTED NAVIGATION
Anesthesia: Choice | Site: Knee | Laterality: Right

## 2021-06-15 NOTE — Progress Notes (Signed)
Patient: Stephanie [name]  Service Category: E/M  Provider: Gaspar Cola, MD  DOB: 11-Apr-1961  DOS: 06/18/2021  Location: Office  MRN: [id number]  Setting: Ambulatory outpatient  Referring Provider: Toni Arthurs, NP  Type: Established Patient  Specialty: Interventional Pain Management  PCP: Toni Arthurs, NP  Location: Remote location  Delivery: TeleHealth     Virtual Encounter - Pain Management PROVIDER NOTE: Information contained herein reflects review and annotations entered in association with encounter. Interpretation of such information and data should be left to medically-trained personnel. Information provided to patient can be located elsewhere in the medical record under "Patient Instructions". Document created using STT-dictation technology, any transcriptional errors that may result from process are unintentional.    Contact & Pharmacy Preferred: [phone] Home: [phone] (home) Mobile: [phone] (mobile) E-mail: [email or web address]  [id number] [place] [id number]: [phone]: [phone]  Pre-screening  Ms. SNicole Kindredoffered "in-person" vs "virtual" encounter. She indicated preferring virtual for this encounter.   Reason COVID-19*  Social distancing based on CDC and AMA recommendations.   I contacted Stephanie [name] 06/18/2021 via telephone.      I clearly identified myself as FGaspar Cola MD. I verified that I was speaking with the correct person using two identifiers (Name: LJaide [name] and date of birth: 809-May-1962.  Consent I sought verbal advanced consent from Stephanie Boastfor virtual visit interactions. I informed Ms. SCarderof possible security and privacy concerns, risks, and limitations associated with providing "not-in-person" medical evaluation and management services. I also informed Ms. SAwwadof the availability of "in-person" appointments.  Finally, I informed her that there would be a charge for the virtual visit and that she could be  personally, fully or partially, financially responsible for it. Ms. SOoexpressed understanding and agreed to proceed.   Historic Elements   Ms. LSoleia Badolatois a 60y.o. year old, female patient evaluated today after our last contact on 04/07/2021. Ms. SIge has a past medical history of Allergy, Anemia, iron deficiency (04/09/2014), Arthritis, Atypical chest pain (04/09/2014), CD (contact dermatitis) (03/01/2014), Chest pain (04/09/2014), Diabetes mellitus without complication ([place], and Lumbar radicular pain (08/22/2015). She also  has a past surgical history that includes Back surgery; Appendectomy; Cholecystectomy; Cesarean section; and Bunionectomy. Ms. SMoneshas a current medication list which includes the following prescription(Stephanie): ascorbic acid, cholecalciferol, cyanocobalamin, cyclobenzaprine, dulaglutide, [START ON 07/14/2021] dulaglutide, [START ON 08/18/2021] dulaglutide, [START ON 09/22/2021] dulaglutide, escitalopram, gabapentin, gabapentin, ibuprofen, melatonin, meloxicam, metformin, triamcinolone ointment, and [START ON 07/08/2021] hydrocodone-acetaminophen. She  reports that she has never smoked. She has never used smokeless tobacco. She reports that she does not drink alcohol and does not use drugs. Ms. SParchmentis allergic to phenergan [promethazine hcl].   HPI  Today, she is being contacted for medication management.  The patient indicates doing well with the current medication regimen. No adverse reactions or side effects reported to the medications.   RTCB: 08/07/2021 Nonopioids transferred 10/07/2020: Mobic, Neurontin, Flexeril, melatonin, and triamcinolone ointment.  Pharmacotherapy Assessment   Analgesic: Hydrocodone/acetaminophen 10/325 mg, 1 tab PO BID (20 mg/day of hydrocodone) MME/day: 20 mg/day.   Monitoring: [place] PMP: PDMP reviewed during this encounter.        Pharmacotherapy: No side-effects or adverse reactions reported. Compliance: No problems identified. Effectiveness: Clinically acceptable. Plan: Refer to "POC". UDS:  Summary  Date Value Ref Range Status  04/07/2021 Note  Final  Comment:    ==================================================================== [employer] Select 13 (MW) ==================================================================== Test                             Result       Flag       Units  Drug Present and Declared for Prescription Verification   Hydrocodone                    1923         EXPECTED   ng/mg creat   Hydromorphone                  186          EXPECTED   ng/mg creat   Dihydrocodeine                 134          EXPECTED   ng/mg creat   Norhydrocodone                 1927         EXPECTED   ng/mg creat    Sources of hydrocodone include scheduled prescription medications.    Hydromorphone, dihydrocodeine and norhydrocodone are expected    metabolites of hydrocodone. Hydromorphone and dihydrocodeine are    also available as scheduled prescription medications.  ==================================================================== Test                      Result    Flag   Units      Ref Range   Creatinine              125              mg/dL      >=20 ==================================================================== Declared Medications:  The flagging and interpretation on this report are based on the  following declared medications.  Unexpected results may arise from  inaccuracies in the declared medications.   **Note: The testing scope of this panel includes these medications:   Hydrocodone (Norco)   **Note: The testing scope of this panel does not include the  following reported medications:   Acetaminophen (Norco)  Cholecalciferol  Cyclobenzaprine (Flexeril)  Gabapentin (Neurontin)  Lurasidone (Latuda)  Melatonin  Meloxicam (Mobic)  Metformin  Triamcinolone (Kenalog)   Venlafaxine (Effexor)  Vitamin C ==================================================================== For clinical consultation, please call [phone]. ====================================================================      Laboratory Chemistry Profile   Renal Lab Results  Component Value Date   BUN 10 11/13/2020   CREATININE 0.44 11/13/2020   GFRAA >60 09/01/2019   GFRNONAA >60 11/13/2020    Hepatic Lab Results  Component Value Date   AST 15 09/01/2019   ALT 13 09/01/2019   ALBUMIN 4.2 09/01/2019   ALKPHOS 61 09/01/2019    Electrolytes Lab Results  Component Value Date   NA 137 11/13/2020   K 3.9 11/13/2020   CL 103 11/13/2020   CALCIUM 8.8 (Stephanie) 11/13/2020   MG 1.9 09/01/2019    Bone Lab Results  Component Value Date   VD25OH 33.18 09/01/2019   25OHVITD1 34 05/07/2016   25OHVITD2 <1.0 05/07/2016   25OHVITD3 34 05/07/2016    Inflammation (CRP: Acute Phase) (ESR: Chronic Phase) Lab Results  Component Value Date   CRP <0.8 09/01/2019   ESRSEDRATE 18 09/01/2019         Note: Above Lab results reviewed.  Imaging  DG Chest 2 View CLINICAL DATA:  Chest pain.  EXAM: CHEST - 2 VIEW  COMPARISON:  Chest x-ray 04/09/2014.  FINDINGS: Mediastinum and hilar structures normal. Heart size normal. Mild left base atelectasis and or scarring again noted. No interim change. No acute infiltrate. No pleural effusion or pneumothorax. No acute bony abnormality. Degenerative change thoracic spine.  IMPRESSION: Mild left base atelectasis and or scarring again noted. No interim change. No acute cardiopulmonary disease identified.  Electronically Signed   By: Marcello Moores  Register   On: 11/13/2020 06:54  Assessment  The primary encounter diagnosis was Chronic pain syndrome. Diagnoses of Chronic low back pain (Primary area of Pain) (Bilateral) (Stephanie>R), Chronic lumbar radicular pain (Right) (S1), Failed back surgical syndrome, Chronic lower extremity pain (Secondary Area  of Pain) (Bilateral) (Stephanie>R), Chronic knee pain (Tertiary Area of Pain) (Bilateral) (R>Stephanie), Pharmacologic therapy, Chronic use of opiate for therapeutic purpose, and Encounter for medication management were also pertinent to this visit.  Plan of Care  Problem-specific:  No problem-specific Assessment & Plan notes found for this encounter.  Ms. Yolani Vo has a current medication list which includes the following long-term medication(Stephanie): cyclobenzaprine, escitalopram, gabapentin, gabapentin, melatonin, meloxicam, metformin, triamcinolone ointment, and [START ON 07/08/2021] hydrocodone-acetaminophen.  Pharmacotherapy (Medications Ordered): Meds ordered this encounter  Medications   HYDROcodone-acetaminophen (NORCO) 10-325 MG tablet    Sig: Take 1 tablet by mouth every 12 (twelve) hours as needed for severe pain. Must last 30 days    Dispense:  45 tablet    Refill:  0    Not a duplicate. Do NOT delete! Dispense 1 day early if closed on refill date. Avoid benzodiazepines within 8 hours of opioids. Do not send refill requests.    Orders:  No orders of the defined types were placed in this encounter.  Follow-up plan:   Return in about 7 weeks (around 08/07/2021) for Eval-day(M,W), (F2F), (MM).     Interventional therapies: Planned, scheduled, and/or pending:   Diagnostic right-sided caudal ESI #2 under fluoroscopic guidance and IV sedation    Considering:   Diagnostic bilateral lumbar facet block Possible bilateral lumbar facet RFA Diagnostic Caudal ESI Diagnostic Bilateral L4-5 & L5-S1Lumbar TFESI Diagnostic bilateral sacroiliac joint block Possible bilateral sacroiliac joint RFA Diagnostic bilateral hip joint injection Possible bilateral hip joint RFA Diagnostic bilateral knee joint injection Possible series of Hyalgan knee injections Diagnostic bilateral genicular nerve block Possible bilateral genicular nerve RFA.  Possible Racz procedure    Palliative PRN treatment(Stephanie):    Palliative Caudal ESI #2 (Last done 02/01/2017) (100/100/80/80)         Recent Visits Date Type Provider Dept  04/07/21 Office Visit Milinda Pointer, MD [facility]  Showing recent visits within past 90 days and meeting all other requirements Today'Stephanie Visits Date Type Provider Dept  06/18/21 Telemedicine Milinda Pointer, MD [facility]  Showing today'Stephanie visits and meeting all other requirements Future Appointments Date Type Provider Dept  08/04/21 Appointment Milinda Pointer, MD [facility]  Showing future appointments within next 90 days and meeting all other requirements I discussed the assessment and treatment plan with the patient. The patient was provided an opportunity to ask questions and all were answered. The patient agreed with the plan and demonstrated an understanding of the instructions.  Patient advised to call back or seek an in-person evaluation if the symptoms or condition worsens.  Duration of encounter: 12 minutes.  Note by: Gaspar Cola, MD Date: 06/18/2021; Time: 4:49 PM

## 2021-06-17 ENCOUNTER — Encounter: Payer: Self-pay | Admitting: Pain Medicine

## 2021-06-18 ENCOUNTER — Ambulatory Visit: Payer: Medicare Other | Attending: Pain Medicine | Admitting: Pain Medicine

## 2021-06-18 ENCOUNTER — Other Ambulatory Visit: Payer: Self-pay

## 2021-06-18 DIAGNOSIS — G894 Chronic pain syndrome: Secondary | ICD-10-CM

## 2021-06-18 DIAGNOSIS — M79604 Pain in right leg: Secondary | ICD-10-CM

## 2021-06-18 DIAGNOSIS — M961 Postlaminectomy syndrome, not elsewhere classified: Secondary | ICD-10-CM | POA: Diagnosis not present

## 2021-06-18 DIAGNOSIS — M5416 Radiculopathy, lumbar region: Secondary | ICD-10-CM

## 2021-06-18 DIAGNOSIS — M5441 Lumbago with sciatica, right side: Secondary | ICD-10-CM

## 2021-06-18 DIAGNOSIS — M5442 Lumbago with sciatica, left side: Secondary | ICD-10-CM | POA: Diagnosis not present

## 2021-06-18 DIAGNOSIS — Z79899 Other long term (current) drug therapy: Secondary | ICD-10-CM

## 2021-06-18 DIAGNOSIS — M25562 Pain in left knee: Secondary | ICD-10-CM

## 2021-06-18 DIAGNOSIS — G8929 Other chronic pain: Secondary | ICD-10-CM

## 2021-06-18 DIAGNOSIS — M79605 Pain in left leg: Secondary | ICD-10-CM

## 2021-06-18 DIAGNOSIS — M25561 Pain in right knee: Secondary | ICD-10-CM

## 2021-06-18 DIAGNOSIS — Z79891 Long term (current) use of opiate analgesic: Secondary | ICD-10-CM

## 2021-06-18 MED ORDER — HYDROCODONE-ACETAMINOPHEN 10-325 MG PO TABS: 1.0000 | ORAL_TABLET | Freq: Two times a day (BID) | ORAL | 0 refills | Status: DC | PRN

## 2021-06-30 ENCOUNTER — Encounter: Payer: Medicare Other | Admitting: Pain Medicine

## 2021-08-01 NOTE — Patient Instructions (Signed)

## 2021-08-01 NOTE — Progress Notes (Signed)
PROVIDER NOTE: Information contained herein reflects review and annotations entered in association with encounter. Interpretation of such information and data should be left to medically-trained personnel. Information provided to patient can be located elsewhere in the medical record under "Patient Instructions". Document created using STT-dictation technology, any transcriptional errors that may result from process are unintentional.    Patient: Stephanie [name]  Service Category: E/M  Provider: Gaspar Cola, MD  DOB: 1960-11-05  DOS: 08/04/2021  Specialty: Interventional Pain Management  MRN: [id number]  Setting: Ambulatory outpatient  PCP: Stephanie Arthurs, NP  Type: Established Patient    Referring Provider: Toni Arthurs, NP  Location: Office  Delivery: Face-to-face     HPI  [name], a 60 y.o. year old female, is here today because of her Chronic bilateral low back pain with bilateral sciatica [M54.42, M54.41, G89.29]. [name] primary complain today is Back Pain (Lower/) Last encounter: My last encounter with her was on 04/07/2021. Pertinent problems: 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); Musculoskeletal pain; Neurogenic pain; Spasm of paraspinal muscle; Chronic hip pain (Bilateral) (R>L); Osteoarthritis of knee (Bilateral); Osteoarthritis of hip (Bilateral); Osteoarthritis of sacroiliac joint (Bilateral); Chronic sacroiliac joint pain (Bilateral); Chronic pain syndrome; Failed back surgical  syndrome; Eczema of hands (Bilateral) (R>L); Abnormal MRI, knee (04/04/2018); 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 Pain Assessment: Severity of Chronic pain is reported as a 5 /10. Location: Back Left, Right/pain radiaties down both legs at times. Onset: More than a month ago. Quality: Aching, Burning, Shooting. Timing: Constant. Modifying factor(s): meds, heat and hot bath. Vitals:  height is _0  (1.702 m) and weight is 179 lb (81.2 kg). Her temperature is 97.3 F (36.3 C) (abnormal). Her blood pressure is 98/66 and her pulse is 98. Her respiration is 15 and oxygen saturation is 98%.   Reason for encounter: medication management.   The patient indicates doing well with the current medication regimen. No adverse reactions or side effects reported to the medications.   RTCB: 11/05/2021 Nonopioids transferred 10/07/2020: Mobic, Neurontin, Flexeril, melatonin, and triamcinolone ointment.  Pharmacotherapy Assessment  Analgesic: Hydrocodone/acetaminophen 10/325 mg, 1 tab PO BID (20 mg/day of hydrocodone) MME/day: 20 mg/day.   Monitoring:  PMP: PDMP reviewed during this encounter.       Pharmacotherapy: No side-effects or adverse reactions reported. Compliance: No problems identified. Effectiveness: Clinically acceptable.  Stephanie Fischer, RN  08/04/2021 12:41 PM  Sign when Signing Visit Safety precautions to be maintained throughout the outpatient stay will include: orient to surroundings, keep bed in low position, maintain call bell within reach at all times, provide assistance with transfer out of bed and ambulation.  Nursing Pain Medication Assessment:  Safety precautions to be maintained throughout the outpatient  stay will include: orient to surroundings, keep bed in low position, maintain call bell within reach at all times, provide assistance with transfer out of bed and ambulation.  Medication Inspection Compliance: Pill count conducted under aseptic conditions, in front of the patient. Neither the pills nor the bottle was removed from the patient's sight at any time. Once count was completed pills were immediately returned to the patient in their original bottle.  Medication: Hydrocodone/APAP Pill/Patch Count:  9 of 45 pills remain Pill/Patch Appearance: Markings consistent with prescribed medication Bottle Appearance: Standard pharmacy container. Clearly labeled. Filled Date: 82 / 9 / 2022 Last Medication intake:  Today      UDS:  Summary  Date Value Ref Range Status  04/07/2021 Note  Final    Comment:    ==================================================================== [employer] [facility] 13 (MW) ==================================================================== Test                             Result       Flag       Units  Drug Present and Declared for Prescription Verification   Hydrocodone                    1923         EXPECTED   ng/mg creat   Hydromorphone                  186          EXPECTED   ng/mg creat   Dihydrocodeine                 134          EXPECTED   ng/mg creat   Norhydrocodone                 1927         EXPECTED   ng/mg creat    Sources of hydrocodone include scheduled prescription medications.    Hydromorphone, dihydrocodeine and norhydrocodone are expected    metabolites of hydrocodone. Hydromorphone and dihydrocodeine are    also available as scheduled prescription medications.  ==================================================================== Test                      Result    Flag   Units      Ref Range   Creatinine              125              mg/dL      >=20 ==================================================================== Declared  Medications:  The flagging and interpretation on this report are based on the  following declared medications.  Unexpected results may arise from  inaccuracies in the declared medications.   **Note: The testing scope of this panel includes these medications:   Hydrocodone (Norco)   **Note: The testing scope of this panel does not include the  following reported medications:   Acetaminophen (Norco)  Cholecalciferol  Cyclobenzaprine (Flexeril)  Gabapentin (Neurontin)  Lurasidone (Latuda)  Melatonin  Meloxicam (Mobic)  Metformin  Triamcinolone (Kenalog)  Venlafaxine (Effexor)  Vitamin C ==================================================================== For clinical consultation, please call [phone]. ====================================================================      ROS  Constitutional: Denies any fever or chills Gastrointestinal: No reported hemesis, hematochezia, vomiting, or acute GI distress Musculoskeletal: Denies any acute onset joint swelling, redness, loss of ROM, or weakness Neurological: No reported episodes of acute onset apraxia, aphasia, dysarthria, agnosia, amnesia, paralysis, loss of coordination, or loss of consciousness  Medication Review  Cholecalciferol, Dulaglutide, HYDROcodone-acetaminophen, Melatonin, ascorbic acid, cyanocobalamin, cyclobenzaprine, escitalopram, gabapentin, ibuprofen, meloxicam, metFORMIN, and triamcinolone ointment  History  Review  Allergy: Ms. Victor is allergic to phenergan [promethazine hcl]. Drug: Ms. Hukill  reports no history of drug use. Alcohol:  reports no history of alcohol use. Tobacco:  reports that she has never smoked. She has never used smokeless tobacco. Social: Ms. Mckiernan  reports that she has never smoked. She has never used smokeless tobacco. She reports that she does not drink alcohol and does not use drugs. Medical:  has a past medical history of Allergy, Anemia, iron deficiency (04/09/2014),  Arthritis, Atypical chest pain (04/09/2014), CD (contact dermatitis) (03/01/2014), Chest pain (04/09/2014), Diabetes mellitus without complication ([place]), and Lumbar radicular pain (08/22/2015). Surgical: Ms. Ciccone  has a past surgical history that includes Back surgery; Appendectomy; Cholecystectomy; Cesarean section; and Bunionectomy. Family: family history includes Asthma in her father; Stroke in her mother.  Laboratory Chemistry Profile   Renal Lab Results  Component Value Date   BUN 10 11/13/2020   CREATININE 0.44 11/13/2020   GFRAA >60 09/01/2019   GFRNONAA >60 11/13/2020    Hepatic Lab Results  Component Value Date   AST 15 09/01/2019   ALT 13 09/01/2019   ALBUMIN 4.2 09/01/2019   ALKPHOS 61 09/01/2019    Electrolytes Lab Results  Component Value Date   NA 137 11/13/2020   K 3.9 11/13/2020   CL 103 11/13/2020   CALCIUM 8.8 (L) 11/13/2020   MG 1.9 09/01/2019    Bone Lab Results  Component Value Date   VD25OH 33.18 09/01/2019   25OHVITD1 34 05/07/2016   25OHVITD2 <1.0 05/07/2016   25OHVITD3 34 05/07/2016    Inflammation (CRP: Acute Phase) (ESR: Chronic Phase) Lab Results  Component Value Date   CRP <0.8 09/01/2019   ESRSEDRATE 18 09/01/2019         Note: Above Lab results reviewed.  Recent Imaging Review  DG Chest 2 View CLINICAL DATA:  Chest pain.  EXAM: CHEST - 2 VIEW  COMPARISON:  Chest x-ray 04/09/2014.  FINDINGS: Mediastinum and hilar structures normal. Heart size normal. Mild left base atelectasis and or scarring again noted. No interim change. No acute infiltrate. No pleural effusion or pneumothorax. No acute bony abnormality. Degenerative change thoracic spine.  IMPRESSION: Mild left base atelectasis and or scarring again noted. No interim change. No acute cardiopulmonary disease identified.  Electronically Signed   By: Marcello Moores  Register   On: 11/13/2020 06:54 Note: Reviewed        Physical Exam  General appearance: Well nourished, well  developed, and well hydrated. In no apparent acute distress Mental status: Alert, oriented x 3 (person, place, & time)       Respiratory: No evidence of acute respiratory distress Eyes: PERLA Vitals: BP 98/66   Pulse 98   Temp (!) 97.3 F (36.3 C)   Resp 15   Ht _0  (1.702 m)   Wt 179 lb (81.2 kg)   LMP  (LMP Unknown)   SpO2 98%   BMI 28.04 kg/m  BMI: Estimated body mass index is 28.04 kg/m as calculated from the following:   Height as of this encounter: _1  (1.702 m).   Weight as of this encounter: 179 lb (81.2 kg). Ideal: Ideal body weight: 61.6 kg (135 lb 12.9 oz) Adjusted ideal body weight: 69.4 kg (153 lb 1.3 oz)  Assessment   Status Diagnosis  Controlled Controlled Controlled 1. Chronic low back pain (1ry area of Pain) (Bilateral) (L>R)   2. Chronic lower extremity pain (2ry area of Pain) (Bilateral) (L>R)   3. Failed back surgical  syndrome   4. Chronic hip pain (Bilateral) (R>L)   5. Chronic sacroiliac joint pain (Bilateral)   6. Cervicalgia   7. Chronic upper extremity pain (Bilateral)   8. Chronic pain syndrome   9. Pharmacologic therapy   10. Chronic use of opiate for therapeutic purpose   11. Encounter for chronic pain management   12. Encounter for medication management   13. Chronic low back pain (Primary area of Pain) (Bilateral) (L>R)   14. Chronic lumbar radicular pain (Right) (S1)   15. Chronic lower extremity pain (Secondary Area of Pain) (Bilateral) (L>R)   16. Chronic knee pain (Tertiary Area of Pain) (Bilateral) (R>L)      Updated Problems: No problems updated.   Plan of Care  Problem-specific:  No problem-specific Assessment & Plan notes found for this encounter.  Ms. Shaletta Hinostroza has a current medication list which includes the following long-term medication(s): escitalopram, gabapentin, [START ON 08/07/2021] hydrocodone-acetaminophen, [START ON 09/06/2021] hydrocodone-acetaminophen, [START ON 10/06/2021] hydrocodone-acetaminophen,  metformin, cyclobenzaprine, gabapentin, melatonin, meloxicam, and triamcinolone ointment.  Pharmacotherapy (Medications Ordered): Meds ordered this encounter  Medications   HYDROcodone-acetaminophen (NORCO) 10-325 MG tablet    Sig: Take 1 tablet by mouth every 12 (twelve) hours as needed for severe pain. Must last 30 days    Dispense:  45 tablet    Refill:  0    Not a duplicate. Do NOT delete! Dispense 1 day early if closed on fill date. Warn: no CNS-depressants within 8 hrs of opioid. Do not send refill request. Renewal requires appointment. No partial fills allowed   HYDROcodone-acetaminophen (NORCO) 10-325 MG tablet    Sig: Take 1 tablet by mouth every 12 (twelve) hours as needed for severe pain. Must last 30 days    Dispense:  45 tablet    Refill:  0    Not a duplicate. Do NOT delete! Dispense 1 day early if closed on fill date. Warn: no CNS-depressants within 8 hrs of opioid. Do not send refill request. Renewal requires appointment. No partial fills allowed   HYDROcodone-acetaminophen (NORCO) 10-325 MG tablet    Sig: Take 1 tablet by mouth every 12 (twelve) hours as needed for severe pain. Must last 30 days    Dispense:  45 tablet    Refill:  0    Not a duplicate. Do NOT delete! Dispense 1 day early if closed on fill date. Warn: no CNS-depressants within 8 hrs of opioid. Do not send refill request. Renewal requires appointment. No partial fills allowed    Orders:  No orders of the defined types were placed in this encounter.  Follow-up plan:   Return in about 3 months (around 11/05/2021) for Eval-day (M,W), (F2F), (MM).     Interventional therapies: Planned, scheduled, and/or pending:   Diagnostic right-sided caudal ESI #2 under fluoroscopic guidance and IV sedation    Considering:   Diagnostic bilateral lumbar facet block Possible bilateral lumbar facet RFA Diagnostic Caudal ESI Diagnostic Bilateral L4-5 & L5-S1Lumbar TFESI Diagnostic bilateral sacroiliac joint  block Possible bilateral sacroiliac joint RFA Diagnostic bilateral hip joint injection Possible bilateral hip joint RFA Diagnostic bilateral knee joint injection Possible series of Hyalgan knee injections Diagnostic bilateral genicular nerve block Possible bilateral genicular nerve RFA.  Possible Racz procedure    Palliative PRN treatment(s):   Palliative Caudal ESI #2 (Last done 02/01/2017) (100/100/80/80)    Recent Visits Date Type Provider Dept  06/18/21 Telemedicine Milinda Pointer, MD [facility]  Showing recent visits within past 90 days and meeting  all other requirements Today's Visits Date Type Provider Dept  08/04/21 Office Visit Milinda Pointer, MD [facility]  Showing today's visits and meeting all other requirements Future Appointments Date Type Provider Dept  10/20/21 Appointment Milinda Pointer, MD [facility]  Showing future appointments within next 90 days and meeting all other requirements I discussed the assessment and treatment plan with the patient. The patient was provided an opportunity to ask questions and all were answered. The patient agreed with the plan and demonstrated an understanding of the instructions.  Patient advised to call back or seek an in-person evaluation if the symptoms or condition worsens.  Duration of encounter: 30 minutes.  Note by: Stephanie Cola, MD Date: 08/04/2021; Time: 2:19 PM

## 2021-08-04 ENCOUNTER — Encounter: Payer: Self-pay | Admitting: Pain Medicine

## 2021-08-04 ENCOUNTER — Ambulatory Visit: Payer: Medicare Other | Attending: Pain Medicine | Admitting: Pain Medicine

## 2021-08-04 ENCOUNTER — Other Ambulatory Visit: Payer: Self-pay

## 2021-08-04 VITALS — BP 98/66 | HR 98 | Temp 97.3°F | Resp 15 | Ht 67.0 in | Wt 179.0 lb

## 2021-08-04 DIAGNOSIS — Z79891 Long term (current) use of opiate analgesic: Secondary | ICD-10-CM | POA: Insufficient documentation

## 2021-08-04 DIAGNOSIS — M961 Postlaminectomy syndrome, not elsewhere classified: Secondary | ICD-10-CM | POA: Insufficient documentation

## 2021-08-04 DIAGNOSIS — M533 Sacrococcygeal disorders, not elsewhere classified: Secondary | ICD-10-CM | POA: Diagnosis present

## 2021-08-04 DIAGNOSIS — M25561 Pain in right knee: Secondary | ICD-10-CM | POA: Diagnosis present

## 2021-08-04 DIAGNOSIS — M542 Cervicalgia: Secondary | ICD-10-CM | POA: Diagnosis present

## 2021-08-04 DIAGNOSIS — G894 Chronic pain syndrome: Secondary | ICD-10-CM | POA: Insufficient documentation

## 2021-08-04 DIAGNOSIS — M5442 Lumbago with sciatica, left side: Secondary | ICD-10-CM | POA: Diagnosis present

## 2021-08-04 DIAGNOSIS — M79604 Pain in right leg: Secondary | ICD-10-CM | POA: Diagnosis present

## 2021-08-04 DIAGNOSIS — M79605 Pain in left leg: Secondary | ICD-10-CM | POA: Insufficient documentation

## 2021-08-04 DIAGNOSIS — Z79899 Other long term (current) drug therapy: Secondary | ICD-10-CM | POA: Insufficient documentation

## 2021-08-04 DIAGNOSIS — M25551 Pain in right hip: Secondary | ICD-10-CM | POA: Insufficient documentation

## 2021-08-04 DIAGNOSIS — M5441 Lumbago with sciatica, right side: Secondary | ICD-10-CM | POA: Diagnosis present

## 2021-08-04 DIAGNOSIS — M79622 Pain in left upper arm: Secondary | ICD-10-CM | POA: Diagnosis present

## 2021-08-04 DIAGNOSIS — M25562 Pain in left knee: Secondary | ICD-10-CM | POA: Diagnosis present

## 2021-08-04 DIAGNOSIS — M25552 Pain in left hip: Secondary | ICD-10-CM | POA: Insufficient documentation

## 2021-08-04 DIAGNOSIS — M79621 Pain in right upper arm: Secondary | ICD-10-CM | POA: Diagnosis present

## 2021-08-04 DIAGNOSIS — M5416 Radiculopathy, lumbar region: Secondary | ICD-10-CM | POA: Diagnosis present

## 2021-08-04 DIAGNOSIS — G8929 Other chronic pain: Secondary | ICD-10-CM | POA: Insufficient documentation

## 2021-08-04 MED ORDER — HYDROCODONE-ACETAMINOPHEN 10-325 MG PO TABS: 1.0000 | ORAL_TABLET | Freq: Two times a day (BID) | ORAL | 0 refills | Status: DC | PRN

## 2021-08-04 NOTE — Progress Notes (Signed)
Safety precautions to be maintained throughout the outpatient stay will include: orient to surroundings, keep bed in low position, maintain call bell within reach at all times, provide assistance with transfer out of bed and ambulation.  Nursing Pain Medication Assessment:  Safety precautions to be maintained throughout the outpatient stay will include: orient to surroundings, keep bed in low position, maintain call bell within reach at all times, provide assistance with transfer out of bed and ambulation.  Medication Inspection Compliance: Pill count conducted under aseptic conditions, in front of the patient. Neither the pills nor the bottle was removed from the patient's sight at any time. Once count was completed pills were immediately returned to the patient in their original bottle.  Medication: Hydrocodone/APAP Pill/Patch Count:  9 of 45 pills remain Pill/Patch Appearance: Markings consistent with prescribed medication Bottle Appearance: Standard pharmacy container. Clearly labeled. Filled Date: 86 / 9 / 2022 Last Medication intake:  Today

## 2021-08-06 ENCOUNTER — Encounter: Payer: Medicare Other | Admitting: Pain Medicine

## 2021-10-17 NOTE — Progress Notes (Addendum)
PROVIDER NOTE: Information contained herein reflects review and annotations entered in association with encounter. Interpretation of such information and data should be left to medically-trained personnel. Information provided to patient can be located elsewhere in the medical record under "Patient Instructions". Document created using STT-dictation technology, any transcriptional errors that may result from process are unintentional.    Patient: Stephanie [name]  Service Category: E/M  Provider: Gaspar Cola, MD  DOB: February 24, 1961  DOS: 10/20/2021  Specialty: Interventional Pain Management  MRN: [id number]  Setting: Ambulatory outpatient  PCP: Toni Arthurs, NP  Type: Established Patient    Referring Provider: Toni Arthurs, NP  Location: Office  Delivery: Face-to-face     HPI  [name], a 60 y.o. year old female, is here today because of her Chronic bilateral low back pain with bilateral sciatica [M54.42, M54.41, G89.29]. Ms. Mcconathy primary complain today is No chief complaint on file. Last encounter: My last encounter with her was on 08/04/2021. Pertinent problems: Ms. 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); Musculoskeletal pain; Neurogenic pain; Spasm of paraspinal muscle; Chronic hip pain (Bilateral) (R>L); Osteoarthritis of knee (Bilateral); Osteoarthritis of hip (Bilateral); Osteoarthritis of sacroiliac joint (Bilateral); Chronic sacroiliac joint pain (Bilateral); Chronic pain syndrome; Failed  back surgical syndrome; Eczema of hands (Bilateral) (R>L); Abnormal MRI, knee (04/04/2018); 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 Pain Assessment: Severity of Chronic pain is reported as a 5 /10. Location: Back Lower, Left/denies but she continues to have cramping in the inner leg and in the top of her feet.. Onset: More than a month ago. Quality: Cramping, Discomfort, Constant. Timing: Constant. Modifying factor(s): medications help for a while.. Vitals:  height is 5' 7"  (1.702 m) and weight is 174 lb (78.9 kg). Her temporal temperature is 97.3 F (36.3 C) (abnormal). Her blood pressure is 130/79 and her pulse is 86. Her respiration is 16 and oxygen saturation is 100%.   Reason for encounter: medication management.   The patient indicates doing well with the current medication regimen. No adverse reactions or side effects reported to the medications.   RTCB: 02/03/2022 Nonopioids transferred 10/07/2020: Mobic, Neurontin, Flexeril, melatonin, and triamcinolone ointment.  Pharmacotherapy Assessment  Analgesic: Hydrocodone/acetaminophen 10/325 mg, 1 tab PO BID (20 mg/day of hydrocodone) MME/day: 20 mg/day.   Monitoring: [place] PMP: PDMP reviewed during this encounter.       Pharmacotherapy: No side-effects or adverse reactions reported. Compliance: No problems identified. Effectiveness: Clinically acceptable.  Janett Billow, RN  10/20/2021  8:51 AM  Signed Nursing Pain Medication Assessment:  Safety precautions to be maintained throughout the outpatient stay will include: orient to surroundings, keep bed in low position, maintain call bell within reach at all times, provide assistance with transfer out of bed and  ambulation.  Medication Inspection Compliance: Pill count conducted under aseptic conditions, in front of the patient. Neither the pills nor the bottle was removed from the patient's  sight at any time. Once count was completed pills were immediately returned to the patient in their original bottle.  Medication: Hydrocodone/APAP Pill/Patch Count:  31.5 of 45 pills remain Pill/Patch Appearance: Markings consistent with prescribed medication Bottle Appearance: Standard pharmacy container. Clearly labeled. Filled Date: 18 / 09 / 2022 Last Medication intake:  Yesterday    UDS:  Summary  Date Value Ref Range Status  04/07/2021 Note  Final    Comment:    ==================================================================== [employer] Select 13 (MW) ==================================================================== Test                             Result       Flag       Units  Drug Present and Declared for Prescription Verification   Hydrocodone                    1923         EXPECTED   ng/mg creat   Hydromorphone                  186          EXPECTED   ng/mg creat   Dihydrocodeine                 134          EXPECTED   ng/mg creat   Norhydrocodone                 1927         EXPECTED   ng/mg creat    Sources of hydrocodone include scheduled prescription medications.    Hydromorphone, dihydrocodeine and norhydrocodone are expected    metabolites of hydrocodone. Hydromorphone and dihydrocodeine are    also available as scheduled prescription medications.  ==================================================================== Test                      Result    Flag   Units      Ref Range   Creatinine              125              mg/dL      >=20 ==================================================================== Declared Medications:  The flagging and interpretation on this report are based on the  following declared medications.  Unexpected results may arise from  inaccuracies in the  declared medications.   **Note: The testing scope of this panel includes these medications:   Hydrocodone (Norco)   **Note: The testing scope of this panel does not include the  following reported medications:   Acetaminophen (Norco)  Cholecalciferol  Cyclobenzaprine (Flexeril)  Gabapentin (Neurontin)  Lurasidone (Latuda)  Melatonin  Meloxicam (Mobic)  Metformin  Triamcinolone (Kenalog)  Venlafaxine (Effexor)  Vitamin C ==================================================================== For clinical consultation, please call [phone]. ====================================================================      ROS  Constitutional: Denies any fever or chills Gastrointestinal: No reported hemesis, hematochezia, vomiting, or acute GI distress Musculoskeletal: Denies any acute onset joint swelling, redness, loss of ROM, or weakness Neurological: No reported episodes of acute onset apraxia, aphasia, dysarthria, agnosia, amnesia, paralysis, loss of coordination, or loss of consciousness  Medication Review  Cholecalciferol, Dulaglutide, HYDROcodone-acetaminophen, Melatonin, cyanocobalamin, cyclobenzaprine, escitalopram, gabapentin, ibuprofen, meloxicam, metFORMIN, and triamcinolone ointment  History Review  Allergy: Ms. Willig is allergic to phenergan [promethazine hcl]. Drug: Ms. Ruggieri  reports no history of drug use. Alcohol:  reports no history of alcohol use.  Tobacco:  reports that she has never smoked. She has never used smokeless tobacco. Social: Ms. Mclaurin  reports that she has never smoked. She has never used smokeless tobacco. She reports that she does not drink alcohol and does not use drugs. Medical:  has a past medical history of Allergy, Anemia, iron deficiency (04/09/2014), Arthritis, Atypical chest pain (04/09/2014), CD (contact dermatitis) (03/01/2014), Chest pain (04/09/2014), Diabetes mellitus without complication ([place]), and Lumbar radicular pain  (08/22/2015). Surgical: Ms. Mabin  has a past surgical history that includes Back surgery; Appendectomy; Cholecystectomy; Cesarean section; and Bunionectomy. Family: family history includes Asthma in her father; Stroke in her mother.  Laboratory Chemistry Profile   Renal Lab Results  Component Value Date   BUN 10 11/13/2020   CREATININE 0.44 11/13/2020   GFRAA >60 09/01/2019   GFRNONAA >60 11/13/2020    Hepatic Lab Results  Component Value Date   AST 15 09/01/2019   ALT 13 09/01/2019   ALBUMIN 4.2 09/01/2019   ALKPHOS 61 09/01/2019    Electrolytes Lab Results  Component Value Date   NA 137 11/13/2020   K 3.9 11/13/2020   CL 103 11/13/2020   CALCIUM 8.8 (L) 11/13/2020   MG 1.9 09/01/2019    Bone Lab Results  Component Value Date   VD25OH 33.18 09/01/2019   25OHVITD1 34 05/07/2016   25OHVITD2 <1.0 05/07/2016   25OHVITD3 34 05/07/2016    Inflammation (CRP: Acute Phase) (ESR: Chronic Phase) Lab Results  Component Value Date   CRP <0.8 09/01/2019   ESRSEDRATE 18 09/01/2019         Note: Above Lab results reviewed.  Recent Imaging Review  DG Chest 2 View CLINICAL DATA:  Chest pain.  EXAM: CHEST - 2 VIEW  COMPARISON:  Chest x-ray 04/09/2014.  FINDINGS: Mediastinum and hilar structures normal. Heart size normal. Mild left base atelectasis and or scarring again noted. No interim change. No acute infiltrate. No pleural effusion or pneumothorax. No acute bony abnormality. Degenerative change thoracic spine.  IMPRESSION: Mild left base atelectasis and or scarring again noted. No interim change. No acute cardiopulmonary disease identified.  Electronically Signed   By: Marcello Moores  Register   On: 11/13/2020 06:54 Note: Reviewed        Physical Exam  General appearance: Well nourished, well developed, and well hydrated. In no apparent acute distress Mental status: Alert, oriented x 3 (person, place, & time)       Respiratory: No evidence of acute respiratory  distress Eyes: PERLA Vitals: BP 130/79 (BP Location: Right Arm, Patient Position: Sitting, Cuff Size: Normal)    Pulse 86    Temp (!) 97.3 F (36.3 C) (Temporal)    Resp 16    Ht 5' 7"  (1.702 m)    Wt 174 lb (78.9 kg)    LMP  (LMP Unknown)    SpO2 100%    BMI 27.25 kg/m  BMI: Estimated body mass index is 27.25 kg/m as calculated from the following:   Height as of this encounter: 5' 7"  (1.702 m).   Weight as of this encounter: 174 lb (78.9 kg). Ideal: Ideal body weight: 61.6 kg (135 lb 12.9 oz) Adjusted ideal body weight: 68.5 kg (151 lb 1.3 oz)  Assessment   Status Diagnosis  Controlled Controlled Controlled 1. Chronic low back pain (1ry area of Pain) (Bilateral) (L>R)   2. Chronic lower extremity pain (2ry area of Pain) (Bilateral) (L>R)   3. Chronic hip pain (Bilateral) (R>L)   4. Chronic knee pain [facility] Area  of Pain) (Bilateral) (R>L)   5. Failed back surgical syndrome   6. Cervicalgia   7. Chronic upper extremity pain (Bilateral)   8. Chronic pain syndrome   9. Pharmacologic therapy   10. Chronic use of opiate for therapeutic purpose   11. Encounter for medication management   12. Encounter for chronic pain management      Updated Problems: No problems updated.  Plan of Care  Problem-specific:  No problem-specific Assessment & Plan notes found for this encounter.  Ms. Karema Tocci has a current medication list which includes the following long-term medication(s): cyclobenzaprine, escitalopram, gabapentin, hydrocodone-acetaminophen, [START ON 11/05/2021] hydrocodone-acetaminophen, [START ON 12/05/2021] hydrocodone-acetaminophen, [START ON 01/04/2022] hydrocodone-acetaminophen, melatonin, meloxicam, metformin, and triamcinolone ointment.  Pharmacotherapy (Medications Ordered): Meds ordered this encounter  Medications   HYDROcodone-acetaminophen (NORCO) 10-325 MG tablet    Sig: Take 1 tablet by mouth every 12 (twelve) hours as needed for severe pain. Must last 30  days    Dispense:  45 tablet    Refill:  0    DO NOT: delete (not duplicate); no partial-fill (will deny script to complete), no refill request (F/U required). DISPENSE: 1 day early if closed on fill date. WARN: No CNS-depressants within 8 hrs of med.   HYDROcodone-acetaminophen (NORCO) 10-325 MG tablet    Sig: Take 1 tablet by mouth every 12 (twelve) hours as needed for severe pain. Must last 30 days    Dispense:  45 tablet    Refill:  0    DO NOT: delete (not duplicate); no partial-fill (will deny script to complete), no refill request (F/U required). DISPENSE: 1 day early if closed on fill date. WARN: No CNS-depressants within 8 hrs of med.   HYDROcodone-acetaminophen (NORCO) 10-325 MG tablet    Sig: Take 1 tablet by mouth every 12 (twelve) hours as needed for severe pain. Must last 30 days    Dispense:  45 tablet    Refill:  0    DO NOT: delete (not duplicate); no partial-fill (will deny script to complete), no refill request (F/U required). DISPENSE: 1 day early if closed on fill date. WARN: No CNS-depressants within 8 hrs of med.   Orders:  No orders of the defined types were placed in this encounter.  Follow-up plan:   Return in about 3 months (around 02/03/2022) for Eval-day (M,W), (F2F), (MM).     Interventional Therapies  Risk   Complexity Considerations:   Estimated body mass index is 27.25 kg/m as calculated from the following:   Height as of this encounter: 5' 7"  (1.702 m).   Weight as of this encounter: 174 lb (78.9 kg). WNL   Planned   Pending:   Diagnostic right caudal ESI #2    Under consideration:   Diagnostic bilateral lumbar facet MBB Diagnostic Caudal ESI Diagnostic Bilateral L4-5 & L5-S1Lumbar TFESI Diagnostic bilateral sacroiliac joint Blk Diagnostic bilateral hip joint injection Diagnostic bilateral knee joint injection Diagnostic bilateral genicular NB Possible Racz procedure    Completed:   Palliative Caudal ESI x1 (02/01/2017) (100/100/80/80)    Therapeutic   Palliative (PRN) options:   Palliative Caudal ESI #2     Recent Visits Date Type Provider Dept  08/04/21 Office Visit Milinda Pointer, MD [facility]  Showing recent visits within past 90 days and meeting all other requirements Today's Visits Date Type Provider Dept  10/20/21 Office Visit Milinda Pointer, MD [facility]  Showing today's visits and meeting all other requirements Future Appointments No visits were found  meeting these conditions. Showing future appointments within next 90 days and meeting all other requirements  I discussed the assessment and treatment plan with the patient. The patient was provided an opportunity to ask questions and all were answered. The patient agreed with the plan and demonstrated an understanding of the instructions.  Patient advised to call back or seek an in-person evaluation if the symptoms or condition worsens.  Duration of encounter: 30 minutes.  Note by: Gaspar Cola, MD Date: 10/20/2021; Time: 8:53 AM

## 2021-10-20 ENCOUNTER — Encounter: Payer: Self-pay | Admitting: Pain Medicine

## 2021-10-20 ENCOUNTER — Ambulatory Visit: Payer: No Typology Code available for payment source | Attending: Pain Medicine | Admitting: Pain Medicine

## 2021-10-20 ENCOUNTER — Other Ambulatory Visit: Payer: Self-pay

## 2021-10-20 VITALS — BP 130/79 | HR 86 | Temp 97.3°F | Resp 16 | Ht 67.0 in | Wt 174.0 lb

## 2021-10-20 DIAGNOSIS — Z79891 Long term (current) use of opiate analgesic: Secondary | ICD-10-CM | POA: Insufficient documentation

## 2021-10-20 DIAGNOSIS — M79605 Pain in left leg: Secondary | ICD-10-CM | POA: Diagnosis present

## 2021-10-20 DIAGNOSIS — Z79899 Other long term (current) drug therapy: Secondary | ICD-10-CM | POA: Diagnosis present

## 2021-10-20 DIAGNOSIS — G8929 Other chronic pain: Secondary | ICD-10-CM | POA: Diagnosis present

## 2021-10-20 DIAGNOSIS — M25561 Pain in right knee: Secondary | ICD-10-CM | POA: Insufficient documentation

## 2021-10-20 DIAGNOSIS — M25552 Pain in left hip: Secondary | ICD-10-CM | POA: Insufficient documentation

## 2021-10-20 DIAGNOSIS — M79621 Pain in right upper arm: Secondary | ICD-10-CM | POA: Insufficient documentation

## 2021-10-20 DIAGNOSIS — M25562 Pain in left knee: Secondary | ICD-10-CM | POA: Insufficient documentation

## 2021-10-20 DIAGNOSIS — M25551 Pain in right hip: Secondary | ICD-10-CM | POA: Insufficient documentation

## 2021-10-20 DIAGNOSIS — G894 Chronic pain syndrome: Secondary | ICD-10-CM | POA: Diagnosis present

## 2021-10-20 DIAGNOSIS — M5441 Lumbago with sciatica, right side: Secondary | ICD-10-CM | POA: Diagnosis present

## 2021-10-20 DIAGNOSIS — M79604 Pain in right leg: Secondary | ICD-10-CM | POA: Insufficient documentation

## 2021-10-20 DIAGNOSIS — M961 Postlaminectomy syndrome, not elsewhere classified: Secondary | ICD-10-CM | POA: Insufficient documentation

## 2021-10-20 DIAGNOSIS — M79622 Pain in left upper arm: Secondary | ICD-10-CM | POA: Diagnosis present

## 2021-10-20 DIAGNOSIS — M5442 Lumbago with sciatica, left side: Secondary | ICD-10-CM | POA: Diagnosis not present

## 2021-10-20 DIAGNOSIS — M542 Cervicalgia: Secondary | ICD-10-CM | POA: Diagnosis present

## 2021-10-20 MED ORDER — HYDROCODONE-ACETAMINOPHEN 10-325 MG PO TABS: 1.0000 | ORAL_TABLET | Freq: Two times a day (BID) | ORAL | 0 refills | Status: DC | PRN

## 2021-10-20 NOTE — Progress Notes (Signed)
Nursing Pain Medication Assessment:  Safety precautions to be maintained throughout the outpatient stay will include: orient to surroundings, keep bed in low position, maintain call bell within reach at all times, provide assistance with transfer out of bed and ambulation.  Medication Inspection Compliance: Pill count conducted under aseptic conditions, in front of the patient. Neither the pills nor the bottle was removed from the patient's sight at any time. Once count was completed pills were immediately returned to the patient in their original bottle.  Medication: Hydrocodone/APAP Pill/Patch Count:  31.5 of 45 pills remain Pill/Patch Appearance: Markings consistent with prescribed medication Bottle Appearance: Standard pharmacy container. Clearly labeled. Filled Date: 39 / 09 / 2022 Last Medication intake:  Yesterday

## 2021-10-20 NOTE — Patient Instructions (Signed)
____________________________________________________________________________________________ ° °Medication Rules ° °Purpose: To inform patients, and their family members, of our rules and regulations. ° °Applies to: All patients receiving prescriptions (written or electronic). ° °Pharmacy of record: Pharmacy where electronic prescriptions will be sent. If written prescriptions are taken to a different pharmacy, please inform the nursing staff. The pharmacy listed in the electronic medical record should be the one where you would like electronic prescriptions to be sent. ° °Electronic prescriptions: In compliance with the [place] [employer]reng[employer] Opioid Misuse Prevention (STOP) Act of 2017 (Session Law 2017-74/H243), effective November 02, 2018, all controlled substances must be electronically prescribed. Calling prescriptions to the pharmacy will cease to exist. ° °Prescription refills: Only during scheduled appointments. Applies to all prescriptions. ° °NOTE: The following applies primarily to controlled substances (Opioid* Pain Medications).  ° °Type of encounter (visit): For patients receiving controlled substances, face-to-face visits are required. (Not an option or up to the patient.) ° °Patient's responsibilities: °Pain Pills: Bring all pain pills to every appointment (except for procedure appointments). °Pill Bottles: Bring pills in original pharmacy bottle. Always bring the newest bottle. Bring bottle, even if empty. °Medication refills: You are responsible for knowing and keeping track of what medications you take and those you need refilled. °The day before your appointment: write a list of all prescriptions that need to be refilled. °The day of the appointment: give the list to the admitting nurse. Prescriptions will be written only during appointments. No prescriptions will be written on procedure days. °If you forget a medication: it will not be "Called in", "Faxed", or "electronically sent". You will  need to get another appointment to get these prescribed. °No early refills. Do not call asking to have your prescription filled early. °Prescription Accuracy: You are responsible for carefully inspecting your prescriptions before leaving our office. Have the discharge nurse carefully go over each prescription with you, before taking them home. Make sure that your name is accurately spelled, that your address is correct. Check the name and dose of your medication to make sure it is accurate. Check the number of pills, and the written instructions to make sure they are clear and accurate. Make sure that you are given enough medication to last until your next medication refill appointment. °Taking Medication: Take medication as prescribed. When it comes to controlled substances, taking less pills or less frequently than prescribed is permitted and encouraged. °Never take more pills than instructed. °Never take medication more frequently than prescribed.  °Inform other Doctors: Always inform, all of your healthcare providers, of all the medications you take. °Pain Medication from other Providers: You are not allowed to accept any additional pain medication from any other Doctor or Healthcare provider. There are two exceptions to this rule. (see below) In the event that you require additional pain medication, you are responsible for notifying us, as stated below. °Cough Medicine: Often these contain an opioid, such as codeine or hydrocodone. Never accept or take cough medicine containing these opioids if you are already taking an opioid* medication. The combination may cause respiratory failure and death. °Medication Agreement: You are responsible for carefully reading and following our Medication Agreement. This must be signed before receiving any prescriptions from our practice. Safely store a copy of your signed Agreement. Violations to the Agreement will result in no further prescriptions. (Additional copies of our  Medication Agreement are available upon request.) °Laws, Rules, & Regulations: All patients are expected to follow all Federal and State Laws, Statutes, Rules, & Regulations. Ignorance of   the Laws does not constitute a valid excuse.  °Illegal drugs and Controlled Substances: The use of illegal substances (including, but not limited to marijuana and its derivatives) and/or the illegal use of any controlled substances is strictly prohibited. Violation of this rule may result in the immediate and permanent discontinuation of any and all prescriptions being written by our practice. The use of any illegal substances is prohibited. °Adopted CDC guidelines & recommendations: Target dosing levels will be at or below 60 MME/day. Use of benzodiazepines** is not recommended. ° °Exceptions: There are only two exceptions to the rule of not receiving pain medications from other Healthcare Providers. °Exception #1 (Emergencies): In the event of an emergency (i.e.: accident requiring emergency care), you are allowed to receive additional pain medication. However, you are responsible for: As soon as you are able, call our office [phone], at any time of the day or night, and leave a message stating your name, the date and nature of the emergency, and the name and dose of the medication prescribed. In the event that your call is answered by a member of our staff, make sure to document and save the date, time, and the name of the person that took your information.  °Exception #2 (Planned Surgery): In the event that you are scheduled by another doctor or dentist to have any type of surgery or procedure, you are allowed (for a period no longer than 30 days), to receive additional pain medication, for the acute post-op pain. However, in this case, you are responsible for picking up a copy of our "Post-op Pain Management for Surgeons" handout, and giving it to your surgeon or dentist. This document is available at our office, and  does not require an appointment to obtain it. Simply go to our office during business hours (Monday-Thursday from 8:00 AM to 4:00 PM) (Friday 8:00 AM to 12:00 Noon) or if you have a scheduled appointment with us, prior to your surgery, and ask for it by name. In addition, you are responsible for: calling our office [phone], at any time of the day or night, and leaving a message stating your name, name of your surgeon, type of surgery, and date of procedure or surgery. Failure to comply with your responsibilities may result in termination of therapy involving the controlled substances. °Medication Agreement Violation. Following the above rules, including your responsibilities will help you in avoiding a Medication Agreement Violation (“Breaking your Pain Medication Contract”). ° °*Opioid medications include: morphine, codeine, oxycodone, oxymorphone, hydrocodone, hydromorphone, meperidine, tramadol, tapentadol, buprenorphine, fentanyl, methadone. °**Benzodiazepine medications include: diazepam (Valium), alprazolam (Xanax), clonazepam (Klonopine), lorazepam (Ativan), clorazepate (Tranxene), chlordiazepoxide (Librium), estazolam (Prosom), oxazepam (Serax), temazepam (Restoril), triazolam (Halcion) °(Last updated: 07/30/2021) °____________________________________________________________________________________________ ° ____________________________________________________________________________________________ ° °Medication Recommendations and Reminders ° °Applies to: All patients receiving prescriptions (written and/or electronic). ° °Medication Rules & Regulations: These rules and regulations exist for your safety and that of others. They are not flexible and neither are we. Dismissing or ignoring them will be considered "non-compliance" with medication therapy, resulting in complete and irreversible termination of such therapy. (See document titled "Medication Rules" for more details.) In all conscience,  because of safety reasons, we cannot continue providing a therapy where the patient does not follow instructions. ° °Pharmacy of record:  °Definition: This is the pharmacy where your electronic prescriptions will be sent.  °We do not endorse any particular pharmacy, however, we have experienced problems with [facility] not securing enough medication supply for the community. °We do not restrict you   in your choice of pharmacy. However, once we write for your prescriptions, we will NOT be re-sending more prescriptions to fix restricted supply problems created by your pharmacy, or your insurance.  °The pharmacy listed in the electronic medical record should be the one where you want electronic prescriptions to be sent. °If you choose to change pharmacy, simply notify our nursing staff. ° °Recommendations: °Keep all of your pain medications in a safe place, under lock and key, even if you live alone. We will NOT replace lost, stolen, or damaged medication. °After you fill your prescription, take 1 week's worth of pills and put them away in a safe place. You should keep a separate, properly labeled bottle for this purpose. The remainder should be kept in the original bottle. Use this as your primary supply, until it runs out. Once it's gone, then you know that you have 1 week's worth of medicine, and it is time to come in for a prescription refill. If you do this correctly, it is unlikely that you will ever run out of medicine. °To make sure that the above recommendation works, it is very important that you make sure your medication refill appointments are scheduled at least 1 week before you run out of medicine. To do this in an effective manner, make sure that you do not leave the office without scheduling your next medication management appointment. Always ask the nursing staff to show you in your prescription , when your medication will be running out. Then arrange for the receptionist to get you a return appointment,  at least 7 days before you run out of medicine. Do not wait until you have 1 or 2 pills left, to come in. This is very poor planning and does not take into consideration that we may need to cancel appointments due to bad weather, sickness, or emergencies affecting our staff. °DO NOT ACCEPT A "Partial Fill": If for any reason your pharmacy does not have enough pills/tablets to completely fill or refill your prescription, do not allow for a "partial fill". The law allows the pharmacy to complete that prescription within 72 hours, without requiring a new prescription. If they do not fill the rest of your prescription within those 72 hours, you will need a separate prescription to fill the remaining amount, which we will NOT provide. If the reason for the partial fill is your insurance, you will need to talk to the pharmacist about payment alternatives for the remaining tablets, but again, DO NOT ACCEPT A PARTIAL FILL, unless you can trust your pharmacist to obtain the remainder of the pills within 72 hours. ° °Prescription refills and/or changes in medication(s):  °Prescription refills, and/or changes in dose or medication, will be conducted only during scheduled medication management appointments. (Applies to both, written and electronic prescriptions.) °No refills on procedure days. No medication will be changed or started on procedure days. No changes, adjustments, and/or refills will be conducted on a procedure day. Doing so will interfere with the diagnostic portion of the procedure. °No phone refills. No medications will be "called into the pharmacy". °No Fax refills. °No weekend refills. °No Holliday refills. °No after hours refills. ° °Remember:  °Business hours are:  °Monday to Thursday 8:00 AM to 4:00 PM °Provider's Schedule: °Tevyn Codd, MD - Appointments are:  °Medication management: Monday and Wednesday 8:00 AM to 4:00 PM °Procedure day: Tuesday and Thursday 7:30 AM to 4:00 PM °Bilal Lateef, MD -  Appointments are:  °Medication management: Tuesday and Thursday 8:00   AM to 4:00 PM °Procedure day: Monday and Wednesday 7:30 AM to 4:00 PM °(Last update: 05/22/2020) °____________________________________________________________________________________________ ° ____________________________________________________________________________________________ ° °CBD (cannabidiol) & Delta-8 (Delta-8 tetrahydrocannabinol) WARNING ° °Intro: Cannabidiol (CBD) and tetrahydrocannabinol (THC), are two natural compounds found in plants of the Cannabis genus. They can both be extracted from hemp or cannabis. Hemp and cannabis come from the Cannabis sativa plant. Both compounds interact with your body’s endocannabinoid system, but they have very different effects. CBD does not produce the high sensation associated with cannabis. Delta-8 tetrahydrocannabinol, also known as delta-8 THC, is a psychoactive substance found in the Cannabis sativa plant, of which marijuana and hemp are two varieties. THC is responsible for the high associated with the illicit use of marijuana. ° °Applicable to: All individuals currently taking or considering taking CBD (cannabidiol) and, more important, all patients taking opioid analgesic controlled substances (pain medication). (Example: oxycodone; oxymorphone; hydrocodone; hydromorphone; morphine; methadone; tramadol; tapentadol; fentanyl; buprenorphine; butorphanol; dextromethorphan; meperidine; codeine; etc.) ° °Legal status: CBD remains a Schedule I drug prohibited for any use. CBD is illegal with one exception. In the [place], CBD has a limited Food and Drug Administration (FDA) approval for the treatment of two specific types of epilepsy disorders. Only one CBD product has been approved by the FDA for this purpose: "Epidiolex". FDA is aware that some companies are marketing products containing cannabis and cannabis-derived compounds in ways that violate the Federal Food, Drug and Cosmetic Act  (FD&C Act) and that may put the health and safety of consumers at risk. The FDA, a Federal agency, has not enforced the CBD status since 2018.  ° °Legality: Some manufacturers ship CBD products nationally, which is illegal. Often such products are sold online and are therefore available throughout the country. CBD is openly sold in head shops and health food stores in some states where such sales have not been explicitly legalized. Selling unapproved products with unsubstantiated therapeutic claims is not only a violation of the law, but also can put patients at risk, as these products have not been proven to be safe or effective. Federal illegality makes it difficult to conduct research on CBD. ° °Reference: "FDA Regulation of Cannabis and Cannabis-Derived Products, Including Cannabidiol (CBD)" - https://www.fda.gov/news-events/public-health-focus/fda-regulation-cannabis-and-cannabis-derived-products-including-cannabidiol-cbd ° °Warning: CBD is not FDA approved and has not undergo the same manufacturing controls as prescription drugs.  This means that the purity and safety of available CBD may be questionable. Most of the time, despite manufacturer's claims, it is contaminated with THC (delta-9-tetrahydrocannabinol - the chemical in marijuana responsible for the "HIGH").  When this is the case, the THC contaminant will trigger a positive urine drug screen (UDS) test for Marijuana (carboxy-THC). Because a positive UDS for any illicit substance is a violation of our medication agreement, your opioid analgesics (pain medicine) may be permanently discontinued. °The FDA recently put out a warning about 5 things that everyone should be aware of regarding Delta-8 THC: °Delta-8 THC products have not been evaluated or approved by the FDA for safe use and may be marketed in ways that put the public health at risk. °The FDA has received adverse event reports involving delta-8 THC-containing products. °Delta-8 THC has  psychoactive and intoxicating effects. °Delta-8 THC manufacturing often involve use of potentially harmful chemicals to create the concentrations of delta-8 THC claimed in the marketplace. The final delta-8 THC product may have potentially harmful by-products (contaminants) due to the chemicals used in the process. Manufacturing of delta-8 THC products may occur in uncontrolled or unsanitary settings, which may   lead to the presence of unsafe contaminants or other potentially harmful substances. Delta-8 THC products should be kept out of the reach of children and pets.  MORE ABOUT CBD  General Information: CBD was discovered in 22 and it is a derivative of the cannabis sativa genus plants (Marijuana and Hemp). It is one of the 113 identified substances found in Marijuana. It accounts for up to 40% of the plant's extract. As of 2018, preliminary clinical studies on CBD included research for the treatment of anxiety, movement disorders, and pain. CBD is available and consumed in multiple forms, including inhalation of smoke or vapor, as an aerosol spray, and by mouth. It may be supplied as an oil containing CBD, capsules, dried cannabis, or as a liquid solution. CBD is thought not to be as psychoactive as THC (delta-9-tetrahydrocannabinol - the chemical in marijuana responsible for the "HIGH"). Studies suggest that CBD may interact with different biological target receptors in the body, including cannabinoid and other neurotransmitter receptors. As of 2018 the mechanism of action for its biological effects has not been determined.  Side-effects   Adverse reactions: Dry mouth, diarrhea, decreased appetite, fatigue, drowsiness, malaise, weakness, sleep disturbances, and others.  Drug interactions: CBC may interact with other medications such as blood-thinners. (Last update: 08/01/2021) ____________________________________________________________________________________________

## 2021-10-25 IMAGING — CR DG LUMBAR SPINE COMPLETE W/ BEND
1 series · 7 of 7 positions shown · non-contrast
Comparison: Plain films lumbar spine 04/15/2016.

CLINICAL DATA: Chronic low back pain radiating into the right leg.
Symptoms have worsened over the past 3 weeks. No known injury.
History of prior lumbar surgery.

EXAM:
LUMBAR SPINE - COMPLETE WITH BENDING VIEWS

[Series 1: dg lumbar spine complete w/bend 6+v · 0.14mm/px · 7 of 7 slices shown]
[im 1/7]
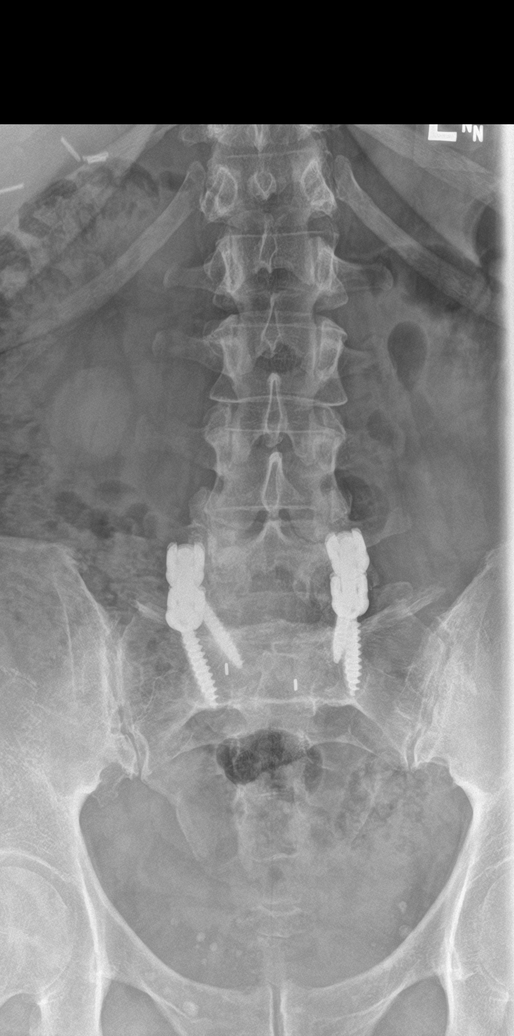
[im 2/7]
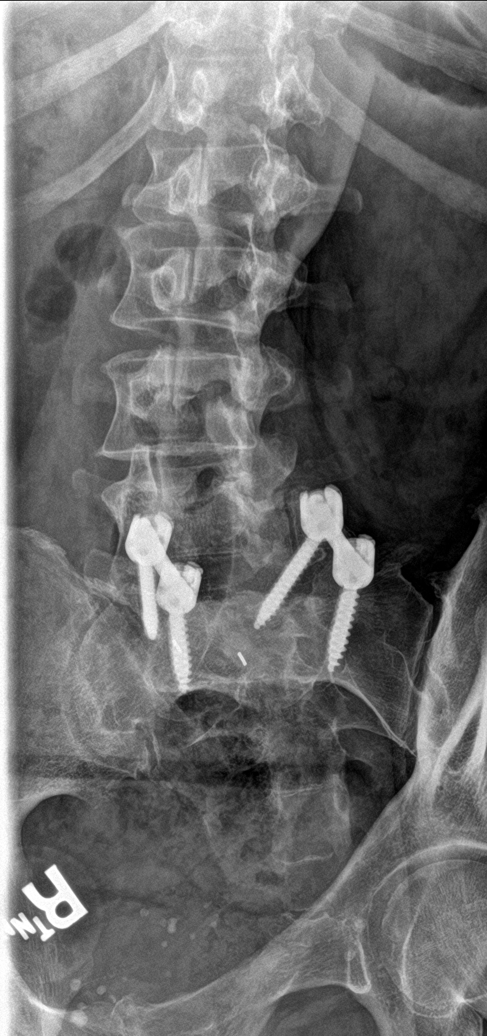
[im 3/7]
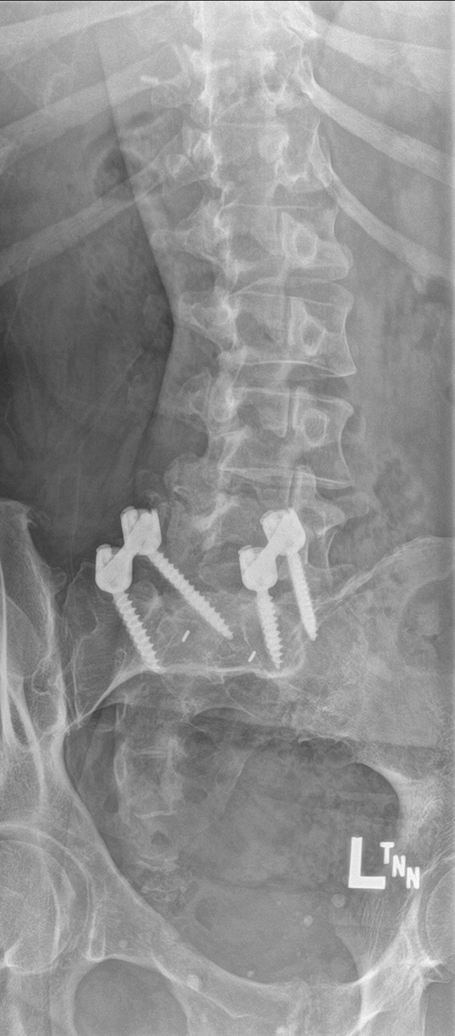
[im 4/7]
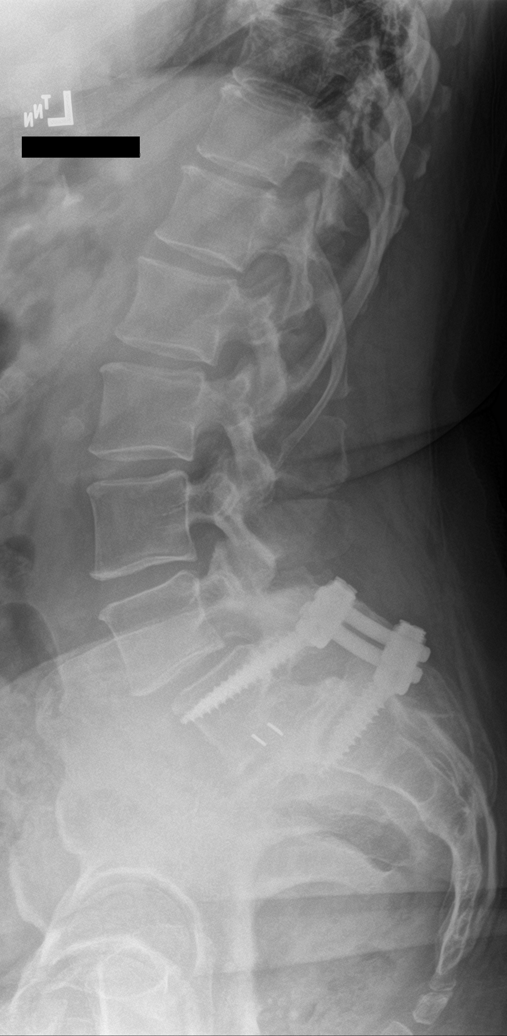
[im 5/7]
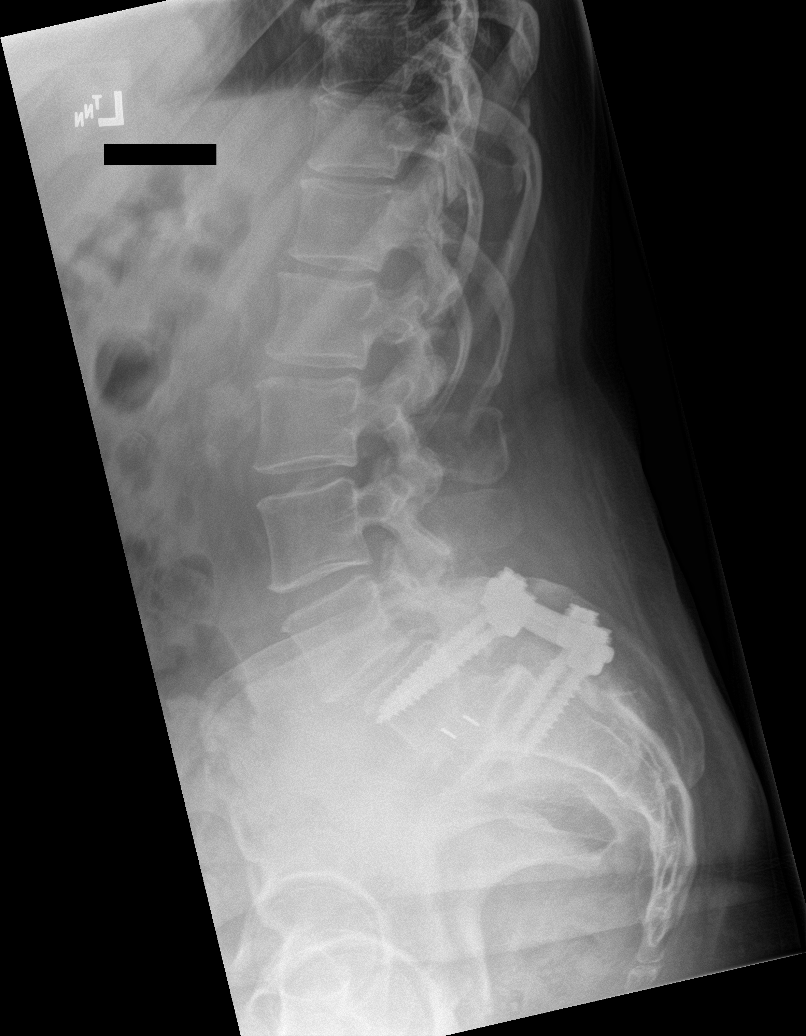
[im 6/7]
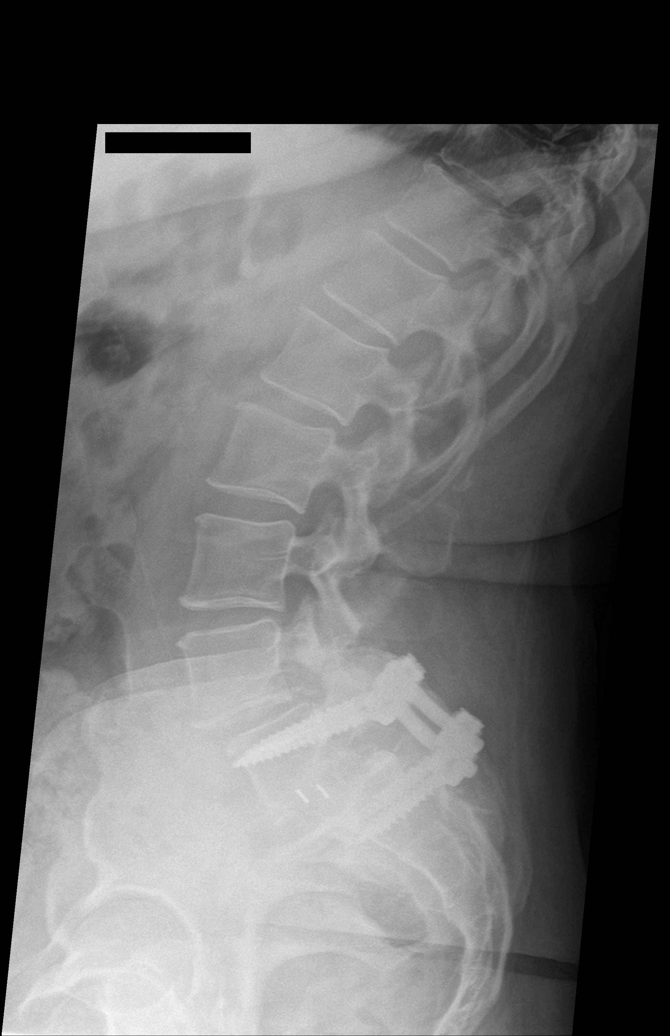
[im 7/7]
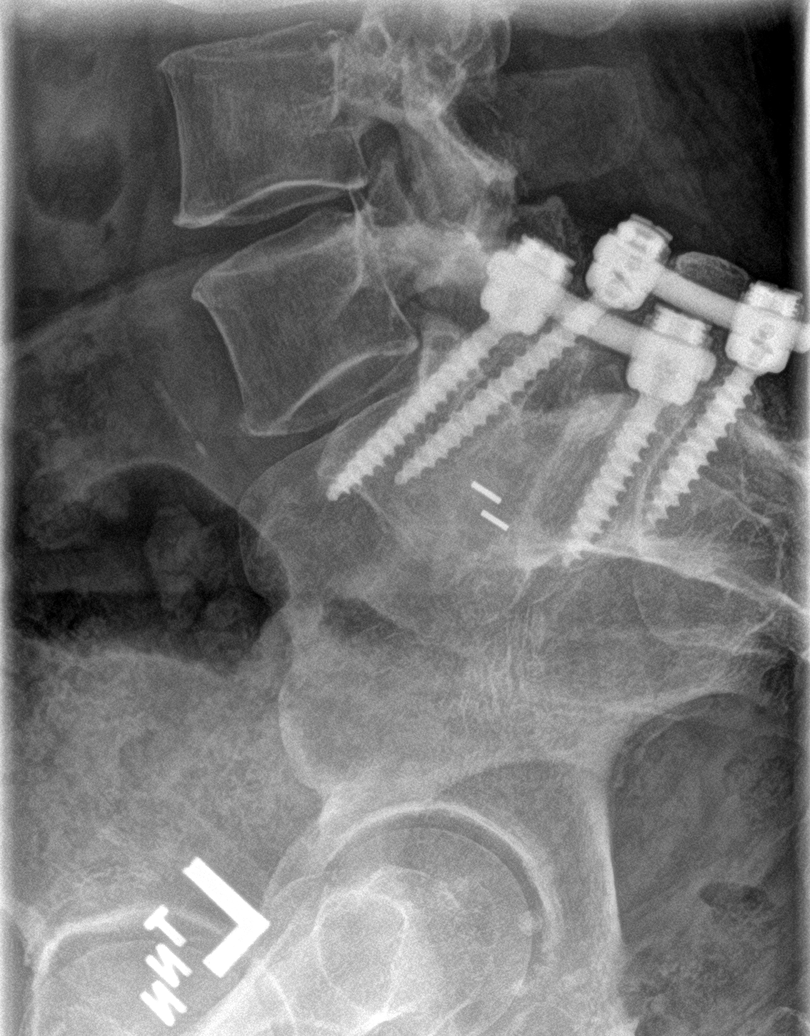

[7 of 7 positions shown; findings below may reference images not displayed]

FINDINGS: Vertebral body height is maintained. 0.5 cm anterolisthesis L4 on L5
is unchanged. There is approximately 1.4 cm anterolisthesis L5 on
S1. The patient is status post L5-S1 fusion. Hardware is intact.
Mild lucency about the left S1 screw is unchanged. Mild loss of disc
space height at L4-5 is also unchanged. No pathologic motion is seen
with flexion or extension. Paraspinous structures are unremarkable.
IMPRESSION: No new abnormality compared to the prior examination.

Degenerative disease most notable at L4-5 where there is some loss
of disc space height and 0.5 cm anterolisthesis due to facet
degenerative disease.

Status post L5-S1 fusion.

## 2022-01-29 NOTE — Progress Notes (Signed)
PROVIDER NOTE: Information contained herein reflects review and annotations entered in association with encounter. Interpretation of such information and data should be left to medically-trained personnel. Information provided to patient can be located elsewhere in the medical record under "Patient Instructions". Document created using STT-dictation technology, any transcriptional errors that may result from process are unintentional.  ?  ?Patient: Stephanie [name]  Service Category: E/M  Provider: Gaspar Cola, MD  ?DOB: 1961/05/25  DOS: 02/02/2022  Specialty: Interventional Pain Management  ?MRN: [id number]  Setting: Ambulatory outpatient  PCP: Stephanie Maudlin, NP  ?Type: Established Patient    Referring Provider: Latanya Maudlin, NP  ?Location: Office  Delivery: Face-to-face    ? ?HPI  ?[name], a 61 y.o. year old female, is here today because of her Chronic pain syndrome [G89.4]. [name] primary complain today is Back Pain (Low middle), Leg Pain (leg), and Knee Pain ?Last encounter: My last encounter with her was on 10/20/2021. ?Pertinent problems: 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); Musculoskeletal pain; Neurogenic pain; Spasm of paraspinal muscle; Chronic hip pain (Bilateral) (R>L); Osteoarthritis of knee (Bilateral); Osteoarthritis of hip (Bilateral); Osteoarthritis of sacroiliac joint (Bilateral); Chronic sacroiliac joint pain (Bilateral); Chronic pain syndrome; Failed back surgical  syndrome; 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 ?Pain Assessment: Severity of Chronic pain is reported as a 6 /10. Location: Back Lower/radiates down right leg to all of toes. Onset: More than a month ago. Quality: Cramping, Dull. Timing: Constant. Modifying factor(s): meds, heat. ?Vitals:  height is '5\' 7"'$  (1.702 m) and weight is 160 lb (72.6 kg). Her temperature is 97.2 ?F (36.2 ?C) (abnormal). Her blood pressure is 125/68 and her pulse is 77. Her respiration is 16 and oxygen saturation is 98%.  ? ?Reason for encounter: medication management.   The patient indicates doing well with the current medication regimen. No adverse reactions or side effects reported to the medications.  ? ?PCP apparently changed. Patient reminded about 2021 nonopioid transfer. ? ?RTCB: 05/04/2022 ?Nonopioids transferred 10/07/2020: Mobic, Neurontin, Flexeril, melatonin, and triamcinolone ointment. ? ?Pharmacotherapy Assessment  ?Analgesic: Hydrocodone/acetaminophen 10/325 mg, 1 tab PO BID (20 mg/day of hydrocodone) ?MME/day: 20 mg/day.  ? ?Monitoring: ?[place] PMP: PDMP reviewed during this encounter.       ?Pharmacotherapy: No side-effects or adverse reactions reported. ?Compliance: No problems identified. ?Effectiveness: Clinically acceptable. ? ?Dewayne Shorter, RN  02/02/2022  8:10 AM  Sign when Signing Visit ?Nursing Pain Medication Assessment:  ?Safety precautions to be maintained throughout the outpatient stay will include: orient to surroundings, keep bed in low position, maintain call bell within reach at all times, provide assistance with transfer out of bed and ambulation.  ?Medication Inspection  Compliance: Pill count conducted under aseptic conditions, in front of the patient. Neither the pills nor the bottle was removed from the patient's sight at any  time. Once count was completed pills were immediately returned to the patient in their original bottle. ? ?Medication: Hydrocodone/APAP ?Pill/Patch Count:  8 of 45 pills remain ?Pill/Patch Appearance: Markings consistent with prescribed medication ?Bottle Appearance: Standard pharmacy container. Clearly labeled. ?Filled Date: 03 / 08 / 2023 ?Last Medication intake:  Today ?   UDS:  ?Summary  ?Date Value Ref Range Status  ?04/07/2021 Note  Final  ?  Comment:  ?  ==================================================================== ?[employer] Select 13 (MW) ?==================================================================== ?Test                             Result       Flag       Units ? ?Drug Present and Declared for Prescription Verification ?  Hydrocodone                    1923         EXPECTED   ng/mg creat ?  Hydromorphone                  186          EXPECTED   ng/mg creat ?  Dihydrocodeine                 134          EXPECTED   ng/mg creat ?  Norhydrocodone                 1927         EXPECTED   ng/mg creat ?   Sources of hydrocodone include scheduled prescription medications. ?   Hydromorphone, dihydrocodeine and norhydrocodone are expected ?   metabolites of hydrocodone. Hydromorphone and dihydrocodeine are ?   also available as scheduled prescription medications. ? ?==================================================================== ?Test                      Result    Flag   Units      Ref Range ?  Creatinine              125              mg/dL      >=20 ?==================================================================== ?Declared Medications: ? The flagging and interpretation on this report are based on the ? following declared medications.  Unexpected results may arise from ? inaccuracies in the declared medications. ? ? **Note: The  testing scope of this panel includes these medications: ? ? Hydrocodone (Norco) ? ? **Note: The testing scope of this panel does not include the ? following reported medications: ? ? Acetaminophen (Norco) ? Cholecalciferol ? Cyclobenzaprine (Flexeril) ? Gabapentin (Neurontin) ? Lurasidone Anette Guarneri) ? Melatonin ? Meloxicam (Mobic) ? Metformin ? Triamcinolone (Kenalog) ? Venlafaxine (Effexor) ? Vitamin C ?==================================================================== ?For clinical consultation, please call [phone]. ?==================================================================== ?  ?  ? ?ROS  ?Constitutional: Denies any fever or chills ?Gastrointestinal: No reported hemesis, hematochezia, vomiting, or acute GI distress ?Musculoskeletal: Denies any acute onset joint swelling, redness, loss of ROM, or weakness ?Neurological: No reported episodes of acute onset apraxia, aphasia, dysarthria, agnosia, amnesia, paralysis, loss of coordination, or loss of consciousness ? ?Medication Review  ?Cholecalciferol, Dulaglutide, HYDROcodone-acetaminophen, Melatonin, cyanocobalamin, cyclobenzaprine, escitalopram, gabapentin, ibuprofen, meloxicam, metFORMIN, and triamcinolone ointment ? ?History Review  ?Allergy: Ms. Lathon is allergic to phenergan [promethazine hcl]. ?Drug: Ms. Chagnon  reports no history of drug use. ?Alcohol:  reports no history of alcohol use. ?Tobacco:  reports  that she has never smoked. She has never used smokeless tobacco. ?Social: Ms. Fong  reports that she has never smoked. She has never used smokeless tobacco. She reports that she does not drink alcohol and does not use drugs. ?Medical:  has a past medical history of Allergy, Anemia, iron deficiency (04/09/2014), Arthritis, Atypical chest pain (04/09/2014), CD (contact dermatitis) (03/01/2014), Chest pain (04/09/2014), Diabetes mellitus without complication ([place]), and Lumbar radicular pain (08/22/2015). ?Surgical: Ms. Cage  has a past  surgical history that includes Back surgery; Appendectomy; Cholecystectomy; Cesarean section; and Bunionectomy. ?Family: family history includes Asthma in her father; Stroke in her mother. ? ?Laboratory Chemistry Profile

## 2022-02-02 ENCOUNTER — Ambulatory Visit: Payer: No Typology Code available for payment source | Attending: Pain Medicine | Admitting: Pain Medicine

## 2022-02-02 ENCOUNTER — Encounter: Payer: Self-pay | Admitting: Pain Medicine

## 2022-02-02 VITALS — BP 125/68 | HR 77 | Temp 97.2°F | Resp 16 | Ht 67.0 in | Wt 160.0 lb

## 2022-02-02 DIAGNOSIS — Z79899 Other long term (current) drug therapy: Secondary | ICD-10-CM | POA: Diagnosis present

## 2022-02-02 DIAGNOSIS — M79622 Pain in left upper arm: Secondary | ICD-10-CM | POA: Diagnosis present

## 2022-02-02 DIAGNOSIS — M5441 Lumbago with sciatica, right side: Secondary | ICD-10-CM | POA: Insufficient documentation

## 2022-02-02 DIAGNOSIS — M25562 Pain in left knee: Secondary | ICD-10-CM | POA: Diagnosis present

## 2022-02-02 DIAGNOSIS — G8929 Other chronic pain: Secondary | ICD-10-CM | POA: Insufficient documentation

## 2022-02-02 DIAGNOSIS — M5442 Lumbago with sciatica, left side: Secondary | ICD-10-CM | POA: Insufficient documentation

## 2022-02-02 DIAGNOSIS — M79604 Pain in right leg: Secondary | ICD-10-CM | POA: Diagnosis present

## 2022-02-02 DIAGNOSIS — M79605 Pain in left leg: Secondary | ICD-10-CM | POA: Insufficient documentation

## 2022-02-02 DIAGNOSIS — Z79891 Long term (current) use of opiate analgesic: Secondary | ICD-10-CM | POA: Diagnosis present

## 2022-02-02 DIAGNOSIS — M79621 Pain in right upper arm: Secondary | ICD-10-CM | POA: Insufficient documentation

## 2022-02-02 DIAGNOSIS — M961 Postlaminectomy syndrome, not elsewhere classified: Secondary | ICD-10-CM | POA: Insufficient documentation

## 2022-02-02 DIAGNOSIS — M25552 Pain in left hip: Secondary | ICD-10-CM | POA: Insufficient documentation

## 2022-02-02 DIAGNOSIS — M25551 Pain in right hip: Secondary | ICD-10-CM | POA: Diagnosis present

## 2022-02-02 DIAGNOSIS — M542 Cervicalgia: Secondary | ICD-10-CM | POA: Insufficient documentation

## 2022-02-02 DIAGNOSIS — M25561 Pain in right knee: Secondary | ICD-10-CM | POA: Diagnosis present

## 2022-02-02 DIAGNOSIS — G894 Chronic pain syndrome: Secondary | ICD-10-CM | POA: Insufficient documentation

## 2022-02-02 MED ORDER — HYDROCODONE-ACETAMINOPHEN 10-325 MG PO TABS: 1.0000 | ORAL_TABLET | Freq: Two times a day (BID) | ORAL | 0 refills | Status: DC | PRN

## 2022-02-02 NOTE — Progress Notes (Signed)
Nursing Pain Medication Assessment:  ?Safety precautions to be maintained throughout the outpatient stay will include: orient to surroundings, keep bed in low position, maintain call bell within reach at all times, provide assistance with transfer out of bed and ambulation.  ?Medication Inspection Compliance: Pill count conducted under aseptic conditions, in front of the patient. Neither the pills nor the bottle was removed from the patient's sight at any time. Once count was completed pills were immediately returned to the patient in their original bottle. ? ?Medication: Hydrocodone/APAP ?Pill/Patch Count:  8 of 45 pills remain ?Pill/Patch Appearance: Markings consistent with prescribed medication ?Bottle Appearance: Standard pharmacy container. Clearly labeled. ?Filled Date: 03 / 08 / 2023 ?Last Medication intake:  Today ?

## 2022-02-02 NOTE — Patient Instructions (Signed)
____________________________________________________________________________________________ ? ?Medication Rules ? ?Purpose: To inform patients, and their family members, of our rules and regulations. ? ?Applies to: All patients receiving prescriptions (written or electronic). ? ?Pharmacy of record: Pharmacy where electronic prescriptions will be sent. If written prescriptions are taken to a different pharmacy, please inform the nursing staff. The pharmacy listed in the electronic medical record should be the one where you would like electronic prescriptions to be sent. ? ?Electronic prescriptions: In compliance with the [place] Opioid Misuse Prevention (STOP) Act of 2017 (Session Law 2017-74/H243), effective November 02, 2018, all controlled substances must be electronically prescribed. Calling prescriptions to the pharmacy will cease to exist. ? ?Prescription refills: Only during scheduled appointments. Applies to all prescriptions. ? ?NOTE: The following applies primarily to controlled substances (Opioid* Pain Medications).  ? ?Type of encounter (visit): For patients receiving controlled substances, face-to-face visits are required. (Not an option or up to the patient.) ? ?Patient's responsibilities: ?Pain Pills: Bring all pain pills to every appointment (except for procedure appointments). ?Pill Bottles: Bring pills in original pharmacy bottle. Always bring the newest bottle. Bring bottle, even if empty. ?Medication refills: You are responsible for knowing and keeping track of what medications you take and those you need refilled. ?The day before your appointment: write a list of all prescriptions that need to be refilled. ?The day of the appointment: give the list to the admitting nurse. Prescriptions will be written only during appointments. No prescriptions will be written on procedure days. ?If you forget a medication: it will not be "Called in", "Faxed", or "electronically sent". You will  need to get another appointment to get these prescribed. ?No early refills. Do not call asking to have your prescription filled early. ?Prescription Accuracy: You are responsible for carefully inspecting your prescriptions before leaving our office. Have the discharge nurse carefully go over each prescription with you, before taking them home. Make sure that your name is accurately spelled, that your address is correct. Check the name and dose of your medication to make sure it is accurate. Check the number of pills, and the written instructions to make sure they are clear and accurate. Make sure that you are given enough medication to last until your next medication refill appointment. ?Taking Medication: Take medication as prescribed. When it comes to controlled substances, taking less pills or less frequently than prescribed is permitted and encouraged. ?Never take more pills than instructed. ?Never take medication more frequently than prescribed.  ?Inform other Doctors: Always inform, all of your healthcare providers, of all the medications you take. ?Pain Medication from other Providers: You are not allowed to accept any additional pain medication from any other Doctor or Healthcare provider. There are two exceptions to this rule. (see below) In the event that you require additional pain medication, you are responsible for notifying us, as stated below. ?Cough Medicine: Often these contain an opioid, such as codeine or hydrocodone. Never accept or take cough medicine containing these opioids if you are already taking an opioid* medication. The combination may cause respiratory failure and death. ?Medication Agreement: You are responsible for carefully reading and following our Medication Agreement. This must be signed before receiving any prescriptions from our practice. Safely store a copy of your signed Agreement. Violations to the Agreement will result in no further prescriptions. (Additional copies of our  Medication Agreement are available upon request.) ?Laws, Rules, & Regulations: All patients are expected to follow all Federal and State Laws, Statutes, Rules, & Regulations. Ignorance of   the Laws does not constitute a valid excuse.  ?Illegal drugs and Controlled Substances: The use of illegal substances (including, but not limited to marijuana and its derivatives) and/or the illegal use of any controlled substances is strictly prohibited. Violation of this rule may result in the immediate and permanent discontinuation of any and all prescriptions being written by our practice. The use of any illegal substances is prohibited. ?Adopted CDC guidelines & recommendations: Target dosing levels will be at or below 60 MME/day. Use of benzodiazepines** is not recommended. ? ?Exceptions: There are only two exceptions to the rule of not receiving pain medications from other Healthcare Providers. ?Exception #1 (Emergencies): In the event of an emergency (i.e.: accident requiring emergency care), you are allowed to receive additional pain medication. However, you are responsible for: As soon as you are able, call our office [phone], at any time of the day or night, and leave a message stating your name, the date and nature of the emergency, and the name and dose of the medication prescribed. In the event that your call is answered by a member of our staff, make sure to document and save the date, time, and the name of the person that took your information.  ?Exception #2 (Planned Surgery): In the event that you are scheduled by another doctor or dentist to have any type of surgery or procedure, you are allowed (for a period no longer than 30 days), to receive additional pain medication, for the acute post-op pain. However, in this case, you are responsible for picking up a copy of our "Post-op Pain Management for Surgeons" handout, and giving it to your surgeon or dentist. This document is available at our office, and  does not require an appointment to obtain it. Simply go to our office during business hours (Monday-Thursday from 8:00 AM to 4:00 PM) (Friday 8:00 AM to 12:00 Noon) or if you have a scheduled appointment with us, prior to your surgery, and ask for it by name. In addition, you are responsible for: calling our office [phone], at any time of the day or night, and leaving a message stating your name, name of your surgeon, type of surgery, and date of procedure or surgery. Failure to comply with your responsibilities may result in termination of therapy involving the controlled substances. ?Medication Agreement Violation. Following the above rules, including your responsibilities will help you in avoiding a Medication Agreement Violation (?Breaking your Pain Medication Contract?). ? ?*Opioid medications include: morphine, codeine, oxycodone, oxymorphone, hydrocodone, hydromorphone, meperidine, tramadol, tapentadol, buprenorphine, fentanyl, methadone. ?**Benzodiazepine medications include: diazepam (Valium), alprazolam (Xanax), clonazepam (Klonopine), lorazepam (Ativan), clorazepate (Tranxene), chlordiazepoxide (Librium), estazolam (Prosom), oxazepam (Serax), temazepam (Restoril), triazolam (Halcion) ?(Last updated: 07/30/2021) ?____________________________________________________________________________________________ ? ____________________________________________________________________________________________ ? ?Medication Recommendations and Reminders ? ?Applies to: All patients receiving prescriptions (written and/or electronic). ? ?Medication Rules & Regulations: These rules and regulations exist for your safety and that of others. They are not flexible and neither are we. Dismissing or ignoring them will be considered "non-compliance" with medication therapy, resulting in complete and irreversible termination of such therapy. (See document titled "Medication Rules" for more details.) In all conscience,  because of safety reasons, we cannot continue providing a therapy where the patient does not follow instructions. ? ?Pharmacy of record:  ?Definition: This is the pharmacy where your electronic prescriptions w

## 2022-04-27 ENCOUNTER — Other Ambulatory Visit: Payer: Self-pay

## 2022-04-27 ENCOUNTER — Ambulatory Visit: Payer: No Typology Code available for payment source | Attending: Pain Medicine | Admitting: Pain Medicine

## 2022-04-27 ENCOUNTER — Encounter: Payer: Self-pay | Admitting: Pain Medicine

## 2022-04-27 VITALS — BP 124/69 | HR 62 | Temp 97.8°F | Resp 16 | Ht 67.0 in | Wt 154.0 lb

## 2022-04-27 DIAGNOSIS — M542 Cervicalgia: Secondary | ICD-10-CM | POA: Insufficient documentation

## 2022-04-27 DIAGNOSIS — M25552 Pain in left hip: Secondary | ICD-10-CM | POA: Insufficient documentation

## 2022-04-27 DIAGNOSIS — M25562 Pain in left knee: Secondary | ICD-10-CM | POA: Diagnosis present

## 2022-04-27 DIAGNOSIS — G8929 Other chronic pain: Secondary | ICD-10-CM | POA: Insufficient documentation

## 2022-04-27 DIAGNOSIS — M79604 Pain in right leg: Secondary | ICD-10-CM | POA: Diagnosis present

## 2022-04-27 DIAGNOSIS — M5441 Lumbago with sciatica, right side: Secondary | ICD-10-CM | POA: Diagnosis present

## 2022-04-27 DIAGNOSIS — M79621 Pain in right upper arm: Secondary | ICD-10-CM | POA: Insufficient documentation

## 2022-04-27 DIAGNOSIS — G894 Chronic pain syndrome: Secondary | ICD-10-CM | POA: Insufficient documentation

## 2022-04-27 DIAGNOSIS — M79622 Pain in left upper arm: Secondary | ICD-10-CM | POA: Diagnosis present

## 2022-04-27 DIAGNOSIS — Z79899 Other long term (current) drug therapy: Secondary | ICD-10-CM | POA: Insufficient documentation

## 2022-04-27 DIAGNOSIS — M79605 Pain in left leg: Secondary | ICD-10-CM | POA: Diagnosis present

## 2022-04-27 DIAGNOSIS — M961 Postlaminectomy syndrome, not elsewhere classified: Secondary | ICD-10-CM | POA: Diagnosis present

## 2022-04-27 DIAGNOSIS — M5442 Lumbago with sciatica, left side: Secondary | ICD-10-CM | POA: Insufficient documentation

## 2022-04-27 DIAGNOSIS — M25551 Pain in right hip: Secondary | ICD-10-CM | POA: Insufficient documentation

## 2022-04-27 DIAGNOSIS — M25561 Pain in right knee: Secondary | ICD-10-CM | POA: Insufficient documentation

## 2022-04-27 DIAGNOSIS — Z79891 Long term (current) use of opiate analgesic: Secondary | ICD-10-CM | POA: Insufficient documentation

## 2022-04-27 MED ORDER — HYDROCODONE-ACETAMINOPHEN 10-325 MG PO TABS: 1.0000 | ORAL_TABLET | Freq: Two times a day (BID) | ORAL | 0 refills | Status: DC | PRN

## 2022-05-01 LAB — TOXASSURE SELECT 13 (MW), URINE

## 2022-05-11 ENCOUNTER — Other Ambulatory Visit: Payer: Self-pay

## 2022-05-11 ENCOUNTER — Telehealth: Payer: Self-pay | Admitting: Pain Medicine

## 2022-05-11 DIAGNOSIS — M961 Postlaminectomy syndrome, not elsewhere classified: Secondary | ICD-10-CM

## 2022-05-11 DIAGNOSIS — M542 Cervicalgia: Secondary | ICD-10-CM

## 2022-05-11 DIAGNOSIS — Z79899 Other long term (current) drug therapy: Secondary | ICD-10-CM

## 2022-05-11 DIAGNOSIS — G894 Chronic pain syndrome: Secondary | ICD-10-CM

## 2022-05-11 DIAGNOSIS — M79621 Pain in right upper arm: Secondary | ICD-10-CM

## 2022-05-11 DIAGNOSIS — G8929 Other chronic pain: Secondary | ICD-10-CM

## 2022-05-11 DIAGNOSIS — Z79891 Long term (current) use of opiate analgesic: Secondary | ICD-10-CM

## 2022-05-11 MED ORDER — HYDROCODONE-ACETAMINOPHEN 10-325 MG PO TABS: 1.0000 | ORAL_TABLET | Freq: Two times a day (BID) | ORAL | 0 refills | Status: DC | PRN

## 2022-05-11 NOTE — Telephone Encounter (Signed)
Patient lvmail and stated she needs [facility] meds [facility] [place]. Did not leave what medicine.

## 2022-05-11 NOTE — Telephone Encounter (Signed)
Her current pharmacy does not have Hydrocodone in stock. [facility] called [facility] [place] last week, they did have it in stock. I instructed her to call again this a.m.  so that I can send Rx request to Dr. Dossie Arbour.

## 2022-05-11 NOTE — Telephone Encounter (Signed)
Refill request sent to Dr Naveira  

## 2022-05-12 ENCOUNTER — Telehealth: Payer: Self-pay | Admitting: Pain Medicine

## 2022-05-12 ENCOUNTER — Other Ambulatory Visit: Payer: Self-pay | Admitting: *Deleted

## 2022-05-12 DIAGNOSIS — M79621 Pain in right upper arm: Secondary | ICD-10-CM

## 2022-05-12 DIAGNOSIS — M542 Cervicalgia: Secondary | ICD-10-CM

## 2022-05-12 DIAGNOSIS — M961 Postlaminectomy syndrome, not elsewhere classified: Secondary | ICD-10-CM

## 2022-05-12 DIAGNOSIS — G8929 Other chronic pain: Secondary | ICD-10-CM

## 2022-05-12 DIAGNOSIS — G894 Chronic pain syndrome: Secondary | ICD-10-CM

## 2022-05-12 DIAGNOSIS — Z79891 Long term (current) use of opiate analgesic: Secondary | ICD-10-CM

## 2022-05-12 DIAGNOSIS — Z79899 Other long term (current) drug therapy: Secondary | ICD-10-CM

## 2022-05-12 MED ORDER — HYDROCODONE-ACETAMINOPHEN 10-325 MG PO TABS: 1.0000 | ORAL_TABLET | Freq: Two times a day (BID) | ORAL | 0 refills | Status: DC | PRN

## 2022-05-12 NOTE — Telephone Encounter (Signed)
Returned patient phone call. She states that [facility] does not have her Rx that was d/t fill on yesterday.  I told patient that [facility] would [facility].  They report that they do not have an Rx for hydrocodone - apap 10-325 mg.  Pharmacist reports that he can see an Rx processing, [facility] [place] [place].  They are out of qty and are going to cancel that Rx.  I will send another Med refill request to [facility] and ask him to please resend.

## 2022-05-12 NOTE — Telephone Encounter (Signed)
Patient aware.

## 2022-05-12 NOTE — Telephone Encounter (Signed)
Patient wants to see if her refilled had been called. Patient stated that she is out and figured it would been called in yesterday. Please give patient a call. Thanks

## 2022-07-20 ENCOUNTER — Other Ambulatory Visit: Payer: Self-pay | Admitting: Gerontology

## 2022-07-20 DIAGNOSIS — Z1231 Encounter for screening mammogram for malignant neoplasm of breast: Secondary | ICD-10-CM

## 2022-07-23 NOTE — Progress Notes (Signed)
PROVIDER NOTE: Information contained herein reflects review and annotations entered in association with encounter. Interpretation of such information and data should be left to medically-trained personnel. Information provided to patient can be located elsewhere in the medical record under "Patient Instructions". Document created using STT-dictation technology, any transcriptional errors that may result from process are unintentional.    Patient: Stephanie [name]  Service Category: E/M  Provider: Gaspar Cola, MD  DOB: [birth year]  DOS: 07/27/2022  Referring Provider: Latanya Maudlin, NP  MRN: [id number]  Specialty: Interventional Pain Management  PCP: Latanya Maudlin, NP  Type: Established Patient  Setting: Ambulatory outpatient    Location: Office  Delivery: Face-to-face     HPI  Stephanie [name], a 61 y.o. year old female, is here today because of her Chronic pain syndrome [G89.4]. Stephanie [name] primary complain today is Back Pain (low) Last encounter: My last encounter with her was on 05/12/2022. 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); Musculoskeletal pain; Neurogenic pain; Spasm of paraspinal muscle; Chronic hip pain (Bilateral) (R>L); Osteoarthritis of knee (Bilateral); Osteoarthritis of hip (Bilateral); Osteoarthritis of sacroiliac joint (Bilateral); Chronic sacroiliac joint pain (Bilateral); Chronic pain syndrome; Failed back surgical syndrome; Eczema of hands (Bilateral) (R>L);  Abnormal MRI, knee (04/04/2018); 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 Pain Assessment: Severity of Chronic pain is reported as a 7 /10. Location: Back Lower/radiates down the front of both legs to toes. Onset: More than a month ago. Quality: Constant, Sharp, Stabbing. Timing: Constant. Modifying factor(s): meds. Vitals:  height is 5' 7" (1.702 m) and weight is 155 lb (70.3 kg). Her temperature is 97 F (36.1 C) (abnormal). Her blood pressure is 126/80 and her pulse is 77. Her respiration is 16 and oxygen saturation is 100%.   Reason for encounter: medication management.  The patient indicates doing well with the current medication regimen. No adverse reactions or side effects reported to the medications.  Today the patient was informed about updates on opioid analgesic prescribing for chronic pain.  RTCB: 10/31/2022.  Based on the patient's pill count, she should have enough medication to last until 08/08/2022, at which time she should be able to then fill her next prescription. Nonopioids transferred 10/07/2020: Mobic, Neurontin, Flexeril, melatonin, and triamcinolone ointment.  Pharmacotherapy Assessment  Analgesic: Hydrocodone/acetaminophen 10/325 mg, 1 tab PO BID (20 mg/day of hydrocodone) MME/day: 20 mg/day.   Monitoring: [place] PMP: PDMP reviewed during this encounter.       Pharmacotherapy: No side-effects or adverse reactions reported. Compliance: No problems identified. Effectiveness: Clinically acceptable.  Dewayne Shorter, RN  07/27/2022  8:21 AM  Sign when Signing Visit Nursing Pain Medication Assessment:  Safety precautions to be maintained throughout the outpatient stay will include: orient to surroundings, keep  bed in low position, maintain call bell within reach at all times, provide assistance with transfer out of bed and ambulation.  Medication Inspection  Compliance: Pill count conducted under aseptic conditions, in front of the patient. Neither the pills nor the bottle was removed from the patient's sight at any time. Once count was completed pills were immediately returned to the patient in their original bottle.  Medication: See above Pill/Patch Count:  24.5 of 45 pills remain Pill/Patch Appearance: Markings consistent with prescribed medication Bottle Appearance: Standard pharmacy container. Clearly labeled. Filled Date: 09 / 11 / 2023 Last Medication intake:  Today    No results found for: "CBDTHCR" No results found for: "D8THCCBX" No results found for: "D9THCCBX"  UDS:  Summary  Date Value Ref Range Status  04/27/2022 Note  Final    Comment:    ==================================================================== [employer] 13 (MW) ==================================================================== Test                             Result       Flag       Units  Drug Present and Declared for Prescription Verification   Hydrocodone                    691          EXPECTED   ng/mg creat   Hydromorphone                  206          EXPECTED   ng/mg creat   Norhydrocodone                 2196         EXPECTED   ng/mg creat    Sources of hydrocodone include scheduled prescription medications.    Hydromorphone and norhydrocodone are expected metabolites of    hydrocodone. Hydromorphone is also available as a scheduled    prescription medication.  ==================================================================== Test                      Result    Flag   Units      Ref Range   Creatinine              89               mg/dL      >=20 ==================================================================== Declared Medications:  The flagging and interpretation on this report are  based on the  following declared medications.  Unexpected results may arise from  inaccuracies in the declared medications.   **Note: The testing scope of this panel includes these medications:   Hydrocodone (Norco)   **Note: The testing scope of this panel does not include the  following reported medications:   Acetaminophen (Norco)  Cholecalciferol  Cyanocobalamin  Cyclobenzaprine (Flexeril)  Dulaglutide (Trulicity)  Escitalopram (Lexapro)  Gabapentin (Neurontin)  Ibuprofen (Advil)  Melatonin  Meloxicam (Mobic)  Metformin  Triamcinolone (Kenalog) ==================================================================== For clinical consultation, please call [phone]. ====================================================================       ROS  Constitutional: Denies any fever or chills Gastrointestinal: No reported hemesis, hematochezia, vomiting, or acute GI distress Musculoskeletal: Denies any acute onset joint swelling, redness, loss of ROM, or weakness Neurological: No reported episodes of acute onset apraxia, aphasia, dysarthria, agnosia, amnesia, paralysis, loss of coordination, or loss of consciousness  Medication Review  Cholecalciferol, Dulaglutide, HYDROcodone-acetaminophen, cyanocobalamin, cyclobenzaprine, escitalopram, gabapentin, ibuprofen, meloxicam, metFORMIN, and triamcinolone ointment  History Review  Allergy: Ms. Bywater is allergic to phenergan [promethazine hcl]. Drug: Ms. Dubose  reports no history of drug use.  Alcohol:  reports no history of alcohol use. Tobacco:  reports that she has never smoked. She has never used smokeless tobacco. Social: Ms. Wilmott  reports that she has never smoked. She has never used smokeless tobacco. She reports that she does not drink alcohol and does not use drugs. Medical:  has a past medical history of Allergy, Anemia, iron deficiency (04/09/2014), Arthritis, Atypical chest pain (04/09/2014), CD (contact dermatitis)  (03/01/2014), Chest pain (04/09/2014), Diabetes mellitus without complication ([place]), and Lumbar radicular pain (08/22/2015). Surgical: Ms. Pereida  has a past surgical history that includes Back surgery; Appendectomy; Cholecystectomy; Cesarean section; and Bunionectomy. Family: family history includes Asthma in her father; Stroke in her mother.  Laboratory Chemistry Profile   Renal Lab Results  Component Value Date   BUN 10 11/13/2020   CREATININE 0.44 11/13/2020   GFRAA >60 09/01/2019   GFRNONAA >60 11/13/2020    Hepatic Lab Results  Component Value Date   AST 15 09/01/2019   ALT 13 09/01/2019   ALBUMIN 4.2 09/01/2019   ALKPHOS 61 09/01/2019    Electrolytes Lab Results  Component Value Date   NA 137 11/13/2020   K 3.9 11/13/2020   CL 103 11/13/2020   CALCIUM 8.8 (L) 11/13/2020   MG 1.9 09/01/2019    Bone Lab Results  Component Value Date   VD25OH 33.18 09/01/2019   25OHVITD1 34 05/07/2016   25OHVITD2 <1.0 05/07/2016   25OHVITD3 34 05/07/2016    Inflammation (CRP: Acute Phase) (ESR: Chronic Phase) Lab Results  Component Value Date   CRP <0.8 09/01/2019   ESRSEDRATE 18 09/01/2019         Note: Above Lab results reviewed.  Recent Imaging Review  DG Chest 2 View CLINICAL DATA:  Chest pain.  EXAM: CHEST - 2 VIEW  COMPARISON:  Chest x-ray 04/09/2014.  FINDINGS: Mediastinum and hilar structures normal. Heart size normal. Mild left base atelectasis and or scarring again noted. No interim change. No acute infiltrate. No pleural effusion or pneumothorax. No acute bony abnormality. Degenerative change thoracic spine.  IMPRESSION: Mild left base atelectasis and or scarring again noted. No interim change. No acute cardiopulmonary disease identified.  Electronically Signed   By: Marcello Moores  Register   On: 11/13/2020 06:54 Note: Reviewed        Physical Exam  General appearance: Well nourished, well developed, and well hydrated. In no apparent acute distress Mental  status: Alert, oriented x 3 (person, place, & time)       Respiratory: No evidence of acute respiratory distress Eyes: PERLA Vitals: BP 126/80   Pulse 77   Temp (!) 97 F (36.1 C)   Resp 16   Ht 5' 7" (1.702 m)   Wt 155 lb (70.3 kg)   LMP  (LMP Unknown)   SpO2 100%   BMI 24.28 kg/m  BMI: Estimated body mass index is 24.28 kg/m as calculated from the following:   Height as of this encounter: 5' 7" (1.702 m).   Weight as of this encounter: 155 lb (70.3 kg). Ideal: Ideal body weight: 61.6 kg (135 lb 12.9 oz) Adjusted ideal body weight: 65.1 kg (143 lb 7.7 oz)  Assessment   Diagnosis Status  1. Chronic pain syndrome   2. Chronic low back pain (1ry area of Pain) (Bilateral) (L>R)   3. Chronic lower extremity pain (2ry area of Pain) (Bilateral) (L>R)   4. Cervicalgia   5. Chronic upper extremity pain (Bilateral)   6. Failed back surgical syndrome   7. Chronic hip  pain (Bilateral) (R>L)   8. Chronic knee pain (3ry area of Pain) (Bilateral) (R>L)   9. Pharmacologic therapy   10. Chronic use of opiate for therapeutic purpose   11. Encounter for medication management   12. Encounter for chronic pain management    Controlled Controlled Controlled   Updated Problems: No problems updated.   Plan of Care  Problem-specific:  No problem-specific Assessment & Plan notes found for this encounter.  Ms. Taryn Shellhammer has a current medication list which includes the following long-term medication(s): escitalopram, [START ON 08/08/2022] hydrocodone-acetaminophen, [START ON 09/07/2022] hydrocodone-acetaminophen, [START ON 10/07/2022] hydrocodone-acetaminophen, metformin, cyclobenzaprine, gabapentin, meloxicam, and triamcinolone ointment.  Pharmacotherapy (Medications Ordered): Meds ordered this encounter  Medications   HYDROcodone-acetaminophen (NORCO) 10-325 MG tablet    Sig: Take 1 tablet by mouth every 12 (twelve) hours as needed for severe pain. Must last 30 days    Dispense:   45 tablet    Refill:  0    DO NOT: delete (not duplicate); no partial-fill (will deny script to complete), no refill request (F/U required). DISPENSE: 1 day early if closed on fill date. WARN: No CNS-depressants within 8 hrs of med.   HYDROcodone-acetaminophen (NORCO) 10-325 MG tablet    Sig: Take 1 tablet by mouth every 12 (twelve) hours as needed for severe pain. Must last 30 days    Dispense:  45 tablet    Refill:  0    DO NOT: delete (not duplicate); no partial-fill (will deny script to complete), no refill request (F/U required). DISPENSE: 1 day early if closed on fill date. WARN: No CNS-depressants within 8 hrs of med.   HYDROcodone-acetaminophen (NORCO) 10-325 MG tablet    Sig: Take 1 tablet by mouth every 12 (twelve) hours as needed for severe pain. Must last 30 days    Dispense:  45 tablet    Refill:  0    DO NOT: delete (not duplicate); no partial-fill (will deny script to complete), no refill request (F/U required). DISPENSE: 1 day early if closed on fill date. WARN: No CNS-depressants within 8 hrs of med.   Orders:  No orders of the defined types were placed in this encounter.  Follow-up plan:   Return in about 3 months (around 11/06/2022) for Eval-day (M,W), (F2F), (MM).     Interventional Therapies  Risk  Complexity Considerations:   Estimated body mass index is 27.25 kg/m as calculated from the following:   Height as of this encounter: 5' 7" (1.702 m).   Weight as of this encounter: 174 lb (78.9 kg). WNL   Planned  Pending:   Diagnostic right caudal ESI #2    Under consideration:   Diagnostic bilateral lumbar facet MBB Diagnostic Caudal ESI Diagnostic Bilateral L4-5 & L5-S1Lumbar TFESI Diagnostic bilateral sacroiliac joint Blk Diagnostic bilateral hip joint injection Diagnostic bilateral genicular NB Possible Racz procedure    Completed:   Palliative Caudal ESI x1 (02/01/2017) (100/100/80/80)   Completed by other providers:   Therapeutic right knee IA steroid  inj. x6 (05/14/2020, 07/22/2020, 08/12/2020, 09/15/2021, 12/29/2021, 04/07/2022) by Ellard Artis, PA-C  Therapeutic left knee IA steroid inj. x4 (04/29/2020, 07/22/2020, 08/12/2020, 09/29/2021) by Ellard Artis, PA-C    Therapeutic  Palliative (PRN) options:   Palliative Caudal ESI #2     Recent Visits No visits were found meeting these conditions. Showing recent visits within past 90 days and meeting all other requirements Today's Visits Date Type Provider Dept  07/27/22 Office Visit Milinda Pointer, MD [facility]  Showing today's visits and meeting all other requirements Future Appointments No visits were found meeting these conditions. Showing future appointments within next 90 days and meeting all other requirements  I discussed the assessment and treatment plan with the patient. The patient was provided an opportunity to ask questions and all were answered. The patient agreed with the plan and demonstrated an understanding of the instructions.  Patient advised to call back or seek an in-person evaluation if the symptoms or condition worsens.  Duration of encounter: 30 minutes.  Total time on encounter, as per AMA guidelines included both the face-to-face and non-face-to-face time personally spent by the physician and/or other qualified health care professional(s) on the day of the encounter (includes time in activities that require the physician or other qualified health care professional and does not include time in activities normally performed by clinical staff). Physician's time may include the following activities when performed: preparing to see the patient (eg, review of tests, pre-charting review of records) obtaining and/or reviewing separately obtained history performing a medically appropriate examination and/or evaluation counseling and educating the patient/family/caregiver ordering medications, tests, or procedures referring and communicating with other health care  professionals (when not separately reported) documenting clinical information in the electronic or other health record independently interpreting results (not separately reported) and communicating results to the patient/ family/caregiver care coordination (not separately reported)  Note by: Gaspar Cola, MD Date: 07/27/2022; Time: 10:19 AM

## 2022-07-27 ENCOUNTER — Encounter: Payer: Self-pay | Admitting: Pain Medicine

## 2022-07-27 ENCOUNTER — Ambulatory Visit: Payer: No Typology Code available for payment source | Attending: Pain Medicine | Admitting: Pain Medicine

## 2022-07-27 VITALS — BP 126/80 | HR 77 | Temp 97.0°F | Resp 16 | Ht 67.0 in | Wt 155.0 lb

## 2022-07-27 DIAGNOSIS — M25551 Pain in right hip: Secondary | ICD-10-CM | POA: Diagnosis present

## 2022-07-27 DIAGNOSIS — M79622 Pain in left upper arm: Secondary | ICD-10-CM | POA: Insufficient documentation

## 2022-07-27 DIAGNOSIS — M79604 Pain in right leg: Secondary | ICD-10-CM | POA: Insufficient documentation

## 2022-07-27 DIAGNOSIS — M79605 Pain in left leg: Secondary | ICD-10-CM | POA: Diagnosis present

## 2022-07-27 DIAGNOSIS — G8929 Other chronic pain: Secondary | ICD-10-CM | POA: Diagnosis present

## 2022-07-27 DIAGNOSIS — M79621 Pain in right upper arm: Secondary | ICD-10-CM | POA: Diagnosis present

## 2022-07-27 DIAGNOSIS — M542 Cervicalgia: Secondary | ICD-10-CM | POA: Diagnosis not present

## 2022-07-27 DIAGNOSIS — M25562 Pain in left knee: Secondary | ICD-10-CM | POA: Insufficient documentation

## 2022-07-27 DIAGNOSIS — M25561 Pain in right knee: Secondary | ICD-10-CM | POA: Insufficient documentation

## 2022-07-27 DIAGNOSIS — M5442 Lumbago with sciatica, left side: Secondary | ICD-10-CM | POA: Insufficient documentation

## 2022-07-27 DIAGNOSIS — M25552 Pain in left hip: Secondary | ICD-10-CM | POA: Diagnosis present

## 2022-07-27 DIAGNOSIS — Z79891 Long term (current) use of opiate analgesic: Secondary | ICD-10-CM | POA: Diagnosis present

## 2022-07-27 DIAGNOSIS — M5441 Lumbago with sciatica, right side: Secondary | ICD-10-CM | POA: Insufficient documentation

## 2022-07-27 DIAGNOSIS — M961 Postlaminectomy syndrome, not elsewhere classified: Secondary | ICD-10-CM | POA: Diagnosis present

## 2022-07-27 DIAGNOSIS — Z79899 Other long term (current) drug therapy: Secondary | ICD-10-CM | POA: Diagnosis present

## 2022-07-27 DIAGNOSIS — G894 Chronic pain syndrome: Secondary | ICD-10-CM | POA: Diagnosis not present

## 2022-07-27 MED ORDER — HYDROCODONE-ACETAMINOPHEN 10-325 MG PO TABS: 1.0000 | ORAL_TABLET | Freq: Two times a day (BID) | ORAL | 0 refills | Status: DC | PRN

## 2022-07-27 NOTE — Progress Notes (Signed)
Nursing Pain Medication Assessment:  Safety precautions to be maintained throughout the outpatient stay will include: orient to surroundings, keep bed in low position, maintain call bell within reach at all times, provide assistance with transfer out of bed and ambulation.  Medication Inspection Compliance: Pill count conducted under aseptic conditions, in front of the patient. Neither the pills nor the bottle was removed from the patient's sight at any time. Once count was completed pills were immediately returned to the patient in their original bottle.  Medication: See above Pill/Patch Count:  24.5 of 45 pills remain Pill/Patch Appearance: Markings consistent with prescribed medication Bottle Appearance: Standard pharmacy container. Clearly labeled. Filled Date: 09 / 11 / 2023 Last Medication intake:  Today

## 2022-07-27 NOTE — Patient Instructions (Signed)

## 2022-08-17 ENCOUNTER — Other Ambulatory Visit: Payer: Self-pay | Admitting: *Deleted

## 2022-08-17 ENCOUNTER — Telehealth: Payer: Self-pay | Admitting: Pain Medicine

## 2022-08-17 DIAGNOSIS — M542 Cervicalgia: Secondary | ICD-10-CM

## 2022-08-17 DIAGNOSIS — Z79891 Long term (current) use of opiate analgesic: Secondary | ICD-10-CM

## 2022-08-17 DIAGNOSIS — G8929 Other chronic pain: Secondary | ICD-10-CM

## 2022-08-17 DIAGNOSIS — Z79899 Other long term (current) drug therapy: Secondary | ICD-10-CM

## 2022-08-17 DIAGNOSIS — M961 Postlaminectomy syndrome, not elsewhere classified: Secondary | ICD-10-CM

## 2022-08-17 DIAGNOSIS — M79621 Pain in right upper arm: Secondary | ICD-10-CM

## 2022-08-17 DIAGNOSIS — G894 Chronic pain syndrome: Secondary | ICD-10-CM

## 2022-08-17 MED ORDER — HYDROCODONE-ACETAMINOPHEN 10-325 MG PO TABS: 1.0000 | ORAL_TABLET | Freq: Two times a day (BID) | ORAL | 0 refills | Status: DC | PRN

## 2022-08-17 NOTE — Telephone Encounter (Signed)
PT wants prescription to [facility] in [place]. Due to her currently pharmacy that she use is out of the medication that she needs. Thanks

## 2022-08-17 NOTE — Telephone Encounter (Signed)
Notified patient per voicemail that Rx has been sent to requested pharmacy.

## 2022-08-17 NOTE — Telephone Encounter (Signed)
Rx request sent to Dr. Naveira. 

## 2022-08-24 ENCOUNTER — Telehealth: Payer: Self-pay

## 2022-08-24 NOTE — Telephone Encounter (Signed)
She cant find any pharmacy that has her medicines so she wants to know if he can call her in something different. Im still not sure that the rule is for this.

## 2022-08-24 NOTE — Telephone Encounter (Signed)
Patient informed that no changes in opioids will be made.

## 2022-09-02 ENCOUNTER — Other Ambulatory Visit: Payer: Self-pay | Admitting: *Deleted

## 2022-09-02 ENCOUNTER — Telehealth: Payer: Self-pay | Admitting: Pain Medicine

## 2022-09-02 DIAGNOSIS — G894 Chronic pain syndrome: Secondary | ICD-10-CM

## 2022-09-02 DIAGNOSIS — M961 Postlaminectomy syndrome, not elsewhere classified: Secondary | ICD-10-CM

## 2022-09-02 DIAGNOSIS — M79621 Pain in right upper arm: Secondary | ICD-10-CM

## 2022-09-02 DIAGNOSIS — M542 Cervicalgia: Secondary | ICD-10-CM

## 2022-09-02 DIAGNOSIS — Z79891 Long term (current) use of opiate analgesic: Secondary | ICD-10-CM

## 2022-09-02 DIAGNOSIS — Z79899 Other long term (current) drug therapy: Secondary | ICD-10-CM

## 2022-09-02 DIAGNOSIS — G8929 Other chronic pain: Secondary | ICD-10-CM

## 2022-09-02 MED ORDER — HYDROCODONE-ACETAMINOPHEN 10-325 MG PO TABS: 1.0000 | ORAL_TABLET | Freq: Two times a day (BID) | ORAL | 0 refills | Status: DC | PRN

## 2022-09-02 NOTE — Telephone Encounter (Signed)
PT will like for her prescription to be filled at [facility] in [place]. Please give patient a call. Thanks

## 2022-09-02 NOTE — Telephone Encounter (Signed)
Patient notified

## 2022-09-02 NOTE — Telephone Encounter (Signed)
Refill request sent to Dr Naveira  

## 2022-10-28 ENCOUNTER — Encounter: Payer: Self-pay | Admitting: Pain Medicine

## 2022-11-04 ENCOUNTER — Encounter: Payer: Self-pay | Admitting: Pain Medicine

## 2022-11-08 NOTE — Progress Notes (Unsigned)
PROVIDER NOTE: Information contained herein reflects review and annotations entered in association with encounter. Interpretation of such information and data should be left to medically-trained personnel. Information provided to patient can be located elsewhere in the medical record under "Patient Instructions". Document created using STT-dictation technology, any transcriptional errors that may result from process are unintentional.    Patient: Stephanie [name]  Service Category: E/M  Provider: Gaspar Cola, MD  DOB: 02/20/1961  DOS: 11/09/2022  Referring Provider: Latanya Maudlin, NP  MRN: [id number]  Specialty: Interventional Pain Management  PCP: Stephanie Maudlin, NP  Type: Established Patient  Setting: Ambulatory outpatient    Location: Office  Delivery: Face-to-face     HPI  [name], a 62 y.o. year old female, is here today because of her No primary diagnosis found.. Stephanie [name]'s primary complain today is No chief complaint on file. Last encounter: My last encounter with her was on 09/02/2022. 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); Musculoskeletal pain; Neurogenic pain; Spasm of paraspinal muscle; Chronic hip pain (Bilateral) (R>L); Osteoarthritis of knee (Bilateral); Osteoarthritis of hip (Bilateral); Osteoarthritis of sacroiliac joint (Bilateral); Chronic sacroiliac joint pain (Bilateral); Chronic pain syndrome; Failed back surgical syndrome; Eczema of hands (Bilateral)  (R>L); Abnormal MRI, knee (04/04/2018); 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 Pain Assessment: Severity of   is reported as a  /10. Location:    / . Onset:  . Quality:  . Timing:  . Modifying factor(s):  Marland Kitchen Vitals:  vitals were not taken for this visit.  BMI: Estimated body mass index is 24.28 kg/m as calculated from the following:   Height as of 07/27/22: '5\' 7"'$  (1.702 m).   Weight as of 07/27/22: 155 lb (70.3 kg).  Reason for encounter: medication management. ***    Pharmacotherapy Assessment  Analgesic: Hydrocodone/acetaminophen 10/325 mg, 1 tab PO BID (20 mg/day of hydrocodone) MME/day: 20 mg/day.   Monitoring: [name]: PDMP reviewed during this encounter.       Pharmacotherapy: No side-effects or adverse reactions reported. Compliance: No problems identified. Effectiveness: Clinically acceptable.  No notes on file  No results found for: "CBDTHCR" No results found for: "D8THCCBX" No results found for: "D9THCCBX"  UDS:  Summary  Date Value Ref Range Status  04/27/2022 Note  Final    Comment:    ==================================================================== [facility] (MW) ==================================================================== Test                             Result       Flag       Units  Drug Present and Declared for Prescription Verification   Hydrocodone                    691          EXPECTED   ng/mg creat  Hydromorphone                  206          EXPECTED   ng/mg creat   Norhydrocodone                 2196         EXPECTED   ng/mg creat    Sources of hydrocodone include scheduled prescription medications.    Hydromorphone and norhydrocodone are expected  metabolites of    hydrocodone. Hydromorphone is also available as a scheduled    prescription medication.  ==================================================================== Test                      Result    Flag   Units      Ref Range   Creatinine              89               mg/dL      >=20 ==================================================================== Declared Medications:  The flagging and interpretation on this report are based on the  following declared medications.  Unexpected results may arise from  inaccuracies in the declared medications.   **Note: The testing scope of this panel includes these medications:   Hydrocodone (Norco)   **Note: The testing scope of this panel does not include the  following reported medications:   Acetaminophen (Norco)  Cholecalciferol  Cyanocobalamin  Cyclobenzaprine (Flexeril)  Dulaglutide (Trulicity)  Escitalopram (Lexapro)  Gabapentin (Neurontin)  Ibuprofen (Advil)  Melatonin  Meloxicam (Mobic)  Metformin  Triamcinolone (Kenalog) ==================================================================== For clinical consultation, please call [phone]. ====================================================================       ROS  Constitutional: Denies any fever or chills Gastrointestinal: No reported hemesis, hematochezia, vomiting, or acute GI distress Musculoskeletal: Denies any acute onset joint swelling, redness, loss of ROM, or weakness Neurological: No reported episodes of acute onset apraxia, aphasia, dysarthria, agnosia, amnesia, paralysis, loss of coordination, or loss of consciousness  Medication Review  Cholecalciferol, Dulaglutide, HYDROcodone-acetaminophen, cyanocobalamin, cyclobenzaprine, escitalopram, gabapentin, ibuprofen, meloxicam, metFORMIN, and triamcinolone ointment  History Review  Allergy: Stephanie [name] is allergic to phenergan [promethazine hcl]. Drug: Stephanie [name]  reports no history of drug  use. Alcohol:  reports no history of alcohol use. Tobacco:  reports that she has never smoked. She has never used smokeless tobacco. Social: Stephanie [name]  reports that she has never smoked. She has never used smokeless tobacco. She reports that she does not drink alcohol and does not use drugs. Medical:  has a past medical history of Allergy, Anemia, iron deficiency (04/09/2014), Arthritis, Atypical chest pain (04/09/2014), CD (contact dermatitis) (03/01/2014), Chest pain (04/09/2014), Diabetes mellitus without complication ([place]), and Lumbar radicular pain (08/22/2015). Surgical: Ms. Win  has a past surgical history that includes Back surgery; Appendectomy; Cholecystectomy; Cesarean section; and Bunionectomy. Family: family history includes Asthma in her father; Stroke in her mother.  Laboratory Chemistry Profile   Renal Lab Results  Component Value Date   BUN 10 11/13/2020   CREATININE 0.44 11/13/2020   GFRAA >60 09/01/2019   GFRNONAA >60 11/13/2020    Hepatic Lab Results  Component Value Date   AST 15 09/01/2019   ALT 13 09/01/2019   ALBUMIN 4.2 09/01/2019   ALKPHOS 61 09/01/2019    Electrolytes Lab Results  Component Value Date   NA 137 11/13/2020   K 3.9 11/13/2020   CL 103 11/13/2020   CALCIUM 8.8 (L) 11/13/2020   MG 1.9 09/01/2019  Bone Lab Results  Component Value Date   VD25OH 33.18 09/01/2019   25OHVITD1 34 05/07/2016   25OHVITD2 <1.0 05/07/2016   25OHVITD3 34 05/07/2016    Inflammation (CRP: Acute Phase) (ESR: Chronic Phase) Lab Results  Component Value Date   CRP <0.8 09/01/2019   ESRSEDRATE 18 09/01/2019         Note: Above Lab results reviewed.  Recent Imaging Review  DG Chest 2 View CLINICAL DATA:  Chest pain.  EXAM: CHEST - 2 VIEW  COMPARISON:  Chest x-ray 04/09/2014.  FINDINGS: Mediastinum and hilar structures normal. Heart size normal. Mild left base atelectasis and or scarring again noted. No interim change. No acute infiltrate. No  pleural effusion or pneumothorax. No acute bony abnormality. Degenerative change thoracic spine.  IMPRESSION: Mild left base atelectasis and or scarring again noted. No interim change. No acute cardiopulmonary disease identified.  Electronically Signed   By: Marcello Moores  Register   On: 11/13/2020 06:54 Note: Reviewed        Physical Exam  General appearance: Well nourished, well developed, and well hydrated. In no apparent acute distress Mental status: Alert, oriented x 3 (person, place, & time)       Respiratory: No evidence of acute respiratory distress Eyes: PERLA Vitals: LMP  (LMP Unknown)  BMI: Estimated body mass index is 24.28 kg/m as calculated from the following:   Height as of 07/27/22: '5\' 7"'$  (1.702 m).   Weight as of 07/27/22: 155 lb (70.3 kg). Ideal: Patient weight not recorded  Assessment   Diagnosis Status  No diagnosis found. Controlled Controlled Controlled   Updated Problems: No problems updated.  Plan of Care  Problem-specific:  No problem-specific Assessment & Plan notes found for this encounter.  Ms. Reneka Nebergall has a current medication list which includes the following long-term medication(s): cyclobenzaprine, escitalopram, gabapentin, hydrocodone-acetaminophen, hydrocodone-acetaminophen, hydrocodone-acetaminophen, meloxicam, metformin, and triamcinolone ointment.  Pharmacotherapy (Medications Ordered): No orders of the defined types were placed in this encounter.  Orders:  No orders of the defined types were placed in this encounter.  Follow-up plan:   No follow-ups on file.     Interventional Therapies  Risk  Complexity Considerations:   Estimated body mass index is 27.25 kg/m as calculated from the following:   Height as of this encounter: '5\' 7"'$  (1.702 m).   Weight as of this encounter: 174 lb (78.9 kg). WNL   Planned  Pending:   Diagnostic right caudal ESI #2    Under consideration:   Diagnostic bilateral lumbar facet  MBB Diagnostic Caudal ESI Diagnostic Bilateral L4-5 & L5-S1Lumbar TFESI Diagnostic bilateral sacroiliac joint Blk Diagnostic bilateral hip joint injection Diagnostic bilateral genicular NB Possible Racz procedure    Completed:   Palliative Caudal ESI x1 (02/01/2017) (100/100/80/80)   Completed by other providers:   Therapeutic right knee IA steroid inj. x6 (05/14/2020, 07/22/2020, 08/12/2020, 09/15/2021, 12/29/2021, 04/07/2022) by Ellard Artis, PA-C  Therapeutic left knee IA steroid inj. x4 (04/29/2020, 07/22/2020, 08/12/2020, 09/29/2021) by Ellard Artis, PA-C    Therapeutic  Palliative (PRN) options:   Palliative Caudal ESI #2      Recent Visits No visits were found meeting these conditions. Showing recent visits within past 90 days and meeting all other requirements Future Appointments Date Type Provider Dept  11/09/22 Appointment Milinda Pointer, MD [facility]  Showing future appointments within next 90 days and meeting all other requirements  I discussed the assessment and treatment plan with the patient. The patient was provided an opportunity to ask  questions and all were answered. The patient agreed with the plan and demonstrated an understanding of the instructions.  Patient advised to call back or seek an in-person evaluation if the symptoms or condition worsens.  Duration of encounter: *** minutes.  Total time on encounter, as per AMA guidelines included both the face-to-face and non-face-to-face time personally spent by the physician and/or other qualified health care professional(s) on the day of the encounter (includes time in activities that require the physician or other qualified health care professional and does not include time in activities normally performed by clinical staff). Physician's time may include the following activities when performed: Preparing to see the patient (e.g., pre-charting review of records, searching for previously ordered imaging, lab  work, and nerve conduction tests) Review of prior analgesic pharmacotherapies. Reviewing PMP Interpreting ordered tests (e.g., lab work, imaging, nerve conduction tests) Performing post-procedure evaluations, including interpretation of diagnostic procedures Obtaining and/or reviewing separately obtained history Performing a medically appropriate examination and/or evaluation Counseling and educating the patient/family/caregiver Ordering medications, tests, or procedures Referring and communicating with other health care professionals (when not separately reported) Documenting clinical information in the electronic or other health record Independently interpreting results (not separately reported) and communicating results to the patient/ family/caregiver Care coordination (not separately reported)  Note by: Stephanie Cola, MD Date: 11/09/2022; Time: 5:57 PM

## 2022-11-09 ENCOUNTER — Telehealth: Payer: Self-pay | Admitting: *Deleted

## 2022-11-09 ENCOUNTER — Ambulatory Visit: Payer: No Typology Code available for payment source | Attending: Pain Medicine | Admitting: Pain Medicine

## 2022-11-09 ENCOUNTER — Encounter: Payer: Self-pay | Admitting: Pain Medicine

## 2022-11-09 VITALS — BP 134/73 | HR 98 | Temp 97.2°F | Ht 67.0 in | Wt 155.0 lb

## 2022-11-09 DIAGNOSIS — M5442 Lumbago with sciatica, left side: Secondary | ICD-10-CM | POA: Diagnosis not present

## 2022-11-09 DIAGNOSIS — M79621 Pain in right upper arm: Secondary | ICD-10-CM | POA: Diagnosis present

## 2022-11-09 DIAGNOSIS — M79605 Pain in left leg: Secondary | ICD-10-CM | POA: Insufficient documentation

## 2022-11-09 DIAGNOSIS — M25552 Pain in left hip: Secondary | ICD-10-CM | POA: Diagnosis present

## 2022-11-09 DIAGNOSIS — G894 Chronic pain syndrome: Secondary | ICD-10-CM | POA: Insufficient documentation

## 2022-11-09 DIAGNOSIS — M25562 Pain in left knee: Secondary | ICD-10-CM | POA: Diagnosis present

## 2022-11-09 DIAGNOSIS — Z79891 Long term (current) use of opiate analgesic: Secondary | ICD-10-CM | POA: Diagnosis present

## 2022-11-09 DIAGNOSIS — G8929 Other chronic pain: Secondary | ICD-10-CM | POA: Insufficient documentation

## 2022-11-09 DIAGNOSIS — Z79899 Other long term (current) drug therapy: Secondary | ICD-10-CM | POA: Diagnosis present

## 2022-11-09 DIAGNOSIS — M25561 Pain in right knee: Secondary | ICD-10-CM | POA: Diagnosis present

## 2022-11-09 DIAGNOSIS — M25551 Pain in right hip: Secondary | ICD-10-CM | POA: Diagnosis not present

## 2022-11-09 DIAGNOSIS — M961 Postlaminectomy syndrome, not elsewhere classified: Secondary | ICD-10-CM | POA: Insufficient documentation

## 2022-11-09 DIAGNOSIS — M5441 Lumbago with sciatica, right side: Secondary | ICD-10-CM | POA: Diagnosis not present

## 2022-11-09 DIAGNOSIS — M79604 Pain in right leg: Secondary | ICD-10-CM | POA: Insufficient documentation

## 2022-11-09 DIAGNOSIS — M79622 Pain in left upper arm: Secondary | ICD-10-CM | POA: Diagnosis present

## 2022-11-09 DIAGNOSIS — M542 Cervicalgia: Secondary | ICD-10-CM | POA: Diagnosis present

## 2022-11-09 MED ORDER — HYDROCODONE-ACETAMINOPHEN 10-325 MG PO TABS: 0.5000 | ORAL_TABLET | Freq: Two times a day (BID) | ORAL | 0 refills | Status: DC | PRN

## 2022-11-09 MED ORDER — NALOXONE HCL 4 MG/0.1ML NA LIQD: 1.0000 | NASAL | 0 refills | Status: AC | PRN

## 2022-11-09 NOTE — Progress Notes (Signed)
Nursing Pain Medication Assessment:  Safety precautions to be maintained throughout the outpatient stay will include: orient to surroundings, keep bed in low position, maintain call bell within reach at all times, provide assistance with transfer out of bed and ambulation.  Medication Inspection Compliance: Pill count conducted under aseptic conditions, in front of the patient. Neither the pills nor the bottle was removed from the patient's sight at any time. Once count was completed pills were immediately returned to the patient in their original bottle.  Medication: Hydrocodone/APAP Pill/Patch Count:  45 of 45 pills remain Pill/Patch Appearance: Markings consistent with prescribed medication Bottle Appearance: Standard pharmacy container. Clearly labeled. Filled Date: 1 / 5 / 2023 Last Medication intake:  TodaySafety precautions to be maintained throughout the outpatient stay will include: orient to surroundings, keep bed in low position, maintain call bell within reach at all times, provide assistance with transfer out of bed and ambulation.

## 2022-11-09 NOTE — Telephone Encounter (Signed)
Cancelled Rx for Hydrocodone, dated to be filled on 08-17-22.

## 2022-11-09 NOTE — Patient Instructions (Signed)
____________________________________________________________________________________________  Patient Information update  To: All of our patients.  Re: Name change.  It has been made official that our current name, "[facility]"   will soon be changed to "[facility]".   The purpose of this change is to eliminate any confusion created by the concept of our practice being a "Medication Management Pain Clinic". In the past this has led to the misconception that we treat pain primarily by the use of prescription medications.  Nothing can be farther from the truth.   Understanding PAIN MANAGEMENT: To further understand what our practice does, you first have to understand that "Pain Management" is a subspecialty that requires additional training once a physician has completed their specialty training, which can be in either Anesthesia, Neurology, Psychiatry, or Physical Medicine and Rehabilitation (PMR). Each one of these contributes to the final approach taken by each physician to the management of their patient's pain. To be a "Pain Management Specialist" you must have first completed one of the specialty trainings below.  Anesthesiologists - trained in clinical pharmacology and interventional techniques such as nerve blockade and regional as well as central neuroanatomy. They are trained to block pain before, during, and after surgical interventions.  Neurologists - trained in the diagnosis and pharmacological treatment of complex neurological conditions, such as Multiple Sclerosis, Parkinson's, spinal cord injuries, and other systemic conditions that may be associated with symptoms that may include but are not limited to pain. They tend to rely primarily on the treatment of chronic pain using prescription medications.  Psychiatrist - trained in conditions affecting the psychosocial  wellbeing of patients including but not limited to depression, anxiety, schizophrenia, personality disorders, addiction, and other substance use disorders that may be associated with chronic pain. They tend to rely primarily on the treatment of chronic pain using prescription medications.   Physical Medicine and Rehabilitation (PMR) physicians, also known as physiatrists - trained to treat a wide variety of medical conditions affecting the brain, spinal cord, nerves, bones, joints, ligaments, muscles, and tendons. Their training is primarily aimed at treating patients that have suffered injuries that have caused severe physical impairment. Their training is primarily aimed at the physical therapy and rehabilitation of those patients. They may also work alongside orthopedic surgeons or neurosurgeons using their expertise in assisting surgical patients to recover after their surgeries.  INTERVENTIONAL PAIN MANAGEMENT is sub-subspecialty of Pain Management.  Our physicians are Board-certified in Anesthesia, Pain Management, and Interventional Pain Management.  This meaning that not only have they been trained and Board-certified in their specialty of Anesthesia, and subspecialty of Pain Management, but they have also received further training in the sub-subspecialty of Interventional Pain Management, in order to become Board-certified as INTERVENTIONAL PAIN MANAGEMENT SPECIALIST.    Mission: Our goal is to use our skills in  INTERVENTIONAL PAIN MANAGEMENT as alternatives to the chronic use of prescription opioid medications for the treatment of pain. To make this more clear, we have changed our name to reflect what we do and offer. We will continue to offer medication management assessment and recommendations, but we will not be taking over any patient's medication management.  ____________________________________________________________________________________________      _______________________________________________________________________  Medication Rules  Purpose: To inform patients, and their family members, of our medication rules and regulations.  Applies to: All patients receiving prescriptions from our practice (written or electronic).  Pharmacy of record: This is the pharmacy where your electronic prescriptions   will be sent. Make sure we have the correct one.  Electronic prescriptions: In compliance with the [place] Strengthen Opioid Misuse Prevention (STOP) Act of 2017 (Session Law 2017-74/H243), effective November 02, 2018, all controlled substances must be electronically prescribed. Written prescriptions, faxing, or calling prescriptions to a pharmacy will no longer be done.  Prescription refills: These will be provided only during in-person appointments. No medications will be renewed without a "face-to-face" evaluation with your provider. Applies to all prescriptions.  NOTE: The following applies primarily to controlled substances (Opioid* Pain Medications).   Type of encounter (visit): For patients receiving controlled substances, face-to-face visits are required. (Not an option and not up to the patient.)  Patient's responsibilities: Pain Pills: Bring all pain pills to every appointment (except for procedure appointments). Pill Bottles: Bring pills in original pharmacy bottle. Bring bottle, even if empty. Always bring the bottle of the most recent fill.  Medication refills: You are responsible for knowing and keeping track of what medications you are taking and when is it that you will need a refill. The day before your appointment: write a list of all prescriptions that need to be refilled. The day of the appointment: give the list to the admitting nurse. Prescriptions will be written only during appointments. No prescriptions will be written on procedure days. If you forget a medication: it will not be "Called in", "Faxed", or  "electronically sent". You will need to get another appointment to get these prescribed. No early refills. Do not call asking to have your prescription filled early. Partial  or short prescriptions: Occasionally your pharmacy may not have enough pills to fill your prescription.  NEVER ACCEPT a partial fill or a prescription that is short of the total amount of pills that you were prescribed.  With controlled substances the law allows 72 hours for the pharmacy to complete the prescription.  If the prescription is not completed within 72 hours, the pharmacist will require a new prescription to be written. This means that you will be short on your medicine and we WILL NOT send another prescription to complete your original prescription.  Instead, request the pharmacy to send a carrier to a nearby branch to get enough medication to provide you with your full prescription. Prescription Accuracy: You are responsible for carefully inspecting your prescriptions before leaving our office. Have the discharge nurse carefully go over each prescription with you, before taking them home. Make sure that your name is accurately spelled, that your address is correct. Check the name and dose of your medication to make sure it is accurate. Check the number of pills, and the written instructions to make sure they are clear and accurate. Make sure that you are given enough medication to last until your next medication refill appointment. Taking Medication: Take medication as prescribed. When it comes to controlled substances, taking less pills or less frequently than prescribed is permitted and encouraged. Never take more pills than instructed. Never take the medication more frequently than prescribed.  Inform other Doctors: Always inform, all of your healthcare providers, of all the medications you take. Pain Medication from other Providers: You are not allowed to accept any additional pain medication from any other Doctor or  Healthcare provider. There are two exceptions to this rule. (see below) In the event that you require additional pain medication, you are responsible for notifying us, as stated below. Cough Medicine: Often these contain an opioid, such as codeine or hydrocodone. Never accept or take cough medicine containing   these opioids if you are already taking an opioid* medication. The combination may cause respiratory failure and death. Medication Agreement: You are responsible for carefully reading and following our Medication Agreement. This must be signed before receiving any prescriptions from our practice. Safely store a copy of your signed Agreement. Violations to the Agreement will result in no further prescriptions. (Additional copies of our Medication Agreement are available upon request.) Laws, Rules, & Regulations: All patients are expected to follow all Federal and State Laws, Statutes, Rules, & Regulations. Ignorance of the Laws does not constitute a valid excuse.  Illegal drugs and Controlled Substances: The use of illegal substances (including, but not limited to marijuana and its derivatives) and/or the illegal use of any controlled substances is strictly prohibited. Violation of this rule may result in the immediate and permanent discontinuation of any and all prescriptions being written by our practice. The use of any illegal substances is prohibited. Adopted CDC guidelines & recommendations: Target dosing levels will be at or below 60 MME/day. Use of benzodiazepines** is not recommended.  Exceptions: There are only two exceptions to the rule of not receiving pain medications from other Healthcare Providers. Exception #1 (Emergencies): In the event of an emergency (i.e.: accident requiring emergency care), you are allowed to receive additional pain medication. However, you are responsible for: As soon as you are able, call our office [phone], at any time of the day or night, and leave a  message stating your name, the date and nature of the emergency, and the name and dose of the medication prescribed. In the event that your call is answered by a member of our staff, make sure to document and save the date, time, and the name of the person that took your information.  Exception #2 (Planned Surgery): In the event that you are scheduled by another doctor or dentist to have any type of surgery or procedure, you are allowed (for a period no longer than 30 days), to receive additional pain medication, for the acute post-op pain. However, in this case, you are responsible for picking up a copy of our "Post-op Pain Management for Surgeons" handout, and giving it to your surgeon or dentist. This document is available at our office, and does not require an appointment to obtain it. Simply go to our office during business hours (Monday-Thursday from 8:00 AM to 4:00 PM) (Friday 8:00 AM to 12:00 Noon) or if you have a scheduled appointment with us, prior to your surgery, and ask for it by name. In addition, you are responsible for: calling our office [phone], at any time of the day or night, and leaving a message stating your name, name of your surgeon, type of surgery, and date of procedure or surgery. Failure to comply with your responsibilities may result in termination of therapy involving the controlled substances. Medication Agreement Violation. Following the above rules, including your responsibilities will help you in avoiding a Medication Agreement Violation ("Breaking your Pain Medication Contract").  Consequences:  Not following the above rules may result in permanent discontinuation of medication prescription therapy.  *Opioid medications include: morphine, codeine, oxycodone, oxymorphone, hydrocodone, hydromorphone, meperidine, tramadol, tapentadol, buprenorphine, fentanyl, methadone. **Benzodiazepine medications include: diazepam (Valium), alprazolam (Xanax), clonazepam (Klonopine),  lorazepam (Ativan), clorazepate (Tranxene), chlordiazepoxide (Librium), estazolam (Prosom), oxazepam (Serax), temazepam (Restoril), triazolam (Halcion) (Last updated: 08/25/2022) ______________________________________________________________________    ______________________________________________________________________  Medication Recommendations and Reminders  Applies to: All patients receiving prescriptions (written and/or electronic).  Medication Rules & Regulations: You are responsible   for reading, knowing, and following our "Medication Rules" document. These exist for your safety and that of others. They are not flexible and neither are we. Dismissing or ignoring them is an act of "non-compliance" that may result in complete and irreversible termination of such medication therapy. For safety reasons, "non-compliance" will not be tolerated. As with the [place] fundamental legal principle of "ignorance of the law is no defense", we will accept no excuses for not having read and knowing the content of documents provided to you by our practice.  Pharmacy of record:  Definition: This is the pharmacy where your electronic prescriptions will be sent.  We do not endorse any particular pharmacy. It is up to you and your insurance to decide what pharmacy to use.  We do not restrict you in your choice of pharmacy. However, once we write for your prescriptions, we will NOT be re-sending more prescriptions to fix restricted supply problems created by your pharmacy, or your insurance.  The pharmacy listed in the electronic medical record should be the one where you want electronic prescriptions to be sent. If you choose to change pharmacy, simply notify our nursing staff. Changes will be made only during your regular appointments and not over the phone.  Recommendations: Keep all of your pain medications in a safe place, under lock and key, even if you live alone. We will NOT replace lost, stolen, or  damaged medication. We do not accept "Police Reports" as proof of medications having been stolen. After you fill your prescription, take 1 week's worth of pills and put them away in a safe place. You should keep a separate, properly labeled bottle for this purpose. The remainder should be kept in the original bottle. Use this as your primary supply, until it runs out. Once it's gone, then you know that you have 1 week's worth of medicine, and it is time to come in for a prescription refill. If you do this correctly, it is unlikely that you will ever run out of medicine. To make sure that the above recommendation works, it is very important that you make sure your medication refill appointments are scheduled at least 1 week before you run out of medicine. To do this in an effective manner, make sure that you do not leave the office without scheduling your next medication management appointment. Always ask the nursing staff to show you in your prescription , when your medication will be running out. Then arrange for the receptionist to get you a return appointment, at least 7 days before you run out of medicine. Do not wait until you have 1 or 2 pills left, to come in. This is very poor planning and does not take into consideration that we may need to cancel appointments due to bad weather, sickness, or emergencies affecting our staff. DO NOT ACCEPT A "Partial Fill": If for any reason your pharmacy does not have enough pills/tablets to completely fill or refill your prescription, do not allow for a "partial fill". The law allows the pharmacy to complete that prescription within 72 hours, without requiring a new prescription. If they do not fill the rest of your prescription within those 72 hours, you will need a separate prescription to fill the remaining amount, which we will NOT provide. If the reason for the partial fill is your insurance, you will need to talk to the pharmacist about payment alternatives for  the remaining tablets, but again, DO NOT ACCEPT A PARTIAL FILL, unless you can trust   your pharmacist to obtain the remainder of the pills within 72 hours.  Prescription refills and/or changes in medication(s):  Prescription refills, and/or changes in dose or medication, will be conducted only during scheduled medication management appointments. (Applies to both, written and electronic prescriptions.) No refills on procedure days. No medication will be changed or started on procedure days. No changes, adjustments, and/or refills will be conducted on a procedure day. Doing so will interfere with the diagnostic portion of the procedure. No phone refills. No medications will be "called into the pharmacy". No Fax refills. No weekend refills. No Holliday refills. No after hours refills.  Remember:  Business hours are:  Monday to Thursday 8:00 AM to 4:00 PM Provider's Schedule: Milinda Pointer, MD - Appointments are:  Medication management: Monday and Wednesday 8:00 AM to 4:00 PM Procedure day: Tuesday and Thursday 7:30 AM to 4:00 PM Gillis Santa, MD - Appointments are:  Medication management: Tuesday and Thursday 8:00 AM to 4:00 PM Procedure day: Monday and Wednesday 7:30 AM to 4:00 PM (Last update: 08/25/2022) ______________________________________________________________________    ____________________________________________________________________________________________  National Pain Medication Shortage  The [place] is experiencing worsening drug shortages. These have had a negative widespread effect on patient care and treatment. Not expected to improve any time soon. Predicted to last past 2029.   Drug shortage list (generic names) Oxycodone IR Oxycodone/APAP Oxymorphone IR Hydromorphone Hydrocodone/APAP Morphine  Where is the problem?  Manufacturing and supply level.  Will this shortage affect you?  Only if you take any of the above pain medications.  How? You may be  unable to fill your prescription.  Your pharmacist may offer a "partial fill" of your prescription. (Warning: Do not accept partial fills.) Prescriptions partially filled cannot be transferred to another pharmacy. Read our Medication Rules and Regulation. Depending on how much medicine you are dependent on, you may experience withdrawals when unable to get the medication.  Recommendations: Consider ending your dependence on opioid pain medications. Ask your pain specialist to assist you with the process. Consider switching to a medication currently not in shortage, such as Buprenorphine. Talk to your pain specialist about this option. Consider decreasing your pain medication requirements by managing tolerance thru "Drug Holidays". This may help minimize withdrawals, should you run out of medicine. Control your pain thru the use of non-pharmacological interventional therapies.   Your prescriber: Prescribers cannot be blamed for shortages. Medication manufacturing and supply issues cannot be fixed by the prescriber.   NOTE: The prescriber is not responsible for supplying the medication, or solving supply issues. Work with your pharmacist to solve it. The patient is responsible for the decision to take or continue taking the medication and for identifying and securing a legal supply source. By law, supplying the medication is the job and responsibility of the pharmacy. The prescriber is responsible for the evaluation, monitoring, and prescribing of these medications.   Prescribers will NOT: Re-issue prescriptions that have been partially filled. Re-issue prescriptions already sent to a pharmacy.  Re-send prescriptions to a different pharmacy because yours did not have your medication. Ask pharmacist to order more medicine or transfer the prescription to another pharmacy. (Read below.)  New 2023 regulation: "July 03, 2022 Revised Regulation Allows DEA-Registered Pharmacies to Transfer  Electronic Prescriptions at a Patient's Request [place] Patients now have the ability to request their electronic prescription be transferred to another pharmacy without having to go back to their practitioner to initiate the request. This revised regulation went into  effect on Monday, June 29, 2022.     At a patient's request, a DEA-registered retail pharmacy can now transfer an electronic prescription for a controlled substance (schedules II-V) to another DEA-registered retail pharmacy. Prior to this change, patients would have to go through their practitioner to cancel their prescription and have it re-issued to a different pharmacy. The process was taxing and time consuming for both patients and practitioners.    The Drug Enforcement Administration [facility]) published its intent to revise the process for transferring electronic prescriptions on September 20, 2020.  The final rule was published in the federal register on May 28, 2022 and went into effect 30 days later.  Under the final rule, a prescription can only be transferred once between pharmacies, and only if allowed under existing state or other applicable law. The prescription must remain in its electronic form; may not be altered in any way; and the transfer must be communicated directly between two licensed pharmacists. It's important to note, any authorized refills transfer with the original prescription, which means the entire prescription will be filled at the same pharmacy".  Reference: [employer] [facility] website announcement)  [email or web address].pdf ([place])   [employer] / Vol. 88, No. 143 / Thursday, May 28, 2022 / Rules and Regulations DEPARTMENT OF JUSTICE  Drug Enforcement Administration  21 CFR Part  1306  [Docket No. [id number]]  RIN [id number] Transfer of Electronic Prescriptions for Schedules II-V Controlled Substances Between Pharmacies for Initial Filling  ____________________________________________________________________________________________     ____________________________________________________________________________________________  Drug Holidays  What is a "Drug Holiday"? Drug Holiday: is the name given to the process of slowly tapering down and temporarily stopping the pain medication for the purpose of decreasing or eliminating tolerance to the drug.  Benefits Improved effectiveness Decreased required effective dose Improved pain control End dependence on high dose therapy Decrease cost of therapy Uncovering "opioid-induced hyperalgesia". (OIH)  What is "opioid hyperalgesia"? It is a paradoxical increase in pain caused by exposure to opioids. Stopping the opioid pain medication, contrary to the expected, it actually decreases or completely eliminates the pain. Ref.: "A comprehensive review of opioid-induced hyperalgesia". Brion Aliment, et.al. Pain Physician. 2011 Mar-Apr;14(2):145-61.  What is tolerance? Tolerance: the progressive loss of effectiveness of a pain medicine due to repetitive use. A common problem of opioid pain medications.  How long should a "Drug Holiday" last? Effectiveness depends on the patient staying off all opioid pain medicines for a minimum of 14 consecutive days. (2 weeks)  How about just taking less of the medicine? Does not work. Will not accomplish goal of eliminating the excess receptors.  How about switching to a different pain medicine? (AKA. "Opioid rotation") Does not work. Creates the illusion of effectiveness by taking advantage of inaccurate equivalent dose calculations between different opioids. -This "technique" was promoted by studies funded by [employer], such as [employer], creators of  "OxyContin".  Can I stop the medicine "cold [place]"? Depends. You should always coordinate with your Pain Specialist to make the transition as smoothly as possible. Avoid stopping the medicine abruptly without consulting. We recommend a "slow taper".  What is a slow taper? Taper: refers to the gradual decrease in dose.   How do I stop/taper the dose? Slowly. Decrease the daily amount of pills that you take by one (1) pill every seven (7) days. This is called a "slow downward taper". Example: if you normally take four (4) pills per day, drop it to three (3) pills per day  for seven (7) days, then to two (2) pills per day for seven (7) days, then to one (1) per day for seven (7) days, and then stop the medicine. The 14 day "Drug Holiday" starts on the first day without medicine.   Will I experience withdrawals? Unlikely with a slow taper.  What triggers withdrawals? Withdrawals are triggered by the sudden/abrupt stop of high dose opioids. Withdrawals can be avoided by slowly decreasing the dose over a prolonged period of time.  What are withdrawals? Symptoms associated with sudden/abrupt reduction/stopping of high-dose, long-term use of pain medication. Withdrawal are seldom seen on low dose therapy, or patients rarely taking opioid medication.  Early Withdrawal Symptoms may include: Agitation Anxiety Muscle aches Increased tearing Insomnia Runny nose Sweating Yawning  Late symptoms may include: Abdominal cramping Diarrhea Dilated pupils Goose bumps Nausea Vomiting  (Last update: 10/11/2022) ____________________________________________________________________________________________    ____________________________________________________________________________________________  WARNING: CBD (cannabidiol) & Delta (Delta-8 tetrahydrocannabinol) products.   Applicable to:  All individuals currently taking or considering taking CBD (cannabidiol) and, more important, all  patients taking opioid analgesic controlled substances (pain medication). (Example: oxycodone; oxymorphone; hydrocodone; hydromorphone; morphine; methadone; tramadol; tapentadol; fentanyl; buprenorphine; butorphanol; dextromethorphan; meperidine; codeine; etc.)  Introduction:  Recently there has been a drive towards the use of "natural" products for the treatment of different conditions, including pain anxiety and sleep disorders. Marijuana and hemp are two varieties of the cannabis genus plants. Marijuana and its derivatives are illegal, while hemp and its derivatives are not. Cannabidiol (CBD) and tetrahydrocannabinol (THC), are two natural compounds found in plants of the Cannabis genus. They can both be extracted from hemp or marijuana. Both compounds interact with your body's endocannabinoid system in very different ways. CBD is associated with pain relief (analgesia) while THC is associated with the psychoactive effects ("the high") obtained from the use of marijuana products. There are two main types of THC: Delta-9, which comes from the marijuana plant and it is illegal, and Delta-8, which comes from the hemp plant, and it is legal. (Both, Delta-9-THC and Delta-8-THC are psychoactive and give you "the high".)   Legality:  Marijuana and its derivatives: illegal Hemp and its derivatives: Legal (State dependent) UPDATE: (12/19/2021) The Drug Enforcement Agency (DEA) issued a letter stating that "delta" cannabinoids, including Delta-8-THCO and Delta-9-THCO, synthetically derived from hemp do not qualify as hemp and will be viewed as Schedule I drugs. (Schedule I drugs, substances, or chemicals are defined as drugs with no currently accepted medical use and a high potential for abuse. Some examples of Schedule I drugs are: heroin, lysergic acid diethylamide (LSD), marijuana (cannabis), 3,4-methylenedioxymethamphetamine (ecstasy), methaqualone, and peyote.) (https://www.dea.gov)  Legal status of CBD in  [place]:  "Conditionally Legal"  Reference: "FDA Regulation of Cannabis and Cannabis-Derived Products, Including Cannabidiol (CBD)" - https://www.fda.gov/news-events/public-health-focus/fda-regulation-cannabis-and-cannabis-derived-products-including-cannabidiol-cbd  Warning:  CBD is not FDA approved and has not undergo the same manufacturing controls as prescription drugs.  This means that the purity and safety of available CBD may be questionable. Most of the time, despite manufacturer's claims, it is contaminated with THC (delta-9-tetrahydrocannabinol - the chemical in marijuana responsible for the "HIGH").  When this is the case, the THC contaminant will trigger a positive urine drug screen (UDS) test for Marijuana (carboxy-THC).   The FDA recently put out a warning about 5 things that everyone should be aware of regarding Delta-8 THC: Delta-8 THC products have not been evaluated or approved by the FDA for safe use and may be marketed in ways that put the public health at   risk. The FDA has received adverse event reports involving delta-8 THC-containing products. Delta-8 THC has psychoactive and intoxicating effects. Delta-8 THC manufacturing often involve use of potentially harmful chemicals to create the concentrations of delta-8 THC claimed in the marketplace. The final delta-8 THC product may have potentially harmful by-products (contaminants) due to the chemicals used in the process. Manufacturing of delta-8 THC products may occur in uncontrolled or unsanitary settings, which may lead to the presence of unsafe contaminants or other potentially harmful substances. Delta-8 THC products should be kept out of the reach of children and pets.  NOTE: Because a positive UDS for any illicit substance is a violation of our medication agreement, your opioid analgesics (pain medicine) may be permanently discontinued.  MORE ABOUT CBD  General Information: CBD was discovered in 1940 and it is a derivative of  the cannabis sativa genus plants (Marijuana and Hemp). It is one of the 113 identified substances found in Marijuana. It accounts for up to 40% of the plant's extract. As of 2018, preliminary clinical studies on CBD included research for the treatment of anxiety, movement disorders, and pain. CBD is available and consumed in multiple forms, including inhalation of smoke or vapor, as an aerosol spray, and by mouth. It may be supplied as an oil containing CBD, capsules, dried cannabis, or as a liquid solution. CBD is thought not to be as psychoactive as THC (delta-9-tetrahydrocannabinol - the chemical in marijuana responsible for the "HIGH"). Studies suggest that CBD may interact with different biological target receptors in the body, including cannabinoid and other neurotransmitter receptors. As of 2018 the mechanism of action for its biological effects has not been determined.  Side-effects  Adverse reactions: Dry mouth, diarrhea, decreased appetite, fatigue, drowsiness, malaise, weakness, sleep disturbances, and others.  Drug interactions:  CBD may interact with medications such as blood-thinners. CBD causes drowsiness on its own and it will increase drowsiness caused by other medications, including antihistamines (such as Benadryl), benzodiazepines (Xanax, Ativan, Valium), antipsychotics, antidepressants, opioids, alcohol and supplements such as kava, melatonin and St. John's Wort.  Other drug interactions: Brivaracetam (Briviact); Caffeine; Carbamazepine (Tegretol); Citalopram (Celexa); Clobazam (Onfi); Eslicarbazepine (Aptiom); Everolimus (Zostress); Lithium; Methadone (Dolophine); Rufinamide (Banzel); Sedative medications (CNS depressants); Sirolimus (Rapamune); Stiripentol (Diacomit); Tacrolimus (Prograf); Tamoxifen ; Soltamox); Topiramate (Topamax); Valproate; Warfarin (Coumadin); Zonisamide. (Last update:  10/12/2022) ____________________________________________________________________________________________   ____________________________________________________________________________________________  Naloxone Nasal Spray  Why am I receiving this medication? [place] STOP ACT requires that all patients taking high dose opioids or at risk of opioids respiratory depression, be prescribed an opioid reversal agent, such as Naloxone (AKA: Narcan).  What is this medication? NALOXONE (nal OX one) treats opioid overdose, which causes slow or shallow breathing, severe drowsiness, or trouble staying awake. Call emergency services after using this medication. You may need additional treatment. Naloxone works by reversing the effects of opioids. It belongs to a group of medications called opioid blockers.  COMMON BRAND NAME(S): Kloxxado, Narcan  What should I tell my care team before I take this medication? They need to know if you have any of these conditions: Heart disease Substance use disorder An unusual or allergic reaction to naloxone, other medications, foods, dyes, or preservatives Pregnant or trying to get pregnant Breast-feeding  When to use this medication? This medication is to be used for the treatment of respiratory depression (less than 8 breaths per minute) secondary to opioid overdose.   How to use this medication? This medication is for use in the nose. Lay the person on their   back. Support their neck with your hand and allow the head to tilt back before giving the medication. The nasal spray should be given into 1 nostril. After giving the medication, move the person onto their side. Do not remove or test the nasal spray until ready to use. Get emergency medical help right away after giving the first dose of this medication, even if the person wakes up. You should be familiar with how to recognize the signs and symptoms of a narcotic overdose. If more doses are needed, give  the additional dose in the other nostril. Talk to your care team about the use of this medication in children. While this medication may be prescribed for children as young as newborns for selected conditions, precautions do apply.  Naloxone Overdosage: If you think you have taken too much of this medicine contact a poison control center or emergency room at once.  NOTE: This medicine is only for you. Do not share this medicine with others.  What if I miss a dose? This does not apply.  What may interact with this medication? This is only used during an emergency. No interactions are expected during emergency use. This list may not describe all possible interactions. Give your health care provider a list of all the medicines, herbs, non-prescription drugs, or dietary supplements you use. Also tell them if you smoke, drink alcohol, or use illegal drugs. Some items may interact with your medicine.  What should I watch for while using this medication? Keep this medication ready for use in the case of an opioid overdose. Make sure that you have the phone number of your care team and local hospital ready. You may need to have additional doses of this medication. Each nasal spray contains a single dose. Some emergencies may require additional doses. After use, bring the treated person to the nearest hospital or call 911. Make sure the treating care team knows that the person has received a dose of this medication. You will receive additional instructions on what to do during and after use of this medication before an emergency occurs.  What side effects may I notice from receiving this medication? Side effects that you should report to your care team as soon as possible: Allergic reactions--skin rash, itching, hives, swelling of the face, lips, tongue, or throat Side effects that usually do not require medical attention (report these to your care team if they continue or are  bothersome): Constipation Dryness or irritation inside the nose Headache Increase in blood pressure Muscle spasms Stuffy nose Toothache This list may not describe all possible side effects. Call your doctor for medical advice about side effects. You may report side effects to FDA at 1-800-FDA-1088.  Where should I keep my medication? Because this is an emergency medication, you should keep it with you at all times.  Keep out of the reach of children and pets. Store between 20 and 25 degrees C (68 and 77 degrees F). Do not freeze. Throw away any unused medication after the expiration date. Keep in original box until ready to use.  NOTE: This sheet is a summary. It may not cover all possible information. If you have questions about this medicine, talk to your doctor, pharmacist, or health care provider.   2023 Elsevier/Gold Standard (2021-06-27 00:00:00)  ____________________________________________________________________________________________   

## 2022-11-24 ENCOUNTER — Encounter
Admission: RE | Admit: 2022-11-24 | Discharge: 2022-11-24 | Disposition: A | Discharge status: Home / Self Care | Payer: Medicare Other | Source: Ambulatory Visit | Attending: Orthopedic Surgery | Admitting: Orthopedic Surgery

## 2022-11-24 ENCOUNTER — Other Ambulatory Visit: Payer: Self-pay | Admitting: Pain Medicine

## 2022-11-24 VITALS — BP 131/77 | HR 80 | Temp 97.6°F | Resp 14 | Ht 67.0 in | Wt 165.0 lb

## 2022-11-24 DIAGNOSIS — E119 Type 2 diabetes mellitus without complications: Secondary | ICD-10-CM | POA: Insufficient documentation

## 2022-11-24 DIAGNOSIS — Z01810 Encounter for preprocedural cardiovascular examination: Secondary | ICD-10-CM | POA: Diagnosis not present

## 2022-11-24 DIAGNOSIS — D509 Iron deficiency anemia, unspecified: Secondary | ICD-10-CM | POA: Diagnosis not present

## 2022-11-24 DIAGNOSIS — Z01818 Encounter for other preprocedural examination: Secondary | ICD-10-CM | POA: Insufficient documentation

## 2022-11-24 DIAGNOSIS — M1711 Unilateral primary osteoarthritis, right knee: Secondary | ICD-10-CM | POA: Insufficient documentation

## 2022-11-24 DIAGNOSIS — Z01812 Encounter for preprocedural laboratory examination: Secondary | ICD-10-CM

## 2022-11-24 HISTORY — DX: Deficiency of other specified B group vitamins: E53.8

## 2022-11-24 HISTORY — DX: Spinal stenosis, lumbar region without neurogenic claudication: M48.061

## 2022-11-24 HISTORY — DX: Vitamin D deficiency, unspecified: E55.9

## 2022-11-24 HISTORY — DX: Other intervertebral disc degeneration, lumbar region: M51.36

## 2022-11-24 HISTORY — DX: Chronic pain syndrome: G89.4

## 2022-11-24 HISTORY — DX: Other intervertebral disc degeneration, lumbar region without mention of lumbar back pain or lower extremity pain: M51.369

## 2022-11-24 HISTORY — DX: Unspecified osteoarthritis, unspecified site: M19.90

## 2022-11-24 HISTORY — DX: Depression, unspecified: F32.A

## 2022-11-24 LAB — COMPREHENSIVE METABOLIC PANEL
ALT: 11 U/L (ref 0–44)
AST: 16 U/L (ref 15–41)
Albumin: 4.1 g/dL (ref 3.5–5.0)
Alkaline Phosphatase: 48 U/L (ref 38–126)
Anion gap: 4 — ABNORMAL LOW (ref 5–15)
BUN: 14 mg/dL (ref 8–23)
CO2: 25 mmol/L (ref 22–32)
Calcium: 8.7 mg/dL — ABNORMAL LOW (ref 8.9–10.3)
Chloride: 109 mmol/L (ref 98–111)
Creatinine, Ser: 0.66 mg/dL (ref 0.44–1.00)
GFR, Estimated: >60 mL/min (ref >60)
Glucose, Bld: 85 mg/dL (ref 70–99)
Potassium: 3.5 mmol/L (ref 3.5–5.1)
Sodium: 138 mmol/L (ref 135–145)
Total Bilirubin: 0.5 mg/dL (ref 0.3–1.2)
Total Protein: 6.6 g/dL (ref 6.5–8.1)

## 2022-11-24 LAB — URINALYSIS, ROUTINE W REFLEX MICROSCOPIC
Bacteria, UA: NONE SEEN
Bilirubin Urine: NEGATIVE
Glucose, UA: NEGATIVE mg/dL
Hgb urine dipstick: NEGATIVE
Ketones, ur: NEGATIVE mg/dL
Nitrite: NEGATIVE
Protein, ur: NEGATIVE mg/dL
Specific Gravity, Urine: 1.025 (ref 1.005–1.030)
pH: 5 (ref 5.0–8.0)

## 2022-11-24 LAB — TYPE AND SCREEN
ABO/RH(D): B POS
Antibody Screen: NEGATIVE

## 2022-11-24 LAB — CBC
HCT: 37.3 % (ref 36.0–46.0)
Hemoglobin: 12.5 g/dL (ref 12.0–15.0)
MCH: 30.9 pg (ref 26.0–34.0)
MCHC: 33.5 g/dL (ref 30.0–36.0)
MCV: 92.1 fL (ref 80.0–100.0)
Platelets: 236 10*3/uL (ref 150–400)
RBC: 4.05 MIL/uL (ref 3.87–5.11)
RDW: 12.7 % (ref 11.5–15.5)
WBC: 6.5 10*3/uL (ref 4.0–10.5)
nRBC: 0 % (ref 0.0–0.2)

## 2022-11-24 LAB — C-REACTIVE PROTEIN: CRP: <0.5 mg/dL (ref <1.0)

## 2022-11-24 LAB — SURGICAL PCR SCREEN
MRSA, PCR: NEGATIVE
Staphylococcus aureus: NEGATIVE

## 2022-11-24 LAB — HEMOGLOBIN A1C
Hgb A1c MFr Bld: 5.4 % (ref 4.8–5.6)
Mean Plasma Glucose: 108.28 mg/dL

## 2022-11-24 LAB — SEDIMENTATION RATE: Sed Rate: 7 mm/hr (ref 0–22)

## 2022-11-24 NOTE — Patient Instructions (Addendum)
Your procedure is scheduled on:  Monday, February 5 Report to the Registration Desk on the 1st floor of the [facility]. To find out your arrival time, please call [phone] between 1PM - 3PM on: Friday, February 2 If your arrival time is 6:00 am, do not arrive prior to that time as the [place] entrance doors do not open until 6:00 am.  REMEMBER: Instructions that are not followed completely may result in serious medical risk, up to and including death; or upon the discretion of your surgeon and anesthesiologist your surgery may need to be rescheduled.  Do not eat food after midnight the night before surgery.  No gum chewing, lozengers or hard candies.  You may however, drink water up to 2 hours before you are scheduled to arrive for your surgery. Do not drink anything within 2 hours of your scheduled arrival time.  In addition, your doctor has ordered for you to drink the provided  Gatorade G2 Drinking this carbohydrate drink up to two hours before surgery helps to reduce insulin resistance and improve patient outcomes. Please complete drinking 2 hours prior to scheduled arrival time.  TAKE THESE MEDICATIONS THE MORNING OF SURGERY WITH A SIP OF WATER:  Escitalopram (Lexapro)  Trulicity - stop 7 days before surgery. Normally take on Thursday's. Last day to take is Thursday, January 25. Resume on your regular day, Thursday, February 8.  One week prior to surgery: starting January 29 Stop meloxicam and Anti-inflammatories (NSAIDS) such as Advil, Aleve, Ibuprofen, Motrin, Naproxen, Naprosyn and Aspirin based products such as Excedrin, Goodys Powder, BC Powder. Stop ANY OVER THE COUNTER supplements until after surgery. Stop melatonin. You may however, continue to take Tylenol if needed for pain up until the day of surgery.  No Alcohol for 24 hours before or after surgery.  No Smoking including e-cigarettes for 24 hours prior to surgery.  No chewable tobacco products for at least  6 hours prior to surgery.  No nicotine patches on the day of surgery.  Do not use any "recreational" drugs for at least a week prior to your surgery.  Please be advised that the combination of cocaine and anesthesia may have negative outcomes, up to and including death. If you test positive for cocaine, your surgery will be cancelled.  On the morning of surgery brush your teeth with toothpaste and water, you may rinse your mouth with mouthwash if you wish. Do not swallow any toothpaste or mouthwash.  Use CHG Soap as directed on instruction sheet.  Do not wear jewelry, make-up, hairpins, clips or nail polish.  Do not wear lotions, powders, or perfumes.   Do not shave body from the neck down 48 hours prior to surgery just in case you cut yourself which could leave a site for infection.  Also, freshly shaved skin may become irritated if using the CHG soap.  Do not bring valuables to the hospital. [facility] is not responsible for any missing/lost belongings or valuables.   Notify your doctor if there is any change in your medical condition (cold, fever, infection).  Wear comfortable clothing (specific to your surgery type) to the hospital.  After surgery, you can help prevent lung complications by doing breathing exercises.  Take deep breaths and cough every 1-2 hours. Your doctor may order a device called an Incentive Spirometer to help you take deep breaths.  If you are being admitted to the hospital overnight, leave your suitcase in the car. After surgery it may be brought to  your room.  If you are being discharged the day of surgery, you will not be allowed to drive home. You will need a responsible adult (18 years or older) to drive you home and stay with you that night.   If you are taking public transportation, you will need to have a responsible adult (18 years or older) with you. Please confirm with your physician that it is acceptable to use public transportation.    Please call the [facility] Dept. at [phone] if you have any questions about these instructions.  Surgery Visitation Policy:  Patients undergoing a surgery or procedure may have two family members or support persons with them as long as the person is not COVID-19 positive or experiencing its symptoms.   Inpatient Visitation:    Visiting hours are 7 a.m. to 8 p.m. Up to four visitors are allowed at one time in a patient room. The visitors may rotate out with other people during the day. One designated support person (adult) may remain overnight.  Due to an increase in RSV and influenza rates and associated hospitalizations, children ages 64 and under will not be able to visit patients in [facility]. Masks continue to be strongly recommended.     Preparing for Surgery with CHLORHEXIDINE GLUCONATE (CHG) Soap  Chlorhexidine Gluconate (CHG) Soap  o An antiseptic cleaner that kills germs and bonds with the skin to continue killing germs even after washing  o Used for showering the night before surgery and morning of surgery  Before surgery, you can play an important role by reducing the number of germs on your skin.  CHG (Chlorhexidine gluconate) soap is an antiseptic cleanser which kills germs and bonds with the skin to continue killing germs even after washing.  Please do not use if you have an allergy to CHG or antibacterial soaps. If your skin becomes reddened/irritated stop using the CHG.  1. Shower the NIGHT BEFORE SURGERY and the MORNING OF SURGERY with CHG soap.  2. If you choose to wash your hair, wash your hair first as usual with your normal shampoo.  3. After shampooing, rinse your hair and body thoroughly to remove the shampoo.  4. Use CHG as you would any other liquid soap. You can apply CHG directly to the skin and wash gently with a scrungie or a clean washcloth.  5. Apply the CHG soap to your body only from the neck down. Do not use on  open wounds or open sores. Avoid contact with your eyes, ears, mouth, and genitals (private parts). Wash face and genitals (private parts) with your normal soap.  6. Wash thoroughly, paying special attention to the area where your surgery will be performed.  7. Thoroughly rinse your body with warm water.  8. Do not shower/wash with your normal soap after using and rinsing off the CHG soap.  9. Pat yourself dry with a clean towel.  10. Wear clean pajamas to bed the night before surgery.  12. Place clean sheets on your bed the night of your first shower and do not sleep with pets.  13. Shower again with the CHG soap on the day of surgery prior to arriving at the hospital.  14. Do not apply any deodorants/lotions/powders.  15. Please wear clean clothes to the hospital.

## 2022-11-24 NOTE — Discharge Instructions (Signed)
Instructions after Total Knee Replacement   Naeema Patlan P. Leshaun Biebel, Jr., M.D.     Dept. of Orthopaedics & Sports Medicine  [facility]  [address]  [place], [place]  [address]  Phone: [phone]   Fax: [phone]    DIET: Drink plenty of non-alcoholic fluids. Resume your normal diet. Include foods high in fiber.  ACTIVITY:  You may use crutches or a walker with weight-bearing as tolerated, unless instructed otherwise. You may be weaned off of the walker or crutches by your Physical Therapist.  Do NOT place pillows under the knee. Anything placed under the knee could limit your ability to straighten the knee.   Continue doing gentle exercises. Exercising will reduce the pain and swelling, increase motion, and prevent muscle weakness.   Please continue to use the TED compression stockings for 6 weeks. You may remove the stockings at night, but should reapply them in the morning. Do not drive or operate any equipment until instructed.  WOUND CARE:  Continue to use the PolarCare or ice packs periodically to reduce pain and swelling. You may bathe or shower after the staples are removed at the first office visit following surgery.  MEDICATIONS: You may resume your regular medications. Please take the pain medication as prescribed on the medication. Do not take pain medication on an empty stomach. You have been given a prescription for a blood thinner (Lovenox or Coumadin). Please take the medication as instructed. (NOTE: After completing a 2 week course of Lovenox, take one Enteric-coated aspirin once a day. This along with elevation will help reduce the possibility of phlebitis in your operated leg.) Do not drive or drink alcoholic beverages when taking pain medications.  CALL THE OFFICE FOR: Temperature above 101 degrees Excessive bleeding or drainage on the dressing. Excessive swelling, coldness, or paleness of the toes. Persistent nausea and vomiting.  FOLLOW-UP:  You  should have an appointment to return to the office in 10-14 days after surgery. Arrangements have been made for continuation of Physical Therapy (either home therapy or outpatient therapy).   [facility] Department Directory         [email or web address]       [email or web address]          Cardiology  Appointments: [place] - [phone] [place] - [phone]  Endocrinology  Appointments: [place] - [phone] [place] - [phone]  Gastroenterology  Appointments: [place] - [phone] [place] - [phone]        General Surgery   Appointments: [place] - [phone]  Internal Medicine/Family Medicine  Appointments: [place] - [phone] [place] - [phone] [place] - [phone]  Metabolic and Weigh Loss Surgery  Appointments: [place] - [phone]        Neurology  Appointments: [place] - [phone] [place] - [phone]  Neurosurgery  Appointments: [place] - [phone]  Obstetrics & Gynecology  Appointments: [place] - [phone] [place] - [phone]        Pediatrics  Appointments: [place] - [phone] [place] - [phone]  Physiatry  Appointments: [place] -[phone]  Physical Therapy  Appointments: [place] - [phone] [place] - [phone]        Podiatry  Appointments: [place] - [phone] [place] - [phone]  Pulmonology  Appointments: [place] - [phone]  Rheumatology  Appointments: [place] - [phone]        [place] Location: [facility]  [address] , [place]  [address]  [place] Location: [facility] [address] [place], [place]  [address]  [place] Location: [facility] [address] [place], [place]  [address]    

## 2022-11-25 LAB — URINE CULTURE: Culture: NO GROWTH

## 2022-11-26 NOTE — Progress Notes (Signed)
  Perioperative Services Pre-Admission/Anesthesia Testing    Date: 11/26/22  Name: Stephanie [name] MRN:   [id number]  Re: GLP-1 clearance and provider recommendations   Planned Surgical Procedure(s):    Case: [id number] Date/Time: 12/07/22 1059   Procedure: COMPUTER ASSISTED TOTAL KNEE ARTHROPLASTY - RNFA (Right: Knee) - 2ND CASE   Anesthesia type: Choice   Pre-op diagnosis: PRIMARY OSTEOARTHRITIS OF RIGHT KNEE.   Location: [facility] OR ROOM 01 / [employer] [facility]   Surgeons: Dereck Leep, MD   Clinical Notes:  Patient is scheduled for the above procedure with the indicated provider/surgeon. In review of her medication reconciliation it was noted that patient is on a prescribed GLP-1 medication. Per guidelines issued by the American Society of Anesthesiologists (ASA), it is recommended that these medications be held for 7 days prior to the patient undergoing any type of elective surgical procedure. The patient is taking the following GLP-1 medication:  '[]'$  SEMAGLUTIDE   '[]'$  EXENATIDE  '[]'$  LIRAGLUTIDE   '[]'$  LIXISENATIDE  '[x]'$  DULAGLUTIDE     '[]'$  OTHER GLP-1 medication: _______________  Reached out to prescribing provider Michaelle Copas, NP-C) to make them aware of the guidelines from anesthesia. Given that this patient takes the prescribed GLP-1 medication for her  diabetes diagnosis, rather than for weight loss, recommendations from the prescribing provider were solicited. Prescribing provider made aware of the following so that informed decision/POC can be developed for this patient that may be taking medications belonging to these drug classes:  Oral GLP-1 medications will be held 1 day prior to surgery.  Injectable GLP-1 medications will be held 7 days prior to surgery.  Metformin is routinely held 48 hours prior to surgery due to renal concerns, potential need for contrasted imaging perioperatively, and the potential for tissue hypoxia leading to drug induced lactic  acidosis.  All SGLT2i medications are held 72 hours prior to surgery as they can be associated with the increased potential for developing euglycemic diabetic ketoacidosis (EDKA).   Impression and Plan:  Stephanie [name] is on a prescribed GLP-1 medication, which induces the known side effect of decreased gastric emptying. Efforts are bring made to mitigate the risk of perioperative hyperglycemic events, as elevated blood glucose levels have been found to contribute to intra/postoperative complications. Additionally, hyperglycemic extremes can potentially necessitate the postponing of a patient's elective case in order to better optimize perioperative glycemic control, again with the aforementioned guidelines in place. With this in mind, recommendations have been sought from the prescribing provider, who has cleared patient to proceed with holding the prescribed GLP-1 as per the guidelines from the ASA.   Provider recommending: no further recommendations received from the prescribing provider.  Copy of signed clearance and recommendations placed on patient's chart for inclusion in their medical record and for review by the surgical/anesthetic team on the day of her procedure.   Honor Loh, MSN, APRN, FNP-C, CEN [facility]  Peri-operative Services Nurse Practitioner Phone: [phone] 11/26/22 4:58 PM  NOTE: This note has been prepared using Dragon dictation software. Despite my best ability to proofread, there is always the potential that unintentional transcriptional errors may still occur from this process.

## 2022-12-03 IMAGING — CR DG CHEST 2V
1 series · 2 of 2 positions shown · non-contrast
Comparison: Chest x-ray 04/09/2014.

CLINICAL DATA: Chest pain.

EXAM:
CHEST - 2 VIEW

[Series 1: dg chest 2 view · 0.14mm/px · 2 of 2 slices shown]
[im 1/2]
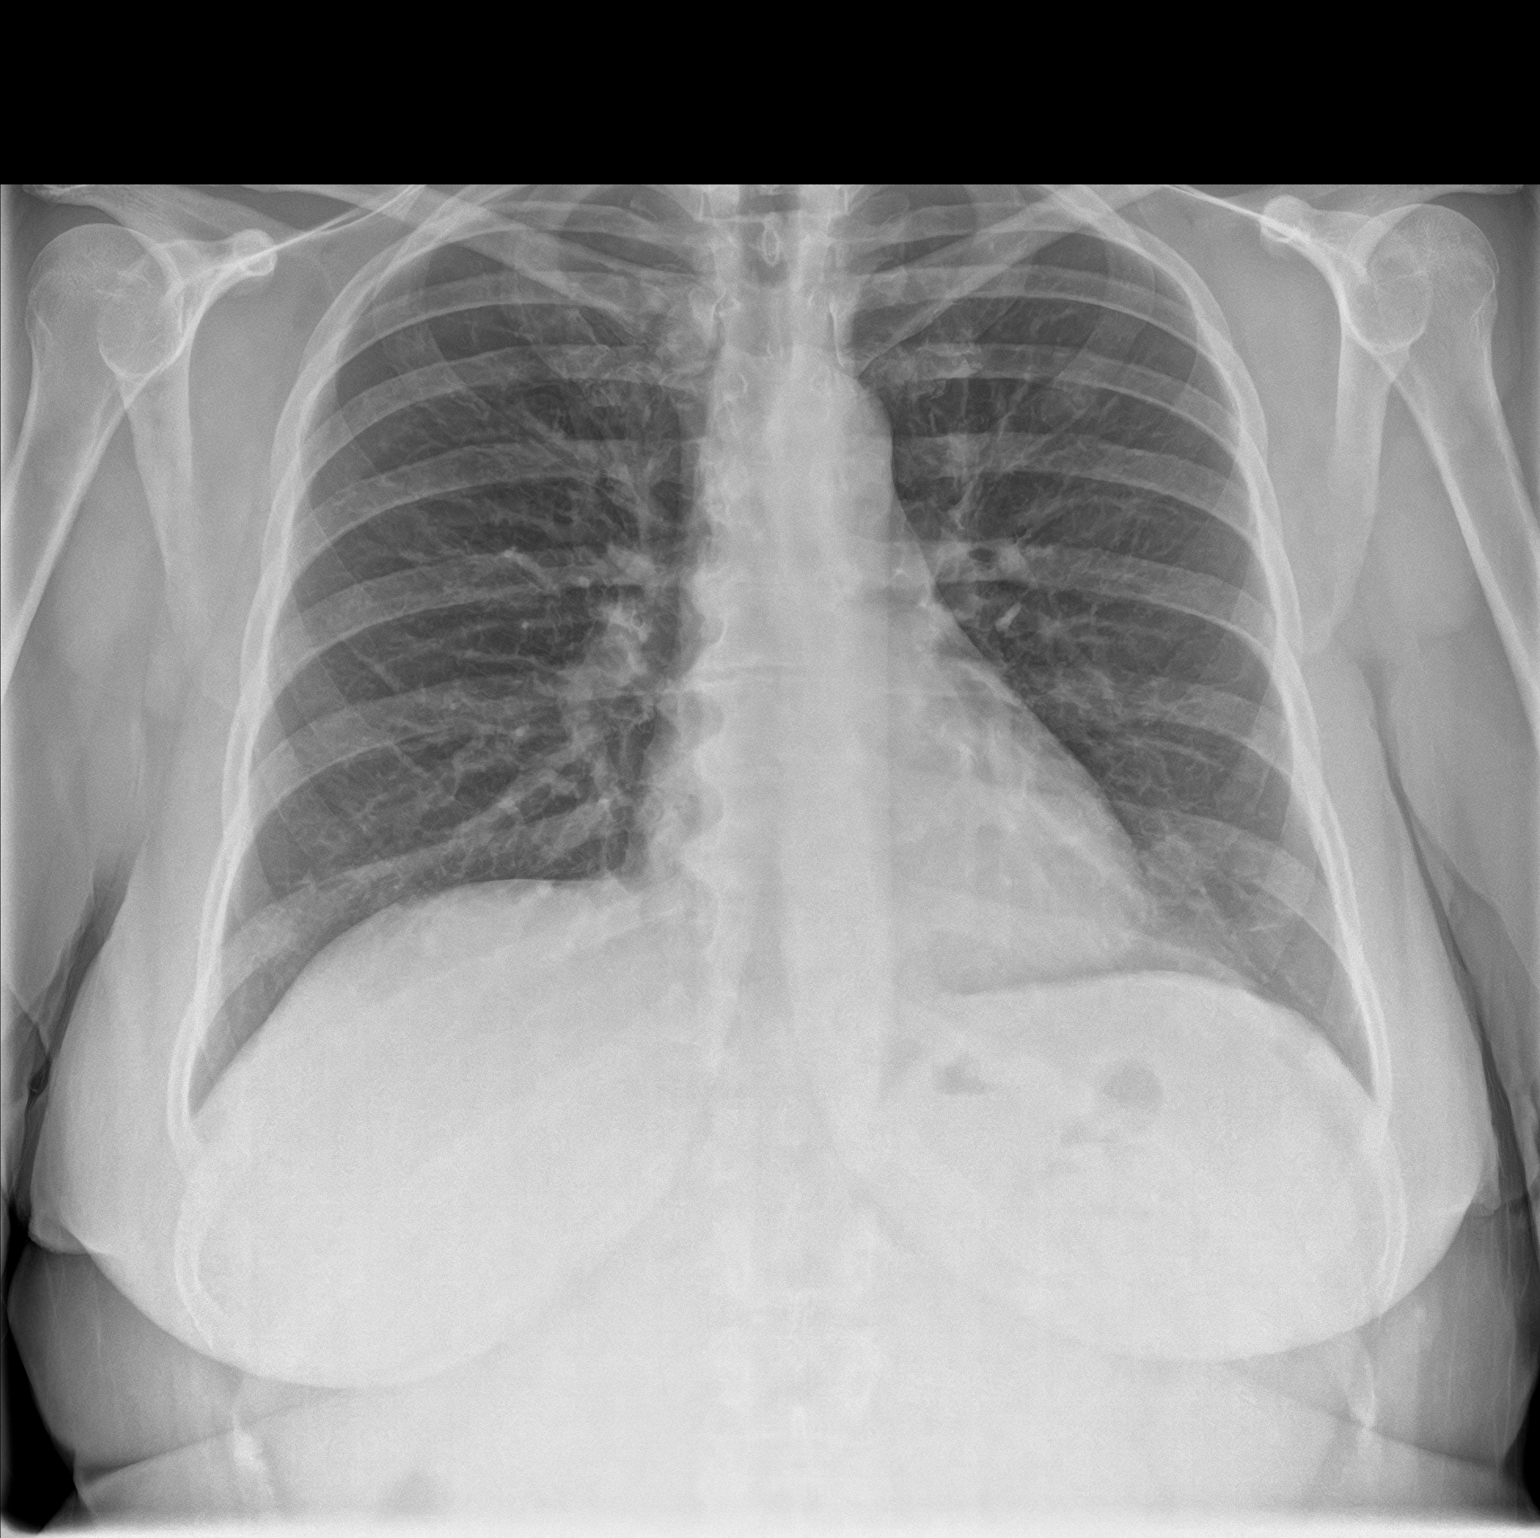
[im 2/2]
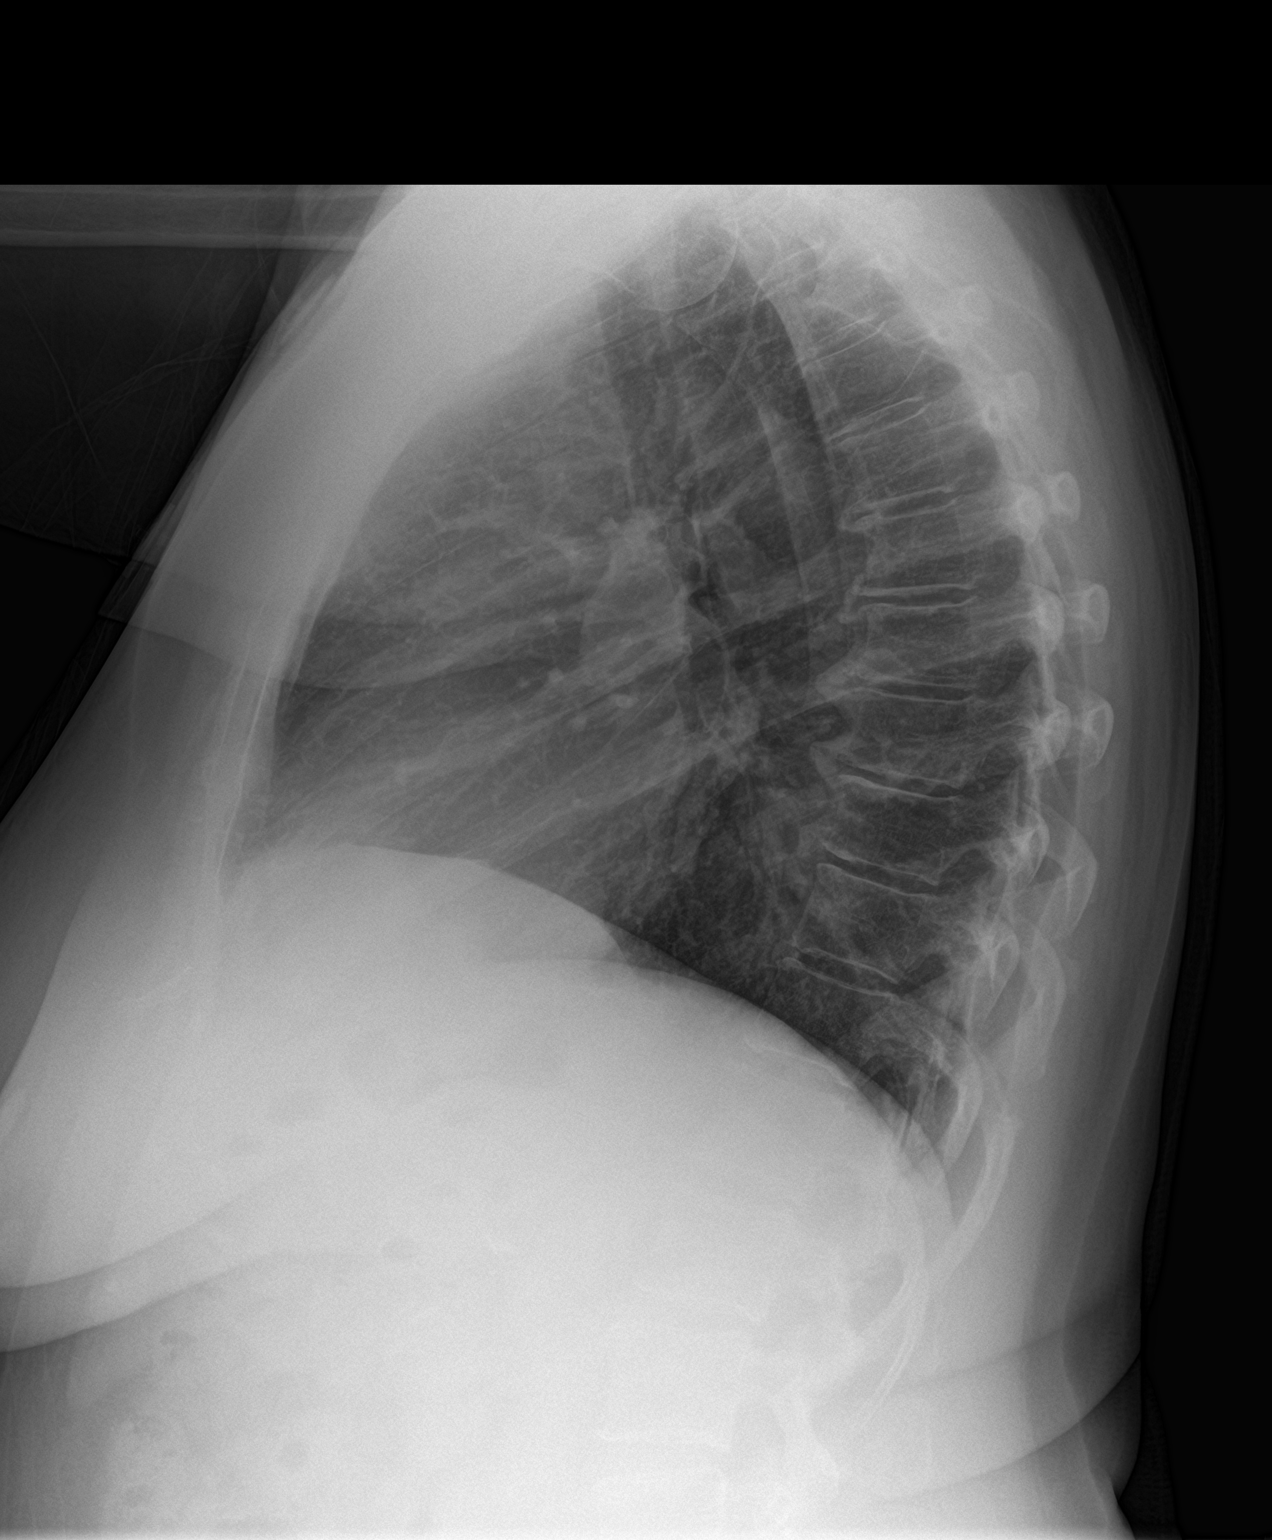

[2 of 2 positions shown; findings below may reference images not displayed]

FINDINGS: Mediastinum and hilar structures normal. Heart size normal. Mild
left base atelectasis and or scarring again noted. No interim
change. No acute infiltrate. No pleural effusion or pneumothorax. No
acute bony abnormality. Degenerative change thoracic spine.
IMPRESSION: Mild left base atelectasis and or scarring again noted. No interim
change. No acute cardiopulmonary disease identified.

## 2022-12-06 ENCOUNTER — Encounter: Payer: Self-pay | Admitting: Orthopedic Surgery

## 2022-12-06 NOTE — H&P (Signed)
ORTHOPAEDIC HISTORY & PHYSICAL Regino Bellow, PA - 11/30/2022 11:00 AM EST Formatting of this note is different from the original. Images from the original note were not included. Chief Complaint Chief Complaint Patient presents with Knee Pain H & P RIGHT KNEE  Reason for Visit Stephanie [name] is a 62 y.o. who presents today for a history and physical. She is to undergo a right total knee arthroplasty on 12/07/2022. Since her last visit in the clinic she has had no change in condition and wishes to proceed with surgery. She has obtained a letter from Dr. Lowella Dandy in regards to her pain medicine postop.  She has a 2-3 year history of bilateral knee pain. She localizes most of the pain along the medial aspect of the knees. She reports some swelling, some locking, and some giving way of the knees. She has appreciated progressive decrease in her knee range of motion, especially with regards to the right knee. The pain is aggravated by any weight bearing, going up and down stairs and rising after sitting. The knee pain limits her ability to ambulate long distances. She has difficulty getting in and out of a car and arising from a low seat. The patient has not appreciated any significant improvement despite NSAIDs, narcotic analgesia, intra-articular corticosteroid injections, viscosupplementation, activity modification, and ambulatory aids. She is using a cane for ambulation. The patient states that the knee pain has progressed to the point that it is significantly interfering with her activities of daily living.  Patient is glucose is now 24 with a A1c of 5.4  Past Medical History Past Medical History: Diagnosis Date Back pain Diabetes mellitus (CMS-HCC) Eczema, unspecified Long term (current) use of opiate analgesic 08/22/2015 Pain in both upper arms 10/04/2019 Sleep disturbance 08/23/2015  Past Surgical History Past Surgical History: Procedure Laterality Date Right knee arthroscopy  2014 [place] APPENDECTOMY BUNION CORRECTION CESAREAN SECTION CHOLECYSTECTOMY LAMINECTOMY LUMBAR SPINE  Past Family History Family History Problem Relation Age of Onset Diabetes type II Mother Stroke Mother Diabetes type II Sister Diabetes type II Brother  Medications Current Outpatient Medications Ordered in Epic Medication Sig Dispense Refill alcohol swabs PadM Apply 1 each topically once daily And as needed Before using lancet to prick finger 200 each 1 blood glucose meter kit as directed 1 each 0 cholecalciferol (VITAMIN D3) 5,000 unit capsule Take 1 capsule (5,000 Units total) by mouth once daily cyanocobalamin (VITAMIN B12) 1000 MCG tablet Take 1 tablet (1,000 mcg total) by mouth once daily . 360 tablet 11 cyclobenzaprine (FLEXERIL) 10 MG tablet Reported on 12/18/2015 0 dulaglutide (TRULICITY) 3 QJ/1.9 mL subcutaneous pen injector Inject 0.5 mLs (3 mg total) subcutaneously every 7 (seven) days 2 mL 0 escitalopram oxalate (LEXAPRO) 20 MG tablet TAKE 1 TABLET BY MOUTH EVERY DAY 30 tablet 1 gabapentin (NEURONTIN) 800 MG tablet Take 0.5 tablets (400 mg total) by mouth 2 (two) times daily AND 1 tablet (800 mg total) nightly. 180 tablet 3 HYDROcodone-acetaminophen (NORCO) 10-325 mg tablet 0 ibuprofen (MOTRIN) 200 MG tablet Take 800 mg by mouth once daily lancets Use 1 each once daily And as needed 150 each 12 metFORMIN (GLUCOPHAGE-XR) 500 MG XR tablet Take 1 tablet (500 mg total) by mouth daily with breakfast AND 2 tablets (1,000 mg total) daily with dinner. 270 tablet 1 ONETOUCH VERIO TEST STRIPS test strip 1 EACH (1 STRIP TOTAL) ONCE DAILY AND AS NEEDED 100 strip 25 triamcinolone 0.1 % ointment APPLY TOPICALLY TWICE A DAY (Patient taking differently: Apply topically 2 (two) times daily  as needed) 80 g 1  No current Epic-ordered facility-administered medications on file.  Allergies Allergies Allergen Reactions Promethazine Anxiety Restlessness, "makes me not feel  right"   Review of Systems A comprehensive 14 point ROS was performed, reviewed, and the pertinent orthopaedic findings are documented in the HPI.  Exam BP 120/80 (BP Location: Left upper arm, Patient Position: Sitting, BP Cuff Size: Adult)  Ht 170.2 cm ('5\' 7"'$ )  Wt 73.8 kg (162 lb 9.6 oz)  LMP 07/14/2015 (Exact Date)  BMI 25.47 kg/m  General: Well-developed well-nourished female seen in no acute distress.  HEENT: Atraumatic,normocephalic. Pupils are equal and reactive to light. Oropharynx is clear with moist mucosa  Lungs: Clear to auscultation bilaterally  Cardiovascular: Regular rate and rhythm. Normal S1, S2. No murmurs. No appreciable gallops or rubs. Peripheral pulses are palpable.  Abdomen: Soft, non-tender, nondistended. Bowel sounds present  Extremity: Right Knee: Soft tissue swelling: mild Effusion: minimal Erythema: none Crepitance: mild Tenderness: medial Alignment: relative varus Mediolateral laxity: medial pseudolaxity Posterior sag: negative Patellar tracking: Good tracking without evidence of subluxation or tilt Atrophy: No significant atrophy. Quadriceps tone was fair to good. Range of motion: 0/12/82 degrees  Neurological:  The patient is alert and oriented Sensation to light touch appears to be intact and within normal limits Gross motor strength appeared to be equal to 5/5  Vascular :  Peripheral pulses felt to be palpable. Capillary refill appears to be intact and within normal limits  X-ray  1. AP standing, lateral and sunrise view of the right knee ordered and interpreted on today's visit shows significant narrowing of the medial cartilage space with associated varus alignment bilaterally. Osteophyte formation as well as subchondral porosis is noted. Patella tracking well. She is noted to have arthritis in a tricompartmental fashion.  Impression  1. Degenerative arthrosis right knee  Plan  1. I did go the patient's medication on  today's visit. She currently is not taking Trulicity or metformin 2. Return to clinic 2 weeks postop. Sooner if any problems 3. Did discuss postop rehab 4. Past medical history was reviewed  This note was generated in part with voice recognition software and I apologize for any typographical errors that were not detected and corrected   Watt Climes PA Electronically signed by Regino Bellow, PA at 11/30/2022 12:09 PM EST

## 2022-12-07 ENCOUNTER — Observation Stay
Admission: RE | Admit: 2022-12-07 | Discharge: 2022-12-08 | Disposition: A | Discharge status: Home Health | Payer: Medicare Other | Attending: Orthopedic Surgery | Admitting: Orthopedic Surgery

## 2022-12-07 ENCOUNTER — Observation Stay: Payer: Medicare Other

## 2022-12-07 ENCOUNTER — Encounter: Payer: Self-pay | Admitting: Orthopedic Surgery

## 2022-12-07 ENCOUNTER — Other Ambulatory Visit: Payer: Self-pay

## 2022-12-07 ENCOUNTER — Ambulatory Visit: Payer: Medicare Other | Admitting: Urgent Care

## 2022-12-07 ENCOUNTER — Encounter
Admission: RE | Disposition: A | Discharge status: Home Health | Payer: Self-pay | Source: Home / Self Care | Attending: Orthopedic Surgery

## 2022-12-07 DIAGNOSIS — Z96659 Presence of unspecified artificial knee joint: Secondary | ICD-10-CM

## 2022-12-07 DIAGNOSIS — E119 Type 2 diabetes mellitus without complications: Secondary | ICD-10-CM | POA: Insufficient documentation

## 2022-12-07 DIAGNOSIS — Z7984 Long term (current) use of oral hypoglycemic drugs: Secondary | ICD-10-CM | POA: Insufficient documentation

## 2022-12-07 DIAGNOSIS — M1711 Unilateral primary osteoarthritis, right knee: Secondary | ICD-10-CM | POA: Diagnosis present

## 2022-12-07 DIAGNOSIS — Z79899 Other long term (current) drug therapy: Secondary | ICD-10-CM | POA: Insufficient documentation

## 2022-12-07 DIAGNOSIS — D509 Iron deficiency anemia, unspecified: Secondary | ICD-10-CM

## 2022-12-07 DIAGNOSIS — Z96651 Presence of right artificial knee joint: Secondary | ICD-10-CM

## 2022-12-07 DIAGNOSIS — R8290 Unspecified abnormal findings in urine: Secondary | ICD-10-CM

## 2022-12-07 DIAGNOSIS — Z7985 Long-term (current) use of injectable non-insulin antidiabetic drugs: Secondary | ICD-10-CM | POA: Insufficient documentation

## 2022-12-07 DIAGNOSIS — Z01812 Encounter for preprocedural laboratory examination: Secondary | ICD-10-CM

## 2022-12-07 HISTORY — PX: KNEE ARTHROPLASTY: SHX992

## 2022-12-07 LAB — GLUCOSE, CAPILLARY
Glucose-Capillary: 115 mg/dL — ABNORMAL HIGH (ref 70–99)
Glucose-Capillary: 168 mg/dL — ABNORMAL HIGH (ref 70–99)
Glucose-Capillary: 151 mg/dL — ABNORMAL HIGH (ref 70–99)

## 2022-12-07 LAB — ABO/RH: ABO/RH(D): B POS

## 2022-12-07 SURGERY — ARTHROPLASTY, KNEE, TOTAL, USING IMAGELESS COMPUTER-ASSISTED NAVIGATION
Anesthesia: Spinal | Site: Knee | Laterality: Right

## 2022-12-07 MED ORDER — MIDAZOLAM HCL 2 MG/2ML IJ SOLN
INTRAMUSCULAR | Status: AC
  Filled 2022-12-07: qty 2

## 2022-12-07 MED ORDER — CELECOXIB 200 MG PO CAPS: 400.0000 mg | ORAL_CAPSULE | Freq: Once | ORAL | Status: AC

## 2022-12-07 MED ORDER — PROPOFOL 10 MG/ML IV BOLUS
INTRAVENOUS | Status: AC
  Filled 2022-12-07: qty 20

## 2022-12-07 MED ORDER — BISACODYL 10 MG RE SUPP: 10.0000 mg | Freq: Every day | RECTAL | Status: DC | PRN

## 2022-12-07 MED ORDER — ACETAMINOPHEN 10 MG/ML IV SOLN
INTRAVENOUS | Status: AC
  Filled 2022-12-07: qty 100

## 2022-12-07 MED ORDER — FENTANYL CITRATE (PF) 100 MCG/2ML IJ SOLN
INTRAMUSCULAR | Status: AC
  Administered 2022-12-07: 25 ug via INTRAVENOUS
  Filled 2022-12-07: qty 2

## 2022-12-07 MED ORDER — PROPOFOL 1000 MG/100ML IV EMUL
INTRAVENOUS | Status: AC
  Filled 2022-12-07: qty 100

## 2022-12-07 MED ORDER — GLYCOPYRROLATE 0.2 MG/ML IJ SOLN
INTRAMUSCULAR | Status: AC
  Filled 2022-12-07: qty 1

## 2022-12-07 MED ORDER — MAGNESIUM HYDROXIDE 400 MG/5ML PO SUSP
30.0000 mL | Freq: Every day | ORAL | Status: DC
  Administered 2022-12-08: 30 mL via ORAL

## 2022-12-07 MED ORDER — PHENYLEPHRINE HCL-NACL 20-0.9 MG/250ML-% IV SOLN: INTRAVENOUS | Status: DC | PRN

## 2022-12-07 MED ORDER — GABAPENTIN 300 MG PO CAPS: 300.0000 mg | ORAL_CAPSULE | Freq: Once | ORAL | Status: AC

## 2022-12-07 MED ORDER — ACETAMINOPHEN 10 MG/ML IV SOLN
INTRAVENOUS | Status: DC | PRN
  Administered 2022-12-07: 1000 mg via INTRAVENOUS

## 2022-12-07 MED ORDER — TRANEXAMIC ACID-NACL 1000-0.7 MG/100ML-% IV SOLN
INTRAVENOUS | Status: AC
  Filled 2022-12-07: qty 100

## 2022-12-07 MED ORDER — CELECOXIB 200 MG PO CAPS
ORAL_CAPSULE | ORAL | Status: AC
  Administered 2022-12-07: 400 mg via ORAL
  Filled 2022-12-07: qty 2

## 2022-12-07 MED ORDER — CEFAZOLIN SODIUM-DEXTROSE 2-4 GM/100ML-% IV SOLN
INTRAVENOUS | Status: AC
  Filled 2022-12-07: qty 100

## 2022-12-07 MED ORDER — SODIUM CHLORIDE 0.9 % IV SOLN: INTRAVENOUS | Status: DC

## 2022-12-07 MED ORDER — CEFAZOLIN SODIUM-DEXTROSE 2-4 GM/100ML-% IV SOLN
2.0000 g | INTRAVENOUS | Status: AC
  Administered 2022-12-07: 2 g via INTRAVENOUS

## 2022-12-07 MED ORDER — NALOXONE HCL 4 MG/0.1ML NA LIQD: 1.0000 | NASAL | Status: DC | PRN

## 2022-12-07 MED ORDER — HYDROMORPHONE HCL 1 MG/ML IJ SOLN
INTRAMUSCULAR | Status: AC
  Administered 2022-12-07: 0.5 mg
  Filled 2022-12-07: qty 0.5

## 2022-12-07 MED ORDER — METOCLOPRAMIDE HCL 10 MG PO TABS
ORAL_TABLET | ORAL | Status: AC
  Filled 2022-12-07: qty 1

## 2022-12-07 MED ORDER — GABAPENTIN 400 MG PO CAPS
800.0000 mg | ORAL_CAPSULE | Freq: Three times a day (TID) | ORAL | Status: DC
  Administered 2022-12-07 – 2022-12-08 (×2): 800 mg via ORAL
  Filled 2022-12-07 (×3): qty 2

## 2022-12-07 MED ORDER — CHLORHEXIDINE GLUCONATE 4 % EX LIQD
60.0000 mL | Freq: Once | CUTANEOUS | Status: AC
  Administered 2022-12-07: 4 via TOPICAL

## 2022-12-07 MED ORDER — OXYCODONE HCL 5 MG PO TABS
5.0000 mg | ORAL_TABLET | Freq: Once | ORAL | Status: AC | PRN
  Administered 2022-12-07: 5 mg via ORAL

## 2022-12-07 MED ORDER — TRANEXAMIC ACID-NACL 1000-0.7 MG/100ML-% IV SOLN
1000.0000 mg | INTRAVENOUS | Status: AC
  Administered 2022-12-07: 1000 mg via INTRAVENOUS

## 2022-12-07 MED ORDER — PHENOL 1.4 % MT LIQD: 1.0000 | OROMUCOSAL | Status: DC | PRN

## 2022-12-07 MED ORDER — 0.9 % SODIUM CHLORIDE (POUR BTL) OPTIME
TOPICAL | Status: DC | PRN
  Administered 2022-12-07: 500 mL

## 2022-12-07 MED ORDER — TRANEXAMIC ACID-NACL 1000-0.7 MG/100ML-% IV SOLN
1000.0000 mg | Freq: Once | INTRAVENOUS | Status: AC
  Administered 2022-12-07: 1000 mg via INTRAVENOUS

## 2022-12-07 MED ORDER — PROPOFOL 10 MG/ML IV BOLUS
INTRAVENOUS | Status: DC | PRN
  Administered 2022-12-07: 30 mg via INTRAVENOUS
  Administered 2022-12-07 (×3): 20 mg via INTRAVENOUS

## 2022-12-07 MED ORDER — PHENYLEPHRINE HCL-NACL 20-0.9 MG/250ML-% IV SOLN
INTRAVENOUS | Status: AC
  Filled 2022-12-07: qty 250

## 2022-12-07 MED ORDER — ESMOLOL HCL 100 MG/10ML IV SOLN
INTRAVENOUS | Status: DC | PRN
  Administered 2022-12-07: 20 mg via INTRAVENOUS
  Administered 2022-12-07: 10 mg via INTRAVENOUS

## 2022-12-07 MED ORDER — ENOXAPARIN SODIUM 30 MG/0.3ML IJ SOSY
30.0000 mg | PREFILLED_SYRINGE | Freq: Two times a day (BID) | INTRAMUSCULAR | Status: DC
  Administered 2022-12-08: 30 mg via SUBCUTANEOUS

## 2022-12-07 MED ORDER — SODIUM CHLORIDE 0.9 % IR SOLN
Status: DC | PRN
  Administered 2022-12-07: 3000 mL

## 2022-12-07 MED ORDER — DEXAMETHASONE SODIUM PHOSPHATE 10 MG/ML IJ SOLN
INTRAMUSCULAR | Status: AC
  Administered 2022-12-07: 8 mg via INTRAVENOUS
  Filled 2022-12-07: qty 1

## 2022-12-07 MED ORDER — CYCLOBENZAPRINE HCL 10 MG PO TABS
10.0000 mg | ORAL_TABLET | Freq: Every day | ORAL | Status: DC | PRN
  Administered 2022-12-07: 10 mg via ORAL
  Filled 2022-12-07 (×2): qty 1

## 2022-12-07 MED ORDER — OXYCODONE HCL 5 MG/5ML PO SOLN: 5.0000 mg | Freq: Once | ORAL | Status: AC | PRN

## 2022-12-07 MED ORDER — TRAMADOL HCL 50 MG PO TABS
ORAL_TABLET | ORAL | Status: AC
  Filled 2022-12-07: qty 2

## 2022-12-07 MED ORDER — HYDROMORPHONE HCL 1 MG/ML IJ SOLN
INTRAMUSCULAR | Status: AC
  Filled 2022-12-07: qty 0.5

## 2022-12-07 MED ORDER — ESCITALOPRAM OXALATE 10 MG PO TABS
10.0000 mg | ORAL_TABLET | Freq: Every day | ORAL | Status: DC
  Administered 2022-12-08: 10 mg via ORAL
  Filled 2022-12-07: qty 1

## 2022-12-07 MED ORDER — OXYCODONE HCL 5 MG PO TABS
ORAL_TABLET | ORAL | Status: AC
  Filled 2022-12-07: qty 1

## 2022-12-07 MED ORDER — MIDAZOLAM HCL 5 MG/5ML IJ SOLN
INTRAMUSCULAR | Status: DC | PRN
  Administered 2022-12-07: 2 mg via INTRAVENOUS

## 2022-12-07 MED ORDER — PHENYLEPHRINE HCL-NACL 20-0.9 MG/250ML-% IV SOLN
INTRAVENOUS | Status: DC | PRN
  Administered 2022-12-07: 80 ug via INTRAVENOUS

## 2022-12-07 MED ORDER — FERROUS SULFATE 325 (65 FE) MG PO TABS
325.0000 mg | ORAL_TABLET | Freq: Two times a day (BID) | ORAL | Status: DC
  Administered 2022-12-08: 325 mg via ORAL

## 2022-12-07 MED ORDER — DIPHENHYDRAMINE HCL 12.5 MG/5ML PO ELIX: 12.5000 mg | ORAL_SOLUTION | ORAL | Status: DC | PRN

## 2022-12-07 MED ORDER — BUPIVACAINE HCL (PF) 0.5 % IJ SOLN
INTRAMUSCULAR | Status: AC
  Filled 2022-12-07: qty 10

## 2022-12-07 MED ORDER — GLYCOPYRROLATE 0.2 MG/ML IJ SOLN
INTRAMUSCULAR | Status: DC | PRN
  Administered 2022-12-07: .2 mg via INTRAVENOUS

## 2022-12-07 MED ORDER — METOCLOPRAMIDE HCL 10 MG PO TABS
10.0000 mg | ORAL_TABLET | Freq: Three times a day (TID) | ORAL | Status: DC
  Administered 2022-12-07 – 2022-12-08 (×3): 10 mg via ORAL

## 2022-12-07 MED ORDER — FAMOTIDINE 20 MG PO TABS: 20.0000 mg | ORAL_TABLET | Freq: Once | ORAL | Status: AC

## 2022-12-07 MED ORDER — FENTANYL CITRATE (PF) 100 MCG/2ML IJ SOLN
25.0000 ug | INTRAMUSCULAR | Status: DC | PRN
  Administered 2022-12-07 (×2): 25 ug via INTRAVENOUS
  Administered 2022-12-07: 50 ug via INTRAVENOUS

## 2022-12-07 MED ORDER — CELECOXIB 200 MG PO CAPS
200.0000 mg | ORAL_CAPSULE | Freq: Two times a day (BID) | ORAL | Status: DC
  Administered 2022-12-08: 200 mg via ORAL

## 2022-12-07 MED ORDER — CELECOXIB 200 MG PO CAPS
ORAL_CAPSULE | ORAL | Status: AC
  Administered 2022-12-07: 200 mg
  Filled 2022-12-07: qty 1

## 2022-12-07 MED ORDER — BUPIVACAINE HCL (PF) 0.5 % IJ SOLN
INTRAMUSCULAR | Status: DC | PRN
  Administered 2022-12-07: 3 mL via INTRATHECAL

## 2022-12-07 MED ORDER — HYDROMORPHONE HCL 1 MG/ML IJ SOLN
0.5000 mg | INTRAMUSCULAR | Status: DC | PRN
  Administered 2022-12-07: 0.5 mg via INTRAVENOUS

## 2022-12-07 MED ORDER — PROPOFOL 500 MG/50ML IV EMUL
INTRAVENOUS | Status: DC | PRN
  Administered 2022-12-07: 50 ug/kg/min via INTRAVENOUS

## 2022-12-07 MED ORDER — ONDANSETRON HCL 4 MG/2ML IJ SOLN: 4.0000 mg | Freq: Four times a day (QID) | INTRAMUSCULAR | Status: DC | PRN

## 2022-12-07 MED ORDER — ACETAMINOPHEN 10 MG/ML IV SOLN
1000.0000 mg | Freq: Four times a day (QID) | INTRAVENOUS | Status: DC
  Administered 2022-12-07 – 2022-12-08 (×3): 1000 mg via INTRAVENOUS

## 2022-12-07 MED ORDER — OXYCODONE HCL 5 MG PO TABS
5.0000 mg | ORAL_TABLET | ORAL | Status: DC | PRN
  Administered 2022-12-07: 5 mg via ORAL

## 2022-12-07 MED ORDER — CEFAZOLIN SODIUM-DEXTROSE 2-4 GM/100ML-% IV SOLN
2.0000 g | Freq: Four times a day (QID) | INTRAVENOUS | Status: AC
  Administered 2022-12-07 (×2): 2 g via INTRAVENOUS

## 2022-12-07 MED ORDER — IRRISEPT - 450ML BOTTLE WITH 0.05% CHG IN STERILE WATER, USP 99.95% OPTIME
TOPICAL | Status: DC | PRN
  Administered 2022-12-07: 450 mL

## 2022-12-07 MED ORDER — GABAPENTIN 300 MG PO CAPS
ORAL_CAPSULE | ORAL | Status: AC
  Administered 2022-12-07: 300 mg via ORAL
  Filled 2022-12-07: qty 1

## 2022-12-07 MED ORDER — FLEET ENEMA 7-19 GM/118ML RE ENEM: 1.0000 | ENEMA | Freq: Once | RECTAL | Status: DC | PRN

## 2022-12-07 MED ORDER — MENTHOL 3 MG MT LOZG: 1.0000 | LOZENGE | OROMUCOSAL | Status: DC | PRN

## 2022-12-07 MED ORDER — PANTOPRAZOLE SODIUM 40 MG PO TBEC
40.0000 mg | DELAYED_RELEASE_TABLET | Freq: Two times a day (BID) | ORAL | Status: DC
  Administered 2022-12-07 – 2022-12-08 (×2): 40 mg via ORAL

## 2022-12-07 MED ORDER — SODIUM CHLORIDE 0.9 % IV SOLN
INTRAVENOUS | Status: DC | PRN
  Administered 2022-12-07: 60 mL

## 2022-12-07 MED ORDER — DEXAMETHASONE SODIUM PHOSPHATE 10 MG/ML IJ SOLN: 8.0000 mg | Freq: Once | INTRAMUSCULAR | Status: AC

## 2022-12-07 MED ORDER — ORAL CARE MOUTH RINSE: 15.0000 mL | Freq: Once | OROMUCOSAL | Status: AC

## 2022-12-07 MED ORDER — SENNOSIDES-DOCUSATE SODIUM 8.6-50 MG PO TABS
1.0000 | ORAL_TABLET | Freq: Two times a day (BID) | ORAL | Status: DC
  Administered 2022-12-07: 1 via ORAL

## 2022-12-07 MED ORDER — PANTOPRAZOLE SODIUM 40 MG PO TBEC
DELAYED_RELEASE_TABLET | ORAL | Status: AC
  Filled 2022-12-07: qty 1

## 2022-12-07 MED ORDER — TRAMADOL HCL 50 MG PO TABS
50.0000 mg | ORAL_TABLET | ORAL | Status: DC | PRN
  Administered 2022-12-07 – 2022-12-08 (×2): 100 mg via ORAL

## 2022-12-07 MED ORDER — ACETAMINOPHEN 325 MG PO TABS: 325.0000 mg | ORAL_TABLET | Freq: Four times a day (QID) | ORAL | Status: DC | PRN

## 2022-12-07 MED ORDER — ONDANSETRON HCL 4 MG PO TABS: 4.0000 mg | ORAL_TABLET | Freq: Four times a day (QID) | ORAL | Status: DC | PRN

## 2022-12-07 MED ORDER — CHLORHEXIDINE GLUCONATE 0.12 % MT SOLN
OROMUCOSAL | Status: AC
  Administered 2022-12-07: 15 mL via OROMUCOSAL
  Filled 2022-12-07: qty 15

## 2022-12-07 MED ORDER — FENTANYL CITRATE (PF) 100 MCG/2ML IJ SOLN
INTRAMUSCULAR | Status: DC | PRN
  Administered 2022-12-07 (×2): 50 ug via INTRAVENOUS

## 2022-12-07 MED ORDER — OXYCODONE HCL 5 MG PO TABS
10.0000 mg | ORAL_TABLET | ORAL | Status: DC | PRN
  Administered 2022-12-08: 10 mg via ORAL

## 2022-12-07 MED ORDER — ALUM & MAG HYDROXIDE-SIMETH 200-200-20 MG/5ML PO SUSP: 30.0000 mL | ORAL | Status: DC | PRN

## 2022-12-07 MED ORDER — DEXMEDETOMIDINE HCL IN NACL 200 MCG/50ML IV SOLN
INTRAVENOUS | Status: DC | PRN
  Administered 2022-12-07: 12 ug via INTRAVENOUS
  Administered 2022-12-07 (×2): 8 ug via INTRAVENOUS

## 2022-12-07 MED ORDER — BUPIVACAINE HCL (PF) 0.25 % IJ SOLN
INTRAMUSCULAR | Status: DC | PRN
  Administered 2022-12-07: 60 mL

## 2022-12-07 MED ORDER — CHLORHEXIDINE GLUCONATE 0.12 % MT SOLN: 15.0000 mL | Freq: Once | OROMUCOSAL | Status: AC

## 2022-12-07 MED ORDER — FAMOTIDINE 20 MG PO TABS
ORAL_TABLET | ORAL | Status: AC
  Administered 2022-12-07: 20 mg via ORAL
  Filled 2022-12-07: qty 1

## 2022-12-07 MED ORDER — FENTANYL CITRATE (PF) 100 MCG/2ML IJ SOLN
INTRAMUSCULAR | Status: AC
  Filled 2022-12-07: qty 2

## 2022-12-07 MED ORDER — TETRACAINE HCL 1 % IJ SOLN
INTRAMUSCULAR | Status: AC
  Filled 2022-12-07: qty 2

## 2022-12-07 SURGICAL SUPPLY — 71 items
ATTUNE MED DOME PAT 38 KNEE (Knees) IMPLANT
ATTUNE PSFEM RTSZ6 NARCEM KNEE (Femur) IMPLANT
ATTUNE PSRP INSR SZ6 8 KNEE (Insert) IMPLANT
BASE TIBIAL ROT PLAT SZ 5 KNEE (Knees) IMPLANT
BATTERY INSTRU NAVIGATION (MISCELLANEOUS) ×4 IMPLANT
BLADE SAW 70X12.5 (BLADE) ×1 IMPLANT
BLADE SAW 90X13X1.19 OSCILLAT (BLADE) ×1 IMPLANT
BLADE SAW 90X25X1.19 OSCILLAT (BLADE) ×1 IMPLANT
BONE CEMENT GENTAMICIN (Cement) ×2 IMPLANT
CEMENT BONE GENTAMICIN 40 (Cement) IMPLANT
COOLER POLAR GLACIER W/PUMP (MISCELLANEOUS) ×1 IMPLANT
CUFF TOURN SGL QUICK 24 (TOURNIQUET CUFF)
CUFF TOURN SGL QUICK 34 (TOURNIQUET CUFF)
CUFF TRNQT CYL 24X4X16.5-23 (TOURNIQUET CUFF) IMPLANT
CUFF TRNQT CYL 34X4.125X (TOURNIQUET CUFF) IMPLANT
DRAPE 3/4 80X56 (DRAPES) ×1 IMPLANT
DRAPE INCISE IOBAN 66X45 STRL (DRAPES) IMPLANT
DRSG MEPILEX SACRM 8.7X9.8 (GAUZE/BANDAGES/DRESSINGS) ×1 IMPLANT
DRSG NON-ADHERENT DERMACEA 3X4 (GAUZE/BANDAGES/DRESSINGS) ×1 IMPLANT
DRSG OPSITE POSTOP 4X12 (GAUZE/BANDAGES/DRESSINGS) IMPLANT
DRSG OPSITE POSTOP 4X14 (GAUZE/BANDAGES/DRESSINGS) ×1 IMPLANT
DRSG TEGADERM 4X4.75 (GAUZE/BANDAGES/DRESSINGS) ×1 IMPLANT
DRSG TELFA 4X3 1S NADH ST (GAUZE/BANDAGES/DRESSINGS) IMPLANT
DURAPREP 26ML APPLICATOR (WOUND CARE) ×2 IMPLANT
ELECT CAUTERY BLADE 6.4 (BLADE) ×1 IMPLANT
ELECT REM PT RETURN 9FT ADLT (ELECTROSURGICAL) ×1
ELECTRODE REM PT RTRN 9FT ADLT (ELECTROSURGICAL) ×1 IMPLANT
EX-PIN ORTHOLOCK NAV 4X150 (PIN) ×2 IMPLANT
GLOVE BIOGEL M STRL SZ7.5 (GLOVE) ×2 IMPLANT
GLOVE SRG 8 PF TXTR STRL LF DI (GLOVE) ×1 IMPLANT
GLOVE SURG UNDER POLY LF SZ8 (GLOVE) ×1
GOWN STRL REUS W/ TWL LRG LVL3 (GOWN DISPOSABLE) ×1 IMPLANT
GOWN STRL REUS W/TWL LRG LVL3 (GOWN DISPOSABLE) ×1
GOWN TOGA ZIPPER T7+ PEEL AWAY (MISCELLANEOUS) ×2 IMPLANT
HEMOVAC 400CC 10FR (MISCELLANEOUS) ×1 IMPLANT
HOLDER FOLEY CATH W/STRAP (MISCELLANEOUS) ×1 IMPLANT
IV NS IRRIG 3000ML ARTHROMATIC (IV SOLUTION) ×1 IMPLANT
KIT TURNOVER KIT A (KITS) ×1 IMPLANT
KNIFE SCULPS 14X20 (INSTRUMENTS) ×1 IMPLANT
MANIFOLD NEPTUNE II (INSTRUMENTS) ×2 IMPLANT
NDL SPNL 20GX3.5 QUINCKE YW (NEEDLE) ×2 IMPLANT
NEEDLE SPNL 20GX3.5 QUINCKE YW (NEEDLE) ×2 IMPLANT
NS IRRIG 500ML POUR BTL (IV SOLUTION) ×1 IMPLANT
PACK TOTAL KNEE (MISCELLANEOUS) ×1 IMPLANT
PAD ABD DERMACEA PRESS 5X9 (GAUZE/BANDAGES/DRESSINGS) ×2 IMPLANT
PAD WRAPON POLAR KNEE (MISCELLANEOUS) ×1 IMPLANT
PIN DRILL FIX HALF THREAD (BIT) ×2 IMPLANT
PIN DRILL QUICK PACK ×2 IMPLANT
PIN FIXATION 1/8DIA X 3INL (PIN) ×1 IMPLANT
PULSAVAC PLUS IRRIG FAN TIP (DISPOSABLE) ×1
SOL PREP PVP 2OZ (MISCELLANEOUS) ×1
SOLUTION IRRIG SURGIPHOR (IV SOLUTION) ×1 IMPLANT
SOLUTION PREP PVP 2OZ (MISCELLANEOUS) ×1 IMPLANT
SPONGE DRAIN TRACH 4X4 STRL 2S (GAUZE/BANDAGES/DRESSINGS) ×1 IMPLANT
STAPLER SKIN PROX 35W (STAPLE) ×1 IMPLANT
STOCKINETTE IMPERV 14X48 (MISCELLANEOUS) ×1 IMPLANT
STRAP TIBIA SHORT (MISCELLANEOUS) ×1 IMPLANT
SUCTION FRAZIER HANDLE 10FR (MISCELLANEOUS) ×1
SUCTION TUBE FRAZIER 10FR DISP (MISCELLANEOUS) ×1 IMPLANT
SUT VIC AB 0 CT1 36 (SUTURE) ×1 IMPLANT
SUT VIC AB 1 CT1 36 (SUTURE) ×2 IMPLANT
SUT VIC AB 2-0 CT2 27 (SUTURE) ×1 IMPLANT
SYR 30ML LL (SYRINGE) ×2 IMPLANT
TIBIAL BASE ROT PLAT SZ 5 KNEE (Knees) ×1 IMPLANT
TIP FAN IRRIG PULSAVAC PLUS (DISPOSABLE) ×1 IMPLANT
TOWEL OR 17X26 4PK STRL BLUE (TOWEL DISPOSABLE) IMPLANT
TOWER CARTRIDGE SMART MIX (DISPOSABLE) ×1 IMPLANT
TRAP FLUID SMOKE EVACUATOR (MISCELLANEOUS) ×1 IMPLANT
TRAY FOLEY MTR SLVR 16FR STAT (SET/KITS/TRAYS/PACK) ×1 IMPLANT
WATER STERILE IRR 1000ML POUR (IV SOLUTION) ×1 IMPLANT
WRAPON POLAR PAD KNEE (MISCELLANEOUS) ×1

## 2022-12-07 NOTE — Anesthesia Procedure Notes (Signed)
Spinal  Patient location during procedure: OR Reason for block: surgical anesthesia Staffing Performed: resident/CRNA  Anesthesiologist: Ilene Qua, MD Resident/CRNA: Rolla Plate, CRNA Performed by: Rolla Plate, CRNA Authorized by: Ilene Qua, MD   Preanesthetic Checklist Completed: patient identified, IV checked, site marked, risks and benefits discussed, surgical consent, monitors and equipment checked, pre-op evaluation and timeout performed Spinal Block Patient position: sitting Prep: ChloraPrep and site prepped and draped Patient monitoring: heart rate, continuous pulse ox, blood pressure and cardiac monitor Approach: midline Location: L4-5 Injection technique: single-shot Needle Needle type: Introducer and Pencan  Needle gauge: 24 G Needle length: 9 cm Assessment Events: CSF return Additional Notes Negative paresthesia. Negative blood return. Positive free-flowing CSF. Expiration date of kit checked and confirmed. Patient tolerated procedure well, without complications.

## 2022-12-07 NOTE — Op Note (Signed)
OPERATIVE NOTE  DATE OF SURGERY:  12/07/2022  PATIENT NAME:  Stephanie [name]   DOB: December 16, 1960  MRN: [id number]  PRE-OPERATIVE DIAGNOSIS: Degenerative arthrosis of the right knee, primary  POST-OPERATIVE DIAGNOSIS:  Same  PROCEDURE:  Right total knee arthroplasty using computer-assisted navigation  SURGEON:  Marciano Sequin. M.D.  ANESTHESIA: spinal  ESTIMATED BLOOD LOSS: 50 mL  FLUIDS REPLACED: 1600 mL of crystalloid  TOURNIQUET TIME: 83 minutes  DRAINS: 2 medium Hemovac drains  SOFT TISSUE RELEASES: Anterior cruciate ligament, posterior cruciate ligament, deep medial collateral ligament, patellofemoral ligament  IMPLANTS UTILIZED: DePuy Attune size 6N posterior stabilized femoral component (cemented), size 5 rotating platform tibial component (cemented), 38 mm medialized dome patella (cemented), and an 8 mm stabilized rotating platform polyethylene insert.  INDICATIONS FOR SURGERY: Stephanie [name] is a 62 y.o. year old female with a long history of progressive knee pain. X-rays demonstrated severe degenerative changes in tricompartmental fashion. The patient had not seen any significant improvement despite conservative nonsurgical intervention. After discussion of the risks and benefits of surgical intervention, the patient expressed understanding of the risks benefits and agree with plans for total knee arthroplasty.   The risks, benefits, and alternatives were discussed at length including but not limited to the risks of infection, bleeding, nerve injury, stiffness, blood clots, the need for revision surgery, cardiopulmonary complications, among others, and they were willing to proceed.  PROCEDURE IN DETAIL: The patient was brought into the operating room and, after adequate spinal anesthesia was achieved, a tourniquet was placed on the patient's upper thigh. The patient's knee and leg were cleaned and prepped with alcohol and DuraPrep and draped in the usual sterile  fashion. A "timeout" was performed as per usual protocol. The lower extremity was exsanguinated using an Esmarch, and the tourniquet was inflated to 300 mmHg. An anterior longitudinal incision was made followed by a standard mid vastus approach. The deep fibers of the medial collateral ligament were elevated in a subperiosteal fashion off of the medial flare of the tibia so as to maintain a continuous soft tissue sleeve. The patella was subluxed laterally and the patellofemoral ligament was incised. Inspection of the knee demonstrated severe degenerative changes with full-thickness loss of articular cartilage. Osteophytes were debrided using a rongeur. Anterior and posterior cruciate ligaments were excised. Two 4.0 mm Schanz pins were inserted in the femur and into the tibia for attachment of the array of trackers used for computer-assisted navigation. Hip center was identified using a circumduction technique. Distal landmarks were mapped using the computer. The distal femur and proximal tibia were mapped using the computer. The distal femoral cutting guide was positioned using computer-assisted navigation so as to achieve a 5 distal valgus cut. The femur was sized and it was felt that a size 6N femoral component was appropriate. A size 6 femoral cutting guide was positioned and the anterior cut was performed and verified using the computer. This was followed by completion of the posterior and chamfer cuts. Femoral cutting guide for the central box was then positioned in the center box cut was performed.  Attention was then directed to the proximal tibia. Medial and lateral menisci were excised. The extramedullary tibial cutting guide was positioned using computer-assisted navigation so as to achieve a 0 varus-valgus alignment and 3 posterior slope. The cut was performed and verified using the computer. The proximal tibia was sized and it was felt that a size 5 tibial tray was appropriate. Tibial and femoral  trials were inserted followed  by insertion of an 8 mm polyethylene insert. This allowed for excellent mediolateral soft tissue balancing both in flexion and in full extension. Finally, the patella was cut and prepared so as to accommodate a 38 mm medialized dome patella. A patella trial was placed and the knee was placed through a range of motion with excellent patellar tracking appreciated. The femoral trial was removed after debridement of posterior osteophytes. The central post-hole for the tibial component was reamed followed by insertion of a [name] punch. Tibial trials were then removed. Cut surfaces of bone were irrigated with copious amounts of normal saline using pulsatile lavage and then suctioned dry. Polymethylmethacrylate cement with gentamicin was prepared in the usual fashion using a vacuum mixer. Cement was applied to the cut surface of the proximal tibia as well as along the undersurface of a size 5 rotating platform tibial component. Tibial component was positioned and impacted into place. Excess cement was removed using [employer] fast streamer. Cement was then applied to the cut surfaces of the femur as well as along the posterior flanges of the size 6N femoral component. The femoral component was positioned and impacted into place. Excess cement was removed using [employer] fast streamer. An 8 mm polyethylene trial was inserted and the knee was brought into full extension with steady axial compression applied. Finally, cement was applied to the backside of a 38 mm medialized dome patella and the patellar component was positioned and patellar clamp applied. Excess cement was removed using [employer] fast streamer. After adequate curing of the cement, the tourniquet was deflated after a total tourniquet time of 83 minutes. Hemostasis was achieved using electrocautery. The knee was irrigated with copious amounts of normal saline using pulsatile lavage followed by 450 ml of Irrisept and then suctioned dry. 20 mL of 1.3% Exparel  and 60 mL of 0.25% Marcaine in 40 mL of normal saline was injected along the posterior capsule, medial and lateral gutters, and along the arthrotomy site. An 8 mm stabilized rotating platform polyethylene insert was inserted and the knee was placed through a range of motion with excellent mediolateral soft tissue balancing appreciated and excellent patellar tracking noted. 2 medium drains were placed in the wound bed and brought out through separate stab incisions. The medial parapatellar portion of the incision was reapproximated using interrupted sutures of #1 Vicryl. Subcutaneous tissue was approximated in layers using first #0 Vicryl followed #2-0 Vicryl. The skin was approximated with skin staples. A sterile dressing was applied.  The patient tolerated the procedure well and was transported to the recovery room in stable condition.    Stephanie [name] P. Holley Bouche., M.D.

## 2022-12-07 NOTE — Progress Notes (Signed)
PT Cancellation Note  Patient Details Name: Stephanie [name] MRN: [id number] DOB: 1960-12-03   Cancelled Treatment:    Reason Eval/Treat Not Completed: Other (comment).  PT consult received.  Chart reviewed.  Pt s/p R TKA.  Pt reporting and demonstrating impaired ankle strength (pt s/p spinal anesthesia) limiting mobility at this time.  Pt also noted with recent BP of 95/56.  Will re-attempt PT evaluation tomorrow.  Leitha Bleak, PT 12/07/22, 5:06 PM

## 2022-12-07 NOTE — Interval H&P Note (Signed)
History and Physical Interval Note:  12/07/2022 10:46 AM  Stephanie [name]  has presented today for surgery, with the diagnosis of PRIMARY OSTEOARTHRITITS OF RIGHT KNEE..  The various methods of treatment have been discussed with the patient and family. After consideration of risks, benefits and other options for treatment, the patient has consented to  Procedure(s): COMPUTER ASSISTED TOTAL KNEE ARTHROPLASTY (Right) as a surgical intervention.  The patient's history has been reviewed, patient examined, no change in status, stable for surgery.  I have reviewed the patient's chart and labs.  Questions were answered to the patient's satisfaction.     [place]

## 2022-12-07 NOTE — Anesthesia Preprocedure Evaluation (Signed)
Anesthesia Evaluation  Patient identified by MRN, date of birth, ID band Patient awake    Reviewed: Allergy & Precautions, NPO status , Patient's Chart, lab work & pertinent test results  Airway Mallampati: II  TM Distance: >3 FB Neck ROM: full    Dental no notable dental hx.    Pulmonary neg pulmonary ROS   Pulmonary exam normal        Cardiovascular Exercise Tolerance: Good (-) angina (-) DOE Normal cardiovascular exam     Neuro/Psych  PSYCHIATRIC DISORDERS  Depression     Neuromuscular disease    GI/Hepatic negative GI ROS, Neg liver ROS,,,  Endo/Other  negative endocrine ROSdiabetes, Oral Hypoglycemic Agents    Renal/GU      Musculoskeletal  (+) Arthritis ,  Chronic back pain on narcotics     Abdominal   Peds  Hematology  (+) Blood dyscrasia, anemia   Anesthesia Other Findings Past Medical History: No date: Allergy 04/09/2014: Anemia, iron deficiency No date: Arthritis 04/09/2014: Atypical chest pain 03/01/2014: CD (contact dermatitis) 04/09/2014: Chest pain No date: Chronic pain syndrome No date: Degenerative disc disease, lumbar No date: Depression No date: Diabetes mellitus without complication (Gutierrez) 52/77/8242: Lumbar radicular pain No date: Lumbar spinal stenosis No date: Osteoarthritis No date: Vitamin B12 deficiency No date: Vitamin D deficiency  Past Surgical History: 2015: APPENDECTOMY 2014: BUNIONECTOMY; Bilateral 1990: CESAREAN SECTION 1991: CESAREAN SECTION 2014: CHOLECYSTECTOMY 2014: KNEE ARTHROSCOPY; Right 2010: LUMBAR SPINE SURGERY     Comment:  spinal stenosis L5; rods  BMI    Body Mass Index: 25.06 kg/m      Reproductive/Obstetrics negative OB ROS                             Anesthesia Physical Anesthesia Plan  ASA: 2  Anesthesia Plan: Spinal   Post-op Pain Management: Ofirmev IV (intra-op)* and Toradol IV (intra-op)*   Induction:  Intravenous  PONV Risk Score and Plan: 2  Airway Management Planned: Natural Airway and Nasal Cannula  Additional Equipment:   Intra-op Plan:   Post-operative Plan:   Informed Consent: I have reviewed the patients History and Physical, chart, labs and discussed the procedure including the risks, benefits and alternatives for the proposed anesthesia with the patient or authorized representative who has indicated his/her understanding and acceptance.     Dental Advisory Given  Plan Discussed with: Anesthesiologist, CRNA and Surgeon  Anesthesia Plan Comments: (Patient reports no bleeding problems and no anticoagulant use.  Plan for spinal with backup GA  Patient consented for risks of anesthesia including but not limited to:  - adverse reactions to medications - damage to eyes, teeth, lips or other oral mucosa - nerve damage due to positioning  - risk of bleeding, infection and or nerve damage from spinal that could lead to paralysis - risk of headache or failed spinal - damage to teeth, lips or other oral mucosa - sore throat or hoarseness - damage to heart, brain, nerves, lungs, other parts of body or loss of life  Patient voiced understanding.)       Anesthesia Quick Evaluation

## 2022-12-07 NOTE — Transfer of Care (Signed)
Immediate Anesthesia Transfer of Care Note  Patient: Stephanie [name]  Procedure(s) Performed: COMPUTER ASSISTED TOTAL KNEE ARTHROPLASTY (Right: Knee)  Patient Location: PACU  Anesthesia Type:Spinal  Level of Consciousness: awake, alert , and oriented  Airway & Oxygen Therapy: Patient Spontanous Breathing and Patient connected to face mask oxygen  Post-op Assessment: Report given to RN and Post -op Vital signs reviewed and stable  Post vital signs: Reviewed and stable  Last Vitals:  Vitals Value Taken Time  BP 97/67 12/07/22 1441  Temp    Pulse 87 12/07/22 1446  Resp 20 12/07/22 1446  SpO2 93 % 12/07/22 1446  Vitals shown include unvalidated device data.  Last Pain:  Vitals:   12/07/22 0902  TempSrc: Temporal  PainSc: 0-No pain      Patients Stated Pain Goal: 0 (01/72/09 1068)  Complications: No notable events documented.

## 2022-12-08 ENCOUNTER — Encounter: Payer: Self-pay | Admitting: Orthopedic Surgery

## 2022-12-08 DIAGNOSIS — M1711 Unilateral primary osteoarthritis, right knee: Secondary | ICD-10-CM | POA: Diagnosis not present

## 2022-12-08 LAB — GLUCOSE, CAPILLARY: Glucose-Capillary: 166 mg/dL — ABNORMAL HIGH (ref 70–99)

## 2022-12-08 MED ORDER — TRAMADOL HCL 50 MG PO TABS: 50.0000 mg | ORAL_TABLET | ORAL | 0 refills | Status: DC | PRN

## 2022-12-08 MED ORDER — PANTOPRAZOLE SODIUM 40 MG PO TBEC
DELAYED_RELEASE_TABLET | ORAL | Status: AC
  Filled 2022-12-08: qty 1

## 2022-12-08 MED ORDER — CELECOXIB 200 MG PO CAPS
ORAL_CAPSULE | ORAL | Status: AC
  Filled 2022-12-08: qty 1

## 2022-12-08 MED ORDER — OXYCODONE HCL 5 MG PO TABS
ORAL_TABLET | ORAL | Status: AC
  Filled 2022-12-08: qty 2

## 2022-12-08 MED ORDER — ENOXAPARIN SODIUM 30 MG/0.3ML IJ SOSY
PREFILLED_SYRINGE | INTRAMUSCULAR | Status: AC
  Filled 2022-12-08: qty 0.3

## 2022-12-08 MED ORDER — SENNA 8.6 MG PO TABS
ORAL_TABLET | ORAL | Status: AC
  Administered 2022-12-08: 8.6 mg
  Filled 2022-12-08: qty 1

## 2022-12-08 MED ORDER — CELECOXIB 200 MG PO CAPS: 200.0000 mg | ORAL_CAPSULE | Freq: Two times a day (BID) | ORAL | 0 refills | Status: DC

## 2022-12-08 MED ORDER — ENOXAPARIN SODIUM 40 MG/0.4ML IJ SOSY: 40.0000 mg | PREFILLED_SYRINGE | INTRAMUSCULAR | 0 refills | Status: DC

## 2022-12-08 MED ORDER — ACETAMINOPHEN 10 MG/ML IV SOLN
INTRAVENOUS | Status: AC
  Filled 2022-12-08: qty 100

## 2022-12-08 MED ORDER — MAGNESIUM HYDROXIDE 400 MG/5ML PO SUSP
ORAL | Status: AC
  Filled 2022-12-08: qty 30

## 2022-12-08 MED ORDER — ONDANSETRON HCL 4 MG PO TABS: 4.0000 mg | ORAL_TABLET | Freq: Four times a day (QID) | ORAL | 0 refills | Status: DC | PRN

## 2022-12-08 MED ORDER — OXYCODONE HCL 5 MG PO TABS: 5.0000 mg | ORAL_TABLET | ORAL | 0 refills | Status: DC | PRN

## 2022-12-08 MED ORDER — TRAMADOL HCL 50 MG PO TABS
ORAL_TABLET | ORAL | Status: AC
  Filled 2022-12-08: qty 2

## 2022-12-08 MED ORDER — METOCLOPRAMIDE HCL 10 MG PO TABS
ORAL_TABLET | ORAL | Status: AC
  Filled 2022-12-08: qty 1

## 2022-12-08 MED ORDER — FERROUS SULFATE 325 (65 FE) MG PO TABS
ORAL_TABLET | ORAL | Status: AC
  Filled 2022-12-08: qty 1

## 2022-12-08 NOTE — Progress Notes (Signed)
  Subjective: 1 Day Post-Op Procedure(s) (LRB): COMPUTER ASSISTED TOTAL KNEE ARTHROPLASTY (Right) Patient reports pain as 6 on 0-10 scale.   Patient is well, and has had no acute complaints or problems Plan is to go Home after hospital stay. Negative for chest pain and shortness of breath Fever: no Gastrointestinal:Negative for nausea and vomiting Patient is passing gas this morning, urinating without issue.  Objective: Vital signs in last 24 hours: Temp:  [97 F (36.1 C)-98.1 F (36.7 C)] 97.9 F (36.6 C) (02/06 0758) Pulse Rate:  [56-91] 59 (02/06 0758) Resp:  [7-24] 16 (02/06 0758) BP: (86-110)/(48-73) 102/59 (02/06 0758) SpO2:  [91 %-99 %] 98 % (02/06 0758)  Intake/Output from previous day:  Intake/Output Summary (Last 24 hours) at 12/08/2022 1340 Last data filed at 12/08/2022 0307 Gross per 24 hour  Intake 1794.49 ml  Output 1410 ml  Net 384.49 ml    Intake/Output this shift: No intake/output data recorded.  Labs: No results for input(s): "HGB" in the last 72 hours. No results for input(s): "WBC", "RBC", "HCT", "PLT" in the last 72 hours. No results for input(s): "NA", "K", "CL", "CO2", "BUN", "CREATININE", "GLUCOSE", "CALCIUM" in the last 72 hours. No results for input(s): "LABPT", "INR" in the last 72 hours.   EXAM General - Patient is Alert, Appropriate, and Oriented Extremity - ABD soft Neurovascular intact Dorsiflexion/Plantar flexion intact Incision: bulky dressing intact to the right leg. No cellulitis present Dressing/Incision - Bulky dressing has been removed to the right leg.  Honeycomb intact without drainage.  Hemovac removed without issue. Motor Function - intact, moving foot and toes well on exam.  Abdomen soft with intact bowel sounds.  Past Medical History:  Diagnosis Date   Allergy    Anemia, iron deficiency 04/09/2014   Arthritis    Atypical chest pain 04/09/2014   CD (contact dermatitis) 03/01/2014   Chest pain 04/09/2014   Chronic pain  syndrome    Degenerative disc disease, lumbar    Depression    Diabetes mellitus without complication (HCC)    Lumbar radicular pain 08/22/2015   Lumbar spinal stenosis    Osteoarthritis    Vitamin B12 deficiency    Vitamin D deficiency     Assessment/Plan: 1 Day Post-Op Procedure(s) (LRB): COMPUTER ASSISTED TOTAL KNEE ARTHROPLASTY (Right) Principal Problem:   Total knee replacement status  Estimated body mass index is 25.06 kg/m as calculated from the following:   Height as of this encounter: '5\' 7"'$  (1.702 m).   Weight as of this encounter: 72.6 kg. Advance diet Up with therapy D/C IV fluids when tolerating po intake.  Vitals reviewed this AM. Hemovac removed this morning without issue. Up with therapy today. Plan for discharge home pending progress with PT.  DVT Prophylaxis - Lovenox, TED hose, and SCDs Weight-Bearing as tolerated to right leg  J. Cameron Proud, PA-C [facility] Orthopaedic Surgery 12/08/2022, 1:40 PM

## 2022-12-08 NOTE — Evaluation (Signed)
Occupational Therapy Evaluation Patient Details Name: Tammela Bales MRN: [id number] DOB: 06/01/1961 Today's Date: 12/08/2022   History of Present Illness Pt is a 62 y.o. 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  PMH includes DM, anemia, chronic pain syndrome, DDD, lumbar radicular pain, vitamin B12 and D deficiency, L-spine sx.   Clinical Impression   Patient seated in recliner chair and agreeable to OT evaluation. Patient reports being Ind at baseline and lives with significant other who is available to assist as needed at discharge. OT reviewed polar care system but significant other was quite familiar with how to utilize and felt comfortable. OT educated pt on dressing techniques to increase Ind with self care. Pt able to don LB clothing items with min guard for balance when standing to pull over B hips. Pt sits to don UB clothing items with set up A. Paper handouts provided for pt and family to refer to once home. No further questions at this time and pt is motivated to return home. OT education completed and no further need for acute OT intervention. OT to sign off.      Recommendations for follow up therapy are one component of a multi-disciplinary discharge planning process, led by the attending physician.  Recommendations may be updated based on patient status, additional functional criteria and insurance authorization.   Follow Up Recommendations  No OT follow up     Assistance Recommended at Discharge Intermittent Supervision/Assistance  Patient can return home with the following A little help with walking and/or transfers;Assist for transportation;Assistance with cooking/housework;Help with stairs or ramp for entrance       Equipment Recommendations  BSC/3in1;Other (comment) (RW)       Precautions / Restrictions Precautions Precautions: Fall;Knee Precaution Booklet Issued: Yes (comment) Precaution Comments: KI if unable to SLR  independently Restrictions Weight Bearing Restrictions: Yes RLE Weight Bearing: Weight bearing as tolerated      Mobility Bed Mobility               General bed mobility comments: seated in recliner chair    Transfers Overall transfer level: Needs assistance Equipment used: Rolling walker (2 wheels) Transfers: Sit to/from Stand Sit to Stand: Min guard, Supervision                  Balance Overall balance assessment: Needs assistance Sitting-balance support: No upper extremity supported, Feet supported Sitting balance-Leahy Scale: Normal     Standing balance support: Bilateral upper extremity supported, During functional activity Standing balance-Leahy Scale: Good                             ADL either performed or assessed with clinical judgement   ADL Overall ADL's : Needs assistance/impaired                 Upper Body Dressing : Set up;Supervision/safety;Sitting   Lower Body Dressing: Min guard;Sit to/from stand               Functional mobility during ADLs: Supervision/safety;Rolling walker (2 wheels)       Vision Patient Visual Report: No change from baseline              Pertinent Vitals/Pain Pain Assessment Pain Assessment: 0-10 Pain Score: 3  Pain Location: R knee Pain Descriptors / Indicators: Aching, Discomfort Pain Intervention(s): Monitored during session, Ice applied, Repositioned, Premedicated before session     Hand Dominance Right   Extremity/Trunk Assessment  Upper Extremity Assessment Upper Extremity Assessment: Overall WFL for tasks assessed   Lower Extremity Assessment Lower Extremity Assessment: Defer to PT evaluation RLE Deficits / Details: able to perform R LE SLR independently; at least 3/5 AROM hip flexion and ankle DF/PF RLE: Unable to fully assess due to pain LLE Deficits / Details: L knee flexion limited (at least 100 degrees)--pt reports needing L knee surgery as well   Cervical / Trunk  Assessment Cervical / Trunk Assessment: Normal   Communication Communication Communication: No difficulties   Cognition Arousal/Alertness: Awake/alert Behavior During Therapy: WFL for tasks assessed/performed Overall Cognitive Status: Within Functional Limits for tasks assessed                                                  Home Living Family/patient expects to be discharged to:: Private residence Living Arrangements: Spouse/significant other (Pt's fiance) Available Help at Discharge: Available 24 hours/day Type of Home: House Home Access: Stairs to enter [employer] of Steps: 3 Entrance Stairs-Rails: None Home Layout: One level     Bathroom Shower/Tub: Tub/shower unit;Walk-in shower   Bathroom Toilet: Handicapped height     Home Equipment: Grab bars - tub/shower;Shower seat - built in;Other (comment)   Additional Comments: Hand held shower head      Prior Functioning/Environment Prior Level of Function : Independent/Modified Independent             Mobility Comments: No recent falls.                   OT Goals(Current goals can be found in the care plan section) Acute Rehab OT Goals Patient Stated Goal: to go home OT Goal Formulation: With patient/family Time For Goal Achievement: 12/22/22 Potential to Achieve Goals: Good  OT Frequency:         AM-PAC OT "6 Clicks" Daily Activity     Outcome Measure Help from another person eating meals?: None Help from another person taking care of personal grooming?: None Help from another person toileting, which includes using toliet, bedpan, or urinal?: A Little Help from another person bathing (including washing, rinsing, drying)?: None Help from another person to put on and taking off regular upper body clothing?: None Help from another person to put on and taking off regular lower body clothing?: A Little 6 Click Score: 22   End of Session Equipment Utilized During Treatment:  Rolling walker (2 wheels) Nurse Communication: Mobility status  Activity Tolerance: Patient tolerated treatment well Patient left: in chair;with family/visitor present                   Time: 4098-1191 OT Time Calculation (min): 16 min Charges:  OT General Charges $OT Visit: 1 Visit OT Evaluation $OT Eval Low Complexity: 1 Low OT Treatments $Self Care/Home Management : 8-22 mins  Darleen Crocker, MS, OTR/L , CBIS ascom [phone]  12/08/22, 12:43 PM

## 2022-12-08 NOTE — Anesthesia Postprocedure Evaluation (Signed)
Anesthesia Post Note  Patient: Stephanie [name]  Procedure(s) Performed: COMPUTER ASSISTED TOTAL KNEE ARTHROPLASTY (Right: Knee)  Patient location during evaluation: Nursing Unit Anesthesia Type: Spinal Level of consciousness: oriented and awake and alert Pain management: pain level controlled Vital Signs Assessment: post-procedure vital signs reviewed and stable Respiratory status: spontaneous breathing and respiratory function stable Cardiovascular status: blood pressure returned to baseline and stable Postop Assessment: no headache, no backache, no apparent nausea or vomiting and patient able to bend at knees Anesthetic complications: no   There were no known notable events for this encounter.   Last Vitals:  Vitals:   12/08/22 0015 12/08/22 0758  BP: (!) 110/58 (!) 102/59  Pulse: 78 (!) 59  Resp: 14 16  Temp: 36.6 C 36.6 C  SpO2: 97% 98%    Last Pain:  Vitals:   12/08/22 0758  TempSrc: Oral  PainSc:                  Lia Foyer

## 2022-12-08 NOTE — TOC Progression Note (Signed)
Transition of Care [facility]) - Progression Note    Patient Details  Name: Stephanie [name] MRN: [id number] Date of Birth: [birth year]  [facility] CM/SW Contact  Conception Oms, RN Phone Number: 12/08/2022, 9:54 AM  Clinical Narrative:    Patient is set up with [place] for [facility] services RW and 3 in 1 to be delivered by [employer] to the bedside     Expected Discharge Plan: [place] Barriers to Discharge: No Barriers Identified  Expected Discharge Plan and Services   Discharge Planning Services: CM Consult   Living arrangements for the past 2 months: Single Family Home                 DME Arranged: Walker rolling, 3-N-1 DME Agency: [employer] Date DME Agency Contacted: 12/08/22 Time DME Agency Contacted: [phone] Representative spoke with at DME Agency: [place]: PT [place]: [place] Date [place]: 12/08/22 Time [place]: [phone] Representative spoke with at [place]: [place]   Social Determinants of Health ([place]) Interventions SDOH Screenings   Food Insecurity: No Food Insecurity (12/07/2022)  Housing: Low Risk  (12/07/2022)  Transportation Needs: No Transportation Needs (12/07/2022)  Utilities: Not At Risk (12/07/2022)  Depression (PHQ2-9): Low Risk  (07/27/2022)  Tobacco Use: Low Risk  (12/07/2022)    Readmission Risk Interventions     No data to display

## 2022-12-08 NOTE — Discharge Summary (Signed)
Physician Discharge Summary  Patient ID: Stephanie [name] MRN: [id number] DOB/AGE: February 17, 1961 62 y.o.  Admit date: 12/07/2022 Discharge date: 12/08/2022  Admission Diagnoses:  Total knee replacement status [Z96.659] Primary osteoarthritis of the right knee  Discharge Diagnoses: Patient Active Problem List   Diagnosis Date Noted   Total knee replacement status 12/07/2022   Chronic use of opiate for therapeutic purpose 16/11/930   Uncomplicated opioid dependence ([place]) 12/30/2020   Type 2 diabetes mellitus without complication, without long-term current use of insulin ([place]) 09/04/2020   Depression 05/27/2020   DDD (degenerative disc disease), lumbosacral 11/27/2019   Acute exacerbation of chronic low back pain 10/25/2019   Lumbar spondylitis ([place]) 10/25/2019   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) 10/04/2019   Lumbosacral radiculopathy at L5 (Right) 10/04/2019   Chronic upper extremity pain (Bilateral) 10/04/2019   Cervical radiculitis (Bilateral) 10/04/2019   Cervicalgia 10/04/2019   Abnormal MRI, knee (04/04/2018) 05/22/2019   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   Eczema of hands (Bilateral) (R>L) 03/29/2019   Pharmacologic therapy 07/06/2018   Disorder of skeletal system 07/06/2018   Problems influencing health status 07/06/2018   Chronic pain syndrome 12/24/2016   Musculoskeletal pain 04/15/2016   Neurogenic pain 04/15/2016   Spasm of paraspinal muscle 04/15/2016   Chronic hip pain (Bilateral) (R>L) 04/15/2016   Osteoarthritis of knee (Bilateral) 04/15/2016   Osteoarthritis of hip (Bilateral) 04/15/2016   Osteoarthritis of sacroiliac joint (Bilateral) 04/15/2016   Chronic sacroiliac joint pain (Bilateral) 04/15/2016   Chronic lower extremity pain (2ry area of Pain) (Bilateral) (L>R) 02/12/2016   Chronic lumbar radicular pain (Right) (S1) 02/12/2016   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 fusion) 02/12/2016   Chronic knee pain (3ry area of  Pain) (Bilateral) (R>L) 02/12/2016   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) 02/12/2016   Insomnia secondary to chronic pain 11/18/2015   Lumbar spondylosis 08/23/2015   Epidural fibrosis 08/23/2015   Spondylolisthesis of lumbar region 08/23/2015   Sleep apnea 08/23/2015   Sleep disturbance 08/23/2015   Lumbar facet syndrome (Bilateral) (L>R) 08/22/2015   Chronic low back pain (1ry area of Pain) (Bilateral) (L>R) 08/22/2015   Encounter for therapeutic drug level monitoring 08/22/2015   Long term current use of opiate analgesic 08/22/2015   Long term prescription opiate use 08/22/2015   Opiate use 08/22/2015   Hypochromic microcytic anemia 04/09/2014   Iron deficiency anemia 04/09/2014   Chronic meniscal tear of knee (Right) 03/01/2014   Contact eczematous dermatitis 03/01/2014   Vitamin B12 deficiency 03/01/2014   Vitamin D deficiency 03/01/2014    Past Medical History:  Diagnosis Date   Allergy    Anemia, iron deficiency 04/09/2014   Arthritis    Atypical chest pain 04/09/2014   CD (contact dermatitis) 03/01/2014   Chest pain 04/09/2014   Chronic pain syndrome    Degenerative disc disease, lumbar    Depression    Diabetes mellitus without complication (HCC)    Lumbar radicular pain 08/22/2015   Lumbar spinal stenosis    Osteoarthritis    Vitamin B12 deficiency    Vitamin D deficiency      Transfusion: None.   Consultants (if any):   Discharged Condition: Improved  Hospital Course: Stephanie [name] is an 62 y.o. female who was admitted 12/07/2022 with a diagnosis of primary osteoarthritis of the right knee and went to the operating room on 12/07/2022 and underwent the above named procedures.    Surgeries: Procedure(s): COMPUTER ASSISTED TOTAL KNEE ARTHROPLASTY on 12/07/2022 Patient  tolerated the surgery well. Taken to PACU where she was stabilized and then transferred to the orthopedic floor.  Started on Lovenox '30mg'$  q 12 hrs. Heels elevated on bed  with rolled towels. No evidence of DVT. Negative [place]. Physical therapy started on day #1 for gait training and transfer. OT started day #1 for ADL and assisted devices.  Patient's IV and hemovac were removed on POD1.  Foley was removed shortly after surgery.  Implants: DePuy Attune size 6N posterior stabilized femoral component (cemented), size 5 rotating platform tibial component (cemented), 38 mm medialized dome patella (cemented), and an 8 mm stabilized rotating platform polyethylene insert.   She was given perioperative antibiotics:  Anti-infectives (From admission, onward)    Start     Dose/Rate Route Frequency Ordered Stop   12/07/22 2315  ceFAZolin (ANCEF) 2-4 GM/100ML-% IVPB       Note to Pharmacy: [place] Cal[facility], [place]: cabinet override      12/07/22 2315 12/08/22 0130   12/07/22 1730  ceFAZolin (ANCEF) IVPB 2g/100 mL premix        2 g 200 mL/hr over 30 Minutes Intravenous Every 6 hours 12/07/22 1603 12/07/22 2346   12/07/22 0845  ceFAZolin (ANCEF) IVPB 2g/100 mL premix        2 g 200 mL/hr over 30 Minutes Intravenous On call to O.R. 12/07/22 9892 12/07/22 1157     .  She was given sequential compression devices, early ambulation, and Lovenox for DVT prophylaxis.  She benefited maximally from the hospital stay and there were no complications.    Recent vital signs:  Vitals:   12/08/22 0015 12/08/22 0758  BP: (!) 110/58 (!) 102/59  Pulse: 78 (!) 59  Resp: 14 16  Temp: 97.9 F (36.6 C) 97.9 F (36.6 C)  SpO2: 97% 98%    Recent laboratory studies:  Lab Results  Component Value Date   HGB 12.5 11/24/2022   HGB 13.0 11/13/2020   HGB 12.8 11/13/2020   Lab Results  Component Value Date   WBC 6.5 11/24/2022   PLT 236 11/24/2022   No results found for: "INR" Lab Results  Component Value Date   NA 138 11/24/2022   K 3.5 11/24/2022   CL 109 11/24/2022   CO2 25 11/24/2022   BUN 14 11/24/2022   CREATININE 0.66 11/24/2022   GLUCOSE 85 11/24/2022     Discharge Medications:   Allergies as of 12/08/2022       Reactions   Phenergan [promethazine Hcl] Other (See Comments)   Restlessness, anxious, "makes me not feel right"         Medication List     TAKE these medications    celecoxib 200 MG capsule Commonly known as: CELEBREX Take 1 capsule (200 mg total) by mouth 2 (two) times daily.   cyclobenzaprine 10 MG tablet Commonly known as: FLEXERIL Take 1 tablet (10 mg total) by mouth daily. What changed:  when to take this reasons to take this   enoxaparin 40 MG/0.4ML injection Commonly known as: LOVENOX Inject 0.4 mLs (40 mg total) into the skin daily.   escitalopram 10 MG tablet Commonly known as: LEXAPRO Take 10 mg by mouth daily.   gabapentin 800 MG tablet Commonly known as: Neurontin Take 1 tablet (800 mg total) by mouth 3 (three) times daily.   HYDROcodone-acetaminophen 10-325 MG tablet Commonly known as: NORCO Take 0.5 tablets by mouth every 12 (twelve) hours as needed for severe pain. Must last 30 days   HYDROcodone-acetaminophen 10-325 MG  tablet Commonly known as: NORCO Take 0.5 tablets by mouth every 12 (twelve) hours as needed for severe pain. Must last 30 days Start taking on: January 05, 2023   HYDROcodone-acetaminophen 10-325 MG tablet Commonly known as: NORCO Take 0.5 tablets by mouth every 12 (twelve) hours as needed for severe pain. Must last 30 days Start taking on: February 04, 2023   ibuprofen 200 MG tablet Commonly known as: ADVIL Take 800-1,000 mg by mouth every 8 (eight) hours as needed for moderate pain.   Melatonin 10 MG Tabs Take 1 tablet by mouth at bedtime as needed.   meloxicam 15 MG tablet Commonly known as: MOBIC Take 1 tablet (15 mg total) by mouth every morning. What changed:  when to take this reasons to take this   naloxone 4 MG/0.1ML Liqd nasal spray kit Commonly known as: NARCAN Place 1 spray into the nose as needed for up to 365 doses (for opioid-induced respiratory  depresssion). In case of emergency (overdose), spray once into each nostril. If no response within 3 minutes, repeat application and call [phone].   ondansetron 4 MG tablet Commonly known as: ZOFRAN Take 1 tablet (4 mg total) by mouth every 6 (six) hours as needed for nausea.   oxyCODONE 5 MG immediate release tablet Commonly known as: Oxy IR/ROXICODONE Take 1-2 tablets (5-10 mg total) by mouth every 4 (four) hours as needed for severe pain.   traMADol 50 MG tablet Commonly known as: ULTRAM Take 1-2 tablets (50-100 mg total) by mouth every 4 (four) hours as needed for moderate pain.   triamcinolone ointment 0.1 % Commonly known as: KENALOG Apply topically 3 (three) times daily. What changed:  how much to take when to take this reasons to take this   Trulicity 3 WL/7.9GX Sopn Generic drug: Dulaglutide Inject 3 mg into the skin once a week. Thursday               Durable Medical Equipment  (From admission, onward)           Start     Ordered   12/07/22 1604  DME Walker rolling  Once       Question:  Patient needs a walker to treat with the following condition  Answer:  Total knee replacement status   12/07/22 1603   12/07/22 1604  DME Bedside commode  Once       Comments: Patient is not able to walk the distance required to go the bathroom, or he/she is unable to safely negotiate stairs required to access the bathroom.  A 3in1 BSC will alleviate this problem  Question:  Patient needs a bedside commode to treat with the following condition  Answer:  Total knee replacement status   12/07/22 1603           Diagnostic Studies: DG Knee Right Port  Result Date: 12/07/2022 CLINICAL DATA:  232140 Total knee replacement status 232140 EXAM: PORTABLE RIGHT KNEE - 1-2 VIEW COMPARISON:  12/21/2017 FINDINGS: Interval postsurgical changes from right total knee arthroplasty. Arthroplasty components are in their expected alignment. No periprosthetic fracture or evidence of other  complication. Expected postoperative changes within the overlying soft tissues. IMPRESSION: Status post right total knee arthroplasty without evidence of complication. Electronically Signed   By: Davina Poke D.O.   On: 12/07/2022 14:59    Disposition: Plan for discharge home this afternoon pending progress with PT.   Follow-up Information     Watt Climes, PA Follow up on 12/21/2022.   Specialty:  Physician Assistant Why: at 10:15am Contact information: [place] [address] [phone]         Dereck Leep, MD Follow up on 01/19/2023.   Specialty: Orthopedic Surgery Why: at 2:30pm Contact information: [place] [address] [phone]                Signed: Judson Roch PA-C 12/08/2022, 1:43 PM

## 2022-12-08 NOTE — Progress Notes (Signed)
Patient is not able to walk the distance required to go the bathroom, or he/she is unable to safely negotiate stairs required to access the bathroom.  A 3in1 BSC will alleviate this problem  Stephanie [name] P. Noeli Lavery, Jr. M.D.  

## 2022-12-08 NOTE — Evaluation (Signed)
Physical Therapy Evaluation Patient Details Name: Stephanie [name] MRN: [id number] DOB: 06/03/1961 Today's Date: 12/08/2022  History of Present Illness  Pt is a 62 y.o. 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  PMH includes DM, anemia, chronic pain syndrome, DDD, lumbar radicular pain, vitamin B12 and D deficiency, L-spine sx.  Clinical Impression  Prior to surgery, pt was independent with functional mobility; lives with her fiance in 1 level home with 3 STE (no railing).  5/10 R knee pain beginning/end of session at rest.  Able to perform R LE SLR independently and R knee flexion AROM to 90 degrees.  Currently pt is modified independent with bed mobility; CGA progressing to SBA with transfers using RW; CGA ambulating 100 feet with RW use; and CGA to navigate stairs using RW.  Pt's fiance able to safely assist pt with stairs navigation.  Reviewed home safety and safe car transfer technique: pt and pt's fiance appearing with appropriate understanding.  Plan for 1 more PT session prior to discharge home today (discussed with pt's nurse).  Pt's nurse reports already notifying TOC that pt needs RW and 3 in 1.  Pt would benefit from skilled PT to address noted impairments and functional limitations (see below for any additional details).  Upon hospital discharge, pt would benefit from [facility].    Recommendations for follow up therapy are one component of a multi-disciplinary discharge planning process, led by the attending physician.  Recommendations may be updated based on patient status, additional functional criteria and insurance authorization.  Follow Up Recommendations Home health PT      Assistance Recommended at Discharge Intermittent Supervision/Assistance  Patient can return home with the following  A little help with walking and/or transfers;A little help with bathing/dressing/bathroom;Assistance with cooking/housework;Assist for transportation;Help with stairs or ramp for  entrance    Equipment Recommendations Rolling walker (2 wheels);BSC/3in1  Recommendations for Other Services       Functional Status Assessment Patient has had a recent decline in their functional status and demonstrates the ability to make significant improvements in function in a reasonable and predictable amount of time.     Precautions / Restrictions Precautions Precautions: Fall;Knee Precaution Booklet Issued: Yes (comment) Precaution Comments: KI if unable to SLR independently Restrictions Weight Bearing Restrictions: Yes RLE Weight Bearing: Weight bearing as tolerated      Mobility  Bed Mobility Overal bed mobility: Modified Independent             General bed mobility comments: Semi-supine to/from sitting with mild increased effort for R LE management    Transfers Overall transfer level: Needs assistance Equipment used: Rolling walker (2 wheels) Transfers: Sit to/from Stand Sit to Stand: Min guard, Supervision           General transfer comment: initial vc's for UE/LE placement and overall technique (progressed from CGA to SBA)    Ambulation/Gait Ambulation/Gait assistance: Min guard Gait Distance (Feet): 100 Feet Assistive device: Rolling walker (2 wheels)   Gait velocity: decreased     General Gait Details: antalgic; decreased stance time R LE; step to gait pattern; vc's for walker use and LE sequencing  Stairs Stairs: Yes Stairs assistance: Min guard Stair Management: Step to pattern, Backwards, Forwards, With walker Number of Stairs:  (4 steps x2 trials) General stair comments: ascended 4 steps backwards with RW and descended 4 steps forwards with RW; initial vc's for technique/LE sequencing/walker placement; pt's significant other assisted pt with stairs and demonstrating appropriate safety and cueing to assist  pt  Wheelchair Mobility    Modified Rankin (Stroke Patients Only)       Balance Overall balance assessment: Needs  assistance Sitting-balance support: No upper extremity supported, Feet supported Sitting balance-Leahy Scale: Normal Sitting balance - Comments: steady sitting reaching outside BOS   Standing balance support: Bilateral upper extremity supported, During functional activity Standing balance-Leahy Scale: Good Standing balance comment: steady ambulating with RW use                             Pertinent Vitals/Pain Pain Assessment Pain Assessment: 0-10 Pain Score: 5  Pain Location: R knee Pain Descriptors / Indicators: Aching, Tender, Sore Pain Intervention(s): Limited activity within patient's tolerance, Monitored during session, Premedicated before session, Repositioned, Other (comment) (polar care applied) Vitals (HR and O2 on room air) stable and WFL throughout treatment session.    Home Living Family/patient expects to be discharged to:: Private residence Living Arrangements: Spouse/significant other (Pt's fiance) Available Help at Discharge: Available 24 hours/day (Fiance) Type of Home: House Home Access: Stairs to enter Entrance Stairs-Rails: None Entrance Stairs-Number of Steps: 3   Home Layout: One level Home Equipment: Grab bars - tub/shower;Shower seat - built in;Other (comment) Additional Comments: Hand held shower head    Prior Function Prior Level of Function : Independent/Modified Independent             Mobility Comments: No recent falls.       Hand Dominance        Extremity/Trunk Assessment   Upper Extremity Assessment Upper Extremity Assessment: Overall WFL for tasks assessed    Lower Extremity Assessment Lower Extremity Assessment: LLE deficits/detail;RLE deficits/detail RLE Deficits / Details: able to perform R LE SLR independently; at least 3/5 AROM hip flexion and ankle DF/PF RLE: Unable to fully assess due to pain LLE Deficits / Details: L knee flexion limited (at least 100 degrees)--pt reports needing L knee surgery as well     Cervical / Trunk Assessment Cervical / Trunk Assessment: Normal  Communication   Communication: No difficulties  Cognition Arousal/Alertness: Awake/alert Behavior During Therapy: WFL for tasks assessed/performed Overall Cognitive Status: Within Functional Limits for tasks assessed                                          General Comments  Nursing cleared pt for participation in physical therapy.  Pt agreeable to PT session.    Exercises Total Joint Exercises Ankle Circles/Pumps: AROM, Strengthening, Both, 10 reps, Supine Quad Sets: AROM, Strengthening, Both, 10 reps, Supine Short Arc Quad: AAROM, Strengthening, Right, 10 reps, Supine Heel Slides: AAROM, Strengthening, Right, 10 reps, Supine Hip ABduction/ADduction: AAROM, Strengthening, Right, 10 reps, Supine Straight Leg Raises: AROM, Strengthening, Right, 10 reps, Supine Long Arc Quad: AROM, Strengthening, Right, 10 reps, Seated Knee Flexion: AROM, Strengthening, Right, 10 reps, Seated Goniometric ROM: R knee AROM 5-90 degrees   Assessment/Plan    PT Assessment Patient needs continued PT services  PT Problem List Decreased strength;Decreased range of motion;Decreased activity tolerance;Decreased balance;Decreased mobility;Decreased knowledge of use of DME;Decreased knowledge of precautions;Decreased skin integrity;Pain       PT Treatment Interventions DME instruction;Gait training;Stair training;Functional mobility training;Therapeutic activities;Therapeutic exercise;Balance training;Patient/family education    PT Goals (Current goals can be found in the Care Plan section)  Acute Rehab PT Goals Patient Stated Goal: to improve mobility and go home  today PT Goal Formulation: With patient Time For Goal Achievement: 12/22/22 Potential to Achieve Goals: Good    Frequency BID     Co-evaluation               AM-PAC PT "6 Clicks" Mobility  Outcome Measure Help needed turning from your back to your  side while in a flat bed without using bedrails?: None Help needed moving from lying on your back to sitting on the side of a flat bed without using bedrails?: None Help needed moving to and from a bed to a chair (including a wheelchair)?: A Little Help needed standing up from a chair using your arms (e.g., wheelchair or bedside chair)?: A Little Help needed to walk in hospital room?: A Little Help needed climbing 3-5 steps with a railing? : A Little 6 Click Score: 20    End of Session Equipment Utilized During Treatment: Gait belt Activity Tolerance: Patient tolerated treatment well;No increased pain Patient left: in chair;with call bell/phone within reach;with nursing/sitter in room;with family/visitor present;with SCD's reapplied;Other (comment) (polar care in place; R heel floating via towel roll) Nurse Communication: Mobility status;Precautions;Weight bearing status;Other (comment) (pt's pain status) PT Visit Diagnosis: Other abnormalities of gait and mobility (R26.89);Muscle weakness (generalized) (M62.81);Pain Pain - Right/Left: Right Pain - part of body: Knee    Time: 0910-1005 PT Time Calculation (min) (ACUTE ONLY): 55 min   Charges:   PT Evaluation $PT Eval Low Complexity: 1 Low PT Treatments $Gait Training: 8-22 mins $Therapeutic Exercise: 8-22 mins       Leitha Bleak, PT 12/08/22, 10:41 AM

## 2022-12-08 NOTE — Progress Notes (Signed)
Physical Therapy Treatment Patient Details Name: Stephanie [name] MRN: [id number] DOB: [birth year] Today's Date: 12/08/2022   History of Present Illness Pt is a 62 y.o. 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  PMH includes DM, anemia, chronic pain syndrome, DDD, lumbar radicular pain, vitamin B12 and D deficiency, L-spine sx.    PT Comments    Pt resting in recliner upon PT arrival; agreeable to therapy.  During session pt SBA with transfers and ambulation 200 feet with RW use (vc's to progress from step to, to step through gait pattern).  4/10 R knee pain during sessions activities.  Pt and pt's fiance report no questions or concerns for home discharge today.  MD and pt's nurse notified pt ready to discharge home today from PT standpoint.    Recommendations for follow up therapy are one component of a multi-disciplinary discharge planning process, led by the attending physician.  Recommendations may be updated based on patient status, additional functional criteria and insurance authorization.  Follow Up Recommendations  Home health PT     Assistance Recommended at Discharge Intermittent Supervision/Assistance  Patient can return home with the following A little help with walking and/or transfers;A little help with bathing/dressing/bathroom;Assistance with cooking/housework;Assist for transportation;Help with stairs or ramp for entrance   Equipment Recommendations  Rolling walker (2 wheels);BSC/3in1 (pt's husband reports he already put them in his car)    Recommendations for Other Services       Precautions / Restrictions Precautions Precautions: Fall;Knee Precaution Booklet Issued: Yes (comment) Precaution Comments: KI if unable to SLR independently Restrictions Weight Bearing Restrictions: Yes RLE Weight Bearing: Weight bearing as tolerated     Mobility  Bed Mobility             General bed mobility comments: Deferred (pt in recliner  beginning/end of session)    Transfers Overall transfer level: Needs assistance Equipment used: Rolling walker (2 wheels) Transfers: Sit to/from Stand Sit to Stand: Supervision           General transfer comment: steady transfer with RW use    Ambulation/Gait Ambulation/Gait assistance: Supervision Gait Distance (Feet): 200 Feet Assistive device: Rolling walker (2 wheels)   Gait velocity: decreased     General Gait Details: mild decreased stance time R LE; step to progressing to step through gait pattern; steady with RW use   Stairs Pt reports feeling comfortable with stairs training this morning so deferred this session.   Wheelchair Mobility    Modified Rankin (Stroke Patients Only)       Balance Overall balance assessment: Needs assistance Sitting-balance support: No upper extremity supported, Feet supported Sitting balance-Leahy Scale: Normal Sitting balance - Comments: steady sitting reaching outside BOS   Standing balance support: Bilateral upper extremity supported, During functional activity Standing balance-Leahy Scale: Good Standing balance comment: steady ambulating with RW use                            Cognition Arousal/Alertness: Awake/alert Behavior During Therapy: WFL for tasks assessed/performed Overall Cognitive Status: Within Functional Limits for tasks assessed                                          Exercises Total Joint Exercises Long Arc Quad: AROM, Strengthening, Right, 10 reps, Seated Goniometric ROM: R knee AROM 5-90 degrees (from morning session)  General Comments        Pertinent Vitals/Pain Pain Assessment Pain Assessment: 0-10 Pain Score: 4  Pain Location: R knee Pain Descriptors / Indicators: Aching, Discomfort Pain Intervention(s): Limited activity within patient's tolerance, Monitored during session, Premedicated before session, Repositioned, Other (comment) (polar care  applied) Vitals (HR and O2 on room air) stable and WFL throughout treatment session.    Home Living Family/patient expects to be discharged to:: Private residence Living Arrangements: Spouse/significant other (Pt's fiance) Available Help at Discharge: Available 24 hours/day Type of Home: House Home Access: Stairs to enter Entrance Stairs-Rails: None Entrance Stairs-Number of Steps: 3   Home Layout: One level Home Equipment: Grab bars - tub/shower;Shower seat - built in;Other (comment) Additional Comments: Hand held shower head    Prior Function            PT Goals (current goals can now be found in the care plan section) Acute Rehab PT Goals Patient Stated Goal: to improve mobility and go home today PT Goal Formulation: With patient Time For Goal Achievement: 12/22/22 Potential to Achieve Goals: Good Progress towards PT goals: Progressing toward goals    Frequency    BID      PT Plan Current plan remains appropriate    Co-evaluation              AM-PAC PT "6 Clicks" Mobility   Outcome Measure  Help needed turning from your back to your side while in a flat bed without using bedrails?: None Help needed moving from lying on your back to sitting on the side of a flat bed without using bedrails?: None Help needed moving to and from a bed to a chair (including a wheelchair)?: A Little Help needed standing up from a chair using your arms (e.g., wheelchair or bedside chair)?: A Little Help needed to walk in hospital room?: A Little Help needed climbing 3-5 steps with a railing? : A Little 6 Click Score: 20    End of Session Equipment Utilized During Treatment: Gait belt Activity Tolerance: Patient tolerated treatment well;No increased pain Patient left: in chair;with call bell/phone within reach;with family/visitor present;with SCD's reapplied;Other (comment) (polar care in place) Nurse Communication: Mobility status;Precautions;Weight bearing status;Other  (comment) (pt good to discharge home from PT standpoint) PT Visit Diagnosis: Other abnormalities of gait and mobility (R26.89);Muscle weakness (generalized) (M62.81);Pain Pain - Right/Left: Right Pain - part of body: Knee     Time: 9741-6384 PT Time Calculation (min) (ACUTE ONLY): 17 min  Charges:  $Gait Training: 8-22 mins                    Leitha Bleak, PT 12/08/22, 1:55 PM

## 2022-12-09 ENCOUNTER — Telehealth: Payer: Self-pay | Admitting: Pain Medicine

## 2022-12-09 NOTE — Telephone Encounter (Signed)
Set up a virtual appointment to discuss changes in medication dosing.

## 2022-12-09 NOTE — Telephone Encounter (Signed)
PT stated that

## 2022-12-09 NOTE — Telephone Encounter (Signed)
PT stated that she wanted to see why was her dose changed on her hydrocodone 10-325. Please give patient a call. TY

## 2022-12-10 ENCOUNTER — Telehealth: Payer: Self-pay

## 2022-12-10 NOTE — Telephone Encounter (Signed)
Patient states that her dose was cut from 1 per day to 0.5 per day.  Informed her that her note states that due to her pill count she was not taking the full amount and that is why the dose changed.  Patient states that is not correct. States she is taking 1 per day but the pharmacy was out of medication and she had to wait and got it late and that is why she had extra when they were counted. She wanted to discuss this with Dr Dossie Arbour and she was set up a virtual visit.

## 2022-12-11 NOTE — Progress Notes (Unsigned)
Unsuccessful attempt to contact patient for Virtual Visit (Pain Management Telehealth)   Patient provided contact information:  [phone] (home); [phone] (mobile); (Preferred) [phone] [email or web address]   Pre-screening:  Our staff was successful in contacting Ms. Norrick using the above provided information.   I unsuccessfully attempted to make contact with [name] on 2 different occasions on 12/14/2022 via telephone. I was unable to complete the virtual encounter due to call going directly to voicemail. I was able to leave a message where I clearly identify myself as Gaspar Cola, MD and I left a message to call us back to reschedule the call.  Pharmacotherapy Assessment  Analgesic: Hydrocodone/acetaminophen 10/325 mg, 0.5 tab PO BID (10 mg/day of hydrocodone) MME/day: 10 mg/day.   Follow-up plan:   Reschedule Visit.    Interventional Therapies  Risk  Complexity Considerations:   Estimated body mass index is 27.25 kg/m as calculated from the following:   Height as of this encounter: 5' 7"$  (1.702 m).   Weight as of this encounter: 174 lb (78.9 kg). WNL   Planned  Pending:   Diagnostic right caudal ESI #2    Under consideration:   Diagnostic bilateral lumbar facet MBB Diagnostic Caudal ESI Diagnostic Bilateral L4-5 & L5-S1Lumbar TFESI Diagnostic bilateral sacroiliac joint Blk Diagnostic bilateral hip joint injection Diagnostic bilateral genicular NB Possible Racz procedure    Completed:   Palliative Caudal ESI x1 (02/01/2017) (100/100/80/80)   Completed by other providers:   Therapeutic right knee IA steroid inj. x6 (05/14/2020, 07/22/2020, 08/12/2020, 09/15/2021, 12/29/2021, 04/07/2022) by Ellard Artis, PA-C  Therapeutic left knee IA steroid inj. x4 (04/29/2020, 07/22/2020, 08/12/2020, 09/29/2021) by Ellard Artis, PA-C    Therapeutic  Palliative (PRN) options:   Palliative Caudal ESI #2    Pharmacotherapy  Nonopioids transferred 10/07/2020:  Mobic, Neurontin, Flexeril, melatonin, and triamcinolone ointment.       Recent Visits Date Type Provider Dept  11/09/22 Office Visit Milinda Pointer, MD [facility]  Showing recent visits within past 90 days and meeting all other requirements Today's Visits Date Type Provider Dept  12/14/22 Office Visit Milinda Pointer, MD [facility]  Showing today's visits and meeting all other requirements Future Appointments Date Type Provider Dept  03/01/23 Appointment Milinda Pointer, MD [facility]  Showing future appointments within next 90 days and meeting all other requirements   Note by: Gaspar Cola, MD Date: 12/14/2022; Time: 3:09 PM

## 2022-12-14 ENCOUNTER — Ambulatory Visit: Payer: Medicare Other | Attending: Pain Medicine | Admitting: Pain Medicine

## 2022-12-14 DIAGNOSIS — Z91199 Patient's noncompliance with other medical treatment and regimen due to unspecified reason: Secondary | ICD-10-CM

## 2022-12-14 NOTE — Patient Instructions (Signed)
____________________________________________________________________________________________  Drug Holidays  What is a "Drug Holiday"? Drug Holiday: is the name given to the process of slowly tapering down and temporarily stopping the pain medication for the purpose of decreasing or eliminating tolerance to the drug.  Benefits Improved effectiveness Decreased required effective dose Improved pain control End dependence on high dose therapy Decrease cost of therapy Uncovering "opioid-induced hyperalgesia". (OIH)  What is "opioid hyperalgesia"? It is a paradoxical increase in pain caused by exposure to opioids. Stopping the opioid pain medication, contrary to the expected, it actually decreases or completely eliminates the pain. Ref.: "A comprehensive review of opioid-induced hyperalgesia". Brion Aliment, et.al. Pain Physician. 2011 Mar-Apr;14(2):145-61.  What is tolerance? Tolerance: the progressive loss of effectiveness of a pain medicine due to repetitive use. A common problem of opioid pain medications.  How long should a "Drug Holiday" last? Effectiveness depends on the patient staying off all opioid pain medicines for a minimum of 14 consecutive days. (2 weeks)  How about just taking less of the medicine? Does not work. Will not accomplish goal of eliminating the excess receptors.  How about switching to a different pain medicine? (AKA. "Opioid rotation") Does not work. Creates the illusion of effectiveness by taking advantage of inaccurate equivalent dose calculations between different opioids. -This "technique" was promoted by studies funded by [employer], such as [employer], creators of "OxyContin".  Can I stop the medicine "cold [place]"? Depends. You should always coordinate with your Pain Specialist to make the transition as smoothly as possible. Avoid stopping the medicine abruptly without consulting. We recommend a "slow taper".  What is a slow  taper? Taper: refers to the gradual decrease in dose.   How do I stop/taper the dose? Slowly. Decrease the daily amount of pills that you take by one (1) pill every seven (7) days. This is called a "slow downward taper". Example: if you normally take four (4) pills per day, drop it to three (3) pills per day for seven (7) days, then to two (2) pills per day for seven (7) days, then to one (1) per day for seven (7) days, and then stop the medicine. The 14 day "Drug Holiday" starts on the first day without medicine.   Will I experience withdrawals? Unlikely with a slow taper.  What triggers withdrawals? Withdrawals are triggered by the sudden/abrupt stop of high dose opioids. Withdrawals can be avoided by slowly decreasing the dose over a prolonged period of time.  What are withdrawals? Symptoms associated with sudden/abrupt reduction/stopping of high-dose, long-term use of pain medication. Withdrawal are seldom seen on low dose therapy, or patients rarely taking opioid medication.  Early Withdrawal Symptoms may include: Agitation Anxiety Muscle aches Increased tearing Insomnia Runny nose Sweating Yawning  Late symptoms may include: Abdominal cramping Diarrhea Dilated pupils Goose bumps Nausea Vomiting  (Last update: 10/11/2022) ____________________________________________________________________________________________   ____________________________________________________________________________________________  Patient Information update  To: All of our patients.  Re: Name change.  It has been made official that our current name, "[facility]"   will soon be changed to "[facility]".   The purpose of this change is to eliminate any confusion created by the concept of our practice being a "Medication Management Pain [facility]". In the past this has led to the  misconception that we treat pain primarily by the use of prescription medications.  Nothing can be farther from the truth.   Understanding PAIN MANAGEMENT: To further understand what  our practice does, you first have to understand that "Pain Management" is a subspecialty that requires additional training once a physician has completed their specialty training, which can be in either Anesthesia, Neurology, Psychiatry, or Physical Medicine and Rehabilitation (PMR). Each one of these contributes to the final approach taken by each physician to the management of their patient's pain. To be a "Pain Management Specialist" you must have first completed one of the specialty trainings below.  Anesthesiologists - trained in clinical pharmacology and interventional techniques such as nerve blockade and regional as well as central neuroanatomy. They are trained to block pain before, during, and after surgical interventions.  Neurologists - trained in the diagnosis and pharmacological treatment of complex neurological conditions, such as Multiple Sclerosis, Parkinson's, spinal cord injuries, and other systemic conditions that may be associated with symptoms that may include but are not limited to pain. They tend to rely primarily on the treatment of chronic pain using prescription medications.  Psychiatrist - trained in conditions affecting the psychosocial wellbeing of patients including but not limited to depression, anxiety, schizophrenia, personality disorders, addiction, and other substance use disorders that may be associated with chronic pain. They tend to rely primarily on the treatment of chronic pain using prescription medications.   Physical Medicine and Rehabilitation (PMR) physicians, also known as physiatrists - trained to treat a wide variety of medical conditions affecting the brain, spinal cord, nerves, bones, joints, ligaments, muscles, and tendons. Their training is primarily aimed at treating  patients that have suffered injuries that have caused severe physical impairment. Their training is primarily aimed at the physical therapy and rehabilitation of those patients. They may also work alongside orthopedic surgeons or neurosurgeons using their expertise in assisting surgical patients to recover after their surgeries.  INTERVENTIONAL PAIN MANAGEMENT is sub-subspecialty of Pain Management.  Our physicians are Board-certified in Anesthesia, Pain Management, and Interventional Pain Management.  This meaning that not only have they been trained and Board-certified in their specialty of Anesthesia, and subspecialty of Pain Management, but they have also received further training in the sub-subspecialty of Interventional Pain Management, in order to become Board-certified as INTERVENTIONAL PAIN MANAGEMENT SPECIALIST.    Mission: Our goal is to use our skills in  [place] as alternatives to the chronic use of prescription opioid medications for the treatment of pain. To make this more clear, we have changed our name to reflect what we do and offer. We will continue to offer medication management assessment and recommendations, but we will not be taking over any patient's medication management.  ____________________________________________________________________________________________

## 2022-12-16 ENCOUNTER — Ambulatory Visit: Payer: No Typology Code available for payment source | Attending: Pain Medicine | Admitting: Pain Medicine

## 2022-12-16 DIAGNOSIS — Z79891 Long term (current) use of opiate analgesic: Secondary | ICD-10-CM

## 2022-12-16 DIAGNOSIS — M25562 Pain in left knee: Secondary | ICD-10-CM

## 2022-12-16 DIAGNOSIS — Z96651 Presence of right artificial knee joint: Secondary | ICD-10-CM | POA: Diagnosis not present

## 2022-12-16 DIAGNOSIS — Z79899 Other long term (current) drug therapy: Secondary | ICD-10-CM

## 2022-12-16 DIAGNOSIS — G8918 Other acute postprocedural pain: Secondary | ICD-10-CM | POA: Diagnosis not present

## 2022-12-16 DIAGNOSIS — G8929 Other chronic pain: Secondary | ICD-10-CM

## 2022-12-16 NOTE — Progress Notes (Signed)
Patient: Stephanie [name]  Service Category: E/M  Provider: Gaspar Cola, MD  DOB: [birth year]  DOS: 12/16/2022  Location: Office  MRN: [id number]  Setting: Ambulatory outpatient  Referring Provider: Latanya Maudlin, NP  Type: Established Patient  Specialty: Interventional Pain Management  PCP: Latanya Maudlin, NP  Location: Remote location  Delivery: TeleHealth     Virtual Encounter - Pain Management PROVIDER NOTE: Information contained herein reflects review and annotations entered in association with encounter. Interpretation of such information and data should be left to medically-trained personnel. Information provided to patient can be located elsewhere in the medical record under "Patient Instructions". Document created using STT-dictation technology, any transcriptional errors that may result from process are unintentional.    Contact & Pharmacy Preferred: [phone] Home: [phone] (home) Mobile: [phone] (mobile) E-mail: [email or web address]  [id number] [place] [address]Phone: [phone]: [phone]  Pre-screening  [name] "in-person" vs "virtual" encounter. She indicated preferring virtual for this encounter.   Reason COVID-19*  Social distancing based on CDC and AMA recommendations.   I contacted Stephanie [name] 12/16/2022 via telephone.      I clearly identified myself as FGaspar Cola MD. I verified that I was speaking with the correct person using two identifiers (Name: LKoni [name] and date of birth: 805/19/1962.  Consent I sought verbal advanced consent from Stephanie Boastfor virtual visit interactions. I informed Ms. SCurrieof possible security and privacy concerns, risks, and limitations associated with providing "not-in-person" medical evaluation and management services. I also informed Ms. SSweezeyof the availability of "in-person"  appointments. Finally, I informed her that there would be a charge for the virtual visit and that she could be  personally, fully or partially, financially responsible for it. Ms. SDominquezexpressed understanding and agreed to proceed.   Historic Elements   [name] a 62y.o. year old, female patient evaluated today after our last contact on 12/14/2022. Stephanie [name] has a past medical history of Allergy, Anemia, iron deficiency (04/09/2014), Arthritis, Atypical chest pain (04/09/2014), CD (contact dermatitis) (03/01/2014), Chest pain (04/09/2014), Chronic pain syndrome, Degenerative disc disease, lumbar, Depression, Diabetes mellitus without complication ([place], Lumbar radicular pain (08/22/2015), Lumbar spinal stenosis, Osteoarthritis, Vitamin B12 deficiency, and Vitamin D deficiency. She also  has a past surgical history that includes Lumbar spine surgery (2010); Appendectomy (2015); Cholecystectomy (2014); Cesarean section (1990); Bunionectomy (Bilateral, 2014); Cesarean section (1991); Knee arthroscopy (Right, 2014); and Knee Arthroplasty (Right, 12/07/2022). [name] a current medication list which includes the following prescription(s): celecoxib, cyclobenzaprine, trulicity, enoxaparin, escitalopram, gabapentin, hydrocodone-acetaminophen, [START ON 01/05/2023] hydrocodone-acetaminophen, [START ON 02/04/2023] hydrocodone-acetaminophen, ibuprofen, melatonin, meloxicam, naloxone, ondansetron, oxycodone, tramadol, and triamcinolone ointment. She  reports that she has never smoked. She has never used smokeless tobacco. She reports that she does not drink alcohol and does not use drugs. [name] allergic to phenergan [promethazine hcl].  Estimated body mass index is 25.06 kg/m as calculated from the following:   Height as of 12/07/22: 5' 7"$  (1.702 m).   Weight as of 12/07/22: 160 lb (72.6 kg).  HPI  Today, she is being contacted for medication management.  The patient requested today's  appointment to discuss a change in "medication dosage".  According to the medical record, the patient had a total knee arthroplasty done by Dr. HMarry Guanon 12/07/2022 (9 days ago).  On 12/08/2022 the patient failed a  prescription for tramadol 50 mg tablets (#30) and a prescription for oxycodone IR 5 mg tablets (#30), both written by Stephanie Corns, PA-C for postop pain after her knee replacement.  On 11/09/2022 she notified us about the upcoming surgery with Stephanie [name] and she was provided with the surgeons handout on postoperative management of the pain.  According to most sources the acute postop pain from knee replacement surgery should last approximately 5 to 6 weeks.  Because this is acute pain from a surgery, this falls under the category of acute pain management which is not a service provided by this practice.  On the patient's last visit, I went down on the amount of medication that I am providing her from 45 pills/month to 30.  This was based on calculations made by the patient's use of medications as well as the fact that she had an upcoming surgery and she would be receiving additional pain medication for the acute postop pain.  Today the patient tells me that the reason why it might have appeared that she was using less medications was because she had a partial fill at the pharmacy that was later completed and because of that the pill count ended up being higher than expected.  Actually, this was not the reason since she probably does not know that the PMP records partial prescription fills and if that had happened, there would be a record of that.  The fact of the matter is that open reviewing the PMP on 11/09/2022 I found out that 2 of the 3 prescriptions written on 07/27/2022 had been filled, with the last 1 having been reported as filled on 11/06/2022.  Because the third of the 3 prescription had not been filled by the time of the medication management appointment on 11/09/2022, I called the  pharmacy and canceled that third prescription.  I then went back several months and looked at when the prescriptions had been written and when the patient had them filled and calculated based on pills per day that she was not really using 45 pills/month.  Based on that calculation, the total amount of monthly pills was adjusted so as to prevent having excess medication accumulate.  In reviewing the patient's PMP today, there is no evidence of a "partial fill" dating back as far as 01/08/2021.  Today I have reminded the patient that our goal is not to have her on these medications for the rest of her life.  As she improves from the benefit obtained from the knee replacements, we will be further tapering down the opioid until we can completely stop it.  Today I have also reminded the patient that for the next 5 to 6 weeks, any pain in the knees, especially on the right side, she will need to contact her surgeon for the management of that acute pain.  Pharmacotherapy Assessment   Opioid Analgesic: Hydrocodone/acetaminophen 10/325 mg, 0.5 tab PO BID (10 mg/day of hydrocodone) MME/day: 10 mg/day.   Monitoring: [place] PMP: PDMP reviewed during this encounter.       Pharmacotherapy: No side-effects or adverse reactions reported. Compliance: No problems identified. Effectiveness: Clinically acceptable. Plan: Refer to "POC". UDS:  Summary  Date Value Ref Range Status  04/27/2022 Note  Final    Comment:    ==================================================================== [employer] Select 13 (MW) ==================================================================== Test                             Result  Flag       Units  Drug Present and Declared for Prescription Verification   Hydrocodone                    691          EXPECTED   ng/mg creat   Hydromorphone                  206          EXPECTED   ng/mg creat   Norhydrocodone                 2196         EXPECTED   ng/mg creat    Sources of  hydrocodone include scheduled prescription medications.    Hydromorphone and norhydrocodone are expected metabolites of    hydrocodone. Hydromorphone is also available as a scheduled    prescription medication.  ==================================================================== Test                      Result    Flag   Units      Ref Range   Creatinine              89               mg/dL      >=20 ==================================================================== Declared Medications:  The flagging and interpretation on this report are based on the  following declared medications.  Unexpected results may arise from  inaccuracies in the declared medications.   **Note: The testing scope of this panel includes these medications:   Hydrocodone (Norco)   **Note: The testing scope of this panel does not include the  following reported medications:   Acetaminophen (Norco)  Cholecalciferol  Cyanocobalamin  Cyclobenzaprine (Flexeril)  Dulaglutide (Trulicity)  Escitalopram (Lexapro)  Gabapentin (Neurontin)  Ibuprofen (Advil)  Melatonin  Meloxicam (Mobic)  Metformin  Triamcinolone (Kenalog) ==================================================================== For clinical consultation, please call [phone]. ====================================================================    No results found for: "CBDTHCR", "D8THCCBX", "D9THCCBX"   Laboratory Chemistry Profile   Renal Lab Results  Component Value Date   BUN 14 11/24/2022   CREATININE 0.66 11/24/2022   GFRAA >60 09/01/2019   GFRNONAA >60 11/24/2022    Hepatic Lab Results  Component Value Date   AST 16 11/24/2022   ALT 11 11/24/2022   ALBUMIN 4.1 11/24/2022   ALKPHOS 48 11/24/2022    Electrolytes Lab Results  Component Value Date   NA 138 11/24/2022   K 3.5 11/24/2022   CL 109 11/24/2022   CALCIUM 8.7 (L) 11/24/2022   MG 1.9 09/01/2019    Bone Lab Results  Component Value Date   VD25OH 33.18  09/01/2019   25OHVITD1 34 05/07/2016   25OHVITD2 <1.0 05/07/2016   25OHVITD3 34 05/07/2016    Inflammation (CRP: Acute Phase) (ESR: Chronic Phase) Lab Results  Component Value Date   CRP <0.5 11/24/2022   ESRSEDRATE 7 11/24/2022         Note: Above Lab results reviewed.  Imaging  DG Knee Right Port CLINICAL DATA:  [id number] Total knee replacement status [id number]  EXAM: PORTABLE RIGHT KNEE - 1-2 VIEW  COMPARISON:  12/21/2017  FINDINGS: Interval postsurgical changes from right total knee arthroplasty. Arthroplasty components are in their expected alignment. No periprosthetic fracture or evidence of other complication. Expected postoperative changes within the overlying soft tissues.  IMPRESSION: Status post right total knee arthroplasty without evidence of complication.  Electronically Signed   By:  Nicholas  Plundo D.O.   On: 12/07/2022 14:59  Assessment  The primary encounter diagnosis was Acute postoperative pain. 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  Plan of Care  Problem-specific:  No problem-specific Assessment & Plan notes found for this encounter.  Stephanie [name] has a current medication list which includes the following long-term medication(s): cyclobenzaprine, enoxaparin, escitalopram, gabapentin, hydrocodone-acetaminophen, [START ON 01/05/2023] hydrocodone-acetaminophen, [START ON 02/04/2023] hydrocodone-acetaminophen, meloxicam, and triamcinolone ointment.  Pharmacotherapy (Medications Ordered): No orders of the defined types were placed in this encounter.  Orders:  No orders of the defined types were placed in this encounter.  Follow-up plan:   No follow-ups on file.      Interventional Therapies  Risk  Complexity Considerations:   Estimated body mass  index is 27.25 kg/m as calculated from the following:   Height as of this encounter: 5' 7"$  (1.702 m).   Weight as of this encounter: 174 lb (78.9 kg). WNL   Planned  Pending:   Diagnostic right caudal ESI #2    Under consideration:   Diagnostic bilateral lumbar facet MBB Diagnostic Caudal ESI Diagnostic Bilateral L4-5 & L5-S1Lumbar TFESI Diagnostic bilateral sacroiliac joint Blk Diagnostic bilateral hip joint injection Diagnostic bilateral genicular NB Possible Racz procedure    Completed:   Palliative Caudal ESI x1 (02/01/2017) (100/100/80/80)   Completed by other providers:   Surgery: Right total knee replacement by Dr. Skip Estimable (12/07/2022)  Therapeutic right knee IA steroid inj. x6 (05/14/2020, 07/22/2020, 08/12/2020, 09/15/2021, 12/29/2021, 04/07/2022) by Ellard Artis, PA-C  Therapeutic left knee IA steroid inj. x4 (04/29/2020, 07/22/2020, 08/12/2020, 09/29/2021) by Ellard Artis, PA-C    Therapeutic  Palliative (PRN) options:   Palliative Caudal ESI #2    Pharmacotherapy  Nonopioids transferred 10/07/2020: Mobic, Neurontin, Flexeril, melatonin, and triamcinolone ointment.         Recent Visits Date Type Provider Dept  12/14/22 Office Visit Milinda Pointer, MD [facility]  11/09/22 Office Visit Milinda Pointer, MD [facility]  Showing recent visits within past 90 days and meeting all other requirements Today's Visits Date Type Provider Dept  12/16/22 Office Visit Milinda Pointer, MD [facility]  Showing today's visits and meeting all other requirements Future Appointments Date Type Provider Dept  03/01/23 Appointment Milinda Pointer, MD [facility]  Showing future appointments within next 90 days and meeting all other requirements  I discussed the assessment and treatment plan with the patient. The patient was provided an opportunity to ask questions and all were answered. The patient agreed with the plan and  demonstrated an understanding of the instructions.  Patient advised to call back or seek an in-person evaluation if the symptoms or condition worsens.  Duration of encounter: 20 minutes.  Note by: Gaspar Cola, MD Date: 12/16/2022; Time: 12:28 PM

## 2022-12-16 NOTE — Patient Instructions (Signed)
__________________________________________________________________________  Post-op Pain Management on a Chronic Pain Patient  Why should the surgeon manage his patient's post-op pain? The Surgeon is uniquely qualified to determine the amount of post-operative pain to be expected on a procedure or surgery that he/she has performed. Even with similar surgeries, the surgeon's perspective on expected pain is unique, since he/she performed the procedure and knows the degree of difficulty and/or tissue damage involved in each particular case. The surgeon is also up to date on events such as blood loss, intraoperative complications, and PO (per orum = Oral) status that may influence not only the patient's dose and schedule, but route of administration as well.  How about telling chronic pain patients to just double up or increase their usual pain medication intake to compensate for the increased pain? ABSOLUTELY NOT. This is a bad idea since it will lead to several problems: The patient running out of his/her usual medications early and this may create a problem at the level of the insurance, which allows supplies of medications based on the amount and schedule stated on the prescription. Running out early may trigger an event where the refill is denied by the insurance company and/or pharmacy, as they will interpret it as an "early refill", and therefore deny it.  This is also a bad idea since it will throw off pill counts required during follow-up monitoring. This practice provides a very poor paper trail as to why this patient ran out of medication early. From the perspective of the pain physician, it creates a nightmare in the accounting of the patient's medication. In addition, it will cause the patient to run out of pills early. From the perspective of the patient, since we do not allow early refills, the patient will run out of medicine probably triggering withdrawals. Getting an early appointment at  our pain facility may also prove to be difficult since available appointments are very difficult to come by.  So, what should I do when confronted with an upcomming surgery and I already take pain medicine on a regular basis?  This is what the surgeon should do: They should not change the dose or schedule of the pain medications prescribed by the pain specialist. This medication regimen allows for the patient's chronic pain to be under control, so as to bring that patient down to the level of an average individual. Have the patient continue their usual pain management regimen, without any alterations. In addition, manage the post-operative pain as they would on any other "narcotic naive" patient (patient that does not take medication). They should not attempt to compensate for tolerance. This is what the patient's usual regimen will do for them. Simply treat the patient as if they had no chronic pain and as if they were not taking any other pain medications. They should talk to the patient about the pain medication, just like you would for anyone else. Do not assume that patients are experts in opioids. Make sure they let the patient know that the medication is to be used only if absolutely necessary. (PRN) Prescribe the medication for as long as they usually on any other patient undergoing the same type of surgery (usually less than 30 days). They should not prescribe for longer than 30 days, unless there is a post-operative complication. They should send [place] a copy of the operative report with information about their choice of the post-op pain medication provided. They should keep [place] informed of any complications that may prolong the average duration patient's  post-op pain.  If you have any questions, please feel to contact us at [phone]. _____________________________________________________________________________________________

## 2023-01-21 DIAGNOSIS — R7989 Other specified abnormal findings of blood chemistry: Secondary | ICD-10-CM | POA: Insufficient documentation

## 2023-03-01 ENCOUNTER — Encounter: Payer: Self-pay | Admitting: Pain Medicine

## 2023-03-02 NOTE — Patient Instructions (Signed)
____________________________________________________________________________________________  Opioid Pain Medication Update  To: All patients taking opioid pain medications. (I.e.: hydrocodone, hydromorphone, oxycodone, oxymorphone, morphine, codeine, methadone, tapentadol, tramadol, buprenorphine, fentanyl, etc.)  Re: Updated review of side effects and adverse reactions of opioid analgesics, as well as new information about long term effects of this class of medications.  Direct risks of long-term opioid therapy are not limited to opioid addiction and overdose. Potential medical risks include serious fractures, breathing problems during sleep, hyperalgesia, immunosuppression, chronic constipation, bowel obstruction, myocardial infarction, and tooth decay secondary to xerostomia.  Unpredictable adverse effects that can occur even if you take your medication correctly: Cognitive impairment, respiratory depression, and death. Most people think that if they take their medication "correctly", and "as instructed", that they will be safe. Nothing could be farther from the truth. In reality, a significant amount of recorded deaths associated with the use of opioids has occurred in individuals that had taken the medication for a long time, and were taking their medication correctly. The following are examples of how this can happen: Patient taking his/her medication for a long time, as instructed, without any side effects, is given a certain antibiotic or another unrelated medication, which in turn triggers a "Drug-to-drug interaction" leading to disorientation, cognitive impairment, impaired reflexes, respiratory depression or an untoward event leading to serious bodily harm or injury, including death.  Patient taking his/her medication for a long time, as instructed, without any side effects, develops an acute impairment of liver and/or kidney function. This will lead to a rapid inability of the body to  breakdown and eliminate their pain medication, which will result in effects similar to an "overdose", but with the same medicine and dose that they had always taken. This again may lead to disorientation, cognitive impairment, impaired reflexes, respiratory depression or an untoward event leading to serious bodily harm or injury, including death.  A similar problem will occur with patients as they grow older and their liver and kidney function begins to decrease as part of the aging process.  Background information: Historically, the original case for using long-term opioid therapy to treat chronic noncancer pain was based on safety assumptions that subsequent experience has called into question. In 1996, the American Pain Society and the American Academy of Pain Medicine issued a consensus statement supporting long-term opioid therapy. This statement acknowledged the dangers of opioid prescribing but concluded that the risk for addiction was low; respiratory depression induced by opioids was short-lived, occurred mainly in opioid-naive patients, and was antagonized by pain; tolerance was not a common problem; and efforts to control diversion should not constrain opioid prescribing. This has now proven to be wrong. Experience regarding the risks for opioid addiction, misuse, and overdose in community practice has failed to support these assumptions.  According to the Centers for Disease Control and Prevention, fatal overdoses involving opioid analgesics have increased sharply over the past decade. Currently, more than 96,700 people die from drug overdoses every year. Opioids are a factor in 7 out of every 10 overdose deaths. Deaths from drug overdose have surpassed motor vehicle accidents as the leading cause of death for individuals between the ages of 35 and 54.  Clinical data suggest that neuroendocrine dysfunction may be very common in both men and women, potentially causing hypogonadism, erectile  dysfunction, infertility, decreased libido, osteoporosis, and depression. Recent studies linked higher opioid dose to increased opioid-related mortality. Controlled observational studies reported that long-term opioid therapy may be associated with increased risk for cardiovascular events. Subsequent meta-analysis concluded   that the safety of long-term opioid therapy in elderly patients has not been proven.   Side Effects and adverse reactions: Common side effects: Drowsiness (sedation). Dizziness. Nausea and vomiting. Constipation. Physical dependence -- Dependence often manifests with withdrawal symptoms when opioids are discontinued or decreased. Tolerance -- As you take repeated doses of opioids, you require increased medication to experience the same effect of pain relief. Respiratory depression -- This can occur in healthy people, especially with higher doses. However, people with COPD, asthma or other lung conditions may be even more susceptible to fatal respiratory impairment.  Uncommon side effects: An increased sensitivity to feeling pain and extreme response to pain (hyperalgesia). Chronic use of opioids can lead to this. Delayed gastric emptying (the process by which the contents of your stomach are moved into your small intestine). Muscle rigidity. Immune system and hormonal dysfunction. Quick, involuntary muscle jerks (myoclonus). Arrhythmia. Itchy skin (pruritus). Dry mouth (xerostomia).  Long-term side effects: Chronic constipation. Sleep-disordered breathing (SDB). Increased risk of bone fractures. Hypothalamic-pituitary-adrenal dysregulation. Increased risk of overdose.  RISKS: Fractures and Falls:  Opioids increase the risk and incidence of falls. This is of particular importance in elderly patients.  Endocrine System:  Long-term administration is associated with endocrine abnormalities (endocrinopathies). (Also known as Opioid-induced Endocrinopathy) Influences  on both the hypothalamic-pituitary-adrenal axis?and the hypothalamic-pituitary-gonadal axis have been demonstrated with consequent hypogonadism and adrenal insufficiency in both sexes. Hypogonadism and decreased levels of dehydroepiandrosterone sulfate have been reported in men and women. Endocrine effects include: Amenorrhoea in women (abnormal absence of menstruation) Reduced libido in both sexes Decreased sexual function Erectile dysfunction in men Hypogonadisms (decreased testicular function with shrinkage of testicles) Infertility Depression and fatigue Loss of muscle mass Anxiety Depression Immune suppression Hyperalgesia Weight gain Anemia Osteoporosis Patients (particularly women of childbearing age) should avoid opioids. There is insufficient evidence to recommend routine monitoring of asymptomatic patients taking opioids in the long-term for hormonal deficiencies.  Immune System: Human studies have demonstrated that opioids have an immunomodulating effect. These effects are mediated via opioid receptors both on immune effector cells and in the central nervous system. Opioids have been demonstrated to have adverse effects on antimicrobial response and anti-tumour surveillance. Buprenorphine has been demonstrated to have no impact on immune function.  Opioid Induced Hyperalgesia: Human studies have demonstrated that prolonged use of opioids can lead to a state of abnormal pain sensitivity, sometimes called opioid induced hyperalgesia (OIH). Opioid induced hyperalgesia is not usually seen in the absence of tolerance to opioid analgesia. Clinically, hyperalgesia may be diagnosed if the patient on long-term opioid therapy presents with increased pain. This might be qualitatively and anatomically distinct from pain related to disease progression or to breakthrough pain resulting from development of opioid tolerance. Pain associated with hyperalgesia tends to be more diffuse than the  pre-existing pain and less defined in quality. Management of opioid induced hyperalgesia requires opioid dose reduction.  Cancer: Chronic opioid therapy has been associated with an increased risk of cancer among noncancer patients with chronic pain. This association was more evident in chronic strong opioid users. Chronic opioid consumption causes significant pathological changes in the small intestine and colon. Epidemiological studies have found that there is a link between opium dependence and initiation of gastrointestinal cancers. Cancer is the second leading cause of death after cardiovascular disease. Chronic use of opioids can cause multiple conditions such as GERD, immunosuppression and renal damage as well as carcinogenic effects, which are associated with the incidence of cancers.   Mortality: Long-term opioid use   has been associated with increased mortality among patients with chronic non-cancer pain (CNCP).  Prescription of long-acting opioids for chronic noncancer pain was associated with a significantly increased risk of all-cause mortality, including deaths from causes other than overdose.  Reference: Von Korff M, Kolodny A, Deyo RA, Chou R. Long-term opioid therapy reconsidered. Ann Intern Med. 2011 Sep 6;155(5):325-8. doi: 10.7326/[id number]. PMID: 21893626; PMCID: PMC3280085. Bedson J, Chen Y, Ashworth J, Hayward RA, Dunn KM, Jordan KP. Risk of adverse events in patients prescribed long-term opioids: A cohort study in the [place] Clinical Practice Research Datalink. Eur J Pain. 2019 May;23(5):908-922. doi: 10.1002/ejp.1357. Epub 2019 Jan 31. PMID: 30620116. Colameco S, Coren JS, Ciervo CA. Continuous opioid treatment for chronic noncancer pain: a time for moderation in prescribing. Postgrad Med. 2009 Jul;121(4):61-6. doi: 10.3810/pgm.2009.07.2032. PMID: 19641271. Chou R, Turner JA, Devine EB, Hansen RN, Sullivan SD, Blazina I, Dana T, Bougatsos C, Deyo RA. The  effectiveness and risks of long-term opioid therapy for chronic pain: a systematic review for a National Institutes of Health Pathways to Prevention Workshop. Ann Intern Med. 2015 Feb 17;162(4):276-86. doi: 10.7326/M14-2559. PMID: 25581257. Warner M, Chen LH, Makuc DM. NCHS Data Brief No. 22. [place]: Centers for Disease Control and Prevention; 2009. Sep, Increase in Fatal Poisonings Involving Opioid Analgesics in the [place], 1999-2006. Song IA, Choi HR, Oh TK. Long-term opioid use and mortality in patients with chronic non-cancer pain: Ten-year follow-up study in [place] from 2010 through 2019. EClinicalMedicine. 2022 Jul 18;51:101558. doi: 10.1016/j.eclinm.2022.101558. PMID: 35875817; PMCID: PMC9304910. Huser, W., Schubert, T., Vogelmann, T. et al. All-cause mortality in patients with long-term opioid therapy compared with non-opioid analgesics for chronic non-cancer pain: a database study. BMC Med 18, 162 (2020). https://doi.org/10.1186/[email or web address] Rashidian H, Zendehdel K, Kamangar F, Malekzadeh R, Haghdoost AA. An Ecological Study of the Association between Opiate Use and Incidence of Cancers. Addict Health. 2016 Fall;8(4):252-260. PMID: 28819556; PMCID: PMC5554805.  Our Goal: Our goal is to control your pain with means other than the use of opioid pain medications.  Our Recommendation: Talk to your physician about coming off of these medications. We can assist you with the tapering down and stopping these medicines. Based on the new information, even if you cannot completely stop the medication, a decrease in the dose may be associated with a lesser risk. Ask for other means of controlling the pain. Decrease or eliminate those factors that significantly contribute to your pain such as smoking, obesity, and a diet heavily tilted towards "inflammatory" nutrients.  Last Updated: 12/30/2022    ____________________________________________________________________________________________     ____________________________________________________________________________________________  Patient Information update  To: All of our patients.  Re: Name change.  It has been made official that our current name, "[facility]"   will soon be changed to "[facility]".   The purpose of this change is to eliminate any confusion created by the concept of our practice being a "Medication Management Pain Clinic". In the past this has led to the misconception that we treat pain primarily by the use of prescription medications.  Nothing can be farther from the truth.   Understanding PAIN MANAGEMENT: To further understand what our practice does, you first have to understand that "Pain Management" is a subspecialty that requires additional training once a physician has completed their specialty training, which can be in either Anesthesia, Neurology, Psychiatry, or Physical Medicine and Rehabilitation (PMR). Each one of these contributes to the final approach taken by each physician to   the management of their patient's pain. To be a "Pain Management Specialist" you must have first completed one of the specialty trainings below.  Anesthesiologists - trained in clinical pharmacology and interventional techniques such as nerve blockade and regional as well as central neuroanatomy. They are trained to block pain before, during, and after surgical interventions.  Neurologists - trained in the diagnosis and pharmacological treatment of complex neurological conditions, such as Multiple Sclerosis, Parkinson's, spinal cord injuries, and other systemic conditions that may be associated with symptoms that may include but are not limited to pain. They tend to rely primarily on the treatment of chronic pain  using prescription medications.  Psychiatrist - trained in conditions affecting the psychosocial wellbeing of patients including but not limited to depression, anxiety, schizophrenia, personality disorders, addiction, and other substance use disorders that may be associated with chronic pain. They tend to rely primarily on the treatment of chronic pain using prescription medications.   Physical Medicine and Rehabilitation (PMR) physicians, also known as physiatrists - trained to treat a wide variety of medical conditions affecting the brain, spinal cord, nerves, bones, joints, ligaments, muscles, and tendons. Their training is primarily aimed at treating patients that have suffered injuries that have caused severe physical impairment. Their training is primarily aimed at the physical therapy and rehabilitation of those patients. They may also work alongside orthopedic surgeons or neurosurgeons using their expertise in assisting surgical patients to recover after their surgeries.  INTERVENTIONAL PAIN MANAGEMENT is sub-subspecialty of Pain Management.  Our physicians are Board-certified in Anesthesia, Pain Management, and Interventional Pain Management.  This meaning that not only have they been trained and Board-certified in their specialty of Anesthesia, and subspecialty of Pain Management, but they have also received further training in the sub-subspecialty of Interventional Pain Management, in order to become Board-certified as INTERVENTIONAL PAIN MANAGEMENT SPECIALIST.    Mission: Our goal is to use our skills in  INTERVENTIONAL PAIN MANAGEMENT as alternatives to the chronic use of prescription opioid medications for the treatment of pain. To make this more clear, we have changed our name to reflect what we do and offer. We will continue to offer medication management assessment and recommendations, but we will not be taking over any patient's medication  management.  ____________________________________________________________________________________________     ____________________________________________________________________________________________  National Pain Medication Shortage  The [place] is experiencing worsening drug shortages. These have had a negative widespread effect on patient care and treatment. Not expected to improve any time soon. Predicted to last past 2029.   Drug shortage list (generic names) Oxycodone IR Oxycodone/APAP Oxymorphone IR Hydromorphone Hydrocodone/APAP Morphine  Where is the problem?  Manufacturing and supply level.  Will this shortage affect you?  Only if you take any of the above pain medications.  How? You may be unable to fill your prescription.  Your pharmacist may offer a "partial fill" of your prescription. (Warning: Do not accept partial fills.) Prescriptions partially filled cannot be transferred to another pharmacy. Read our Medication Rules and Regulation. Depending on how much medicine you are dependent on, you may experience withdrawals when unable to get the medication.  Recommendations: Consider ending your dependence on opioid pain medications. Ask your pain specialist to assist you with the process. Consider switching to a medication currently not in shortage, such as Buprenorphine. Talk to your pain specialist about this option. Consider decreasing your pain medication requirements by managing tolerance thru "Drug Holidays". This may help minimize withdrawals, should you run out of medicine. Control your pain thru   the use of non-pharmacological interventional therapies.   Your prescriber: Prescribers cannot be blamed for shortages. Medication manufacturing and supply issues cannot be fixed by the prescriber.   NOTE: The prescriber is not responsible for supplying the medication, or solving supply issues. Work with your pharmacist to solve it. The patient is responsible for  the decision to take or continue taking the medication and for identifying and securing a legal supply source. By law, supplying the medication is the job and responsibility of the pharmacy. The prescriber is responsible for the evaluation, monitoring, and prescribing of these medications.   Prescribers will NOT: Re-issue prescriptions that have been partially filled. Re-issue prescriptions already sent to a pharmacy.  Re-send prescriptions to a different pharmacy because yours did not have your medication. Ask pharmacist to order more medicine or transfer the prescription to another pharmacy. (Read below.)  New 2023 regulation: "July 03, 2022 Revised Regulation Allows DEA-Registered Pharmacies to Transfer Electronic Prescriptions at a Patient's Request DEA Headquarters Division - Public Information Office Patients now have the ability to request their electronic prescription be transferred to another pharmacy without having to go back to their practitioner to initiate the request. This revised regulation went into effect on Monday, June 29, 2022.     At a patient's request, a DEA-registered retail pharmacy can now transfer an electronic prescription for a controlled substance (schedules II-V) to another DEA-registered retail pharmacy. Prior to this change, patients would have to go through their practitioner to cancel their prescription and have it re-issued to a different pharmacy. The process was taxing and time consuming for both patients and practitioners.    The Drug Enforcement Administration (DEA) published its intent to revise the process for transferring electronic prescriptions on September 20, 2020.  The final rule was published in the federal register on May 28, 2022 and went into effect 30 days later.  Under the final rule, a prescription can only be transferred once between pharmacies, and only if allowed under existing state or other applicable law. The prescription must  remain in its electronic form; may not be altered in any way; and the transfer must be communicated directly between two licensed pharmacists. It's important to note, any authorized refills transfer with the original prescription, which means the entire prescription will be filled at the same pharmacy".  Reference: https://www.dea.gov/stories/2023/2023-07/2022-09-01/revised-regulation-allows-dea-registered-pharmacies-transfer (DEA website announcement)  https://www.govinfo.gov/content/pkg/FR-2022-05-28/pdf/2023-15847.pdf (Federal Register  Department of Justice)   Federal Register / Vol. 88, No. 143 / Thursday, May 28, 2022 / Rules and Regulations DEPARTMENT OF JUSTICE  Drug Enforcement Administration  21 CFR Part 1306  [Docket No. [id number]]  RIN [id number]-AB64 Transfer of Electronic Prescriptions for Schedules II-V Controlled Substances Between Pharmacies for Initial Filling  ____________________________________________________________________________________________     ____________________________________________________________________________________________  Transfer of Pain Medication between Pharmacies  Re: 2023 DEA Clarification on existing regulation  Published on DEA Website: July 03, 2022  Title: Revised Regulation Allows DEA-Registered Pharmacies to Transfer Electronic Prescriptions at a Patient's Request DEA Headquarters Division - Public Information Office  "Patients now have the ability to request their electronic prescription be transferred to another pharmacy without having to go back to their practitioner to initiate the request. This revised regulation went into effect on Monday, June 29, 2022.     At a patient's request, a DEA-registered retail pharmacy can now transfer an electronic prescription for a controlled substance (schedules II-V) to another DEA-registered retail pharmacy. Prior to this change, patients would have to go through their practitioner to  cancel their prescription   and have it re-issued to a different pharmacy. The process was taxing and time consuming for both patients and practitioners.    The Drug Enforcement Administration (DEA) published its intent to revise the process for transferring electronic prescriptions on September 20, 2020.  The final rule was published in the federal register on May 28, 2022 and went into effect 30 days later.  Under the final rule, a prescription can only be transferred once between pharmacies, and only if allowed under existing state or other applicable law. The prescription must remain in its electronic form; may not be altered in any way; and the transfer must be communicated directly between two licensed pharmacists. It's important to note, any authorized refills transfer with the original prescription, which means the entire prescription will be filled at the same pharmacy."    REFERENCES: 1. DEA website announcement https://www.dea.gov/stories/2023/2023-07/2022-09-01/revised-regulation-allows-dea-registered-pharmacies-transfer  2. Department of Justice website  https://www.govinfo.gov/content/pkg/FR-2022-05-28/pdf/2023-15847.pdf  3. DEPARTMENT OF JUSTICE Drug Enforcement Administration 21 CFR Part 1306 [Docket No. [id number]] RIN [id number] "Transfer of Electronic Prescriptions for Schedules II-V Controlled Substances Between Pharmacies for Initial Filling"  ____________________________________________________________________________________________     _______________________________________________________________________  Medication Rules  Purpose: To inform patients, and their family members, of our medication rules and regulations.  Applies to: All patients receiving prescriptions from our practice (written or electronic).  Pharmacy of record: This is the pharmacy where your electronic prescriptions will be sent. Make sure we have the correct one.  Electronic prescriptions: In  compliance with the [place] Strengthen Opioid Misuse Prevention (STOP) Act of 2017 (Session Law 2017-74/H243), effective November 02, 2018, all controlled substances must be electronically prescribed. Written prescriptions, faxing, or calling prescriptions to a pharmacy will no longer be done.  Prescription refills: These will be provided only during in-person appointments. No medications will be renewed without a "face-to-face" evaluation with your provider. Applies to all prescriptions.  NOTE: The following applies primarily to controlled substances (Opioid* Pain Medications).   Type of encounter (visit): For patients receiving controlled substances, face-to-face visits are required. (Not an option and not up to the patient.)  Patient's responsibilities: Pain Pills: Bring all pain pills to every appointment (except for procedure appointments). Pill Bottles: Bring pills in original pharmacy bottle. Bring bottle, even if empty. Always bring the bottle of the most recent fill.  Medication refills: You are responsible for knowing and keeping track of what medications you are taking and when is it that you will need a refill. The day before your appointment: write a list of all prescriptions that need to be refilled. The day of the appointment: give the list to the admitting nurse. Prescriptions will be written only during appointments. No prescriptions will be written on procedure days. If you forget a medication: it will not be "Called in", "Faxed", or "electronically sent". You will need to get another appointment to get these prescribed. No early refills. Do not call asking to have your prescription filled early. Partial  or short prescriptions: Occasionally your pharmacy may not have enough pills to fill your prescription.  NEVER ACCEPT a partial fill or a prescription that is short of the total amount of pills that you were prescribed.  With controlled substances the law allows 72 hours for  the pharmacy to complete the prescription.  If the prescription is not completed within 72 hours, the pharmacist will require a new prescription to be written. This means that you will be short on your medicine and we WILL NOT send another prescription to complete your original   prescription.  Instead, request the pharmacy to send a carrier to a nearby branch to get enough medication to provide you with your full prescription. Prescription Accuracy: You are responsible for carefully inspecting your prescriptions before leaving our office. Have the discharge nurse carefully go over each prescription with you, before taking them home. Make sure that your name is accurately spelled, that your address is correct. Check the name and dose of your medication to make sure it is accurate. Check the number of pills, and the written instructions to make sure they are clear and accurate. Make sure that you are given enough medication to last until your next medication refill appointment. Taking Medication: Take medication as prescribed. When it comes to controlled substances, taking less pills or less frequently than prescribed is permitted and encouraged. Never take more pills than instructed. Never take the medication more frequently than prescribed.  Inform other Doctors: Always inform, all of your healthcare providers, of all the medications you take. Pain Medication from other Providers: You are not allowed to accept any additional pain medication from any other Doctor or Healthcare provider. There are two exceptions to this rule. (see below) In the event that you require additional pain medication, you are responsible for notifying us, as stated below. Cough Medicine: Often these contain an opioid, such as codeine or hydrocodone. Never accept or take cough medicine containing these opioids if you are already taking an opioid* medication. The combination may cause respiratory failure and death. Medication Agreement:  You are responsible for carefully reading and following our Medication Agreement. This must be signed before receiving any prescriptions from our practice. Safely store a copy of your signed Agreement. Violations to the Agreement will result in no further prescriptions. (Additional copies of our Medication Agreement are available upon request.) Laws, Rules, & Regulations: All patients are expected to follow all Federal and State Laws, Statutes, Rules, & Regulations. Ignorance of the Laws does not constitute a valid excuse.  Illegal drugs and Controlled Substances: The use of illegal substances (including, but not limited to marijuana and its derivatives) and/or the illegal use of any controlled substances is strictly prohibited. Violation of this rule may result in the immediate and permanent discontinuation of any and all prescriptions being written by our practice. The use of any illegal substances is prohibited. Adopted CDC guidelines & recommendations: Target dosing levels will be at or below 60 MME/day. Use of benzodiazepines** is not recommended.  Exceptions: There are only two exceptions to the rule of not receiving pain medications from other Healthcare Providers. Exception #1 (Emergencies): In the event of an emergency (i.e.: accident requiring emergency care), you are allowed to receive additional pain medication. However, you are responsible for: As soon as you are able, call our office [phone], at any time of the day or night, and leave a message stating your name, the date and nature of the emergency, and the name and dose of the medication prescribed. In the event that your call is answered by a member of our staff, make sure to document and save the date, time, and the name of the person that took your information.  Exception #2 (Planned Surgery): In the event that you are scheduled by another doctor or dentist to have any type of surgery or procedure, you are allowed (for a period no  longer than 30 days), to receive additional pain medication, for the acute post-op pain. However, in this case, you are responsible for picking up a copy of   our "Post-op Pain Management for Surgeons" handout, and giving it to your surgeon or dentist. This document is available at our office, and does not require an appointment to obtain it. Simply go to our office during business hours (Monday-Thursday from 8:00 AM to 4:00 PM) (Friday 8:00 AM to 12:00 Noon) or if you have a scheduled appointment with us, prior to your surgery, and ask for it by name. In addition, you are responsible for: calling our office [phone], at any time of the day or night, and leaving a message stating your name, name of your surgeon, type of surgery, and date of procedure or surgery. Failure to comply with your responsibilities may result in termination of therapy involving the controlled substances. Medication Agreement Violation. Following the above rules, including your responsibilities will help you in avoiding a Medication Agreement Violation ("Breaking your Pain Medication Contract").  Consequences:  Not following the above rules may result in permanent discontinuation of medication prescription therapy.  *Opioid medications include: morphine, codeine, oxycodone, oxymorphone, hydrocodone, hydromorphone, meperidine, tramadol, tapentadol, buprenorphine, fentanyl, methadone. **Benzodiazepine medications include: diazepam (Valium), alprazolam (Xanax), clonazepam (Klonopine), lorazepam (Ativan), clorazepate (Tranxene), chlordiazepoxide (Librium), estazolam (Prosom), oxazepam (Serax), temazepam (Restoril), triazolam (Halcion) (Last updated: 08/25/2022) ______________________________________________________________________    ______________________________________________________________________  Medication Recommendations and Reminders  Applies to: All patients receiving prescriptions (written and/or  electronic).  Medication Rules & Regulations: You are responsible for reading, knowing, and following our "Medication Rules" document. These exist for your safety and that of others. They are not flexible and neither are we. Dismissing or ignoring them is an act of "non-compliance" that may result in complete and irreversible termination of such medication therapy. For safety reasons, "non-compliance" will not be tolerated. As with the [place] fundamental legal principle of "ignorance of the law is no defense", we will accept no excuses for not having read and knowing the content of documents provided to you by our practice.  Pharmacy of record:  Definition: This is the pharmacy where your electronic prescriptions will be sent.  We do not endorse any particular pharmacy. It is up to you and your insurance to decide what pharmacy to use.  We do not restrict you in your choice of pharmacy. However, once we write for your prescriptions, we will NOT be re-sending more prescriptions to fix restricted supply problems created by your pharmacy, or your insurance.  The pharmacy listed in the electronic medical record should be the one where you want electronic prescriptions to be sent. If you choose to change pharmacy, simply notify our nursing staff. Changes will be made only during your regular appointments and not over the phone.  Recommendations: Keep all of your pain medications in a safe place, under lock and key, even if you live alone. We will NOT replace lost, stolen, or damaged medication. We do not accept "Police Reports" as proof of medications having been stolen. After you fill your prescription, take 1 week's worth of pills and put them away in a safe place. You should keep a separate, properly labeled bottle for this purpose. The remainder should be kept in the original bottle. Use this as your primary supply, until it runs out. Once it's gone, then you know that you have 1 week's worth of medicine,  and it is time to come in for a prescription refill. If you do this correctly, it is unlikely that you will ever run out of medicine. To make sure that the above recommendation works, it is very important that you make   sure your medication refill appointments are scheduled at least 1 week before you run out of medicine. To do this in an effective manner, make sure that you do not leave the office without scheduling your next medication management appointment. Always ask the nursing staff to show you in your prescription , when your medication will be running out. Then arrange for the receptionist to get you a return appointment, at least 7 days before you run out of medicine. Do not wait until you have 1 or 2 pills left, to come in. This is very poor planning and does not take into consideration that we may need to cancel appointments due to bad weather, sickness, or emergencies affecting our staff. DO NOT ACCEPT A "Partial Fill": If for any reason your pharmacy does not have enough pills/tablets to completely fill or refill your prescription, do not allow for a "partial fill". The law allows the pharmacy to complete that prescription within 72 hours, without requiring a new prescription. If they do not fill the rest of your prescription within those 72 hours, you will need a separate prescription to fill the remaining amount, which we will NOT provide. If the reason for the partial fill is your insurance, you will need to talk to the pharmacist about payment alternatives for the remaining tablets, but again, DO NOT ACCEPT A PARTIAL FILL, unless you can trust your pharmacist to obtain the remainder of the pills within 72 hours.  Prescription refills and/or changes in medication(s):  Prescription refills, and/or changes in dose or medication, will be conducted only during scheduled medication management appointments. (Applies to both, written and electronic prescriptions.) No refills on procedure days. No  medication will be changed or started on procedure days. No changes, adjustments, and/or refills will be conducted on a procedure day. Doing so will interfere with the diagnostic portion of the procedure. No phone refills. No medications will be "called into the pharmacy". No Fax refills. No weekend refills. No Holliday refills. No after hours refills.  Remember:  Business hours are:  Monday to Thursday 8:00 AM to 4:00 PM Provider's Schedule: Jalyn Rosero, MD - Appointments are:  Medication management: Monday and Wednesday 8:00 AM to 4:00 PM Procedure day: Tuesday and Thursday 7:30 AM to 4:00 PM Bilal Lateef, MD - Appointments are:  Medication management: Tuesday and Thursday 8:00 AM to 4:00 PM Procedure day: Monday and Wednesday 7:30 AM to 4:00 PM (Last update: 08/25/2022) ______________________________________________________________________    ____________________________________________________________________________________________  Drug Holidays  What is a "Drug Holiday"? Drug Holiday: is the name given to the process of slowly tapering down and temporarily stopping the pain medication for the purpose of decreasing or eliminating tolerance to the drug.  Benefits Improved effectiveness Decreased required effective dose Improved pain control End dependence on high dose therapy Decrease cost of therapy Uncovering "opioid-induced hyperalgesia". (OIH)  What is "opioid hyperalgesia"? It is a paradoxical increase in pain caused by exposure to opioids. Stopping the opioid pain medication, contrary to the expected, it actually decreases or completely eliminates the pain. Ref.: "A comprehensive review of opioid-induced hyperalgesia". Marion Lee, et.al. Pain Physician. 2011 Mar-Apr;14(2):145-61.  What is tolerance? Tolerance: the progressive loss of effectiveness of a pain medicine due to repetitive use. A common problem of opioid pain medications.  How long should a "Drug  Holiday" last? Effectiveness depends on the patient staying off all opioid pain medicines for a minimum of 14 consecutive days. (2 weeks)  How about just taking less of the medicine? Does not   work. Will not accomplish goal of eliminating the excess receptors.  How about switching to a different pain medicine? (AKA. "Opioid rotation") Does not work. Creates the illusion of effectiveness by taking advantage of inaccurate equivalent dose calculations between different opioids. -This "technique" was promoted by studies funded by pharmaceutical companies, such as [employer], creators of "OxyContin".  Can I stop the medicine "cold turkey"? We do not recommend it. You should always coordinate with your prescribing physician to make the transition as smoothly as possible. Avoid stopping the medicine abruptly without consulting. We recommend a "slow taper".  What is a slow taper? Taper: refers to the gradual decrease in dose.   How do I stop/taper the dose? Slowly. Decrease the daily amount of pills that you take by one (1) pill every seven (7) days. This is called a "slow downward taper". Example: if you normally take four (4) pills per day, drop it to three (3) pills per day for seven (7) days, then to two (2) pills per day for seven (7) days, then to one (1) per day for seven (7) days, and then stop the medicine. The 14 day "Drug Holiday" starts on the first day without medicine.   Will I experience withdrawals? Unlikely with a slow taper.  What triggers withdrawals? Withdrawals are triggered by the sudden/abrupt stop of high dose opioids. Withdrawals can be avoided by slowly decreasing the dose over a prolonged period of time.  What are withdrawals? Symptoms associated with sudden/abrupt reduction/stopping of high-dose, long-term use of pain medication. Withdrawal are seldom seen on low dose therapy, or patients rarely taking opioid medication.  Early Withdrawal Symptoms may  include: Agitation Anxiety Muscle aches Increased tearing Insomnia Runny nose Sweating Yawning  Late symptoms may include: Abdominal cramping Diarrhea Dilated pupils Goose bumps Nausea Vomiting  When could I see withdrawals? Onset: 8-24 hours after last use for most opioids. 12-48 hours for long-acting opioids (i.e.: methadone)  How long could they last? Duration: 4-10 days for most opioids. 14-21 days for long-acting opioids (i.e.: methadone)  What will happen after I complete my "Drug Holiday"? The need and indications for the opioid analgesic will be reviewed before restarting the medication. Dose requirements will likely decrease and the dose will need to be adjusted accordingly.   (Last update: 01/20/2023) ____________________________________________________________________________________________    ____________________________________________________________________________________________  WARNING: CBD (cannabidiol) & Delta (Delta-8 tetrahydrocannabinol) products.   Applicable to:  All individuals currently taking or considering taking CBD (cannabidiol) and, more important, all patients taking opioid analgesic controlled substances (pain medication). (Example: oxycodone; oxymorphone; hydrocodone; hydromorphone; morphine; methadone; tramadol; tapentadol; fentanyl; buprenorphine; butorphanol; dextromethorphan; meperidine; codeine; etc.)  Introduction:  Recently there has been a drive towards the use of "natural" products for the treatment of different conditions, including pain anxiety and sleep disorders. Marijuana and hemp are two varieties of the cannabis genus plants. Marijuana and its derivatives are illegal, while hemp and its derivatives are not. Cannabidiol (CBD) and tetrahydrocannabinol (THC), are two natural compounds found in plants of the Cannabis genus. They can both be extracted from hemp or marijuana. Both compounds interact with your body's endocannabinoid  system in very different ways. CBD is associated with pain relief (analgesia) while THC is associated with the psychoactive effects ("the high") obtained from the use of marijuana products. There are two main types of THC: Delta-9, which comes from the marijuana plant and it is illegal, and Delta-8, which comes from the hemp plant, and it is legal. (Both, Delta-9-THC and Delta-8-THC are psychoactive and   give you "the high".)   Legality:  Marijuana and its derivatives: illegal Hemp and its derivatives: Legal (State dependent) UPDATE: (12/19/2021) The Drug Enforcement Agency (DEA) issued a letter stating that "delta" cannabinoids, including Delta-8-THCO and Delta-9-THCO, synthetically derived from hemp do not qualify as hemp and will be viewed as Schedule I drugs. (Schedule I drugs, substances, or chemicals are defined as drugs with no currently accepted medical use and a high potential for abuse. Some examples of Schedule I drugs are: heroin, lysergic acid diethylamide (LSD), marijuana (cannabis), 3,4-methylenedioxymethamphetamine (ecstasy), methaqualone, and peyote.) (https://www.dea.gov)  Legal status of CBD in [place]:  "Conditionally Legal"  Reference: "FDA Regulation of Cannabis and Cannabis-Derived Products, Including Cannabidiol (CBD)" - https://www.fda.gov/news-events/public-health-focus/fda-regulation-cannabis-and-cannabis-derived-products-including-cannabidiol-cbd  Warning:  CBD is not FDA approved and has not undergo the same manufacturing controls as prescription drugs.  This means that the purity and safety of available CBD may be questionable. Most of the time, despite manufacturer's claims, it is contaminated with THC (delta-9-tetrahydrocannabinol - the chemical in marijuana responsible for the "HIGH").  When this is the case, the THC contaminant will trigger a positive urine drug screen (UDS) test for Marijuana (carboxy-THC).   The FDA recently put out a warning about 5 things that everyone  should be aware of regarding Delta-8 THC: Delta-8 THC products have not been evaluated or approved by the FDA for safe use and may be marketed in ways that put the public health at risk. The FDA has received adverse event reports involving delta-8 THC-containing products. Delta-8 THC has psychoactive and intoxicating effects. Delta-8 THC manufacturing often involve use of potentially harmful chemicals to create the concentrations of delta-8 THC claimed in the marketplace. The final delta-8 THC product may have potentially harmful by-products (contaminants) due to the chemicals used in the process. Manufacturing of delta-8 THC products may occur in uncontrolled or unsanitary settings, which may lead to the presence of unsafe contaminants or other potentially harmful substances. Delta-8 THC products should be kept out of the reach of children and pets.  NOTE: Because a positive UDS for any illicit substance is a violation of our medication agreement, your opioid analgesics (pain medicine) may be permanently discontinued.  MORE ABOUT CBD  General Information: CBD was discovered in 1940 and it is a derivative of the cannabis sativa genus plants (Marijuana and Hemp). It is one of the 113 identified substances found in Marijuana. It accounts for up to 40% of the plant's extract. As of 2018, preliminary clinical studies on CBD included research for the treatment of anxiety, movement disorders, and pain. CBD is available and consumed in multiple forms, including inhalation of smoke or vapor, as an aerosol spray, and by mouth. It may be supplied as an oil containing CBD, capsules, dried cannabis, or as a liquid solution. CBD is thought not to be as psychoactive as THC (delta-9-tetrahydrocannabinol - the chemical in marijuana responsible for the "HIGH"). Studies suggest that CBD may interact with different biological target receptors in the body, including cannabinoid and other neurotransmitter receptors. As of  2018 the mechanism of action for its biological effects has not been determined.  Side-effects  Adverse reactions: Dry mouth, diarrhea, decreased appetite, fatigue, drowsiness, malaise, weakness, sleep disturbances, and others.  Drug interactions:  CBD may interact with medications such as blood-thinners. CBD causes drowsiness on its own and it will increase drowsiness caused by other medications, including antihistamines (such as Benadryl), benzodiazepines (Xanax, Ativan, Valium), antipsychotics, antidepressants, opioids, alcohol and supplements such as kava, melatonin and St. John's Wort.    Other drug interactions: Brivaracetam (Briviact); Caffeine; Carbamazepine (Tegretol); Citalopram (Celexa); Clobazam (Onfi); Eslicarbazepine (Aptiom); Everolimus (Zostress); Lithium; Methadone (Dolophine); Rufinamide (Banzel); Sedative medications (CNS depressants); Sirolimus (Rapamune); Stiripentol (Diacomit); Tacrolimus (Prograf); Tamoxifen ; Soltamox); Topiramate (Topamax); Valproate; Warfarin (Coumadin); Zonisamide. (Last update: 10/12/2022) ____________________________________________________________________________________________   ____________________________________________________________________________________________  Naloxone Nasal Spray  Why am I receiving this medication? [place] STOP ACT requires that all patients taking high dose opioids or at risk of opioids respiratory depression, be prescribed an opioid reversal agent, such as Naloxone (AKA: Narcan).  What is this medication? NALOXONE (nal OX one) treats opioid overdose, which causes slow or shallow breathing, severe drowsiness, or trouble staying awake. Call emergency services after using this medication. You may need additional treatment. Naloxone works by reversing the effects of opioids. It belongs to a group of medications called opioid blockers.  COMMON BRAND NAME(S): Kloxxado, Narcan  What should I tell my care team before  I take this medication? They need to know if you have any of these conditions: Heart disease Substance use disorder An unusual or allergic reaction to naloxone, other medications, foods, dyes, or preservatives Pregnant or trying to get pregnant Breast-feeding  When to use this medication? This medication is to be used for the treatment of respiratory depression (less than 8 breaths per minute) secondary to opioid overdose.   How to use this medication? This medication is for use in the nose. Lay the person on their back. Support their neck with your hand and allow the head to tilt back before giving the medication. The nasal spray should be given into 1 nostril. After giving the medication, move the person onto their side. Do not remove or test the nasal spray until ready to use. Get emergency medical help right away after giving the first dose of this medication, even if the person wakes up. You should be familiar with how to recognize the signs and symptoms of a narcotic overdose. If more doses are needed, give the additional dose in the other nostril. Talk to your care team about the use of this medication in children. While this medication may be prescribed for children as young as newborns for selected conditions, precautions do apply.  Naloxone Overdosage: If you think you have taken too much of this medicine contact a poison control center or emergency room at once.  NOTE: This medicine is only for you. Do not share this medicine with others.  What if I miss a dose? This does not apply.  What may interact with this medication? This is only used during an emergency. No interactions are expected during emergency use. This list may not describe all possible interactions. Give your health care provider a list of all the medicines, herbs, non-prescription drugs, or dietary supplements you use. Also tell them if you smoke, drink alcohol, or use illegal drugs. Some items may interact with  your medicine.  What should I watch for while using this medication? Keep this medication ready for use in the case of an opioid overdose. Make sure that you have the phone number of your care team and local hospital ready. You may need to have additional doses of this medication. Each nasal spray contains a single dose. Some emergencies may require additional doses. After use, bring the treated person to the nearest hospital or call 911. Make sure the treating care team knows that the person has received a dose of this medication. You will receive additional instructions on what to do during and after use of this   medication before an emergency occurs.  What side effects may I notice from receiving this medication? Side effects that you should report to your care team as soon as possible: Allergic reactions--skin rash, itching, hives, swelling of the face, lips, tongue, or throat Side effects that usually do not require medical attention (report these to your care team if they continue or are bothersome): Constipation Dryness or irritation inside the nose Headache Increase in blood pressure Muscle spasms Stuffy nose Toothache This list may not describe all possible side effects. Call your doctor for medical advice about side effects. You may report side effects to FDA at 1-800-FDA-1088.  Where should I keep my medication? Because this is an emergency medication, you should keep it with you at all times.  Keep out of the reach of children and pets. Store between 20 and 25 degrees C (68 and 77 degrees F). Do not freeze. Throw away any unused medication after the expiration date. Keep in original box until ready to use.  NOTE: This sheet is a summary. It may not cover all possible information. If you have questions about this medicine, talk to your doctor, pharmacist, or health care provider.   2023 Elsevier/Gold Standard (2021-06-27  00:00:00)  ____________________________________________________________________________________________   

## 2023-03-02 NOTE — Progress Notes (Signed)
PROVIDER NOTE: Information contained herein reflects review and annotations entered in association with encounter. Interpretation of such information and data should be left to medically-trained personnel. Information provided to patient can be located elsewhere in the medical record under "Patient Instructions". Document created using STT-dictation technology, any transcriptional errors that may result from process are unintentional.    Patient: Stephanie [name]  Service Category: E/M  Provider: Oswaldo Done, MD  DOB: [birth year]  DOS: 03/03/2023  Referring Provider: Luciana Axe, NP  MRN: [id number]  Specialty: Interventional Pain Management  PCP: Luciana Axe, NP  Type: Established Patient  Setting: Ambulatory outpatient    Location: Office  Delivery: Face-to-face     HPI  [name], a 62 y.o. year old female, is here today because of her No primary diagnosis found.. Stephanie [name]'s primary complain today is No chief complaint on file.  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); Musculoskeletal pain; Neurogenic pain; Spasm of paraspinal muscle; Chronic hip pain (Bilateral) (R>L); Osteoarthritis of knee (Bilateral); Osteoarthritis of hip (Bilateral); Osteoarthritis of sacroiliac joint (Bilateral); Chronic sacroiliac joint pain (Bilateral); Chronic pain syndrome; Failed back surgical syndrome; Eczema of hands (Bilateral) (R>L); Abnormal MRI, knee (04/04/2018); 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 Pain Assessment: Severity of   is reported as a  /10. Location:    / . Onset:  . Quality:  . Timing:  . Modifying factor(s):  Marland Kitchen Vitals:  vitals were not taken for this visit.  BMI: Estimated body mass index is 25.06 kg/m as calculated from the following:   Height as of 12/07/22: 5\' 7"  (1.702 m).   Weight as of 12/07/22: 160 lb (72.6 kg). Last encounter: 12/16/2022. Last procedure: Visit date not found.  Reason for encounter:  *** . ***  Pharmacotherapy Assessment  Analgesic: Hydrocodone/acetaminophen 10/325 mg, 0.5 tab PO BID (10 mg/day of hydrocodone) MME/day: 10 mg/day.   Monitoring: [place] PMP: PDMP reviewed during this encounter.       Pharmacotherapy: No side-effects or adverse reactions reported. Compliance: No problems identified. Effectiveness: Clinically acceptable.  No notes on file  No results found for: "CBDTHCR" No results found for: "D8THCCBX" No results found for: "D9THCCBX"  UDS:  Summary  Date Value Ref Range Status  04/27/2022 Note  Final    Comment:    ==================================================================== [employer] Select 13 (MW) ==================================================================== Test                             Result       Flag       Units  Drug Present and Declared for Prescription Verification   Hydrocodone                    691          EXPECTED  ng/mg creat   Hydromorphone                  206          EXPECTED   ng/mg creat   Norhydrocodone                 2196         EXPECTED   ng/mg creat    Sources of hydrocodone include scheduled prescription medications.     Hydromorphone and norhydrocodone are expected metabolites of    hydrocodone. Hydromorphone is also available as a scheduled    prescription medication.  ==================================================================== Test                      Result    Flag   Units      Ref Range   Creatinine              89               mg/dL      >=29 ==================================================================== Declared Medications:  The flagging and interpretation on this report are based on the  following declared medications.  Unexpected results may arise from  inaccuracies in the declared medications.   **Note: The testing scope of this panel includes these medications:   Hydrocodone (Norco)   **Note: The testing scope of this panel does not include the  following reported medications:   Acetaminophen (Norco)  Cholecalciferol  Cyanocobalamin  Cyclobenzaprine (Flexeril)  Dulaglutide (Trulicity)  Escitalopram (Lexapro)  Gabapentin (Neurontin)  Ibuprofen (Advil)  Melatonin  Meloxicam (Mobic)  Metformin  Triamcinolone (Kenalog) ==================================================================== For clinical consultation, please call [phone]. ====================================================================       ROS  Constitutional: Denies any fever or chills Gastrointestinal: No reported hemesis, hematochezia, vomiting, or acute GI distress Musculoskeletal: Denies any acute onset joint swelling, redness, loss of ROM, or weakness Neurological: No reported episodes of acute onset apraxia, aphasia, dysarthria, agnosia, amnesia, paralysis, loss of coordination, or loss of consciousness  Medication Review  Dulaglutide, HYDROcodone-acetaminophen, Melatonin, celecoxib, cyclobenzaprine, enoxaparin, escitalopram, gabapentin, ibuprofen, meloxicam, naloxone, ondansetron, oxyCODONE, traMADol, and triamcinolone ointment  History Review  Allergy: Stephanie [name] is allergic  to phenergan [promethazine hcl]. Drug: Stephanie [name]  reports no history of drug use. Alcohol:  reports no history of alcohol use. Tobacco:  reports that she has never smoked. She has never used smokeless tobacco. Social: Stephanie [name]  reports that she has never smoked. She has never used smokeless tobacco. She reports that she does not drink alcohol and does not use drugs. Medical:  has a past medical history of Allergy, Anemia, iron deficiency (04/09/2014), Arthritis, Atypical chest pain (04/09/2014), CD (contact dermatitis) (03/01/2014), Chest pain (04/09/2014), Chronic pain syndrome, Degenerative disc disease, lumbar, Depression, Diabetes mellitus without complication (HCC), Lumbar radicular pain (08/22/2015), Lumbar spinal stenosis, Osteoarthritis, Vitamin B12 deficiency, and Vitamin D deficiency. Surgical: Stephanie [name]  has a past surgical history that includes Lumbar spine surgery (2010); Appendectomy (2015); Cholecystectomy (2014); Cesarean section (1990); Bunionectomy (Bilateral, 2014); Cesarean section (1991); Knee arthroscopy (Right, 2014); and Knee Arthroplasty (Right, 12/07/2022). Family: family history includes Asthma in her father; Stroke in her mother.  Laboratory Chemistry Profile   Renal Lab Results  Component Value Date   BUN 14 11/24/2022   CREATININE 0.66 11/24/2022   GFRAA >60 09/01/2019   GFRNONAA >60 11/24/2022    Hepatic Lab Results  Component Value Date   AST 16 11/24/2022   ALT 11 11/24/2022  ALBUMIN 4.1 11/24/2022   ALKPHOS 48 11/24/2022    Electrolytes Lab Results  Component Value Date   NA 138 11/24/2022   K 3.5 11/24/2022   CL 109 11/24/2022   CALCIUM 8.7 (L) 11/24/2022   MG 1.9 09/01/2019    Bone Lab Results  Component Value Date   VD25OH 33.18 09/01/2019   25OHVITD1 34 05/07/2016   25OHVITD2 <1.0 05/07/2016   25OHVITD3 34 05/07/2016    Inflammation (CRP: Acute Phase) (ESR: Chronic Phase) Lab Results  Component Value Date   CRP <0.5 11/24/2022    ESRSEDRATE 7 11/24/2022         Note: Above Lab results reviewed.  Recent Imaging Review  DG Knee Right Port CLINICAL DATA:  232140 Total knee replacement status 232140  EXAM: PORTABLE RIGHT KNEE - 1-2 VIEW  COMPARISON:  12/21/2017  FINDINGS: Interval postsurgical changes from right total knee arthroplasty. Arthroplasty components are in their expected alignment. No periprosthetic fracture or evidence of other complication. Expected postoperative changes within the overlying soft tissues.  IMPRESSION: Status post right total knee arthroplasty without evidence of complication.  Electronically Signed   By: Duanne Guess D.O.   On: 12/07/2022 14:59 Note: Reviewed        Physical Exam  General appearance: Well nourished, well developed, and well hydrated. In no apparent acute distress Mental status: Alert, oriented x 3 (person, place, & time)       Respiratory: No evidence of acute respiratory distress Eyes: PERLA Vitals: LMP  (LMP Unknown)  BMI: Estimated body mass index is 25.06 kg/m as calculated from the following:   Height as of 12/07/22: 5\' 7"  (1.702 m).   Weight as of 12/07/22: 160 lb (72.6 kg). Ideal: Patient weight not recorded  Assessment   Diagnosis Status  No diagnosis found. Controlled Controlled Controlled   Updated Problems: No problems updated.  Plan of Care  Problem-specific:  No problem-specific Assessment & Plan notes found for this encounter.  Stephanie [name] has a current medication list which includes the following long-term medication(s): cyclobenzaprine, enoxaparin, escitalopram, gabapentin, hydrocodone-acetaminophen, hydrocodone-acetaminophen, hydrocodone-acetaminophen, meloxicam, and triamcinolone ointment.  Pharmacotherapy (Medications Ordered): No orders of the defined types were placed in this encounter.  Orders:  No orders of the defined types were placed in this encounter.  Follow-up plan:   No follow-ups on file.       Interventional Therapies  Risk  Complexity Considerations:   Estimated body mass index is 27.25 kg/m as calculated from the following:   Height as of this encounter: 5\' 7"  (1.702 m).   Weight as of this encounter: 174 lb (78.9 kg). WNL   Planned  Pending:   Diagnostic right caudal ESI #2    Under consideration:   Diagnostic bilateral lumbar facet MBB Diagnostic Caudal ESI Diagnostic Bilateral L4-5 & L5-S1Lumbar TFESI Diagnostic bilateral sacroiliac joint Blk Diagnostic bilateral hip joint injection Diagnostic bilateral genicular NB Possible Racz procedure    Completed:   Palliative Caudal ESI x1 (02/01/2017) (100/100/80/80)   Completed by other providers:   Surgery: Right total knee replacement by Dr. Francesco Sor (12/07/2022)  Therapeutic right knee IA steroid inj. x6 (05/14/2020, 07/22/2020, 08/12/2020, 09/15/2021, 12/29/2021, 04/07/2022) by Lurline Hare, PA-C  Therapeutic left knee IA steroid inj. x4 (04/29/2020, 07/22/2020, 08/12/2020, 09/29/2021) by Lurline Hare, PA-C    Therapeutic  Palliative (PRN) options:   Palliative Caudal ESI #2    Pharmacotherapy  Nonopioids transferred 10/07/2020: Mobic, Neurontin, Flexeril, melatonin, and triamcinolone ointment.  Recent Visits Date Type Provider Dept  12/16/22 Office Visit Delano Metz, MD [facility]  Showing recent visits within past 90 days and meeting all other requirements Future Appointments Date Type Provider Dept  03/03/23 Appointment Delano Metz, MD [facility]  Showing future appointments within next 90 days and meeting all other requirements  I discussed the assessment and treatment plan with the patient. The patient was provided an opportunity to ask questions and all were answered. The patient agreed with the plan and demonstrated an understanding of the instructions.  Patient advised to call back or seek an in-person evaluation if the symptoms or condition  worsens.  Duration of encounter: *** minutes.  Total time on encounter, as per AMA guidelines included both the face-to-face and non-face-to-face time personally spent by the physician and/or other qualified health care professional(s) on the day of the encounter (includes time in activities that require the physician or other qualified health care professional and does not include time in activities normally performed by clinical staff). Physician's time may include the following activities when performed: Preparing to see the patient (e.g., pre-charting review of records, searching for previously ordered imaging, lab work, and nerve conduction tests) Review of prior analgesic pharmacotherapies. Reviewing PMP Interpreting ordered tests (e.g., lab work, imaging, nerve conduction tests) Performing post-procedure evaluations, including interpretation of diagnostic procedures Obtaining and/or reviewing separately obtained history Performing a medically appropriate examination and/or evaluation Counseling and educating the patient/family/caregiver Ordering medications, tests, or procedures Referring and communicating with other health care professionals (when not separately reported) Documenting clinical information in the electronic or other health record Independently interpreting results (not separately reported) and communicating results to the patient/ family/caregiver Care coordination (not separately reported)  Note by: Oswaldo Done, MD Date: 03/03/2023; Time: 7:50 AM

## 2023-03-03 ENCOUNTER — Encounter: Payer: Self-pay | Admitting: Pain Medicine

## 2023-03-03 ENCOUNTER — Ambulatory Visit: Payer: No Typology Code available for payment source | Attending: Pain Medicine | Admitting: Pain Medicine

## 2023-03-03 VITALS — BP 120/63 | HR 84 | Temp 97.2°F | Resp 16 | Ht 67.0 in | Wt 165.0 lb

## 2023-03-03 DIAGNOSIS — M79604 Pain in right leg: Secondary | ICD-10-CM | POA: Diagnosis present

## 2023-03-03 DIAGNOSIS — M545 Low back pain, unspecified: Secondary | ICD-10-CM

## 2023-03-03 DIAGNOSIS — G894 Chronic pain syndrome: Secondary | ICD-10-CM | POA: Diagnosis present

## 2023-03-03 DIAGNOSIS — M79605 Pain in left leg: Secondary | ICD-10-CM | POA: Diagnosis present

## 2023-03-03 DIAGNOSIS — M79622 Pain in left upper arm: Secondary | ICD-10-CM | POA: Diagnosis present

## 2023-03-03 DIAGNOSIS — Z79899 Other long term (current) drug therapy: Secondary | ICD-10-CM | POA: Diagnosis present

## 2023-03-03 DIAGNOSIS — M25552 Pain in left hip: Secondary | ICD-10-CM | POA: Insufficient documentation

## 2023-03-03 DIAGNOSIS — M25562 Pain in left knee: Secondary | ICD-10-CM | POA: Insufficient documentation

## 2023-03-03 DIAGNOSIS — M5441 Lumbago with sciatica, right side: Secondary | ICD-10-CM | POA: Insufficient documentation

## 2023-03-03 DIAGNOSIS — M79621 Pain in right upper arm: Secondary | ICD-10-CM | POA: Diagnosis present

## 2023-03-03 DIAGNOSIS — M542 Cervicalgia: Secondary | ICD-10-CM | POA: Diagnosis present

## 2023-03-03 DIAGNOSIS — Z79891 Long term (current) use of opiate analgesic: Secondary | ICD-10-CM | POA: Insufficient documentation

## 2023-03-03 DIAGNOSIS — M961 Postlaminectomy syndrome, not elsewhere classified: Secondary | ICD-10-CM | POA: Insufficient documentation

## 2023-03-03 DIAGNOSIS — G8929 Other chronic pain: Secondary | ICD-10-CM | POA: Diagnosis present

## 2023-03-03 DIAGNOSIS — M25561 Pain in right knee: Secondary | ICD-10-CM | POA: Diagnosis present

## 2023-03-03 DIAGNOSIS — M5442 Lumbago with sciatica, left side: Secondary | ICD-10-CM | POA: Insufficient documentation

## 2023-03-03 DIAGNOSIS — M25551 Pain in right hip: Secondary | ICD-10-CM | POA: Insufficient documentation

## 2023-03-03 MED ORDER — HYDROCODONE-ACETAMINOPHEN 10-325 MG PO TABS: 0.5000 | ORAL_TABLET | Freq: Two times a day (BID) | ORAL | 0 refills | Status: DC | PRN

## 2023-03-03 NOTE — Progress Notes (Signed)
Safety precautions to be maintained throughout the outpatient stay will include: orient to surroundings, keep bed in low position, maintain call bell within reach at all times, provide assistance with transfer out of bed and ambulation.   Nursing Pain Medication Assessment:  Safety precautions to be maintained throughout the outpatient stay will include: orient to surroundings, keep bed in low position, maintain call bell within reach at all times, provide assistance with transfer out of bed and ambulation.  Medication Inspection Compliance: Pill count conducted under aseptic conditions, in front of the patient. Neither the pills nor the bottle was removed from the patient's sight at any time. Once count was completed pills were immediately returned to the patient in their original bottle.  Medication: Hydrocodone/APAP Pill/Patch Count:  8.5 of 30 pills remain Pill/Patch Appearance: Markings consistent with prescribed medication Bottle Appearance: Standard pharmacy container. Clearly labeled. Filled Date: 04 / 08 / 2024 Last Medication intake:  Today

## 2023-03-07 LAB — TOXASSURE SELECT 13 (MW), URINE

## 2023-03-07 NOTE — H&P (Signed)
ORTHOPAEDIC HISTORY & PHYSICAL Jayshon Dommer, Adelina Mings., MD - 02/25/2023 10:45 AM EDT Formatting of this note is different from the original. Images from the original note were not included. Chief Complaint: Chief Complaint Patient presents with Post Operative Visit  Reason for Visit: The patient is a 62 y.o. female who presents today for reevaluation of her right knee. She is almost 3 months status post right total knee arthroplasty. She had significant restriction of her knee range of motion preoperatively. Her postoperative course was unremarkable with the exception of progressive decrease of her motion. She missed some physical therapy sessions but has tried to be extremely conscientious with both attending her physical therapy and performing her home exercises. She denies any fevers, chills, or erythema to the knee. She is not using any ambulatory aids.  Medications: Current Outpatient Medications Medication Sig Dispense Refill alcohol swabs PadM Apply 1 each topically once daily And as needed Before using lancet to prick finger 200 each 1 cholecalciferol (VITAMIN D3) 5,000 unit capsule Take 1 capsule (5,000 Units total) by mouth once daily cyanocobalamin (VITAMIN B12) 1000 MCG tablet Take 1 tablet (1,000 mcg total) by mouth once daily . 360 tablet 11 cyclobenzaprine (FLEXERIL) 10 MG tablet Take 10 mg by mouth 2 (two) times daily as needed for Muscle spasms Reported on 12/18/2015 0 dulaglutide (TRULICITY) 4.5 mg/0.5 mL subcutaneous pen injector Inject 0.5 mLs (4.5 mg total) subcutaneously every 7 (seven) days 6 mL 1 escitalopram oxalate (LEXAPRO) 20 MG tablet TAKE 1 TABLET BY MOUTH EVERY DAY 30 tablet 1 gabapentin (NEURONTIN) 800 MG tablet Take 0.5 tablets (400 mg total) by mouth 2 (two) times daily AND 1 tablet (800 mg total) nightly. 180 tablet 3 HYDROcodone-acetaminophen (NORCO) 10-325 mg tablet Take 1 tablet by mouth once daily 1/2 TABLET Q12H PRN 0 lurasidone (LATUDA) 20 mg tablet  Take 20 mg by mouth once daily melatonin 12 mg Tab Take 1 tablet by mouth at bedtime as needed naloxone (NARCAN) 4 mg/actuation nasal spray Place 4 mg into one nostril once as needed ONETOUCH VERIO TEST STRIPS test strip 1 EACH (1 STRIP TOTAL) ONCE DAILY AND AS NEEDED 100 strip 25 triamcinolone 0.1 % ointment Apply topically 2 (two) times daily as needed blood glucose meter kit as directed 1 each 0 lancets Use 1 each once daily And as needed 150 each 12  No current facility-administered medications for this visit.  Allergies: Allergies Allergen Reactions Promethazine Anxiety Restlessness, "makes me not feel right"  Past Medical History: Past Medical History: Diagnosis Date Back pain Diabetes mellitus (CMS/HHS-HCC) Eczema, unspecified Long term (current) use of opiate analgesic 08/22/2015 Pain in both upper arms 10/04/2019 Sleep disturbance 08/23/2015  Past Surgical History: Past Surgical History: Procedure Laterality Date Right knee arthroscopy 2014 [place] Right total knee arthroplasty using computer-assisted navigation 12/07/2022 Dr Ernest Pine APPENDECTOMY BUNION CORRECTION CESAREAN SECTION CHOLECYSTECTOMY LAMINECTOMY LUMBAR SPINE  Social History: Social History  Socioeconomic History Marital status: Widowed Number of children: 2 Years of education: 12 Highest education level: High school graduate Occupational History Occupation: Disabled Tobacco Use Smoking status: Never Smokeless tobacco: Never Vaping Use Vaping status: Never Used Substance and Sexual Activity Alcohol use: No Drug use: Defer Sexual activity: Defer Partners: Male Birth control/protection: Post-menopausal Social History Narrative She moved to [place] from [place], [place] which is near [place]. She lives with her 58 yo daughter who just started a teaching job at [employer] Disabled  Social Determinants of Health  Food Insecurity: No Food Insecurity (12/07/2022) Received from [facility],  [place] [facility] Vital Sign Worried About Running Out of Food in the Last Year: Never true Ran Out of Food in the Last Year: Never true Transportation Needs: No Transportation Needs (12/07/2022) [facility], [facility] - Transportation Lack of Transportation (Medical): No Lack of Transportation (Non-Medical): No  Family History: Family History Problem Relation Name Age of Onset Diabetes type II Mother Stroke Mother Diabetes type II Sister Diabetes type II Brother  Review of Systems: A comprehensive 14 point ROS was performed, reviewed, and the pertinent orthopaedic findings are documented in the HPI.  Exam BP 116/74  Ht 170.2 cm (5\' 7" )  Wt 76.8 kg (169 lb 6.4 oz)  LMP 07/14/2015 (Exact Date)  BMI 26.53 kg/m  General: Well-developed, well-nourished female seen in no acute distress. Stiff legged gait. No varus or valgus thrust to the right knee.  HEENT: Atraumatic, normocephalic. Pupils are equal and reactive to light. Extraocular motion is intact. Sclera are clear. Oropharynx is clear with moist mucosa.  Lungs: Clear to auscultation bilaterally.  Cardiovascular: Regular rate and rhythm. Normal S1, S2. No murmur . No appreciable gallops or rubs. Peripheral pulses are palpable. No lower extremity edema. Homan`s test is negative.  Extremities: Good strength, stability, and range of motion of the upper extremities. Good range of motion of the hips and ankles.  Right Knee: Soft tissue swelling: minimal Effusion: none Erythema: none Crepitance: none Tenderness: Mild peripatellar tenderness Alignment: normal Mediolateral laxity: stable Atrophy: No significant atrophy. Quadriceps tone was fair to good. Range of Motion: 0/0/85 degrees  Neurologic: Awake, alert, and oriented. Sensory function is intact to pinprick and light touch. Motor strength is judged to be 5/5. Motor coordination is within normal limits. No apparent clonus. No  tremor.  Impression: Arthrofibrosis of the right knee Right total knee arthroplasty  Plan: Notes from physical therapy were reviewed. The findings were discussed in detail with the patient. Conservative treatment options were reviewed with the patient. She has not made any significant improvement of her range of motion despite aggressive physical therapy. We discussed the risks and benefits of manipulation of the right knee under anesthesia and possible arthroscopic lysis of adhesions. This would be followed by the immediate return to physical therapy as well as probable CPM use. The usual perioperative course was also discussed in detail. The patient expressed understanding of the risks and benefits of surgical intervention and would like to proceed with plans for manipulation of the right knee under anesthesia and possible arthroscopic lysis of adhesions.  I spent a total of 40 minutes in both face-to-face and non-face-to-face activities, excluding procedures performed, for this visit on the date of this encounter.  MEDICAL CLEARANCE: Per anesthesiology. ACTIVITIES: As tolerated WORK STATUS: Not applicable. THERAPY: Quadriceps strengthening exercises. MEDICATIONS: Requested Prescriptions  No prescriptions requested or ordered in this encounter  FOLLOW-UP: Return for postoperative follow-up.  Charday Capetillo P. Angie Fava., M.D.  This note was generated in part with voice recognition software and I apologize for any typographical errors that were not detected and corrected. Electronically signed by Shari Heritage., MD at 02/28/2023 3:52 PM EDT

## 2023-03-07 NOTE — Discharge Instructions (Signed)
  Instructions after Knee Arthroscopy    Stephanie [name] P. Sharmel Ballantine, Jr., M.D.     Dept. of Orthopaedics & Sports Medicine  [facility]  [address]  [place], [place]  [address]   Phone: [phone]   Fax: [phone]   DIET: Drink plenty of non-alcoholic fluids & begin a light diet. Resume your normal diet the day after surgery.  ACTIVITY:  You may use crutches or a walker with weight-bearing as tolerated, unless instructed otherwise. You may wean yourself off of the walker or crutches as tolerated.  Begin doing gentle exercises. Exercising will reduce the pain and swelling, increase motion, and prevent muscle weakness.   Avoid strenuous activities or athletics for a minimum of 4-6 weeks after arthroscopic surgery. Do not drive or operate any equipment until instructed.  WOUND CARE:  Place one to two pillows under the knee the first day or two when sitting or lying.  Continue to use the ice packs periodically to reduce pain and swelling. The small incisions in your knee are closed with nylon stitches. The stitches will be removed in the office. The bulky dressing may be removed on the second day after surgery. DO NOT TOUCH THE STITCHES. Put a Band-Aid over each stitch. Do NOT use any ointments or creams on the incisions.  You may bathe or shower after the stitches are removed at the first office visit following surgery.  MEDICATIONS: You may resume your regular medications. Please take the pain medication as prescribed. Do not take pain medication on an empty stomach. Do not drive or drink alcoholic beverages when taking pain medications.  CALL THE OFFICE FOR: Temperature above 101 degrees Excessive bleeding or drainage on the dressing. Excessive swelling, coldness, or paleness of the toes. Persistent nausea and vomiting.  FOLLOW-UP:  You should have an appointment to return to the office in 7-10 days after surgery.     [facility] Department Directory          [email or web address]       [email or web address]          Cardiology  Appointments: [place] - [phone] [place] - [phone]  Endocrinology  Appointments: [place] - [phone] [place] - [phone]  Gastroenterology  Appointments: [place] - [phone] [place] - [phone]        General Surgery   Appointments: [place] - [phone]  Internal Medicine/Family Medicine  Appointments: [place] - [phone] [place] - [phone] [place] - [phone]  Metabolic and Weigh Loss Surgery  Appointments: [place] - [phone]        Neurology  Appointments: [place] - [phone] [place] - [phone]  Neurosurgery  Appointments: [place] - [phone]  Obstetrics & Gynecology  Appointments: [place] - [phone] [place] - [phone]        Pediatrics  Appointments: [place] - [phone] [place] - [phone]  Physiatry  Appointments: [place] -[phone]  Physical Therapy  Appointments: [place] - [phone] [place] - [phone]        Podiatry  Appointments: [place] - [phone] [place] - [phone]  Pulmonology  Appointments: [place] - [phone]  Rheumatology  Appointments: [place] - [phone]        [place] Location: [facility]  [address] , [place]  [address]  [place] Location: [facility] [address] [place], [place]  [address]  [place] Location: [facility] [address] [place],   [address]    

## 2023-03-08 ENCOUNTER — Other Ambulatory Visit: Payer: Self-pay

## 2023-03-08 ENCOUNTER — Encounter
Admission: RE | Admit: 2023-03-08 | Discharge: 2023-03-08 | Disposition: A | Discharge status: Home / Self Care | Payer: Medicare Other | Source: Ambulatory Visit | Attending: Orthopedic Surgery | Admitting: Orthopedic Surgery

## 2023-03-08 DIAGNOSIS — Z01812 Encounter for preprocedural laboratory examination: Secondary | ICD-10-CM

## 2023-03-08 DIAGNOSIS — E119 Type 2 diabetes mellitus without complications: Secondary | ICD-10-CM

## 2023-03-08 HISTORY — DX: Myoneural disorder, unspecified: G70.9

## 2023-03-08 NOTE — Patient Instructions (Addendum)
Your procedure is scheduled on:  03/10/23 - Wednesday Report to the Registration Desk on the 1st floor of the [facility]. To find out your arrival time, please call [phone] between 1PM - 3PM on: 03/09/23 - Tuesday If your arrival time is 6:00 am, do not arrive before that time as the [facility] entrance doors do not open until 6:00 am.  REMEMBER: Instructions that are not followed completely may result in serious medical risk, up to and including death; or upon the discretion of your surgeon and anesthesiologist your surgery may need to be rescheduled.  Do not eat food after midnight the night before surgery.  No gum chewing or hard candies.  You may however, drink CLEAR liquids up to 2 hours before you are scheduled to arrive for your surgery. Do not drink anything within 2 hours of your scheduled arrival time.  Clear liquids include: - water   In addition, your doctor has ordered for you to drink the provided:  Gatorade G2 Drinking this carbohydrate drink up to two hours before surgery helps to reduce insulin resistance and improve patient outcomes. Please complete drinking 2 hours before scheduled arrival time.  One week prior to surgery: Stop meloxicam (MOBIC) and Anti-inflammatories (NSAIDS) such as Advil, Aleve, Ibuprofen, Motrin, Naproxen, Naprosyn and Aspirin based products such as Excedrin, Goody's Powder, BC Powder.   Stop ANY OVER THE COUNTER supplements until after surgery.  You may take Tylenol if needed for pain up until the day of surgery.  TAKE ONLY THESE MEDICATIONS THE MORNING OF SURGERY WITH A SIP OF WATER: NONE   HOLD Dulaglutide (TRULICITY) 7 days prior to your surgery.  No Alcohol for 24 hours before or after surgery.  No Smoking including e-cigarettes for 24 hours before surgery.  No chewable tobacco products for at least 6 hours before surgery.  No nicotine patches on the day of surgery.  Do not use any "recreational" drugs for at least a week  (preferably 2 weeks) before your surgery.  Please be advised that the combination of cocaine and anesthesia may have negative outcomes, up to and including death. If you test positive for cocaine, your surgery will be cancelled.  On the morning of surgery brush your teeth with toothpaste and water, you may rinse your mouth with mouthwash if you wish. Do not swallow any toothpaste or mouthwash.  Use CHG Soap or wipes as directed on instruction sheet.  Do not wear jewelry, make-up, hairpins, clips or nail polish.  Do not wear lotions, powders, or perfumes.   Do not shave body hair from the neck down 48 hours before surgery.  Contact lenses, hearing aids and dentures may not be worn into surgery.  Do not bring valuables to the hospital. [facility] is not responsible for any missing/lost belongings or valuables.   Notify your doctor if there is any change in your medical condition (cold, fever, infection).  Wear comfortable clothing (specific to your surgery type) to the hospital.  After surgery, you can help prevent lung complications by doing breathing exercises.  Take deep breaths and cough every 1-2 hours. Your doctor may order a device called an Incentive Spirometer to help you take deep breaths. When coughing or sneezing, hold a pillow firmly against your incision with both hands. This is called "splinting." Doing this helps protect your incision. It also decreases belly discomfort.  If you are being admitted to the hospital overnight, leave your suitcase in the car. After surgery it may be brought  to your room.  In case of increased patient census, it may be necessary for you, the patient, to continue your postoperative care in the Same Day Surgery department.  If you are being discharged the day of surgery, you will not be allowed to drive home. You will need a responsible individual to drive you home and stay with you for 24 hours after surgery.   If you are taking public  transportation, you will need to have a responsible individual with you.  Please call the Pre-admissions Testing Dept. at [phone] if you have any questions about these instructions.  Surgery Visitation Policy:  Patients having surgery or a procedure may have two visitors.  Children under the age of 49 must have an adult with them who is not the patient.  Inpatient Visitation:    Visiting hours are 7 a.m. to 8 p.m. Up to four visitors are allowed at one time in a patient room. The visitors may rotate out with other people during the day.  One visitor age 78 or older may stay with the patient overnight and must be in the room by 8 p.m.    Preparing for Surgery with CHLORHEXIDINE GLUCONATE (CHG) Soap  Chlorhexidine Gluconate (CHG) Soap  o An antiseptic cleaner that kills germs and bonds with the skin to continue killing germs even after washing  o Used for showering the night before surgery and morning of surgery  Before surgery, you can play an important role by reducing the number of germs on your skin.  CHG (Chlorhexidine gluconate) soap is an antiseptic cleanser which kills germs and bonds with the skin to continue killing germs even after washing.  Please do not use if you have an allergy to CHG or antibacterial soaps. If your skin becomes reddened/irritated stop using the CHG.  1. Shower the NIGHT BEFORE SURGERY and the MORNING OF SURGERY with CHG soap.  2. If you choose to wash your hair, wash your hair first as usual with your normal shampoo.  3. After shampooing, rinse your hair and body thoroughly to remove the shampoo.  4. Use CHG as you would any other liquid soap. You can apply CHG directly to the skin and wash gently with a scrungie or a clean washcloth.  5. Apply the CHG soap to your body only from the neck down. Do not use on open wounds or open sores. Avoid contact with your eyes, ears, mouth, and genitals (private parts). Wash face and genitals (private  parts) with your normal soap.  6. Wash thoroughly, paying special attention to the area where your surgery will be performed.  7. Thoroughly rinse your body with warm water.  8. Do not shower/wash with your normal soap after using and rinsing off the CHG soap.  9. Pat yourself dry with a clean towel.  10. Wear clean pajamas to bed the night before surgery.  12. Place clean sheets on your bed the night of your first shower and do not sleep with pets.  13. Shower again with the CHG soap on the day of surgery prior to arriving at the hospital.  14. Do not apply any deodorants/lotions/powders.  15. Please wear clean clothes to the hospital.

## 2023-03-09 ENCOUNTER — Encounter
Admission: RE | Admit: 2023-03-09 | Discharge: 2023-03-09 | Disposition: A | Discharge status: Home / Self Care | Payer: Medicare Other | Source: Ambulatory Visit | Attending: Orthopedic Surgery | Admitting: Orthopedic Surgery

## 2023-03-09 DIAGNOSIS — Z96651 Presence of right artificial knee joint: Secondary | ICD-10-CM

## 2023-03-09 LAB — SEDIMENTATION RATE: Sed Rate: 5 mm/hr (ref 0–30)

## 2023-03-09 LAB — C-REACTIVE PROTEIN: CRP: 0.9 mg/dL (ref <1.0)

## 2023-03-09 NOTE — Progress Notes (Signed)
  Perioperative Services Pre-Admission/Anesthesia Testing    Date: 03/09/23  Name: Stephanie [name] MRN:   [id number]  Re: GLP-1 clearance and provider recommendations   Planned Surgical Procedure(s):    Case: [id number] Date/Time: 03/10/23 1528   Procedure: MANIPULATION UNDER ANESTHESIA AND POSSIBLE KNEE ARTHROSCOPY WITH LYSIS OF ADHESIONS OF RIGHT KNEE. 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 In review of her medication reconciliation it was noted that patient is on a prescribed GLP-1 medication. Per guidelines issued by the American Society of Anesthesiologists (ASA), it is recommended that these medications be held for 7 days prior to the patient undergoing any type of elective surgical procedure. The patient is taking the following GLP-1 medication:  []  SEMAGLUTIDE   []  EXENATIDE  []  LIRAGLUTIDE   []  LIXISENATIDE  [x]  DULAGLUTIDE     []  TIRZEPATIDE (GLP-1/GIP)  Reached out to prescribing provider Rolan Lipa, NP-C) to make them aware of the guidelines from anesthesia. Given that this patient takes the prescribed GLP-1 medication for her  diabetes diagnosis, rather than for weight loss, recommendations from the prescribing provider were solicited. Prescribing provider made aware of the following so that informed decision/POC can be developed for this patient that may be taking medications belonging to these drug classes:  Oral GLP-1 medications will be held 1 day prior to surgery.  Injectable GLP-1 medications will be held 7 days prior to surgery.  Metformin is routinely held 48 hours prior to surgery due to renal concerns, potential  need for contrasted imaging perioperatively, and the potential for tissue hypoxia leading to drug induced lactic acidosis.  All SGLT2i medications are held 72 hours prior to surgery as they can be associated with the increased potential for developing euglycemic diabetic ketoacidosis (EDKA).   Impression and Plan:  Stephanie [name] is on a prescribed GLP-1 medication, which induces the known side effect of decreased gastric emptying. Efforts are bring made to mitigate the risk of perioperative hyperglycemic events, as elevated blood glucose levels have been found to contribute to intra/postoperative complications. Additionally, hyperglycemic extremes can potentially necessitate the postponing of a patient's elective case in order to better optimize perioperative glycemic control, again with the aforementioned guidelines in place. With this in mind, recommendations have been sought from the prescribing provider, who has cleared patient to proceed with holding the prescribed GLP-1 as per the guidelines from the ASA.   Provider recommending: no further recommendations received from the prescribing provider.  Copy of signed clearance and recommendations placed on patient's chart for inclusion in their medical record and for review by the surgical/anesthetic team on the day of her procedure.   Quentin Mulling, MSN, APRN, FNP-C, CEN [facility]  Peri-operative Services Nurse Practitioner Phone: [phone] 03/09/23 4:54 PM  NOTE: This note has been prepared using Dragon dictation software. Despite my best ability to proofread, there is always the potential that unintentional transcriptional errors may still occur from this process.

## 2023-03-10 ENCOUNTER — Encounter
Admission: RE | Disposition: A | Discharge status: Home / Self Care | Payer: Self-pay | Source: Ambulatory Visit | Attending: Orthopedic Surgery

## 2023-03-10 ENCOUNTER — Encounter: Payer: Self-pay | Admitting: Orthopedic Surgery

## 2023-03-10 ENCOUNTER — Ambulatory Visit
Admission: RE | Admit: 2023-03-10 | Discharge: 2023-03-10 | Disposition: A | Discharge status: Home / Self Care | Payer: Medicare Other | Source: Ambulatory Visit | Attending: Orthopedic Surgery | Admitting: Orthopedic Surgery

## 2023-03-10 ENCOUNTER — Other Ambulatory Visit: Payer: Self-pay

## 2023-03-10 ENCOUNTER — Ambulatory Visit: Payer: Medicare Other | Admitting: Urgent Care

## 2023-03-10 DIAGNOSIS — Z7985 Long-term (current) use of injectable non-insulin antidiabetic drugs: Secondary | ICD-10-CM | POA: Insufficient documentation

## 2023-03-10 DIAGNOSIS — F32A Depression, unspecified: Secondary | ICD-10-CM | POA: Insufficient documentation

## 2023-03-10 DIAGNOSIS — Z7984 Long term (current) use of oral hypoglycemic drugs: Secondary | ICD-10-CM | POA: Insufficient documentation

## 2023-03-10 DIAGNOSIS — Z96651 Presence of right artificial knee joint: Secondary | ICD-10-CM | POA: Diagnosis not present

## 2023-03-10 DIAGNOSIS — M24661 Ankylosis, right knee: Secondary | ICD-10-CM | POA: Insufficient documentation

## 2023-03-10 DIAGNOSIS — E119 Type 2 diabetes mellitus without complications: Secondary | ICD-10-CM | POA: Diagnosis not present

## 2023-03-10 DIAGNOSIS — Z01812 Encounter for preprocedural laboratory examination: Secondary | ICD-10-CM

## 2023-03-10 DIAGNOSIS — Z79899 Other long term (current) drug therapy: Secondary | ICD-10-CM | POA: Diagnosis not present

## 2023-03-10 DIAGNOSIS — G8929 Other chronic pain: Secondary | ICD-10-CM | POA: Diagnosis not present

## 2023-03-10 DIAGNOSIS — M549 Dorsalgia, unspecified: Secondary | ICD-10-CM | POA: Diagnosis not present

## 2023-03-10 DIAGNOSIS — G709 Myoneural disorder, unspecified: Secondary | ICD-10-CM | POA: Diagnosis not present

## 2023-03-10 DIAGNOSIS — Z87891 Personal history of nicotine dependence: Secondary | ICD-10-CM | POA: Insufficient documentation

## 2023-03-10 DIAGNOSIS — Z79891 Long term (current) use of opiate analgesic: Secondary | ICD-10-CM | POA: Insufficient documentation

## 2023-03-10 HISTORY — PX: KNEE ARTHROSCOPY: SHX127

## 2023-03-10 LAB — GLUCOSE, CAPILLARY
Glucose-Capillary: 118 mg/dL — ABNORMAL HIGH (ref 70–99)
Glucose-Capillary: 128 mg/dL — ABNORMAL HIGH (ref 70–99)

## 2023-03-10 SURGERY — ARTHROSCOPY, KNEE
Anesthesia: General | Site: Knee | Laterality: Right

## 2023-03-10 MED ORDER — ONDANSETRON HCL 4 MG PO TABS: 4.0000 mg | ORAL_TABLET | Freq: Four times a day (QID) | ORAL | Status: DC | PRN

## 2023-03-10 MED ORDER — FAMOTIDINE 20 MG PO TABS
ORAL_TABLET | ORAL | Status: AC
  Filled 2023-03-10: qty 1

## 2023-03-10 MED ORDER — CELECOXIB 200 MG PO CAPS
ORAL_CAPSULE | ORAL | Status: AC
  Filled 2023-03-10: qty 1

## 2023-03-10 MED ORDER — CHLORHEXIDINE GLUCONATE 0.12 % MT SOLN
OROMUCOSAL | Status: AC
  Filled 2023-03-10: qty 15

## 2023-03-10 MED ORDER — ACETAMINOPHEN 10 MG/ML IV SOLN: 1000.0000 mg | Freq: Once | INTRAVENOUS | Status: DC | PRN

## 2023-03-10 MED ORDER — OXYCODONE HCL 5 MG PO TABS
5.0000 mg | ORAL_TABLET | Freq: Once | ORAL | Status: AC | PRN
  Administered 2023-03-10: 5 mg via ORAL

## 2023-03-10 MED ORDER — CHLORHEXIDINE GLUCONATE 0.12 % MT SOLN
15.0000 mL | Freq: Once | OROMUCOSAL | Status: AC
  Administered 2023-03-10: 15 mL via OROMUCOSAL

## 2023-03-10 MED ORDER — METOCLOPRAMIDE HCL 10 MG PO TABS: 5.0000 mg | ORAL_TABLET | Freq: Three times a day (TID) | ORAL | Status: DC | PRN

## 2023-03-10 MED ORDER — HYDROCODONE-ACETAMINOPHEN 5-325 MG PO TABS: 1.0000 | ORAL_TABLET | ORAL | 0 refills | Status: DC | PRN

## 2023-03-10 MED ORDER — HYDROCODONE-ACETAMINOPHEN 7.5-325 MG PO TABS: 1.0000 | ORAL_TABLET | ORAL | Status: DC | PRN

## 2023-03-10 MED ORDER — DEXAMETHASONE SODIUM PHOSPHATE 10 MG/ML IJ SOLN
INTRAMUSCULAR | Status: DC | PRN
  Administered 2023-03-10: 10 mg via INTRAVENOUS

## 2023-03-10 MED ORDER — ONDANSETRON HCL 4 MG/2ML IJ SOLN
INTRAMUSCULAR | Status: AC
  Filled 2023-03-10: qty 2

## 2023-03-10 MED ORDER — BUPIVACAINE HCL (PF) 0.25 % IJ SOLN
INTRAMUSCULAR | Status: AC
  Filled 2023-03-10: qty 30

## 2023-03-10 MED ORDER — MIDAZOLAM HCL 2 MG/2ML IJ SOLN
INTRAMUSCULAR | Status: AC
  Filled 2023-03-10: qty 2

## 2023-03-10 MED ORDER — ACETAMINOPHEN 10 MG/ML IV SOLN
INTRAVENOUS | Status: AC
  Filled 2023-03-10: qty 100

## 2023-03-10 MED ORDER — FENTANYL CITRATE (PF) 100 MCG/2ML IJ SOLN
INTRAMUSCULAR | Status: DC | PRN
  Administered 2023-03-10 (×2): 50 ug via INTRAVENOUS

## 2023-03-10 MED ORDER — ONDANSETRON HCL 4 MG/2ML IJ SOLN: 4.0000 mg | Freq: Four times a day (QID) | INTRAMUSCULAR | Status: DC | PRN

## 2023-03-10 MED ORDER — FENTANYL CITRATE (PF) 100 MCG/2ML IJ SOLN
INTRAMUSCULAR | Status: AC
  Filled 2023-03-10: qty 2

## 2023-03-10 MED ORDER — ACETAMINOPHEN 325 MG PO TABS: 325.0000 mg | ORAL_TABLET | Freq: Four times a day (QID) | ORAL | Status: DC | PRN

## 2023-03-10 MED ORDER — CELECOXIB 200 MG PO CAPS
400.0000 mg | ORAL_CAPSULE | Freq: Once | ORAL | Status: AC
  Administered 2023-03-10: 400 mg via ORAL

## 2023-03-10 MED ORDER — CEFAZOLIN SODIUM-DEXTROSE 2-4 GM/100ML-% IV SOLN
2.0000 g | INTRAVENOUS | Status: AC
  Administered 2023-03-10: 2 g via INTRAVENOUS

## 2023-03-10 MED ORDER — OXYCODONE HCL 5 MG PO TABS
ORAL_TABLET | ORAL | Status: AC
  Filled 2023-03-10: qty 1

## 2023-03-10 MED ORDER — DEXAMETHASONE SODIUM PHOSPHATE 10 MG/ML IJ SOLN
INTRAMUSCULAR | Status: AC
  Filled 2023-03-10: qty 1

## 2023-03-10 MED ORDER — CEFAZOLIN SODIUM-DEXTROSE 2-4 GM/100ML-% IV SOLN
INTRAVENOUS | Status: AC
  Filled 2023-03-10: qty 100

## 2023-03-10 MED ORDER — ONDANSETRON HCL 4 MG/2ML IJ SOLN: 4.0000 mg | Freq: Once | INTRAMUSCULAR | Status: DC | PRN

## 2023-03-10 MED ORDER — SODIUM CHLORIDE 0.9 % IV SOLN: INTRAVENOUS | Status: DC

## 2023-03-10 MED ORDER — PROPOFOL 10 MG/ML IV BOLUS
INTRAVENOUS | Status: DC | PRN
  Administered 2023-03-10: 200 mg via INTRAVENOUS

## 2023-03-10 MED ORDER — ORAL CARE MOUTH RINSE: 15.0000 mL | Freq: Once | OROMUCOSAL | Status: AC

## 2023-03-10 MED ORDER — MORPHINE SULFATE (PF) 2 MG/ML IV SOLN: 0.5000 mg | INTRAVENOUS | Status: DC | PRN

## 2023-03-10 MED ORDER — HYDROCODONE-ACETAMINOPHEN 5-325 MG PO TABS: 1.0000 | ORAL_TABLET | ORAL | Status: DC | PRN

## 2023-03-10 MED ORDER — LIDOCAINE HCL (PF) 2 % IJ SOLN
INTRAMUSCULAR | Status: AC
  Filled 2023-03-10: qty 5

## 2023-03-10 MED ORDER — LIDOCAINE HCL (CARDIAC) PF 100 MG/5ML IV SOSY
PREFILLED_SYRINGE | INTRAVENOUS | Status: DC | PRN
  Administered 2023-03-10: 50 mg via INTRAVENOUS

## 2023-03-10 MED ORDER — ONDANSETRON HCL 4 MG/2ML IJ SOLN
INTRAMUSCULAR | Status: DC | PRN
  Administered 2023-03-10: 4 mg via INTRAVENOUS

## 2023-03-10 MED ORDER — MIDAZOLAM HCL 2 MG/2ML IJ SOLN
INTRAMUSCULAR | Status: DC | PRN
  Administered 2023-03-10: 2 mg via INTRAVENOUS

## 2023-03-10 MED ORDER — PROPOFOL 10 MG/ML IV BOLUS
INTRAVENOUS | Status: AC
  Filled 2023-03-10: qty 20

## 2023-03-10 MED ORDER — OXYCODONE HCL 5 MG/5ML PO SOLN: 5.0000 mg | Freq: Once | ORAL | Status: AC | PRN

## 2023-03-10 MED ORDER — EPINEPHRINE PF 1 MG/ML IJ SOLN
INTRAMUSCULAR | Status: AC
  Filled 2023-03-10: qty 1

## 2023-03-10 MED ORDER — FENTANYL CITRATE (PF) 100 MCG/2ML IJ SOLN
25.0000 ug | INTRAMUSCULAR | Status: DC | PRN
  Administered 2023-03-10 (×2): 25 ug via INTRAVENOUS

## 2023-03-10 MED ORDER — METOCLOPRAMIDE HCL 5 MG/ML IJ SOLN: 5.0000 mg | Freq: Three times a day (TID) | INTRAMUSCULAR | Status: DC | PRN

## 2023-03-10 MED ORDER — ACETAMINOPHEN 10 MG/ML IV SOLN
INTRAVENOUS | Status: DC | PRN
  Administered 2023-03-10: 1000 mg via INTRAVENOUS

## 2023-03-10 MED ORDER — FAMOTIDINE 20 MG PO TABS
20.0000 mg | ORAL_TABLET | Freq: Once | ORAL | Status: AC
  Administered 2023-03-10: 20 mg via ORAL

## 2023-03-10 NOTE — Interval H&P Note (Signed)
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  The various methods of treatment have been discussed with the patient and family. After consideration of risks, benefits and other options for treatment, the patient has consented to  Procedure(s): MANIPULATION UNDER ANESTHESIA AND POSSIBLE KNEE ARTHROSCOPY WITH LYSIS OF ADHESIONS OF RIGHT KNEE. (Right) as a surgical intervention.  The patient's history has been reviewed, patient examined, no change in status, stable for surgery.  I have reviewed the patient's chart and labs.  Questions were answered to the patient's satisfaction.     Wirt Hemmerich P Kainat Pizana

## 2023-03-10 NOTE — Op Note (Signed)
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   ASSISTANT: None  ANESTHESIA: general  ESTIMATED BLOOD LOSS: None  FLUIDS REPLACED: 500 mL of crystalloid  TOURNIQUET TIME: Not used  INDICATIONS FOR SURGERY: Stephanie [name] is a 71 y.o. year old female who underwent right total knee arthroplasty approximately 3 months ago.  Despite aggressive physical therapy, she has had progressive decrease in her knee range of motion.  After discussion of the risks and benefits of surgical intervention, the patient expressed understanding of the risks benefits and agree with plans for manipulation of the right knee under anesthesia and possible knee arthroscopy with lysis of adhesions.Marland Kitchen   PROCEDURE IN DETAIL: The patient was brought into the operating room and, after adequate general anesthesia was achieved, a "timeout" was performed as per usual protocol.  The patient's knee was flexed prior to manipulation and only 85 degrees of flexion was achieved passively.  Next, left shoulder was placed on the patient's right proximal tibia and my ear was placed against the lateral aspect of the knee.  General pressure was applied to the tibia and the knee was flexed.  There was audible lysis of adhesions with gradual progression of her motion.  At the conclusion of the manipulation, measurements were obtained and the patient achieved 130 degrees of passive flexion.  It was thus determined that arthroscopy would not be indicated.  Patient tolerated procedure well.  She was transported to the recovery room in stable condition.  Danaye Sobh P. Angie Fava M.D.

## 2023-03-10 NOTE — Anesthesia Postprocedure Evaluation (Signed)
Anesthesia Post Note  Patient: Stephanie [name]  Procedure(s) Performed: MANIPULATION UNDER ANESTHESIA (Right: Knee)  Patient location during evaluation: PACU Anesthesia Type: General Level of consciousness: awake and alert Pain management: pain level controlled Vital Signs Assessment: post-procedure vital signs reviewed and stable Respiratory status: spontaneous breathing, nonlabored ventilation, respiratory function stable and patient connected to nasal cannula oxygen Cardiovascular status: blood pressure returned to baseline and stable Postop Assessment: no apparent nausea or vomiting Anesthetic complications: no   No notable events documented.   Last Vitals:  Vitals:   03/10/23 1615 03/10/23 1630  BP: 114/64 124/70  Pulse: 71 73  Resp: 13 (!) 21  Temp: 36.7 C   SpO2: 100% 100%    Last Pain:  Vitals:   03/10/23 1630  TempSrc:   PainSc: 0-No pain                 Corinda Gubler

## 2023-03-10 NOTE — Anesthesia Preprocedure Evaluation (Signed)
Anesthesia Evaluation  Patient identified by MRN, date of birth, ID band Patient awake    Reviewed: Allergy & Precautions, NPO status , Patient's Chart, lab work & pertinent test results  History of Anesthesia Complications Negative for: history of anesthetic complications  Airway Mallampati: II  TM Distance: >3 FB Neck ROM: full    Dental no notable dental hx. (+) Teeth Intact   Pulmonary neg pulmonary ROS, neg sleep apnea, neg COPD, Patient abstained from smoking.Not current smoker   Pulmonary exam normal breath sounds clear to auscultation       Cardiovascular Exercise Tolerance: Good METS(-) hypertension(-) angina (-) CAD, (-) Past MI and (-) DOE negative cardio ROS Normal cardiovascular exam(-) dysrhythmias  Rhythm:Regular Rate:Normal - Systolic murmurs    Neuro/Psych  PSYCHIATRIC DISORDERS  Depression     Neuromuscular disease    GI/Hepatic negative GI ROS, Neg liver ROS,neg GERD  ,,  Endo/Other  diabetes, Oral Hypoglycemic Agents  GLP1 held for 9 days  Renal/GU negative Renal ROS     Musculoskeletal  (+) Arthritis ,  Chronic back pain on narcotics     Abdominal   Peds  Hematology  (+) Blood dyscrasia, anemia   Anesthesia Other Findings Past Medical History: No date: Allergy 04/09/2014: Anemia, iron deficiency No date: Arthritis 04/09/2014: Atypical chest pain 03/01/2014: CD (contact dermatitis) 04/09/2014: Chest pain No date: Chronic pain syndrome No date: Degenerative disc disease, lumbar No date: Depression No date: Diabetes mellitus without complication (HCC) 08/22/2015: Lumbar radicular pain No date: Lumbar spinal stenosis No date: Osteoarthritis No date: Vitamin B12 deficiency No date: Vitamin D deficiency  Past Surgical History: 2015: APPENDECTOMY 2014: BUNIONECTOMY; Bilateral 1990: CESAREAN SECTION 1991: CESAREAN SECTION 2014: CHOLECYSTECTOMY 2014: KNEE ARTHROSCOPY; Right 2010: LUMBAR  SPINE SURGERY     Comment:  spinal stenosis L5; rods  BMI    Body Mass Index: 25.06 kg/m      Reproductive/Obstetrics negative OB ROS                              Anesthesia Physical Anesthesia Plan  ASA: 2  Anesthesia Plan: General   Post-op Pain Management: Celebrex PO (pre-op)* and Ofirmev IV (intra-op)*   Induction: Intravenous  PONV Risk Score and Plan: 2 and Ondansetron, Dexamethasone and Midazolam  Airway Management Planned: LMA  Additional Equipment: None  Intra-op Plan:   Post-operative Plan: Extubation in OR  Informed Consent: I have reviewed the patients History and Physical, chart, labs and discussed the procedure including the risks, benefits and alternatives for the proposed anesthesia with the patient or authorized representative who has indicated his/her understanding and acceptance.     Dental advisory given  Plan Discussed with: CRNA and Surgeon  Anesthesia Plan Comments: (Discussed risks of anesthesia with patient, including PONV, sore throat, lip/dental/eye damage. Rare risks discussed as well, such as cardiorespiratory and neurological sequelae, and allergic reactions. Discussed the role of CRNA in patient's perioperative care. Patient understands.)        Anesthesia Quick Evaluation

## 2023-03-10 NOTE — Transfer of Care (Signed)
Immediate Anesthesia Transfer of Care Note  Patient: Stephanie [name]  Procedure(s) Performed: MANIPULATION UNDER ANESTHESIA (Right: Knee)  Patient Location: PACU  Anesthesia Type:General  Level of Consciousness: drowsy, patient cooperative, and responds to stimulation  Airway & Oxygen Therapy: Patient Spontanous Breathing and Patient connected to face mask oxygen  Post-op Assessment: Report given to RN and Post -op Vital signs reviewed and stable  Post vital signs: Reviewed and stable  Last Vitals:  Vitals Value Taken Time  BP 114/64 03/10/23 1615  Temp 36.7 C 03/10/23 1615  Pulse 70 03/10/23 1616  Resp 18 03/10/23 1616  SpO2 100 % 03/10/23 1616  Vitals shown include unvalidated device data.  Last Pain:  Vitals:   03/10/23 1615  TempSrc:   PainSc: 0-No pain         Complications: No notable events documented.

## 2023-03-10 NOTE — Anesthesia Procedure Notes (Signed)
Procedure Name: LMA Insertion Date/Time: 03/10/2023 3:55 PM  Performed by: Jeannene Patella, CRNAPre-anesthesia Checklist: Patient identified, Emergency Drugs available, Suction available, Patient being monitored and Timeout performed Patient Re-evaluated:Patient Re-evaluated prior to induction Oxygen Delivery Method: Circle system utilized Preoxygenation: Pre-oxygenation with 100% oxygen Induction Type: IV induction Ventilation: Mask ventilation without difficulty LMA: LMA inserted LMA Size: 4.0 Number of attempts: 1 Placement Confirmation: positive ETCO2 and breath sounds checked- equal and bilateral Tube secured with: Tape Dental Injury: Teeth and Oropharynx as per pre-operative assessment  Comments: Left front capped tooth found to be loose prior to LMA insertion soft guaze roll right molars

## 2023-03-11 ENCOUNTER — Encounter: Payer: Self-pay | Admitting: Orthopedic Surgery

## 2023-05-31 ENCOUNTER — Ambulatory Visit: Payer: No Typology Code available for payment source | Attending: Pain Medicine | Admitting: Pain Medicine

## 2023-05-31 ENCOUNTER — Encounter: Payer: Self-pay | Admitting: Pain Medicine

## 2023-05-31 VITALS — BP 122/72 | HR 77 | Temp 97.2°F | Resp 16 | Ht 67.0 in | Wt 160.0 lb

## 2023-05-31 DIAGNOSIS — M79622 Pain in left upper arm: Secondary | ICD-10-CM | POA: Diagnosis present

## 2023-05-31 DIAGNOSIS — M25561 Pain in right knee: Secondary | ICD-10-CM | POA: Insufficient documentation

## 2023-05-31 DIAGNOSIS — M25552 Pain in left hip: Secondary | ICD-10-CM | POA: Insufficient documentation

## 2023-05-31 DIAGNOSIS — M5441 Lumbago with sciatica, right side: Secondary | ICD-10-CM | POA: Diagnosis not present

## 2023-05-31 DIAGNOSIS — M961 Postlaminectomy syndrome, not elsewhere classified: Secondary | ICD-10-CM | POA: Insufficient documentation

## 2023-05-31 DIAGNOSIS — M79605 Pain in left leg: Secondary | ICD-10-CM | POA: Insufficient documentation

## 2023-05-31 DIAGNOSIS — G8929 Other chronic pain: Secondary | ICD-10-CM | POA: Insufficient documentation

## 2023-05-31 DIAGNOSIS — M79621 Pain in right upper arm: Secondary | ICD-10-CM | POA: Insufficient documentation

## 2023-05-31 DIAGNOSIS — Z79899 Other long term (current) drug therapy: Secondary | ICD-10-CM | POA: Insufficient documentation

## 2023-05-31 DIAGNOSIS — M5442 Lumbago with sciatica, left side: Secondary | ICD-10-CM | POA: Insufficient documentation

## 2023-05-31 DIAGNOSIS — M25562 Pain in left knee: Secondary | ICD-10-CM | POA: Insufficient documentation

## 2023-05-31 DIAGNOSIS — M25551 Pain in right hip: Secondary | ICD-10-CM | POA: Insufficient documentation

## 2023-05-31 DIAGNOSIS — M542 Cervicalgia: Secondary | ICD-10-CM | POA: Insufficient documentation

## 2023-05-31 DIAGNOSIS — G894 Chronic pain syndrome: Secondary | ICD-10-CM | POA: Diagnosis present

## 2023-05-31 DIAGNOSIS — Z79891 Long term (current) use of opiate analgesic: Secondary | ICD-10-CM | POA: Diagnosis present

## 2023-05-31 DIAGNOSIS — M79604 Pain in right leg: Secondary | ICD-10-CM | POA: Insufficient documentation

## 2023-05-31 MED ORDER — HYDROCODONE-ACETAMINOPHEN 10-325 MG PO TABS: 0.5000 | ORAL_TABLET | Freq: Two times a day (BID) | ORAL | 0 refills | Status: DC | PRN

## 2023-05-31 NOTE — Progress Notes (Signed)
Nursing Pain Medication Assessment:  Safety precautions to be maintained throughout the outpatient stay will include: orient to surroundings, keep bed in low position, maintain call bell within reach at all times, provide assistance with transfer out of bed and ambulation.  Medication Inspection Compliance: Pill count conducted under aseptic conditions, in front of the patient. Neither the pills nor the bottle was removed from the patient's sight at any time. Once count was completed pills were immediately returned to the patient in their original bottle.  Medication: Hydrocodone/APAP Pill/Patch Count:  10.5 of 30 pills remain Pill/Patch Appearance: Markings consistent with prescribed medication Bottle Appearance: Standard pharmacy container. Clearly labeled. Filled Date: 7 / 8 / 2024 Last Medication intake:  Today

## 2023-05-31 NOTE — Progress Notes (Signed)
PROVIDER NOTE: Information contained herein reflects review and annotations entered in association with encounter. Interpretation of such information and data should be left to medically-trained personnel. Information provided to patient can be located elsewhere in the medical record under "Patient Instructions". Document created using STT-dictation technology, any transcriptional errors that may result from process are unintentional.    Patient: Stephanie [name]  Service Category: E/M  Provider: Oswaldo Done, MD  DOB: 1961/04/24  DOS: 05/31/2023  Referring Provider: Luciana Axe, NP  MRN: [id number]  Specialty: Interventional Pain Management  PCP: Luciana Axe, NP  Type: Established Patient  Setting: Ambulatory outpatient    Location: Office  Delivery: Face-to-face     HPI  [name], a 62 y.o. year old female, is here today because of her Chronic pain syndrome [G89.4]. Ms. Smaltz primary complain today is Back Pain  Pertinent problems: Ms. 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); Musculoskeletal pain; Neurogenic pain; Spasm of paraspinal muscle; Chronic hip pain (Bilateral) (R>L); Osteoarthritis of knee (Bilateral); Osteoarthritis of hip (Bilateral); Osteoarthritis of sacroiliac joint (Bilateral); Chronic sacroiliac joint pain (Bilateral); Chronic pain syndrome; Failed back surgical syndrome; Eczema of hands (Bilateral) (R>L); Abnormal MRI, knee (04/04/2018); 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 Pain Assessment: Severity of   is reported as a 5 /10. Location: Back Lower/hips bilateral and down back of leg leg to top of foot effects great toe and one beside it toes. Onset: More than a month ago. Quality: Aching, Numbness, Sore, Stabbing, Discomfort, Tingling. Timing: Constant. Modifying factor(s): rest, meds. Vitals:  height is 5\' 7"  (1.702 m) and weight is 160 lb (72.6 kg). Her temperature is 97.2 F (36.2 C) (abnormal). Her blood pressure is 122/72 and her pulse is 77. Her respiration is 16 and oxygen saturation is 96%.  BMI: Estimated body mass index is 25.06 kg/m as calculated from the following:   Height as of this encounter: 5\' 7"  (1.702 m).   Weight as of this encounter: 160 lb (72.6 kg). Last encounter: 03/03/2023. Last procedure: Visit date not found.  Reason for encounter: medication management.  The patient indicates doing well with the current medication regimen. No adverse reactions or side effects reported to the medications.   RTCB: 09/02/2023   Pharmacotherapy Assessment  Analgesic: Hydrocodone/acetaminophen 10/325 mg, 0.5 tab PO BID (10 mg/day of hydrocodone) MME/day: 10 mg/day.   Monitoring: [place] PMP: PDMP reviewed during this encounter.       Pharmacotherapy: No side-effects or adverse reactions reported. Compliance: No problems identified. Effectiveness: Clinically acceptable.  Newman Pies, RN  05/31/2023  8:31 AM  Sign when Signing Visit Nursing Pain Medication Assessment:  Safety precautions to be maintained throughout the outpatient stay will include: orient to surroundings, keep bed in low position, maintain call bell within reach at all  times, provide assistance with transfer out of bed and ambulation.  Medication Inspection Compliance: Pill count conducted under aseptic conditions, in front of the patient. Neither the pills nor the bottle was removed from the patient's sight at any time. Once count was completed pills were immediately returned to the patient in their original bottle.  Medication: Hydrocodone/APAP Pill/Patch Count:  10.5 of 30 pills remain Pill/Patch Appearance: Markings consistent with prescribed medication Bottle Appearance: Standard pharmacy container. Clearly labeled. Filled Date: 7 / 8 / 2024 Last Medication intake:  Today    No results found for: "CBDTHCR" No results found for: "D8THCCBX" No results found for: "D9THCCBX"  UDS:  Summary  Date Value Ref Range Status  03/03/2023 Note  Final    Comment:    ==================================================================== [facility] 13 (MW) ==================================================================== Test                             Result       Flag       Units  Drug Present and Declared for Prescription Verification   Hydrocodone                    1659         EXPECTED   ng/mg creat   Hydromorphone                  221          EXPECTED   ng/mg creat   Dihydrocodeine                 207          EXPECTED   ng/mg creat   Norhydrocodone                 2319         EXPECTED   ng/mg creat    Sources of hydrocodone include scheduled prescription medications.    Hydromorphone, dihydrocodeine and norhydrocodone are expected    metabolites of hydrocodone. Hydromorphone and dihydrocodeine are    also available as scheduled prescription medications.  ==================================================================== Test                      Result    Flag   Units      Ref Range   Creatinine              70               mg/dL      >=29 ==================================================================== Declared Medications:  The  flagging and interpretation on this report are based on the  following declared medications.  Unexpected results may arise from  inaccuracies in the declared medications.   **Note: The testing scope of this panel includes these medications:   Hydrocodone (Norco)   **Note: The testing scope of this panel does not include the  following reported medications:   Acetaminophen (Norco)  Aspirin  Cholecalciferol  Cyclobenzaprine (Flexeril)  Dulaglutide (Trulicity)  Escitalopram (Lexapro)  Gabapentin (Neurontin)  Ibuprofen (Advil)  Lurasidone (Latuda)  Melatonin  Meloxicam (Mobic)  Naloxone (Narcan)  Prednisone (Deltasone)  Triamcinolone (Kenalog)  Vitamin B12 ==================================================================== For clinical consultation, please call [phone]. ====================================================================       ROS  Constitutional: Denies any fever or chills Gastrointestinal: No reported hemesis, hematochezia, vomiting, or acute GI distress Musculoskeletal: Denies any acute onset joint swelling, redness, loss of ROM, or weakness Neurological: No reported episodes of acute onset apraxia, aphasia, dysarthria, agnosia, amnesia, paralysis, loss  of coordination, or loss of consciousness  Medication Review  Cholecalciferol, Dulaglutide, HYDROcodone-acetaminophen, Melatonin, aspirin EC, cyanocobalamin, cyclobenzaprine, escitalopram, gabapentin, ibuprofen, lurasidone, meloxicam, naloxone, and triamcinolone ointment  History Review  Allergy: Ms. Grunder is allergic to phenergan [promethazine hcl]. Drug: Ms. Turnquist  reports no history of drug use. Alcohol:  reports no history of alcohol use. Tobacco:  reports that she has never smoked. She has never used smokeless tobacco. Social: Ms. Coltrain  reports that she has never smoked. She has never used smokeless tobacco. She reports that she does not drink alcohol and does not use drugs. Medical:   has a past medical history of Allergy, Anemia, iron deficiency (04/09/2014), Arthritis, Atypical chest pain (04/09/2014), CD (contact dermatitis) (03/01/2014), Chest pain (04/09/2014), Chronic pain syndrome, Degenerative disc disease, lumbar, Depression, Diabetes mellitus without complication (HCC), Lumbar radicular pain (08/22/2015), Lumbar spinal stenosis, Neuromuscular disorder (HCC), Osteoarthritis, Vitamin B12 deficiency, and Vitamin D deficiency. Surgical: Ms. Hattery  has a past surgical history that includes Lumbar spine surgery (2010); Appendectomy (2015); Cholecystectomy (2014); Cesarean section (1990); Bunionectomy (Bilateral, 2014); Cesarean section (1991); Knee arthroscopy (Right, 2014); Knee Arthroplasty (Right, 12/07/2022); and Knee arthroscopy (Right, 03/10/2023). Family: family history includes Asthma in her father; Stroke in her mother.  Laboratory Chemistry Profile   Renal Lab Results  Component Value Date   BUN 14 11/24/2022   CREATININE 0.66 11/24/2022   GFRAA >60 09/01/2019   GFRNONAA >60 11/24/2022    Hepatic Lab Results  Component Value Date   AST 16 11/24/2022   ALT 11 11/24/2022   ALBUMIN 4.1 11/24/2022   ALKPHOS 48 11/24/2022    Electrolytes Lab Results  Component Value Date   NA 138 11/24/2022   K 3.5 11/24/2022   CL 109 11/24/2022   CALCIUM 8.7 (L) 11/24/2022   MG 1.9 09/01/2019    Bone Lab Results  Component Value Date   VD25OH 33.18 09/01/2019   25OHVITD1 34 05/07/2016   25OHVITD2 <1.0 05/07/2016   25OHVITD3 34 05/07/2016    Inflammation (CRP: Acute Phase) (ESR: Chronic Phase) Lab Results  Component Value Date   CRP 0.9 03/09/2023   ESRSEDRATE 5 03/09/2023         Note: Above Lab results reviewed.  Recent Imaging Review  DG Knee Right Port CLINICAL DATA:  232140 Total knee replacement status 232140  EXAM: PORTABLE RIGHT KNEE - 1-2 VIEW  COMPARISON:  12/21/2017  FINDINGS: Interval postsurgical changes from right total knee  arthroplasty. Arthroplasty components are in their expected alignment. No periprosthetic fracture or evidence of other complication. Expected postoperative changes within the overlying soft tissues.  IMPRESSION: Status post right total knee arthroplasty without evidence of complication.  Electronically Signed   By: Duanne Guess D.O.   On: 12/07/2022 14:59 Note: Reviewed        Physical Exam  General appearance: Well nourished, well developed, and well hydrated. In no apparent acute distress Mental status: Alert, oriented x 3 (person, place, & time)       Respiratory: No evidence of acute respiratory distress Eyes: PERLA Vitals: BP 122/72   Pulse 77   Temp (!) 97.2 F (36.2 C)   Resp 16   Ht 5\' 7"  (1.702 m)   Wt 160 lb (72.6 kg)   LMP  (LMP Unknown)   SpO2 96%   BMI 25.06 kg/m  BMI: Estimated body mass index is 25.06 kg/m as calculated from the following:   Height as of this encounter: 5\' 7"  (1.702 m).   Weight as of this  encounter: 160 lb (72.6 kg). Ideal: Ideal body weight: 61.6 kg (135 lb 12.9 oz) Adjusted ideal body weight: 66 kg (145 lb 7.7 oz)  Assessment   Diagnosis Status  1. Chronic pain syndrome   2. Chronic low back pain (1ry area of Pain) (Bilateral) (L>R)   3. Chronic lower extremity pain (2ry area of Pain) (Bilateral) (L>R)   4. Chronic knee pain (3ry area of Pain) (Bilateral) (R>L)   5. Cervicalgia   6. Failed back surgical syndrome   7. Chronic upper extremity pain (Bilateral)   8. Chronic hip pain (Bilateral) (R>L)   9. Pharmacologic therapy   10. Chronic use of opiate for therapeutic purpose   11. Encounter for medication management   12. Encounter for chronic pain management    Controlled Controlled Controlled   Updated Problems: No problems updated.  Plan of Care  Problem-specific:  No problem-specific Assessment & Plan notes found for this encounter.  Ms. Levinia Harrell has a current medication list which includes the  following long-term medication(s): escitalopram, [START ON 06/04/2023] hydrocodone-acetaminophen, [START ON 07/04/2023] hydrocodone-acetaminophen, [START ON 08/03/2023] hydrocodone-acetaminophen, cyclobenzaprine, gabapentin, meloxicam, and triamcinolone ointment.  Pharmacotherapy (Medications Ordered): Meds ordered this encounter  Medications   HYDROcodone-acetaminophen (NORCO) 10-325 MG tablet    Sig: Take 0.5 tablets by mouth every 12 (twelve) hours as needed for severe pain. Must last 30 days    Dispense:  30 tablet    Refill:  0    DO NOT: delete (not duplicate); no partial-fill (will deny script to complete), no refill request (F/U required). DISPENSE: 1 day early if closed on fill date. WARN: No CNS-depressants within 8 hrs of med.   HYDROcodone-acetaminophen (NORCO) 10-325 MG tablet    Sig: Take 0.5 tablets by mouth every 12 (twelve) hours as needed for severe pain. Must last 30 days    Dispense:  30 tablet    Refill:  0    DO NOT: delete (not duplicate); no partial-fill (will deny script to complete), no refill request (F/U required). DISPENSE: 1 day early if closed on fill date. WARN: No CNS-depressants within 8 hrs of med.   HYDROcodone-acetaminophen (NORCO) 10-325 MG tablet    Sig: Take 0.5 tablets by mouth every 12 (twelve) hours as needed for severe pain. Must last 30 days    Dispense:  30 tablet    Refill:  0    DO NOT: delete (not duplicate); no partial-fill (will deny script to complete), no refill request (F/U required). DISPENSE: 1 day early if closed on fill date. WARN: No CNS-depressants within 8 hrs of med.   Orders:  No orders of the defined types were placed in this encounter.  Follow-up plan:   Return in about 3 months (around 09/02/2023) for Eval-day (M,W), (F2F), (MM).      Interventional Therapies  Risk  Complexity Considerations:   Estimated body mass index is 27.25 kg/m as calculated from the following:   Height as of this encounter: 5\' 7"  (1.702 m).   Weight  as of this encounter: 174 lb (78.9 kg). WNL   Planned  Pending:   Diagnostic right caudal ESI #2    Under consideration:   Diagnostic bilateral lumbar facet MBB Diagnostic Caudal ESI Diagnostic Bilateral L4-5 & L5-S1Lumbar TFESI Diagnostic bilateral sacroiliac joint Blk Diagnostic bilateral hip joint injection Diagnostic bilateral genicular NB Possible Racz procedure    Completed:   Palliative Caudal ESI x1 (02/01/2017) (100/100/80/80)   Completed by other providers:   Surgery: Right total knee  replacement by Dr. Francesco Sor (12/07/2022)  Therapeutic right knee IA steroid inj. x6 (05/14/2020, 07/22/2020, 08/12/2020, 09/15/2021, 12/29/2021, 04/07/2022) by Lurline Hare, PA-C  Therapeutic left knee IA steroid inj. x4 (04/29/2020, 07/22/2020, 08/12/2020, 09/29/2021) by Lurline Hare, PA-C    Therapeutic  Palliative (PRN) options:   Palliative Caudal ESI #2    Pharmacotherapy  Nonopioids transferred 10/07/2020: Mobic, Neurontin, Flexeril, melatonin, and triamcinolone ointment.        Recent Visits Date Type Provider Dept  03/03/23 Office Visit Delano Metz, MD [facility]  Showing recent visits within past 90 days and meeting all other requirements Today's Visits Date Type Provider Dept  05/31/23 Office Visit Delano Metz, MD [facility]  Showing today's visits and meeting all other requirements Future Appointments No visits were found meeting these conditions. Showing future appointments within next 90 days and meeting all other requirements  I discussed the assessment and treatment plan with the patient. The patient was provided an opportunity to ask questions and all were answered. The patient agreed with the plan and demonstrated an understanding of the instructions.  Patient advised to call back or seek an in-person evaluation if the symptoms or condition worsens.  Duration of encounter: 30 minutes.  Total time on encounter, as per AMA guidelines  included both the face-to-face and non-face-to-face time personally spent by the physician and/or other qualified health care professional(s) on the day of the encounter (includes time in activities that require the physician or other qualified health care professional and does not include time in activities normally performed by clinical staff). Physician's time may include the following activities when performed: Preparing to see the patient (e.g., pre-charting review of records, searching for previously ordered imaging, lab work, and nerve conduction tests) Review of prior analgesic pharmacotherapies. Reviewing PMP Interpreting ordered tests (e.g., lab work, imaging, nerve conduction tests) Performing post-procedure evaluations, including interpretation of diagnostic procedures Obtaining and/or reviewing separately obtained history Performing a medically appropriate examination and/or evaluation Counseling and educating the patient/family/caregiver Ordering medications, tests, or procedures Referring and communicating with other health care professionals (when not separately reported) Documenting clinical information in the electronic or other health record Independently interpreting results (not separately reported) and communicating results to the patient/ family/caregiver Care coordination (not separately reported)  Note by: Oswaldo Done, MD Date: 05/31/2023; Time: 8:34 AM

## 2023-05-31 NOTE — Patient Instructions (Signed)
____________________________________________________________________________________________  Opioid Pain Medication Update  To: All patients taking opioid pain medications. (I.e.: hydrocodone, hydromorphone, oxycodone, oxymorphone, morphine, codeine, methadone, tapentadol, tramadol, buprenorphine, fentanyl, etc.)  Re: Updated review of side effects and adverse reactions of opioid analgesics, as well as new information about long term effects of this class of medications.  Direct risks of long-term opioid therapy are not limited to opioid addiction and overdose. Potential medical risks include serious fractures, breathing problems during sleep, hyperalgesia, immunosuppression, chronic constipation, bowel obstruction, myocardial infarction, and tooth decay secondary to xerostomia.  Unpredictable adverse effects that can occur even if you take your medication correctly: Cognitive impairment, respiratory depression, and death. Most people think that if they take their medication "correctly", and "as instructed", that they will be safe. Nothing could be farther from the truth. In reality, a significant amount of recorded deaths associated with the use of opioids has occurred in individuals that had taken the medication for a long time, and were taking their medication correctly. The following are examples of how this can happen: Patient taking his/her medication for a long time, as instructed, without any side effects, is given a certain antibiotic or another unrelated medication, which in turn triggers a "Drug-to-drug interaction" leading to disorientation, cognitive impairment, impaired reflexes, respiratory depression or an untoward event leading to serious bodily harm or injury, including death.  Patient taking his/her medication for a long time, as instructed, without any side effects, develops an acute impairment of liver and/or kidney function. This will lead to a rapid inability of the body to  breakdown and eliminate their pain medication, which will result in effects similar to an "overdose", but with the same medicine and dose that they had always taken. This again may lead to disorientation, cognitive impairment, impaired reflexes, respiratory depression or an untoward event leading to serious bodily harm or injury, including death.  A similar problem will occur with patients as they grow older and their liver and kidney function begins to decrease as part of the aging process.  Background information: Historically, the original case for using long-term opioid therapy to treat chronic noncancer pain was based on safety assumptions that subsequent experience has called into question. In 1996, the American Pain Society and the American Academy of Pain Medicine issued a consensus statement supporting long-term opioid therapy. This statement acknowledged the dangers of opioid prescribing but concluded that the risk for addiction was low; respiratory depression induced by opioids was short-lived, occurred mainly in opioid-naive patients, and was antagonized by pain; tolerance was not a common problem; and efforts to control diversion should not constrain opioid prescribing. This has now proven to be wrong. Experience regarding the risks for opioid addiction, misuse, and overdose in community practice has failed to support these assumptions.  According to the Centers for Disease Control and Prevention, fatal overdoses involving opioid analgesics have increased sharply over the past decade. Currently, more than 96,700 people die from drug overdoses every year. Opioids are a factor in 7 out of every 10 overdose deaths. Deaths from drug overdose have surpassed motor vehicle accidents as the leading cause of death for individuals between the ages of 80 and 61.  Clinical data suggest that neuroendocrine dysfunction may be very common in both men and women, potentially causing hypogonadism, erectile  dysfunction, infertility, decreased libido, osteoporosis, and depression. Recent studies linked higher opioid dose to increased opioid-related mortality. Controlled observational studies reported that long-term opioid therapy may be associated with increased risk for cardiovascular events. Subsequent meta-analysis concluded  that the safety of long-term opioid therapy in elderly patients has not been proven.   Side Effects and adverse reactions: Common side effects: Drowsiness (sedation). Dizziness. Nausea and vomiting. Constipation. Physical dependence -- Dependence often manifests with withdrawal symptoms when opioids are discontinued or decreased. Tolerance -- As you take repeated doses of opioids, you require increased medication to experience the same effect of pain relief. Respiratory depression -- This can occur in healthy people, especially with higher doses. However, people with COPD, asthma or other lung conditions may be even more susceptible to fatal respiratory impairment.  Uncommon side effects: An increased sensitivity to feeling pain and extreme response to pain (hyperalgesia). Chronic use of opioids can lead to this. Delayed gastric emptying (the process by which the contents of your stomach are moved into your small intestine). Muscle rigidity. Immune system and hormonal dysfunction. Quick, involuntary muscle jerks (myoclonus). Arrhythmia. Itchy skin (pruritus). Dry mouth (xerostomia).  Long-term side effects: Chronic constipation. Sleep-disordered breathing (SDB). Increased risk of bone fractures. Hypothalamic-pituitary-adrenal dysregulation. Increased risk of overdose.  RISKS: Respiratory depression and death: Opioids increase the risk of respiratory depression and death.  Drug-to-drug interactions: Opioids are relatively contraindicated in combination with benzodiazepines, sleep inducers, and other central nervous system depressants. Other classes of medications  (i.e.: certain antibiotics and even over-the-counter medications) may also trigger or induce respiratory depression in some patients.  Medical conditions: Patients with pre-existing respiratory problems are at higher risk of respiratory failure and/or depression when in combination with opioid analgesics. Opioids are relatively contraindicated in some medical conditions such as central sleep apnea.   Fractures and Falls:  Opioids increase the risk and incidence of falls. This is of particular importance in elderly patients.  Endocrine System:  Long-term administration is associated with endocrine abnormalities (endocrinopathies). (Also known as Opioid-induced Endocrinopathy) Influences on both the hypothalamic-pituitary-adrenal axis?and the hypothalamic-pituitary-gonadal axis have been demonstrated with consequent hypogonadism and adrenal insufficiency in both sexes. Hypogonadism and decreased levels of dehydroepiandrosterone sulfate have been reported in men and women. Endocrine effects include: Amenorrhoea in women (abnormal absence of menstruation) Reduced libido in both sexes Decreased sexual function Erectile dysfunction in men Hypogonadisms (decreased testicular function with shrinkage of testicles) Infertility Depression and fatigue Loss of muscle mass Anxiety Depression Immune suppression Hyperalgesia Weight gain Anemia Osteoporosis Patients (particularly women of childbearing age) should avoid opioids. There is insufficient evidence to recommend routine monitoring of asymptomatic patients taking opioids in the long-term for hormonal deficiencies.  Immune System: Human studies have demonstrated that opioids have an immunomodulating effect. These effects are mediated via opioid receptors both on immune effector cells and in the central nervous system. Opioids have been demonstrated to have adverse effects on antimicrobial response and anti-tumour surveillance. Buprenorphine has  been demonstrated to have no impact on immune function.  Opioid Induced Hyperalgesia: Human studies have demonstrated that prolonged use of opioids can lead to a state of abnormal pain sensitivity, sometimes called opioid induced hyperalgesia (OIH). Opioid induced hyperalgesia is not usually seen in the absence of tolerance to opioid analgesia. Clinically, hyperalgesia may be diagnosed if the patient on long-term opioid therapy presents with increased pain. This might be qualitatively and anatomically distinct from pain related to disease progression or to breakthrough pain resulting from development of opioid tolerance. Pain associated with hyperalgesia tends to be more diffuse than the pre-existing pain and less defined in quality. Management of opioid induced hyperalgesia requires opioid dose reduction.  Cancer: Chronic opioid therapy has been associated with an increased risk of cancer  among noncancer patients with chronic pain. This association was more evident in chronic strong opioid users. Chronic opioid consumption causes significant pathological changes in the small intestine and colon. Epidemiological studies have found that there is a link between opium dependence and initiation of gastrointestinal cancers. Cancer is the second leading cause of death after cardiovascular disease. Chronic use of opioids can cause multiple conditions such as GERD, immunosuppression and renal damage as well as carcinogenic effects, which are associated with the incidence of cancers.   Mortality: Long-term opioid use has been associated with increased mortality among patients with chronic non-cancer pain (CNCP).  Prescription of long-acting opioids for chronic noncancer pain was associated with a significantly increased risk of all-cause mortality, including deaths from causes other than overdose.  Reference: Von Korff M, Kolodny A, Deyo RA, Chou R. Long-term opioid therapy reconsidered. Ann Intern Med. 2011  Sep 6;155(5):325-8. doi: 10.7326/[id number]. PMID: 64403474; PMCID: [id number]. Randon Goldsmith, Hayward RA, Dunn KM, [place] KP. Risk of adverse events in patients prescribed long-term opioids: A cohort study in the [place] [facility] Research Datalink. Eur J Pain. 2019 May;23(5):908-922. doi: 10.1002/ejp.1357. Epub 2019 Jan 31. PMID: 43329518. Colameco S, Coren JS, Ciervo CA. Continuous opioid treatment for chronic noncancer pain: a time for moderation in prescribing. Postgrad Med. 2009 Jul;121(4):61-6. doi: 10.3810/pgm.2009.07.2032. PMID: 84166063. William Hamburger RN, [place] SD, Blazina I, Cristopher Peru, Bougatsos C, Deyo RA. The effectiveness and risks of long-term opioid therapy for chronic pain: a systematic review for a [employer] of [employer] Pathways to [employer]. Ann Intern Med. 2015 Feb 17;162(4):276-86. doi: 10.7326/M14-2559. PMID: 01601093. Caryl Bis [facility], Makuc DM. NCHS Data Brief No. 22. [place]: Centers for Disease Control and Prevention; 2009. Sep, Increase in Fatal Poisonings Involving Opioid Analgesics in the [place], 1999-2006. Song IA, Choi HR, Oh TK. Long-term opioid use and mortality in patients with chronic non-cancer pain: Ten-year follow-up study in [place] from 2010 through 2019. EClinicalMedicine. 2022 Jul 18;51:101558. doi: 10.1016/j.eclinm.2022.235573. PMID: 22025427; PMCID: [id number]. Huser, W., Schubert, T., Vogelmann, T. et al. All-cause mortality in patients with long-term opioid therapy compared with non-opioid analgesics for chronic non-cancer pain: a database study. BMC Med 18, 162 (2020). http://lester.info/ Rashidian H, Karie Kirks, Malekzadeh R, Haghdoost AA. An Ecological Study of the Association between Opiate Use and Incidence of Cancers. Addict Health. 2016 Fall;8(4):252-260. PMID: 51761607; PMCID: [id number].  Our Goal: Our goal is to control your  pain with means other than the use of opioid pain medications.  Our Recommendation: Talk to your physician about coming off of these medications. We can assist you with the tapering down and stopping these medicines. Based on the new information, even if you cannot completely stop the medication, a decrease in the dose may be associated with a lesser risk. Ask for other means of controlling the pain. Decrease or eliminate those factors that significantly contribute to your pain such as smoking, obesity, and a diet heavily tilted towards "inflammatory" nutrients.  Last Updated: 05/10/2023   ____________________________________________________________________________________________     ____________________________________________________________________________________________  National Pain Medication Shortage  The [place] is experiencing worsening drug shortages. These have had a negative widespread effect on patient care and treatment. Not expected to improve any time soon. Predicted to last past 2029.   Drug shortage list (generic names) Oxycodone IR Oxycodone/APAP Oxymorphone IR Hydromorphone Hydrocodone/APAP Morphine  Where is the problem?  Manufacturing and supply level.  Will this shortage affect you?  Only if you  take any of the above pain medications.  How? You may be unable to fill your prescription.  Your pharmacist may offer a "partial fill" of your prescription. (Warning: Do not accept partial fills.) Prescriptions partially filled cannot be transferred to another pharmacy. Read our Medication Rules and Regulation. Depending on how much medicine you are dependent on, you may experience withdrawals when unable to get the medication.  Recommendations: Consider ending your dependence on opioid pain medications. Ask your pain specialist to assist you with the process. Consider switching to a medication currently not in shortage, such as Buprenorphine. Talk to your pain  specialist about this option. Consider decreasing your pain medication requirements by managing tolerance thru "Drug Holidays". This may help minimize withdrawals, should you run out of medicine. Control your pain thru the use of non-pharmacological interventional therapies.   Your prescriber: Prescribers cannot be blamed for shortages. Medication manufacturing and supply issues cannot be fixed by the prescriber.   NOTE: The prescriber is not responsible for supplying the medication, or solving supply issues. Work with your pharmacist to solve it. The patient is responsible for the decision to take or continue taking the medication and for identifying and securing a legal supply source. By law, supplying the medication is the job and responsibility of the pharmacy. The prescriber is responsible for the evaluation, monitoring, and prescribing of these medications.   Prescribers will NOT: Re-issue prescriptions that have been partially filled. Re-issue prescriptions already sent to a pharmacy.  Re-send prescriptions to a different pharmacy because yours did not have your medication. Ask pharmacist to order more medicine or transfer the prescription to another pharmacy. (Read below.)  New 2023 regulation: "July 03, 2022 Revised Regulation Allows DEA-Registered Pharmacies to Transfer Electronic Prescriptions at a Patient's Request DEA Headquarters Division - Public Information Office Patients now have the ability to request their electronic prescription be transferred to another pharmacy without having to go back to their practitioner to initiate the request. This revised regulation went into effect on Monday, June 29, 2022.     At a patient's request, a DEA-registered retail pharmacy can now transfer an electronic prescription for a controlled substance (schedules II-V) to another DEA-registered retail pharmacy. Prior to this change, patients would have to go through their practitioner to  cancel their prescription and have it re-issued to a different pharmacy. The process was taxing and time consuming for both patients and practitioners.    The Drug Enforcement Administration [facility]) published its intent to revise the process for transferring electronic prescriptions on September 20, 2020.  The final rule was published in the federal register on May 28, 2022 and went into effect 30 days later.  Under the final rule, a prescription can only be transferred once between pharmacies, and only if allowed under existing state or other applicable law. The prescription must remain in its electronic form; may not be altered in any way; and the transfer must be communicated directly between two licensed pharmacists. It's important to note, any authorized refills transfer with the original prescription, which means the entire prescription will be filled at the same pharmacy".  Reference: [email or web address].is [facility] website announcement)  [email or web address] [employer] of Justice)   [employer] / Vol. 88, No. 143 / Thursday, May 28, 2022 / Rules and Regulations DEPARTMENT OF JUSTICE  Drug Enforcement Administration  21 CFR Part 1306  [Docket No. [id number]]  RIN [id number] Transfer of Electronic Prescriptions for Schedules II-V Controlled Substances Between Pharmacies for Initial Filling  ____________________________________________________________________________________________  ____________________________________________________________________________________________  Transfer of Pain Medication between Pharmacies  Re: 2023 DEA Clarification on existing regulation  Published on DEA Website: July 03, 2022  Title: Revised Regulation Allows DEA-Registered Pharmacies to Electrical engineer Prescriptions at a Patient's  Request DEA Headquarters Division - [employer]  "Patients now have the ability to request their electronic prescription be transferred to another pharmacy without having to go back to their practitioner to initiate the request. This revised regulation went into effect on Monday, June 29, 2022.     At a patient's request, a DEA-registered retail pharmacy can now transfer an electronic prescription for a controlled substance (schedules II-V) to another DEA-registered retail pharmacy. Prior to this change, patients would have to go through their practitioner to cancel their prescription and have it re-issued to a different pharmacy. The process was taxing and time consuming for both patients and practitioners.    The Drug Enforcement Administration [facility]) published its intent to revise the process for transferring electronic prescriptions on September 20, 2020.  The final rule was published in the federal register on May 28, 2022 and went into effect 30 days later.  Under the final rule, a prescription can only be transferred once between pharmacies, and only if allowed under existing state or other applicable law. The prescription must remain in its electronic form; may not be altered in any way; and the transfer must be communicated directly between two licensed pharmacists. It's important to note, any authorized refills transfer with the original prescription, which means the entire prescription will be filled at the same pharmacy."    REFERENCES: 1. DEA website announcement [email or web address]  2. Department of Justice website  [email or web address]  3. DEPARTMENT OF JUSTICE Drug Enforcement Administration 21 CFR Part 1306 [Docket No. [id number]] RIN [id number] "Transfer of Electronic Prescriptions for Schedules II-V Controlled Substances  Between Pharmacies for Initial Filling"  ____________________________________________________________________________________________     _______________________________________________________________________  Medication Rules  Purpose: To inform patients, and their family members, of our medication rules and regulations.  Applies to: All patients receiving prescriptions from our practice (written or electronic).  Pharmacy of record: This is the pharmacy where your electronic prescriptions will be sent. Make sure we have the correct one.  Electronic prescriptions: In compliance with the [facility] Strengthen Opioid Misuse Prevention (STOP) Act of 2017 (Session Conni Elliot [phone]), effective November 02, 2018, all controlled substances must be electronically prescribed. Written prescriptions, faxing, or calling prescriptions to a pharmacy will no longer be done.  Prescription refills: These will be provided only during in-person appointments. No medications will be renewed without a "face-to-face" evaluation with your provider. Applies to all prescriptions.  NOTE: The following applies primarily to controlled substances (Opioid* Pain Medications).   Type of encounter (visit): For patients receiving controlled substances, face-to-face visits are required. (Not an option and not up to the patient.)  Patient's responsibilities: Pain Pills: Bring all pain pills to every appointment (except for procedure appointments). Pill Bottles: Bring pills in original pharmacy bottle. Bring bottle, even if empty. Always bring the bottle of the most recent fill.  Medication refills: You are responsible for knowing and keeping track of what medications you are taking and when is it that you will need a refill. The day before your appointment: write a list of all prescriptions that need to be refilled. The day of the appointment: give the list to the admitting nurse. Prescriptions will be written only  during appointments. No prescriptions will be written on procedure days. If you forget a  medication: it will not be "Called in", "Faxed", or "electronically sent". You will need to get another appointment to get these prescribed. No early refills. Do not call asking to have your prescription filled early. Partial  or short prescriptions: Occasionally your pharmacy may not have enough pills to fill your prescription.  NEVER ACCEPT a partial fill or a prescription that is short of the total amount of pills that you were prescribed.  With controlled substances the law allows 72 hours for the pharmacy to complete the prescription.  If the prescription is not completed within 72 hours, the pharmacist will require a new prescription to be written. This means that you will be short on your medicine and we WILL NOT send another prescription to complete your original prescription.  Instead, request the pharmacy to send a carrier to a nearby branch to get enough medication to provide you with your full prescription. Prescription Accuracy: You are responsible for carefully inspecting your prescriptions before leaving our office. Have the discharge nurse carefully go over each prescription with you, before taking them home. Make sure that your name is accurately spelled, that your address is correct. Check the name and dose of your medication to make sure it is accurate. Check the number of pills, and the written instructions to make sure they are clear and accurate. Make sure that you are given enough medication to last until your next medication refill appointment. Taking Medication: Take medication as prescribed. When it comes to controlled substances, taking less pills or less frequently than prescribed is permitted and encouraged. Never take more pills than instructed. Never take the medication more frequently than prescribed.  Inform other Doctors: Always inform, all of your healthcare providers, of all the  medications you take. Pain Medication from other Providers: You are not allowed to accept any additional pain medication from any other Doctor or Healthcare provider. There are two exceptions to this rule. (see below) In the event that you require additional pain medication, you are responsible for notifying us, as stated below. Cough Medicine: Often these contain an opioid, such as codeine or hydrocodone. Never accept or take cough medicine containing these opioids if you are already taking an opioid* medication. The combination may cause respiratory failure and death. Medication Agreement: You are responsible for carefully reading and following our Medication Agreement. This must be signed before receiving any prescriptions from our practice. Safely store a copy of your signed Agreement. Violations to the Agreement will result in no further prescriptions. (Additional copies of our Medication Agreement are available upon request.) Laws, Rules, & Regulations: All patients are expected to follow all [address] and [employer], [employer], Rules, [place] [employer]. Ignorance of the Laws does not constitute a valid excuse.  Illegal drugs and Controlled Substances: The use of illegal substances (including, but not limited to marijuana and its derivatives) and/or the illegal use of any controlled substances is strictly prohibited. Violation of this rule may result in the immediate and permanent discontinuation of any and all prescriptions being written by our practice. The use of any illegal substances is prohibited. Adopted CDC guidelines & recommendations: Target dosing levels will be at or below 60 MME/day. Use of benzodiazepines** is not recommended.  Exceptions: There are only two exceptions to the rule of not receiving pain medications from other Healthcare Providers. Exception #1 (Emergencies): In the event of an emergency (i.e.: accident requiring emergency care), you are allowed to receive additional pain  medication. However, you are responsible for: As soon as  you are able, call our office [phone], at any time of the day or night, and leave a message stating your name, the date and nature of the emergency, and the name and dose of the medication prescribed. In the event that your call is answered by a member of our staff, make sure to document and save the date, time, and the name of the person that took your information.  Exception #2 (Planned Surgery): In the event that you are scheduled by another doctor or dentist to have any type of surgery or procedure, you are allowed (for a period no longer than 30 days), to receive additional pain medication, for the acute post-op pain. However, in this case, you are responsible for picking up a copy of our "Post-op Pain Management for Surgeons" handout, and giving it to your surgeon or dentist. This document is available at our office, and does not require an appointment to obtain it. Simply go to our office during business hours (Monday-Thursday from 8:00 AM to 4:00 PM) (Friday 8:00 AM to 12:00 Noon) or if you have a scheduled appointment with [place], prior to your surgery, and ask for it by name. In addition, you are responsible for: calling our office [phone], at any time of the day or night, and leaving a message stating your name, name of your surgeon, type of surgery, and date of procedure or surgery. Failure to comply with your responsibilities may result in termination of therapy involving the controlled substances. Medication Agreement Violation. Following the above rules, including your responsibilities will help you in avoiding a Medication Agreement Violation ("Breaking your Pain Medication Contract").  Consequences:  Not following the above rules may result in permanent discontinuation of medication prescription therapy.  *Opioid medications include: morphine, codeine, oxycodone, oxymorphone, hydrocodone, hydromorphone, meperidine, tramadol,  tapentadol, buprenorphine, fentanyl, methadone. **Benzodiazepine medications include: diazepam (Valium), alprazolam (Xanax), clonazepam (Klonopine), lorazepam (Ativan), clorazepate (Tranxene), chlordiazepoxide (Librium), estazolam (Prosom), oxazepam (Serax), temazepam (Restoril), triazolam (Halcion) (Last updated: 08/25/2022) ______________________________________________________________________    ______________________________________________________________________  Medication Recommendations and Reminders  Applies to: All patients receiving prescriptions (written and/or electronic).  Medication Rules & Regulations: You are responsible for reading, knowing, and following our "Medication Rules" document. These exist for your safety and that of others. They are not flexible and neither are we. Dismissing or ignoring them is an act of "non-compliance" that may result in complete and irreversible termination of such medication therapy. For safety reasons, "non-compliance" will not be tolerated. As with the [place] fundamental legal principle of "ignorance of the law is no defense", we will accept no excuses for not having read and knowing the content of documents provided to you by our practice.  Pharmacy of record:  Definition: This is the pharmacy where your electronic prescriptions will be sent.  We do not endorse any particular pharmacy. It is up to you and your insurance to decide what pharmacy to use.  We do not restrict you in your choice of pharmacy. However, once we write for your prescriptions, we will NOT be re-sending more prescriptions to fix restricted supply problems created by your pharmacy, or your insurance.  The pharmacy listed in the electronic medical record should be the one where you want electronic prescriptions to be sent. If you choose to change pharmacy, simply notify our nursing staff. Changes will be made only during your regular appointments and not over the  phone.  Recommendations: Keep all of your pain medications in a safe place, under lock and key, even  if you live alone. We will NOT replace lost, stolen, or damaged medication. We do not accept "Police Reports" as proof of medications having been stolen. After you fill your prescription, take 1 week's worth of pills and put them away in a safe place. You should keep a separate, properly labeled bottle for this purpose. The remainder should be kept in the original bottle. Use this as your primary supply, until it runs out. Once it's gone, then you know that you have 1 week's worth of medicine, and it is time to come in for a prescription refill. If you do this correctly, it is unlikely that you will ever run out of medicine. To make sure that the above recommendation works, it is very important that you make sure your medication refill appointments are scheduled at least 1 week before you run out of medicine. To do this in an effective manner, make sure that you do not leave the office without scheduling your next medication management appointment. Always ask the nursing staff to show you in your prescription , when your medication will be running out. Then arrange for the receptionist to get you a return appointment, at least 7 days before you run out of medicine. Do not wait until you have 1 or 2 pills left, to come in. This is very poor planning and does not take into consideration that we may need to cancel appointments due to bad weather, sickness, or emergencies affecting our staff. DO NOT ACCEPT A "Partial Fill": If for any reason your pharmacy does not have enough pills/tablets to completely fill or refill your prescription, do not allow for a "partial fill". The law allows the pharmacy to complete that prescription within 72 hours, without requiring a new prescription. If they do not fill the rest of your prescription within those 72 hours, you will need a separate prescription to fill the remaining  amount, which we will NOT provide. If the reason for the partial fill is your insurance, you will need to talk to the pharmacist about payment alternatives for the remaining tablets, but again, DO NOT ACCEPT A PARTIAL FILL, unless you can trust your pharmacist to obtain the remainder of the pills within 72 hours.  Prescription refills and/or changes in medication(s):  Prescription refills, and/or changes in dose or medication, will be conducted only during scheduled medication management appointments. (Applies to both, written and electronic prescriptions.) No refills on procedure days. No medication will be changed or started on procedure days. No changes, adjustments, and/or refills will be conducted on a procedure day. Doing so will interfere with the diagnostic portion of the procedure. No phone refills. No medications will be "called into the pharmacy". No Fax refills. No weekend refills. No Holliday refills. No after hours refills.  Remember:  Business hours are:  Monday to Thursday 8:00 AM to 4:00 PM Provider's Schedule: Delano Metz, MD - Appointments are:  Medication management: Monday and Wednesday 8:00 AM to 4:00 PM Procedure day: Tuesday and Thursday 7:30 AM to 4:00 PM Edward Jolly, MD - Appointments are:  Medication management: Tuesday and Thursday 8:00 AM to 4:00 PM Procedure day: Monday and Wednesday 7:30 AM to 4:00 PM (Last update: 08/25/2022) ______________________________________________________________________   ____________________________________________________________________________________________  Naloxone Nasal Spray  Why am I receiving this medication? [place] STOP ACT requires that all patients taking high dose opioids or at risk of opioids respiratory depression, be prescribed an opioid reversal agent, such as Naloxone (AKA: Narcan).  What is this medication? NALOXONE (  nal OX one) treats opioid overdose, which causes slow or shallow breathing,  severe drowsiness, or trouble staying awake. Call emergency services after using this medication. You may need additional treatment. Naloxone works by reversing the effects of opioids. It belongs to a group of medications called opioid blockers.  COMMON BRAND NAME(S): Kloxxado, Narcan  What should I tell my care team before I take this medication? They need to know if you have any of these conditions: Heart disease Substance use disorder An unusual or allergic reaction to naloxone, other medications, foods, dyes, or preservatives Pregnant or trying to get pregnant Breast-feeding  When to use this medication? This medication is to be used for the treatment of respiratory depression (less than 8 breaths per minute) secondary to opioid overdose.   How to use this medication? This medication is for use in the nose. Lay the person on their back. Support their neck with your hand and allow the head to tilt back before giving the medication. The nasal spray should be given into 1 nostril. After giving the medication, move the person onto their side. Do not remove or test the nasal spray until ready to use. Get emergency medical help right away after giving the first dose of this medication, even if the person wakes up. You should be familiar with how to recognize the signs and symptoms of a narcotic overdose. If more doses are needed, give the additional dose in the other nostril. Talk to your care team about the use of this medication in children. While this medication may be prescribed for children as young as newborns for selected conditions, precautions do apply.  Naloxone Overdosage: If you think you have taken too much of this medicine contact a poison control center or emergency room at once.  NOTE: This medicine is only for you. Do not share this medicine with others.  What if I miss a dose? This does not apply.  What may interact with this medication? This is only used during an  emergency. No interactions are expected during emergency use. This list may not describe all possible interactions. Give your health care provider a list of all the medicines, herbs, non-prescription drugs, or dietary supplements you use. Also tell them if you smoke, drink alcohol, or use illegal drugs. Some items may interact with your medicine.  What should I watch for while using this medication? Keep this medication ready for use in the case of an opioid overdose. Make sure that you have the phone number of your care team and local hospital ready. You may need to have additional doses of this medication. Each nasal spray contains a single dose. Some emergencies may require additional doses. After use, bring the treated person to the nearest hospital or call 911. Make sure the treating care team knows that the person has received a dose of this medication. You will receive additional instructions on what to do during and after use of this medication before an emergency occurs.  What side effects may I notice from receiving this medication? Side effects that you should report to your care team as soon as possible: Allergic reactions--skin rash, itching, hives, swelling of the face, lips, tongue, or throat Side effects that usually do not require medical attention (report these to your care team if they continue or are bothersome): Constipation Dryness or irritation inside the nose Headache Increase in blood pressure Muscle spasms Stuffy nose Toothache This list may not describe all possible side effects. Call your doctor for  medical advice about side effects. You may report side effects to FDA at 1-800-FDA-1088.  Where should I keep my medication? Because this is an emergency medication, you should keep it with you at all times.  Keep out of the reach of children and pets. Store between 20 and 25 degrees C (68 and 77 degrees F). Do not freeze. Throw away any unused medication after the  expiration date. Keep in original box until ready to use.  NOTE: This sheet is a summary. It may not cover all possible information. If you have questions about this medicine, talk to your doctor, pharmacist, or health care provider.   2023 [employer]/Gold Standard (2021-06-27 00:00:00)  ____________________________________________________________________________________________

## 2023-07-08 ENCOUNTER — Other Ambulatory Visit: Payer: Self-pay | Admitting: Gerontology

## 2023-07-08 DIAGNOSIS — Z1231 Encounter for screening mammogram for malignant neoplasm of breast: Secondary | ICD-10-CM

## 2023-08-24 NOTE — Progress Notes (Unsigned)
PROVIDER NOTE: Information contained herein reflects review and annotations entered in association with encounter. Interpretation of such information and data should be left to medically-trained personnel. Information provided to patient can be located elsewhere in the medical record under "Patient Instructions". Document created using STT-dictation technology, any transcriptional errors that may result from process are unintentional.    Patient: Stephanie [name]  Service Category: E/M  Provider: Oswaldo Done, MD  DOB: 1961-10-05  DOS: 08/25/2023  Referring Provider: Luciana Axe, NP  MRN: [id number]  Specialty: Interventional Pain Management  PCP: Stephanie Axe, NP  Type: Established Patient  Setting: Ambulatory outpatient    Location: Office  Delivery: Face-to-face     HPI  [name], a 62 y.o. year old female, is here today because of her No primary diagnosis found.. Stephanie [name] primary complain today is No chief complaint on file.  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); Musculoskeletal pain; Neurogenic pain; Spasm of paraspinal muscle; Chronic hip pain (Bilateral) (R>L); Osteoarthritis of knee (Bilateral); Osteoarthritis of hip (Bilateral); Osteoarthritis of sacroiliac joint (Bilateral); Chronic sacroiliac joint pain (Bilateral); Chronic pain syndrome; Failed back surgical syndrome; Eczema of hands (Bilateral) (R>L); Abnormal MRI, knee (04/04/2018); 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 Pain Assessment: Severity of   is reported as a  /10. Location:    / . Onset:  . Quality:  . Timing:  . Modifying factor(s):  Marland Kitchen Vitals:  vitals were not taken for this visit.  BMI: Estimated body mass index is 25.06 kg/m as calculated from the following:   Height as of 05/31/23: 5\' 7"  (1.702 m).   Weight as of 05/31/23: 160 lb (72.6 kg). Last encounter: 05/31/2023. Last procedure: Visit date not found.  Reason for encounter: medication management. ***  Pharmacotherapy Assessment  Analgesic: Hydrocodone/acetaminophen 10/325 mg, 0.5 tab PO BID (10 mg/day of hydrocodone) MME/day: 10 mg/day.   Monitoring: [place] PMP: PDMP reviewed during this encounter.       Pharmacotherapy: No side-effects or adverse reactions reported. Compliance: No problems identified. Effectiveness: Clinically acceptable.  No notes on file  No results found for: "CBDTHCR" No results found for: "D8THCCBX" No results found for: "D9THCCBX"  UDS:  Summary  Date Value Ref Range Status  03/03/2023 Note  Final    Comment:    ==================================================================== [employer] (MW) ==================================================================== Test                             Result       Flag       Units  Drug Present and Declared for Prescription Verification   Hydrocodone                    1659         EXPECTED   ng/mg  creat   Hydromorphone                  221          EXPECTED   ng/mg creat   Dihydrocodeine                 207          EXPECTED   ng/mg creat   Norhydrocodone                 2319         EXPECTED   ng/mg creat    Sources  of hydrocodone include scheduled prescription medications.    Hydromorphone, dihydrocodeine and norhydrocodone are expected    metabolites of hydrocodone. Hydromorphone and dihydrocodeine are    also available as scheduled prescription medications.  ==================================================================== Test                      Result    Flag   Units      Ref Range   Creatinine              70               mg/dL      >=48 ==================================================================== Declared Medications:  The flagging and interpretation on this report are based on the  following declared medications.  Unexpected results may arise from  inaccuracies in the declared medications.   **Note: The testing scope of this panel includes these medications:   Hydrocodone (Norco)   **Note: The testing scope of this panel does not include the  following reported medications:   Acetaminophen (Norco)  Aspirin  Cholecalciferol  Cyclobenzaprine (Flexeril)  Dulaglutide (Trulicity)  Escitalopram (Lexapro)  Gabapentin (Neurontin)  Ibuprofen (Advil)  Lurasidone (Latuda)  Melatonin  Meloxicam (Mobic)  Naloxone (Narcan)  Prednisone (Deltasone)  Triamcinolone (Kenalog)  Vitamin B12 ==================================================================== For clinical consultation, please call [phone]. ====================================================================       ROS  Constitutional: Denies any fever or chills Gastrointestinal: No reported hemesis, hematochezia, vomiting, or acute GI distress Musculoskeletal: Denies any acute onset joint swelling, redness, loss of ROM, or weakness Neurological: No reported episodes of acute onset apraxia, aphasia, dysarthria, agnosia, amnesia, paralysis, loss of coordination, or loss of consciousness  Medication Review  Cholecalciferol, Dulaglutide, HYDROcodone-acetaminophen, Melatonin, aspirin EC, cyanocobalamin,  cyclobenzaprine, escitalopram, gabapentin, ibuprofen, lurasidone, meloxicam, naloxone, and triamcinolone ointment  History Review  Allergy: Stephanie [name] is allergic to phenergan [promethazine hcl]. Drug: Stephanie [name]  reports no history of drug use. Alcohol:  reports no history of alcohol use. Tobacco:  reports that she has never smoked. She has never used smokeless tobacco. Social: Ms. Portuondo  reports that she has never smoked. She has never used smokeless tobacco. She reports that she does not drink alcohol and does not use drugs. Medical:  has a past medical history of Allergy, Anemia, iron deficiency (04/09/2014), Arthritis, Atypical chest pain (04/09/2014), CD (contact dermatitis) (03/01/2014), Chest pain (04/09/2014), Chronic pain syndrome, Degenerative disc disease, lumbar, Depression, Diabetes mellitus without complication (HCC), Lumbar radicular pain (08/22/2015), Lumbar spinal stenosis, Neuromuscular disorder (HCC), Osteoarthritis, Vitamin B12 deficiency, and Vitamin D deficiency. Surgical: Ms. Stellato  has a past surgical history that includes Lumbar spine surgery (2010); Appendectomy (2015); Cholecystectomy (2014); Cesarean section (1990); Bunionectomy (Bilateral, 2014); Cesarean section (1991); Knee arthroscopy (Right, 2014); Knee Arthroplasty (Right, 12/07/2022); and Knee arthroscopy (Right, 03/10/2023). Family: family history includes Asthma in her father; Stroke in her mother.  Laboratory Chemistry  Profile   Renal Lab Results  Component Value Date   BUN 14 11/24/2022   CREATININE 0.66 11/24/2022   GFRAA >60 09/01/2019   GFRNONAA >60 11/24/2022    Hepatic Lab Results  Component Value Date   AST 16 11/24/2022   ALT 11 11/24/2022   ALBUMIN 4.1 11/24/2022   ALKPHOS 48 11/24/2022    Electrolytes Lab Results  Component Value Date   NA 138 11/24/2022   K 3.5 11/24/2022   CL 109 11/24/2022   CALCIUM 8.7 (L) 11/24/2022   MG 1.9 09/01/2019    Bone Lab Results  Component Value  Date   VD25OH 33.18 09/01/2019   25OHVITD1 34 05/07/2016   25OHVITD2 <1.0 05/07/2016   25OHVITD3 34 05/07/2016    Inflammation (CRP: Acute Phase) (ESR: Chronic Phase) Lab Results  Component Value Date   CRP 0.9 03/09/2023   ESRSEDRATE 5 03/09/2023         Note: Above Lab results reviewed.  Recent Imaging Review  DG Knee Right Port CLINICAL DATA:  232140 Total knee replacement status 232140  EXAM: PORTABLE RIGHT KNEE - 1-2 VIEW  COMPARISON:  12/21/2017  FINDINGS: Interval postsurgical changes from right total knee arthroplasty. Arthroplasty components are in their expected alignment. No periprosthetic fracture or evidence of other complication. Expected postoperative changes within the overlying soft tissues.  IMPRESSION: Status post right total knee arthroplasty without evidence of complication.  Electronically Signed   By: Duanne Guess D.O.   On: 12/07/2022 14:59 Note: Reviewed        Physical Exam  General appearance: Well nourished, well developed, and well hydrated. In no apparent acute distress Mental status: Alert, oriented x 3 (person, place, & time)       Respiratory: No evidence of acute respiratory distress Eyes: PERLA Vitals: LMP  (LMP Unknown)  BMI: Estimated body mass index is 25.06 kg/m as calculated from the following:   Height as of 05/31/23: 5\' 7"  (1.702 m).   Weight as of 05/31/23: 160 lb (72.6 kg). Ideal: Patient weight not recorded  Assessment   Diagnosis Status  1. Chronic low back pain (1ry area of Pain) (Bilateral) (L>R)   2. Chronic lower extremity pain (2ry area of Pain) (Bilateral) (L>R)   3. Chronic knee pain (3ry area of Pain) (Bilateral) (R>L)   4. Cervicalgia   5. Failed back surgical syndrome   6. Chronic upper extremity pain (Bilateral)   7. Chronic hip pain (Bilateral) (R>L)   8. Chronic pain syndrome   9. Pharmacologic therapy   10. Chronic use of opiate for therapeutic purpose   11. Encounter for medication  management   12. Encounter for chronic pain management    Controlled Controlled Controlled   Updated Problems: No problems updated.  Plan of Care  Problem-specific:  No problem-specific Assessment & Plan notes found for this encounter.  Ms. Seona Kerns has a current medication list which includes the following long-term medication(s): cyclobenzaprine, escitalopram, gabapentin, hydrocodone-acetaminophen, meloxicam, and triamcinolone ointment.  Pharmacotherapy (Medications Ordered): No orders of the defined types were placed in this encounter.  Orders:  No orders of the defined types were placed in this encounter.  Follow-up plan:   No follow-ups on file.      Interventional Therapies  Risk  Complexity Considerations:   Estimated body mass index is 27.25 kg/m as calculated from the following:   Height as of this encounter: 5\' 7"  (1.702 m).   Weight as of this encounter: 174 lb (78.9 kg). WNL  Planned  Pending:   Diagnostic right caudal ESI #2    Under consideration:   Diagnostic bilateral lumbar facet MBB Diagnostic Caudal ESI Diagnostic Bilateral L4-5 & L5-S1Lumbar TFESI Diagnostic bilateral sacroiliac joint Blk Diagnostic bilateral hip joint injection Diagnostic bilateral genicular NB Possible Racz procedure    Completed:   Palliative Caudal ESI x1 (02/01/2017) (100/100/80/80)   Completed by other providers:   Surgery: Right total knee replacement by Dr. Francesco Sor (12/07/2022)  Therapeutic right knee IA steroid inj. x6 (05/14/2020, 07/22/2020, 08/12/2020, 09/15/2021, 12/29/2021, 04/07/2022) by Lurline Hare, PA-C  Therapeutic left knee IA steroid inj. x4 (04/29/2020, 07/22/2020, 08/12/2020, 09/29/2021) by Lurline Hare, PA-C    Therapeutic  Palliative (PRN) options:   Palliative Caudal ESI #2    Pharmacotherapy  Nonopioids transferred 10/07/2020: Mobic, Neurontin, Flexeril, melatonin, and triamcinolone ointment.         Recent Visits Date Type  Provider Dept  05/31/23 Office Visit Delano Metz, MD [facility]  Showing recent visits within past 90 days and meeting all other requirements Future Appointments Date Type Provider Dept  08/25/23 Appointment Delano Metz, MD [facility]  Showing future appointments within next 90 days and meeting all other requirements  I discussed the assessment and treatment plan with the patient. The patient was provided an opportunity to ask questions and all were answered. The patient agreed with the plan and demonstrated an understanding of the instructions.  Patient advised to call back or seek an in-person evaluation if the symptoms or condition worsens.  Duration of encounter: *** minutes.  Total time on encounter, as per AMA guidelines included both the face-to-face and non-face-to-face time personally spent by the physician and/or other qualified health care professional(s) on the day of the encounter (includes time in activities that require the physician or other qualified health care professional and does not include time in activities normally performed by clinical staff). Physician's time may include the following activities when performed: Preparing to see the patient (e.g., pre-charting review of records, searching for previously ordered imaging, lab work, and nerve conduction tests) Review of prior analgesic pharmacotherapies. Reviewing PMP Interpreting ordered tests (e.g., lab work, imaging, nerve conduction tests) Performing post-procedure evaluations, including interpretation of diagnostic procedures Obtaining and/or reviewing separately obtained history Performing a medically appropriate examination and/or evaluation Counseling and educating the patient/family/caregiver Ordering medications, tests, or procedures Referring and communicating with other health care professionals (when not separately reported) Documenting clinical information in the  electronic or other health record Independently interpreting results (not separately reported) and communicating results to the patient/ family/caregiver Care coordination (not separately reported)  Note by: Stephanie Done, MD Date: 08/25/2023; Time: 9:34 AM

## 2023-08-24 NOTE — Patient Instructions (Signed)
____________________________________________________________________________________________  Opioid Pain Medication Update  To: All patients taking opioid pain medications. (I.e.: hydrocodone, hydromorphone, oxycodone, oxymorphone, morphine, codeine, methadone, tapentadol, tramadol, buprenorphine, fentanyl, etc.)  Re: Updated review of side effects and adverse reactions of opioid analgesics, as well as new information about long term effects of this class of medications.  Direct risks of long-term opioid therapy are not limited to opioid addiction and overdose. Potential medical risks include serious fractures, breathing problems during sleep, hyperalgesia, immunosuppression, chronic constipation, bowel obstruction, myocardial infarction, and tooth decay secondary to xerostomia.  Unpredictable adverse effects that can occur even if you take your medication correctly: Cognitive impairment, respiratory depression, and death. Most people think that if they take their medication "correctly", and "as instructed", that they will be safe. Nothing could be farther from the truth. In reality, a significant amount of recorded deaths associated with the use of opioids has occurred in individuals that had taken the medication for a long time, and were taking their medication correctly. The following are examples of how this can happen: Patient taking his/her medication for a long time, as instructed, without any side effects, is given a certain antibiotic or another unrelated medication, which in turn triggers a "Drug-to-drug interaction" leading to disorientation, cognitive impairment, impaired reflexes, respiratory depression or an untoward event leading to serious bodily harm or injury, including death.  Patient taking his/her medication for a long time, as instructed, without any side effects, develops an acute impairment of liver and/or kidney function. This will lead to a rapid inability of the body to  breakdown and eliminate their pain medication, which will result in effects similar to an "overdose", but with the same medicine and dose that they had always taken. This again may lead to disorientation, cognitive impairment, impaired reflexes, respiratory depression or an untoward event leading to serious bodily harm or injury, including death.  A similar problem will occur with patients as they grow older and their liver and kidney function begins to decrease as part of the aging process.  Background information: Historically, the original case for using long-term opioid therapy to treat chronic noncancer pain was based on safety assumptions that subsequent experience has called into question. In 1996, the American Pain Society and the American Academy of Pain Medicine issued a consensus statement supporting long-term opioid therapy. This statement acknowledged the dangers of opioid prescribing but concluded that the risk for addiction was low; respiratory depression induced by opioids was short-lived, occurred mainly in opioid-naive patients, and was antagonized by pain; tolerance was not a common problem; and efforts to control diversion should not constrain opioid prescribing. This has now proven to be wrong. Experience regarding the risks for opioid addiction, misuse, and overdose in community practice has failed to support these assumptions.  According to the Centers for Disease Control and Prevention, fatal overdoses involving opioid analgesics have increased sharply over the past decade. Currently, more than 96,700 people die from drug overdoses every year. Opioids are a factor in 7 out of every 10 overdose deaths. Deaths from drug overdose have surpassed motor vehicle accidents as the leading cause of death for individuals between the ages of 80 and 61.  Clinical data suggest that neuroendocrine dysfunction may be very common in both men and women, potentially causing hypogonadism, erectile  dysfunction, infertility, decreased libido, osteoporosis, and depression. Recent studies linked higher opioid dose to increased opioid-related mortality. Controlled observational studies reported that long-term opioid therapy may be associated with increased risk for cardiovascular events. Subsequent meta-analysis concluded  that the safety of long-term opioid therapy in elderly patients has not been proven.   Side Effects and adverse reactions: Common side effects: Drowsiness (sedation). Dizziness. Nausea and vomiting. Constipation. Physical dependence -- Dependence often manifests with withdrawal symptoms when opioids are discontinued or decreased. Tolerance -- As you take repeated doses of opioids, you require increased medication to experience the same effect of pain relief. Respiratory depression -- This can occur in healthy people, especially with higher doses. However, people with COPD, asthma or other lung conditions may be even more susceptible to fatal respiratory impairment.  Uncommon side effects: An increased sensitivity to feeling pain and extreme response to pain (hyperalgesia). Chronic use of opioids can lead to this. Delayed gastric emptying (the process by which the contents of your stomach are moved into your small intestine). Muscle rigidity. Immune system and hormonal dysfunction. Quick, involuntary muscle jerks (myoclonus). Arrhythmia. Itchy skin (pruritus). Dry mouth (xerostomia).  Long-term side effects: Chronic constipation. Sleep-disordered breathing (SDB). Increased risk of bone fractures. Hypothalamic-pituitary-adrenal dysregulation. Increased risk of overdose.  RISKS: Respiratory depression and death: Opioids increase the risk of respiratory depression and death.  Drug-to-drug interactions: Opioids are relatively contraindicated in combination with benzodiazepines, sleep inducers, and other central nervous system depressants. Other classes of medications  (i.e.: certain antibiotics and even over-the-counter medications) may also trigger or induce respiratory depression in some patients.  Medical conditions: Patients with pre-existing respiratory problems are at higher risk of respiratory failure and/or depression when in combination with opioid analgesics. Opioids are relatively contraindicated in some medical conditions such as central sleep apnea.   Fractures and Falls:  Opioids increase the risk and incidence of falls. This is of particular importance in elderly patients.  Endocrine System:  Long-term administration is associated with endocrine abnormalities (endocrinopathies). (Also known as Opioid-induced Endocrinopathy) Influences on both the hypothalamic-pituitary-adrenal axis?and the hypothalamic-pituitary-gonadal axis have been demonstrated with consequent hypogonadism and adrenal insufficiency in both sexes. Hypogonadism and decreased levels of dehydroepiandrosterone sulfate have been reported in men and women. Endocrine effects include: Amenorrhoea in women (abnormal absence of menstruation) Reduced libido in both sexes Decreased sexual function Erectile dysfunction in men Hypogonadisms (decreased testicular function with shrinkage of testicles) Infertility Depression and fatigue Loss of muscle mass Anxiety Depression Immune suppression Hyperalgesia Weight gain Anemia Osteoporosis Patients (particularly women of childbearing age) should avoid opioids. There is insufficient evidence to recommend routine monitoring of asymptomatic patients taking opioids in the long-term for hormonal deficiencies.  Immune System: Human studies have demonstrated that opioids have an immunomodulating effect. These effects are mediated via opioid receptors both on immune effector cells and in the central nervous system. Opioids have been demonstrated to have adverse effects on antimicrobial response and anti-tumour surveillance. Buprenorphine has  been demonstrated to have no impact on immune function.  Opioid Induced Hyperalgesia: Human studies have demonstrated that prolonged use of opioids can lead to a state of abnormal pain sensitivity, sometimes called opioid induced hyperalgesia (OIH). Opioid induced hyperalgesia is not usually seen in the absence of tolerance to opioid analgesia. Clinically, hyperalgesia may be diagnosed if the patient on long-term opioid therapy presents with increased pain. This might be qualitatively and anatomically distinct from pain related to disease progression or to breakthrough pain resulting from development of opioid tolerance. Pain associated with hyperalgesia tends to be more diffuse than the pre-existing pain and less defined in quality. Management of opioid induced hyperalgesia requires opioid dose reduction.  Cancer: Chronic opioid therapy has been associated with an increased risk of cancer  among noncancer patients with chronic pain. This association was more evident in chronic strong opioid users. Chronic opioid consumption causes significant pathological changes in the small intestine and colon. Epidemiological studies have found that there is a link between opium dependence and initiation of gastrointestinal cancers. Cancer is the second leading cause of death after cardiovascular disease. Chronic use of opioids can cause multiple conditions such as GERD, immunosuppression and renal damage as well as carcinogenic effects, which are associated with the incidence of cancers.   Mortality: Long-term opioid use has been associated with increased mortality among patients with chronic non-cancer pain (CNCP).  Prescription of long-acting opioids for chronic noncancer pain was associated with a significantly increased risk of all-cause mortality, including deaths from causes other than overdose.  Reference: Von Korff M, Kolodny A, Deyo RA, Chou R. Long-term opioid therapy reconsidered. Ann Intern Med. 2011  Sep 6;155(5):325-8. doi: 10.7326/[id number]. PMID: 64403474; PMCID: [id number]. Randon Goldsmith, Hayward RA, Dunn KM, [place] KP. Risk of adverse events in patients prescribed long-term opioids: A cohort study in the [place] [facility] Research Datalink. Eur J Pain. 2019 May;23(5):908-922. doi: 10.1002/ejp.1357. Epub 2019 Jan 31. PMID: 43329518. Colameco S, Coren JS, Ciervo CA. Continuous opioid treatment for chronic noncancer pain: a time for moderation in prescribing. Postgrad Med. 2009 Jul;121(4):61-6. doi: 10.3810/pgm.2009.07.2032. PMID: 84166063. William Hamburger RN, [place] SD, Blazina I, Cristopher Peru, Bougatsos C, Deyo RA. The effectiveness and risks of long-term opioid therapy for chronic pain: a systematic review for a [employer] of [employer] Pathways to [employer]. Ann Intern Med. 2015 Feb 17;162(4):276-86. doi: 10.7326/M14-2559. PMID: 01601093. Caryl Bis [facility], Makuc DM. NCHS Data Brief No. 22. [place]: Centers for Disease Control and Prevention; 2009. Sep, Increase in Fatal Poisonings Involving Opioid Analgesics in the [place], 1999-2006. Song IA, Choi HR, Oh TK. Long-term opioid use and mortality in patients with chronic non-cancer pain: Ten-year follow-up study in [place] from 2010 through 2019. EClinicalMedicine. 2022 Jul 18;51:101558. doi: 10.1016/j.eclinm.2022.235573. PMID: 22025427; PMCID: [id number]. Huser, W., Schubert, T., Vogelmann, T. et al. All-cause mortality in patients with long-term opioid therapy compared with non-opioid analgesics for chronic non-cancer pain: a database study. BMC Med 18, 162 (2020). http://lester.info/ Rashidian H, Karie Kirks, Malekzadeh R, Haghdoost AA. An Ecological Study of the Association between Opiate Use and Incidence of Cancers. Addict Health. 2016 Fall;8(4):252-260. PMID: 51761607; PMCID: [id number].  Our Goal: Our goal is to control your  pain with means other than the use of opioid pain medications.  Our Recommendation: Talk to your physician about coming off of these medications. We can assist you with the tapering down and stopping these medicines. Based on the new information, even if you cannot completely stop the medication, a decrease in the dose may be associated with a lesser risk. Ask for other means of controlling the pain. Decrease or eliminate those factors that significantly contribute to your pain such as smoking, obesity, and a diet heavily tilted towards "inflammatory" nutrients.  Last Updated: 05/10/2023   ____________________________________________________________________________________________     ____________________________________________________________________________________________  National Pain Medication Shortage  The [place] is experiencing worsening drug shortages. These have had a negative widespread effect on patient care and treatment. Not expected to improve any time soon. Predicted to last past 2029.   Drug shortage list (generic names) Oxycodone IR Oxycodone/APAP Oxymorphone IR Hydromorphone Hydrocodone/APAP Morphine  Where is the problem?  Manufacturing and supply level.  Will this shortage affect you?  Only if you  take any of the above pain medications.  How? You may be unable to fill your prescription.  Your pharmacist may offer a "partial fill" of your prescription. (Warning: Do not accept partial fills.) Prescriptions partially filled cannot be transferred to another pharmacy. Read our Medication Rules and Regulation. Depending on how much medicine you are dependent on, you may experience withdrawals when unable to get the medication.  Recommendations: Consider ending your dependence on opioid pain medications. Ask your pain specialist to assist you with the process. Consider switching to a medication currently not in shortage, such as Buprenorphine. Talk to your pain  specialist about this option. Consider decreasing your pain medication requirements by managing tolerance thru "Drug Holidays". This may help minimize withdrawals, should you run out of medicine. Control your pain thru the use of non-pharmacological interventional therapies.   Your prescriber: Prescribers cannot be blamed for shortages. Medication manufacturing and supply issues cannot be fixed by the prescriber.   NOTE: The prescriber is not responsible for supplying the medication, or solving supply issues. Work with your pharmacist to solve it. The patient is responsible for the decision to take or continue taking the medication and for identifying and securing a legal supply source. By law, supplying the medication is the job and responsibility of the pharmacy. The prescriber is responsible for the evaluation, monitoring, and prescribing of these medications.   Prescribers will NOT: Re-issue prescriptions that have been partially filled. Re-issue prescriptions already sent to a pharmacy.  Re-send prescriptions to a different pharmacy because yours did not have your medication. Ask pharmacist to order more medicine or transfer the prescription to another pharmacy. (Read below.)  New 2023 regulation: "July 03, 2022 Revised Regulation Allows DEA-Registered Pharmacies to Transfer Electronic Prescriptions at a Patient's Request DEA Headquarters Division - Public Information Office Patients now have the ability to request their electronic prescription be transferred to another pharmacy without having to go back to their practitioner to initiate the request. This revised regulation went into effect on Monday, June 29, 2022.     At a patient's request, a DEA-registered retail pharmacy can now transfer an electronic prescription for a controlled substance (schedules II-V) to another DEA-registered retail pharmacy. Prior to this change, patients would have to go through their practitioner to  cancel their prescription and have it re-issued to a different pharmacy. The process was taxing and time consuming for both patients and practitioners.    The Drug Enforcement Administration [facility]) published its intent to revise the process for transferring electronic prescriptions on September 20, 2020.  The final rule was published in the federal register on May 28, 2022 and went into effect 30 days later.  Under the final rule, a prescription can only be transferred once between pharmacies, and only if allowed under existing state or other applicable law. The prescription must remain in its electronic form; may not be altered in any way; and the transfer must be communicated directly between two licensed pharmacists. It's important to note, any authorized refills transfer with the original prescription, which means the entire prescription will be filled at the same pharmacy".  Reference: [email or web address].is [facility] website announcement)  [email or web address] [employer] of Justice)   [employer] / Vol. 88, No. 143 / Thursday, May 28, 2022 / Rules and Regulations DEPARTMENT OF JUSTICE  Drug Enforcement Administration  21 CFR Part 1306  [Docket No. [id number]]  RIN [id number] Transfer of Electronic Prescriptions for Schedules II-V Controlled Substances Between Pharmacies for Initial Filling  ____________________________________________________________________________________________  ____________________________________________________________________________________________  Transfer of Pain Medication between Pharmacies  Re: 2023 DEA Clarification on existing regulation  Published on DEA Website: July 03, 2022  Title: Revised Regulation Allows DEA-Registered Pharmacies to Electrical engineer Prescriptions at a Patient's  Request DEA Headquarters Division - [employer]  "Patients now have the ability to request their electronic prescription be transferred to another pharmacy without having to go back to their practitioner to initiate the request. This revised regulation went into effect on Monday, June 29, 2022.     At a patient's request, a DEA-registered retail pharmacy can now transfer an electronic prescription for a controlled substance (schedules II-V) to another DEA-registered retail pharmacy. Prior to this change, patients would have to go through their practitioner to cancel their prescription and have it re-issued to a different pharmacy. The process was taxing and time consuming for both patients and practitioners.    The Drug Enforcement Administration [facility]) published its intent to revise the process for transferring electronic prescriptions on September 20, 2020.  The final rule was published in the federal register on May 28, 2022 and went into effect 30 days later.  Under the final rule, a prescription can only be transferred once between pharmacies, and only if allowed under existing state or other applicable law. The prescription must remain in its electronic form; may not be altered in any way; and the transfer must be communicated directly between two licensed pharmacists. It's important to note, any authorized refills transfer with the original prescription, which means the entire prescription will be filled at the same pharmacy."    REFERENCES: 1. DEA website announcement [email or web address]  2. Department of Justice website  [email or web address]  3. DEPARTMENT OF JUSTICE Drug Enforcement Administration 21 CFR Part 1306 [Docket No. [id number]] RIN [id number] "Transfer of Electronic Prescriptions for Schedules II-V Controlled Substances  Between Pharmacies for Initial Filling"  ____________________________________________________________________________________________     _______________________________________________________________________  Medication Rules  Purpose: To inform patients, and their family members, of our medication rules and regulations.  Applies to: All patients receiving prescriptions from our practice (written or electronic).  Pharmacy of record: This is the pharmacy where your electronic prescriptions will be sent. Make sure we have the correct one.  Electronic prescriptions: In compliance with the [facility] Strengthen Opioid Misuse Prevention (STOP) Act of 2017 (Session Conni Elliot [phone]), effective November 02, 2018, all controlled substances must be electronically prescribed. Written prescriptions, faxing, or calling prescriptions to a pharmacy will no longer be done.  Prescription refills: These will be provided only during in-person appointments. No medications will be renewed without a "face-to-face" evaluation with your provider. Applies to all prescriptions.  NOTE: The following applies primarily to controlled substances (Opioid* Pain Medications).   Type of encounter (visit): For patients receiving controlled substances, face-to-face visits are required. (Not an option and not up to the patient.)  Patient's responsibilities: Pain Pills: Bring all pain pills to every appointment (except for procedure appointments). Pill Bottles: Bring pills in original pharmacy bottle. Bring bottle, even if empty. Always bring the bottle of the most recent fill.  Medication refills: You are responsible for knowing and keeping track of what medications you are taking and when is it that you will need a refill. The day before your appointment: write a list of all prescriptions that need to be refilled. The day of the appointment: give the list to the admitting nurse. Prescriptions will be written only  during appointments. No prescriptions will be written on procedure days. If you forget a  medication: it will not be "Called in", "Faxed", or "electronically sent". You will need to get another appointment to get these prescribed. No early refills. Do not call asking to have your prescription filled early. Partial  or short prescriptions: Occasionally your pharmacy may not have enough pills to fill your prescription.  NEVER ACCEPT a partial fill or a prescription that is short of the total amount of pills that you were prescribed.  With controlled substances the law allows 72 hours for the pharmacy to complete the prescription.  If the prescription is not completed within 72 hours, the pharmacist will require a new prescription to be written. This means that you will be short on your medicine and we WILL NOT send another prescription to complete your original prescription.  Instead, request the pharmacy to send a carrier to a nearby branch to get enough medication to provide you with your full prescription. Prescription Accuracy: You are responsible for carefully inspecting your prescriptions before leaving our office. Have the discharge nurse carefully go over each prescription with you, before taking them home. Make sure that your name is accurately spelled, that your address is correct. Check the name and dose of your medication to make sure it is accurate. Check the number of pills, and the written instructions to make sure they are clear and accurate. Make sure that you are given enough medication to last until your next medication refill appointment. Taking Medication: Take medication as prescribed. When it comes to controlled substances, taking less pills or less frequently than prescribed is permitted and encouraged. Never take more pills than instructed. Never take the medication more frequently than prescribed.  Inform other Doctors: Always inform, all of your healthcare providers, of all the  medications you take. Pain Medication from other Providers: You are not allowed to accept any additional pain medication from any other Doctor or Healthcare provider. There are two exceptions to this rule. (see below) In the event that you require additional pain medication, you are responsible for notifying us, as stated below. Cough Medicine: Often these contain an opioid, such as codeine or hydrocodone. Never accept or take cough medicine containing these opioids if you are already taking an opioid* medication. The combination may cause respiratory failure and death. Medication Agreement: You are responsible for carefully reading and following our Medication Agreement. This must be signed before receiving any prescriptions from our practice. Safely store a copy of your signed Agreement. Violations to the Agreement will result in no further prescriptions. (Additional copies of our Medication Agreement are available upon request.) Laws, Rules, & Regulations: All patients are expected to follow all [address] and [employer], [employer], Rules, [place] [employer]. Ignorance of the Laws does not constitute a valid excuse.  Illegal drugs and Controlled Substances: The use of illegal substances (including, but not limited to marijuana and its derivatives) and/or the illegal use of any controlled substances is strictly prohibited. Violation of this rule may result in the immediate and permanent discontinuation of any and all prescriptions being written by our practice. The use of any illegal substances is prohibited. Adopted CDC guidelines & recommendations: Target dosing levels will be at or below 60 MME/day. Use of benzodiazepines** is not recommended.  Exceptions: There are only two exceptions to the rule of not receiving pain medications from other Healthcare Providers. Exception #1 (Emergencies): In the event of an emergency (i.e.: accident requiring emergency care), you are allowed to receive additional pain  medication. However, you are responsible for: As soon as  you are able, call our office [phone], at any time of the day or night, and leave a message stating your name, the date and nature of the emergency, and the name and dose of the medication prescribed. In the event that your call is answered by a member of our staff, make sure to document and save the date, time, and the name of the person that took your information.  Exception #2 (Planned Surgery): In the event that you are scheduled by another doctor or dentist to have any type of surgery or procedure, you are allowed (for a period no longer than 30 days), to receive additional pain medication, for the acute post-op pain. However, in this case, you are responsible for picking up a copy of our "Post-op Pain Management for Surgeons" handout, and giving it to your surgeon or dentist. This document is available at our office, and does not require an appointment to obtain it. Simply go to our office during business hours (Monday-Thursday from 8:00 AM to 4:00 PM) (Friday 8:00 AM to 12:00 Noon) or if you have a scheduled appointment with [place], prior to your surgery, and ask for it by name. In addition, you are responsible for: calling our office [phone], at any time of the day or night, and leaving a message stating your name, name of your surgeon, type of surgery, and date of procedure or surgery. Failure to comply with your responsibilities may result in termination of therapy involving the controlled substances. Medication Agreement Violation. Following the above rules, including your responsibilities will help you in avoiding a Medication Agreement Violation ("Breaking your Pain Medication Contract").  Consequences:  Not following the above rules may result in permanent discontinuation of medication prescription therapy.  *Opioid medications include: morphine, codeine, oxycodone, oxymorphone, hydrocodone, hydromorphone, meperidine, tramadol,  tapentadol, buprenorphine, fentanyl, methadone. **Benzodiazepine medications include: diazepam (Valium), alprazolam (Xanax), clonazepam (Klonopine), lorazepam (Ativan), clorazepate (Tranxene), chlordiazepoxide (Librium), estazolam (Prosom), oxazepam (Serax), temazepam (Restoril), triazolam (Halcion) (Last updated: 08/25/2022) ______________________________________________________________________    ______________________________________________________________________  Medication Recommendations and Reminders  Applies to: All patients receiving prescriptions (written and/or electronic).  Medication Rules & Regulations: You are responsible for reading, knowing, and following our "Medication Rules" document. These exist for your safety and that of others. They are not flexible and neither are we. Dismissing or ignoring them is an act of "non-compliance" that may result in complete and irreversible termination of such medication therapy. For safety reasons, "non-compliance" will not be tolerated. As with the [place] fundamental legal principle of "ignorance of the law is no defense", we will accept no excuses for not having read and knowing the content of documents provided to you by our practice.  Pharmacy of record:  Definition: This is the pharmacy where your electronic prescriptions will be sent.  We do not endorse any particular pharmacy. It is up to you and your insurance to decide what pharmacy to use.  We do not restrict you in your choice of pharmacy. However, once we write for your prescriptions, we will NOT be re-sending more prescriptions to fix restricted supply problems created by your pharmacy, or your insurance.  The pharmacy listed in the electronic medical record should be the one where you want electronic prescriptions to be sent. If you choose to change pharmacy, simply notify our nursing staff. Changes will be made only during your regular appointments and not over the  phone.  Recommendations: Keep all of your pain medications in a safe place, under lock and key, even  if you live alone. We will NOT replace lost, stolen, or damaged medication. We do not accept "Police Reports" as proof of medications having been stolen. After you fill your prescription, take 1 week's worth of pills and put them away in a safe place. You should keep a separate, properly labeled bottle for this purpose. The remainder should be kept in the original bottle. Use this as your primary supply, until it runs out. Once it's gone, then you know that you have 1 week's worth of medicine, and it is time to come in for a prescription refill. If you do this correctly, it is unlikely that you will ever run out of medicine. To make sure that the above recommendation works, it is very important that you make sure your medication refill appointments are scheduled at least 1 week before you run out of medicine. To do this in an effective manner, make sure that you do not leave the office without scheduling your next medication management appointment. Always ask the nursing staff to show you in your prescription , when your medication will be running out. Then arrange for the receptionist to get you a return appointment, at least 7 days before you run out of medicine. Do not wait until you have 1 or 2 pills left, to come in. This is very poor planning and does not take into consideration that we may need to cancel appointments due to bad weather, sickness, or emergencies affecting our staff. DO NOT ACCEPT A "Partial Fill": If for any reason your pharmacy does not have enough pills/tablets to completely fill or refill your prescription, do not allow for a "partial fill". The law allows the pharmacy to complete that prescription within 72 hours, without requiring a new prescription. If they do not fill the rest of your prescription within those 72 hours, you will need a separate prescription to fill the remaining  amount, which we will NOT provide. If the reason for the partial fill is your insurance, you will need to talk to the pharmacist about payment alternatives for the remaining tablets, but again, DO NOT ACCEPT A PARTIAL FILL, unless you can trust your pharmacist to obtain the remainder of the pills within 72 hours.  Prescription refills and/or changes in medication(s):  Prescription refills, and/or changes in dose or medication, will be conducted only during scheduled medication management appointments. (Applies to both, written and electronic prescriptions.) No refills on procedure days. No medication will be changed or started on procedure days. No changes, adjustments, and/or refills will be conducted on a procedure day. Doing so will interfere with the diagnostic portion of the procedure. No phone refills. No medications will be "called into the pharmacy". No Fax refills. No weekend refills. No Holliday refills. No after hours refills.  Remember:  Business hours are:  Monday to Thursday 8:00 AM to 4:00 PM Provider's Schedule: Delano Metz, MD - Appointments are:  Medication management: Monday and Wednesday 8:00 AM to 4:00 PM Procedure day: Tuesday and Thursday 7:30 AM to 4:00 PM Edward Jolly, MD - Appointments are:  Medication management: Tuesday and Thursday 8:00 AM to 4:00 PM Procedure day: Monday and Wednesday 7:30 AM to 4:00 PM (Last update: 08/25/2022) ______________________________________________________________________   ____________________________________________________________________________________________  Naloxone Nasal Spray  Why am I receiving this medication? [place] STOP ACT requires that all patients taking high dose opioids or at risk of opioids respiratory depression, be prescribed an opioid reversal agent, such as Naloxone (AKA: Narcan).  What is this medication? NALOXONE (  nal OX one) treats opioid overdose, which causes slow or shallow breathing,  severe drowsiness, or trouble staying awake. Call emergency services after using this medication. You may need additional treatment. Naloxone works by reversing the effects of opioids. It belongs to a group of medications called opioid blockers.  COMMON BRAND NAME(S): Kloxxado, Narcan  What should I tell my care team before I take this medication? They need to know if you have any of these conditions: Heart disease Substance use disorder An unusual or allergic reaction to naloxone, other medications, foods, dyes, or preservatives Pregnant or trying to get pregnant Breast-feeding  When to use this medication? This medication is to be used for the treatment of respiratory depression (less than 8 breaths per minute) secondary to opioid overdose.   How to use this medication? This medication is for use in the nose. Lay the person on their back. Support their neck with your hand and allow the head to tilt back before giving the medication. The nasal spray should be given into 1 nostril. After giving the medication, move the person onto their side. Do not remove or test the nasal spray until ready to use. Get emergency medical help right away after giving the first dose of this medication, even if the person wakes up. You should be familiar with how to recognize the signs and symptoms of a narcotic overdose. If more doses are needed, give the additional dose in the other nostril. Talk to your care team about the use of this medication in children. While this medication may be prescribed for children as young as newborns for selected conditions, precautions do apply.  Naloxone Overdosage: If you think you have taken too much of this medicine contact a poison control center or emergency room at once.  NOTE: This medicine is only for you. Do not share this medicine with others.  What if I miss a dose? This does not apply.  What may interact with this medication? This is only used during an  emergency. No interactions are expected during emergency use. This list may not describe all possible interactions. Give your health care provider a list of all the medicines, herbs, non-prescription drugs, or dietary supplements you use. Also tell them if you smoke, drink alcohol, or use illegal drugs. Some items may interact with your medicine.  What should I watch for while using this medication? Keep this medication ready for use in the case of an opioid overdose. Make sure that you have the phone number of your care team and local hospital ready. You may need to have additional doses of this medication. Each nasal spray contains a single dose. Some emergencies may require additional doses. After use, bring the treated person to the nearest hospital or call 911. Make sure the treating care team knows that the person has received a dose of this medication. You will receive additional instructions on what to do during and after use of this medication before an emergency occurs.  What side effects may I notice from receiving this medication? Side effects that you should report to your care team as soon as possible: Allergic reactions--skin rash, itching, hives, swelling of the face, lips, tongue, or throat Side effects that usually do not require medical attention (report these to your care team if they continue or are bothersome): Constipation Dryness or irritation inside the nose Headache Increase in blood pressure Muscle spasms Stuffy nose Toothache This list may not describe all possible side effects. Call your doctor for  medical advice about side effects. You may report side effects to FDA at 1-800-FDA-1088.  Where should I keep my medication? Because this is an emergency medication, you should keep it with you at all times.  Keep out of the reach of children and pets. Store between 20 and 25 degrees C (68 and 77 degrees F). Do not freeze. Throw away any unused medication after the  expiration date. Keep in original box until ready to use.  NOTE: This sheet is a summary. It may not cover all possible information. If you have questions about this medicine, talk to your doctor, pharmacist, or health care provider.   2023 [employer]/Gold Standard (2021-06-27 00:00:00)  ____________________________________________________________________________________________

## 2023-08-25 ENCOUNTER — Encounter: Payer: Self-pay | Admitting: Pain Medicine

## 2023-08-25 ENCOUNTER — Ambulatory Visit: Payer: No Typology Code available for payment source | Attending: Pain Medicine | Admitting: Pain Medicine

## 2023-08-25 VITALS — BP 118/83 | HR 97 | Temp 97.2°F | Resp 16 | Ht 67.0 in | Wt 180.0 lb

## 2023-08-25 DIAGNOSIS — Z79899 Other long term (current) drug therapy: Secondary | ICD-10-CM | POA: Diagnosis present

## 2023-08-25 DIAGNOSIS — Z79891 Long term (current) use of opiate analgesic: Secondary | ICD-10-CM | POA: Insufficient documentation

## 2023-08-25 DIAGNOSIS — M25561 Pain in right knee: Secondary | ICD-10-CM | POA: Insufficient documentation

## 2023-08-25 DIAGNOSIS — G8929 Other chronic pain: Secondary | ICD-10-CM | POA: Diagnosis present

## 2023-08-25 DIAGNOSIS — M79605 Pain in left leg: Secondary | ICD-10-CM | POA: Insufficient documentation

## 2023-08-25 DIAGNOSIS — M79622 Pain in left upper arm: Secondary | ICD-10-CM | POA: Insufficient documentation

## 2023-08-25 DIAGNOSIS — M25551 Pain in right hip: Secondary | ICD-10-CM | POA: Diagnosis present

## 2023-08-25 DIAGNOSIS — M25552 Pain in left hip: Secondary | ICD-10-CM | POA: Insufficient documentation

## 2023-08-25 DIAGNOSIS — M25562 Pain in left knee: Secondary | ICD-10-CM | POA: Insufficient documentation

## 2023-08-25 DIAGNOSIS — M5442 Lumbago with sciatica, left side: Secondary | ICD-10-CM | POA: Diagnosis not present

## 2023-08-25 DIAGNOSIS — M79621 Pain in right upper arm: Secondary | ICD-10-CM | POA: Insufficient documentation

## 2023-08-25 DIAGNOSIS — G894 Chronic pain syndrome: Secondary | ICD-10-CM | POA: Diagnosis present

## 2023-08-25 DIAGNOSIS — M79604 Pain in right leg: Secondary | ICD-10-CM | POA: Insufficient documentation

## 2023-08-25 DIAGNOSIS — M5441 Lumbago with sciatica, right side: Secondary | ICD-10-CM | POA: Diagnosis not present

## 2023-08-25 DIAGNOSIS — M542 Cervicalgia: Secondary | ICD-10-CM | POA: Insufficient documentation

## 2023-08-25 DIAGNOSIS — M961 Postlaminectomy syndrome, not elsewhere classified: Secondary | ICD-10-CM | POA: Diagnosis present

## 2023-08-25 MED ORDER — HYDROCODONE-ACETAMINOPHEN 10-325 MG PO TABS: 0.5000 | ORAL_TABLET | Freq: Two times a day (BID) | ORAL | 0 refills | Status: DC | PRN

## 2023-08-25 NOTE — Progress Notes (Signed)
Nursing Pain Medication Assessment:  Safety precautions to be maintained throughout the outpatient stay will include: orient to surroundings, keep bed in low position, maintain call bell within reach at all times, provide assistance with transfer out of bed and ambulation.  Medication Inspection Compliance: Pill count conducted under aseptic conditions, in front of the patient. Neither the pills nor the bottle was removed from the patient's sight at any time. Once count was completed pills were immediately returned to the patient in their original bottle.  Medication: Hydrocodone/APAP Pill/Patch Count:  9.5 of 30 pills remain Pill/Patch Appearance: Markings consistent with prescribed medication Bottle Appearance: Standard pharmacy container. Clearly labeled. Filled Date: 10 / 03 / 2024 Last Medication intake:  Today

## 2023-08-30 ENCOUNTER — Encounter: Payer: Medicare Other | Admitting: Pain Medicine

## 2023-10-04 ENCOUNTER — Telehealth: Payer: Self-pay | Admitting: Pain Medicine

## 2023-10-04 NOTE — Telephone Encounter (Signed)
Patient fell on Monday last week. She is in a lot of pain. Starts in mid lower back out to both sides. She has not gone to the ED or anyone. Wants to know what Dr Laban Emperor would recommend, xrays, come in to office ? Please call patient

## 2023-10-05 ENCOUNTER — Telehealth: Payer: Self-pay

## 2023-10-05 NOTE — Telephone Encounter (Signed)
Patient notifed to go to urgent care or ED for acute pain.  States understanding.

## 2023-11-28 NOTE — Progress Notes (Unsigned)
PROVIDER NOTE: Information contained herein reflects review and annotations entered in association with encounter. Interpretation of such information and data should be left to medically-trained personnel. Information provided to patient can be located elsewhere in the medical record under "Patient Instructions". Document created using STT-dictation technology, any transcriptional errors that may result from process are unintentional.    Patient: Stephanie [name]  Service Category: E/M  Provider: Oswaldo Done, MD  DOB: 21-Dec-1960  DOS: 11/29/2023  Referring Provider: Luciana Axe, NP  MRN: [id number]  Specialty: Interventional Pain Management  PCP: Stephanie Axe, NP  Type: Established Patient  Setting: Ambulatory outpatient    Location: Office  Delivery: Face-to-face     HPI  [name], a 63 y.o. year old female, is here today because of her No primary diagnosis found.. [name]'s primary complain today is No chief complaint on file.  Pertinent problems: 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); Musculoskeletal pain; Neurogenic pain; Spasm of paraspinal muscle; Chronic hip pain (Bilateral) (R>L); Osteoarthritis of knee (Bilateral); Osteoarthritis of hip (Bilateral); Osteoarthritis of sacroiliac joint (Bilateral); Chronic sacroiliac joint pain (Bilateral); Chronic pain syndrome; Failed back surgical syndrome; Eczema of hands (Bilateral) (R>L); Abnormal MRI, knee (04/04/2018); 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 Pain Assessment: Severity of   is reported as a  /10. Location:    / . Onset:  . Quality:  . Timing:  . Modifying factor(s):  Marland Kitchen Vitals:  vitals were not taken for this visit.  BMI: Estimated body mass index is 28.19 kg/m as calculated from the following:   Height as of 08/25/23: 5\' 7"  (1.702 m).   Weight as of 08/25/23: 180 lb (81.6 kg). Last encounter: 08/25/2023. Last procedure: Visit date not found.  Reason for encounter:  *** . ***  Discussed the use of AI scribe software for clinical note transcription with the patient, who gave verbal consent to proceed.  History of Present Illness           Pharmacotherapy Assessment  Analgesic:  Hydrocodone/acetaminophen 10/325 mg, 0.5 tab PO BID (10 mg/day of hydrocodone) MME/day: 10 mg/day.   Monitoring: [place] PMP: PDMP reviewed during this encounter.       Pharmacotherapy: No side-effects or adverse reactions reported. Compliance: No problems identified. Effectiveness: Clinically acceptable.  No notes on file  No results found for: "CBDTHCR" No results found for: "D8THCCBX" No results found for: "D9THCCBX"  UDS:  Summary  Date Value Ref Range Status  03/03/2023 Note  Final    Comment:    ==================================================================== [employer] [facility] [employer] (MW) ==================================================================== Test                             Result       Flag       Units  Drug Present and Declared for  Prescription Verification   Hydrocodone                    1659         EXPECTED   ng/mg creat   Hydromorphone                  221          EXPECTED   ng/mg  creat   Dihydrocodeine                 207          EXPECTED   ng/mg creat   Norhydrocodone                 2319         EXPECTED   ng/mg creat    Sources of hydrocodone include scheduled prescription medications.    Hydromorphone, dihydrocodeine and norhydrocodone are expected    metabolites of hydrocodone. Hydromorphone and dihydrocodeine are    also available as scheduled prescription medications.  ==================================================================== Test                      Result    Flag   Units      Ref Range   Creatinine              70               mg/dL      >=40 ==================================================================== Declared Medications:  The flagging and interpretation on this report are based on the  following declared medications.  Unexpected results may arise from  inaccuracies in the declared medications.   **Note: The testing scope of this panel includes these medications:   Hydrocodone (Norco)   **Note: The testing scope of this panel does not include the  following reported medications:   Acetaminophen (Norco)  Aspirin  Cholecalciferol  Cyclobenzaprine (Flexeril)  Dulaglutide (Trulicity)  Escitalopram (Lexapro)  Gabapentin (Neurontin)  Ibuprofen (Advil)  Lurasidone (Latuda)  Melatonin  Meloxicam (Mobic)  Naloxone (Narcan)  Prednisone (Deltasone)  Triamcinolone (Kenalog)  Vitamin B12 ==================================================================== For clinical consultation, please call [phone]. ====================================================================       ROS  Constitutional: Denies any fever or chills Gastrointestinal: No reported hemesis, hematochezia, vomiting, or acute GI distress Musculoskeletal: Denies any acute onset joint swelling, redness, loss of ROM, or weakness Neurological: No reported episodes of acute onset apraxia, aphasia, dysarthria, agnosia, amnesia, paralysis, loss of  coordination, or loss of consciousness  Medication Review  Cholecalciferol, Dulaglutide, HYDROcodone-acetaminophen, Melatonin, aspirin EC, cyanocobalamin, cyclobenzaprine, escitalopram, gabapentin, ibuprofen, lurasidone, meloxicam, and triamcinolone ointment  History Review  Allergy: Ms. Gfeller is allergic to phenergan [promethazine hcl]. Drug: Ms. Runion  reports no history of drug use. Alcohol:  reports no history of alcohol use. Tobacco:  reports that she has never smoked. She has never used smokeless tobacco. Social: Ms. Birkland  reports that she has never smoked. She has never used smokeless tobacco. She reports that she does not drink alcohol and does not use drugs. Medical:  has a past medical history of Allergy, Anemia, iron deficiency (04/09/2014), Arthritis, Atypical chest pain (04/09/2014), CD (contact dermatitis) (03/01/2014), Chest pain (04/09/2014), Chronic pain syndrome, Degenerative disc disease, lumbar, Depression, Diabetes mellitus without complication (HCC), Lumbar radicular pain (08/22/2015), Lumbar spinal stenosis, Neuromuscular disorder (HCC), Osteoarthritis, Vitamin B12 deficiency, and Vitamin D deficiency. Surgical: Ms. Doan  has a past surgical history that includes Lumbar spine surgery (2010); Appendectomy (2015); Cholecystectomy (2014); Cesarean  section (1990); Bunionectomy (Bilateral, 2014); Cesarean section (1991); Knee arthroscopy (Right, 2014); Knee Arthroplasty (Right, 12/07/2022); and Knee arthroscopy (Right, 03/10/2023). Family: family history includes Asthma in her father; Stroke in her mother.  Laboratory Chemistry Profile   Renal Lab Results  Component Value Date   BUN 14 11/24/2022   CREATININE 0.66 11/24/2022   GFRAA >60 09/01/2019   GFRNONAA >60 11/24/2022    Hepatic Lab Results  Component Value Date   AST 16 11/24/2022   ALT 11 11/24/2022   ALBUMIN 4.1 11/24/2022   ALKPHOS 48 11/24/2022    Electrolytes Lab Results  Component Value Date   NA  138 11/24/2022   K 3.5 11/24/2022   CL 109 11/24/2022   CALCIUM 8.7 (L) 11/24/2022   MG 1.9 09/01/2019    Bone Lab Results  Component Value Date   VD25OH 33.18 09/01/2019   25OHVITD1 34 05/07/2016   25OHVITD2 <1.0 05/07/2016   25OHVITD3 34 05/07/2016    Inflammation (CRP: Acute Phase) (ESR: Chronic Phase) Lab Results  Component Value Date   CRP 0.9 03/09/2023   ESRSEDRATE 5 03/09/2023         Note: Above Lab results reviewed.  Recent Imaging Review  DG Knee Right Port CLINICAL DATA:  232140 Total knee replacement status 232140  EXAM: PORTABLE RIGHT KNEE - 1-2 VIEW  COMPARISON:  12/21/2017  FINDINGS: Interval postsurgical changes from right total knee arthroplasty. Arthroplasty components are in their expected alignment. No periprosthetic fracture or evidence of other complication. Expected postoperative changes within the overlying soft tissues.  IMPRESSION: Status post right total knee arthroplasty without evidence of complication.  Electronically Signed   By: Duanne Guess D.O.   On: 12/07/2022 14:59 Note: Reviewed        Physical Exam  General appearance: Well nourished, well developed, and well hydrated. In no apparent acute distress Mental status: Alert, oriented x 3 (person, place, & time)       Respiratory: No evidence of acute respiratory distress Eyes: PERLA Vitals: LMP  (LMP Unknown)  BMI: Estimated body mass index is 28.19 kg/m as calculated from the following:   Height as of 08/25/23: 5\' 7"  (1.702 m).   Weight as of 08/25/23: 180 lb (81.6 kg). Ideal: Patient weight not recorded  Assessment   Diagnosis Status  No diagnosis found. Controlled Controlled Controlled   Updated Problems: No problems updated.  Plan of Care  Problem-specific:  Assessment and Plan            Ms. Laylana Gerwig has a current medication list which includes the following long-term medication(s): cyclobenzaprine, escitalopram, gabapentin,  hydrocodone-acetaminophen, hydrocodone-acetaminophen, hydrocodone-acetaminophen, meloxicam, and triamcinolone ointment.  Pharmacotherapy (Medications Ordered): No orders of the defined types were placed in this encounter.  Orders:  No orders of the defined types were placed in this encounter.  Follow-up plan:   No follow-ups on file.      Interventional Therapies  Risk  Complexity Considerations:   Estimated body mass index is 27.25 kg/m as calculated from the following:   Height as of this encounter: 5\' 7"  (1.702 m).   Weight as of this encounter: 174 lb (78.9 kg). WNL   Planned  Pending:   Diagnostic right caudal ESI #2    Under consideration:   Diagnostic bilateral lumbar facet MBB Diagnostic Caudal ESI Diagnostic Bilateral L4-5 & L5-S1Lumbar TFESI Diagnostic bilateral sacroiliac joint Blk Diagnostic bilateral hip joint injection Diagnostic bilateral genicular NB Possible Racz procedure    Completed:   Palliative Caudal ESI  x1 (02/01/2017) (100/100/80/80)   Completed by other providers:   Surgery: Right total knee replacement by Dr. Francesco Sor (12/07/2022)  Therapeutic right knee IA steroid inj. x6 (05/14/2020, 07/22/2020, 08/12/2020, 09/15/2021, 12/29/2021, 04/07/2022) by Lurline Hare, PA-C  Therapeutic left knee IA steroid inj. x4 (04/29/2020, 07/22/2020, 08/12/2020, 09/29/2021) by Lurline Hare, PA-C    Therapeutic  Palliative (PRN) options:   Palliative Caudal ESI #2    Pharmacotherapy  Nonopioids transferred 10/07/2020: Mobic, Neurontin, Flexeril, melatonin, and triamcinolone ointment.         Recent Visits No visits were found meeting these conditions. Showing recent visits within past 90 days and meeting all other requirements Future Appointments Date Type Provider Dept  11/29/23 Appointment Delano Metz, MD [facility]  Showing future appointments within next 90 days and meeting all other requirements  I discussed the assessment and  treatment plan with the patient. The patient was provided an opportunity to ask questions and all were answered. The patient agreed with the plan and demonstrated an understanding of the instructions.  Patient advised to call back or seek an in-person evaluation if the symptoms or condition worsens.  Duration of encounter: *** minutes.  Total time on encounter, as per AMA guidelines included both the face-to-face and non-face-to-face time personally spent by the physician and/or other qualified health care professional(s) on the day of the encounter (includes time in activities that require the physician or other qualified health care professional and does not include time in activities normally performed by clinical staff). Physician's time may include the following activities when performed: Preparing to see the patient (e.g., pre-charting review of records, searching for previously ordered imaging, lab work, and nerve conduction tests) Review of prior analgesic pharmacotherapies. Reviewing PMP Interpreting ordered tests (e.g., lab work, imaging, nerve conduction tests) Performing post-procedure evaluations, including interpretation of diagnostic procedures Obtaining and/or reviewing separately obtained history Performing a medically appropriate examination and/or evaluation Counseling and educating the patient/family/caregiver Ordering medications, tests, or procedures Referring and communicating with other health care professionals (when not separately reported) Documenting clinical information in the electronic or other health record Independently interpreting results (not separately reported) and communicating results to the patient/ family/caregiver Care coordination (not separately reported)  Note by: Stephanie Done, MD Date: 11/29/2023; Time: 6:06 PM

## 2023-11-29 ENCOUNTER — Encounter: Payer: Self-pay | Admitting: Pain Medicine

## 2023-11-29 ENCOUNTER — Ambulatory Visit: Payer: No Typology Code available for payment source | Attending: Pain Medicine | Admitting: Pain Medicine

## 2023-11-29 VITALS — BP 124/69 | HR 95 | Temp 98.0°F | Resp 16 | Ht 67.0 in | Wt 180.0 lb

## 2023-11-29 DIAGNOSIS — M5442 Lumbago with sciatica, left side: Secondary | ICD-10-CM | POA: Insufficient documentation

## 2023-11-29 DIAGNOSIS — G8929 Other chronic pain: Secondary | ICD-10-CM | POA: Diagnosis present

## 2023-11-29 DIAGNOSIS — M25551 Pain in right hip: Secondary | ICD-10-CM | POA: Insufficient documentation

## 2023-11-29 DIAGNOSIS — M25552 Pain in left hip: Secondary | ICD-10-CM | POA: Diagnosis present

## 2023-11-29 DIAGNOSIS — M25561 Pain in right knee: Secondary | ICD-10-CM | POA: Insufficient documentation

## 2023-11-29 DIAGNOSIS — Z79899 Other long term (current) drug therapy: Secondary | ICD-10-CM | POA: Insufficient documentation

## 2023-11-29 DIAGNOSIS — M79605 Pain in left leg: Secondary | ICD-10-CM | POA: Insufficient documentation

## 2023-11-29 DIAGNOSIS — M961 Postlaminectomy syndrome, not elsewhere classified: Secondary | ICD-10-CM | POA: Diagnosis present

## 2023-11-29 DIAGNOSIS — M5441 Lumbago with sciatica, right side: Secondary | ICD-10-CM | POA: Diagnosis present

## 2023-11-29 DIAGNOSIS — M25562 Pain in left knee: Secondary | ICD-10-CM | POA: Diagnosis present

## 2023-11-29 DIAGNOSIS — M79604 Pain in right leg: Secondary | ICD-10-CM | POA: Insufficient documentation

## 2023-11-29 DIAGNOSIS — G894 Chronic pain syndrome: Secondary | ICD-10-CM | POA: Diagnosis present

## 2023-11-29 DIAGNOSIS — Z79891 Long term (current) use of opiate analgesic: Secondary | ICD-10-CM | POA: Insufficient documentation

## 2023-11-29 DIAGNOSIS — M542 Cervicalgia: Secondary | ICD-10-CM | POA: Insufficient documentation

## 2023-11-29 DIAGNOSIS — M79621 Pain in right upper arm: Secondary | ICD-10-CM | POA: Diagnosis present

## 2023-11-29 DIAGNOSIS — M79622 Pain in left upper arm: Secondary | ICD-10-CM | POA: Insufficient documentation

## 2023-11-29 MED ORDER — HYDROCODONE-ACETAMINOPHEN 10-325 MG PO TABS: 0.5000 | ORAL_TABLET | Freq: Two times a day (BID) | ORAL | 0 refills | Status: DC | PRN

## 2023-11-29 MED ORDER — NALOXONE HCL 4 MG/0.1ML NA LIQD: 1.0000 | NASAL | 1 refills | Status: AC | PRN

## 2023-11-29 NOTE — Patient Instructions (Signed)
______________________________________________________________________    National Pain Medication Shortage  The [place] is experiencing worsening drug shortages. These have had a negative widespread effect on patient care and treatment. Not expected to improve any time soon. Predicted to last past 2029.   Drug shortage list (generic names) Oxycodone IR Oxycodone/APAP Oxymorphone IR Hydromorphone Hydrocodone/APAP Morphine  Where is the problem?  Manufacturing and supply level.  Will this shortage affect you?  Only if you take any of the above pain medications.  How? You may be unable to fill your prescription.  Your pharmacist may offer a "partial fill" of your prescription. (Warning: Do not accept partial fills.) Prescriptions partially filled cannot be transferred to another pharmacy. Read our Medication Rules and Regulation. Depending on how much medicine you are dependent on, you may experience withdrawals when unable to get the medication.  Recommendations: Consider ending your dependence on opioid pain medications. Ask your pain specialist to assist you with the process. Consider switching to a medication currently not in shortage, such as Buprenorphine. Talk to your pain specialist about this option. Consider decreasing your pain medication requirements by managing tolerance thru "Drug Holidays". This may help minimize withdrawals, should you run out of medicine. Control your pain thru the use of non-pharmacological interventional therapies.   Your prescriber: Prescribers cannot be blamed for shortages. Medication manufacturing and supply issues cannot be fixed by the prescriber.   NOTE: The prescriber is not responsible for supplying the medication, or solving supply issues. Work with your pharmacist to solve it. The patient is responsible for the decision to take or continue taking the medication and for identifying and securing a legal supply source. By law, supplying the  medication is the job and responsibility of the pharmacy. The prescriber is responsible for the evaluation, monitoring, and prescribing of these medications.   Prescribers will NOT: Re-issue prescriptions that have been partially filled. Re-issue prescriptions already sent to a pharmacy.  Re-send prescriptions to a different pharmacy because yours did not have your medication. Ask pharmacist to order more medicine or transfer the prescription to another pharmacy. (Read below.)  New 2023 regulation: "July 03, 2022 Revised Regulation Allows DEA-Registered Pharmacies to Transfer Electronic Prescriptions at a Patient's Request DEA Headquarters Division - Public Information Office Patients now have the ability to request their electronic prescription be transferred to another pharmacy without having to go back to their practitioner to initiate the request. This revised regulation went into effect on Monday, June 29, 2022.     At a patient's request, a DEA-registered retail pharmacy can now transfer an electronic prescription for a controlled substance (schedules II-V) to another DEA-registered retail pharmacy. Prior to this change, patients would have to go through their practitioner to cancel their prescription and have it re-issued to a different pharmacy. The process was taxing and time consuming for both patients and practitioners.    The Drug Enforcement Administration [facility]) published its intent to revise the process for transferring electronic prescriptions on September 20, 2020.  The final rule was published in the federal register on May 28, 2022 and went into effect 30 days later.  Under the final rule, a prescription can only be transferred once between pharmacies, and only if allowed under existing state or other applicable law. The prescription must remain in its electronic form; may not be altered in any way; and the transfer must be communicated directly between two licensed  pharmacists. It's important to note, any authorized refills transfer with the original prescription, which means the entire  prescription will be filled at the same pharmacy".  Reference: [email or web address] [facility] website announcement)  [email or web address] Financial planner of Justice)   [employer] / Vol. 88, No. 143 / Thursday, May 28, 2022 / Rules and Regulations DEPARTMENT OF JUSTICE  Drug Enforcement Administration  21 CFR Part 1306  [Docket No. [id number]]  RIN [id number] Transfer of Electronic Prescriptions for Schedules II-V Controlled Substances Between Pharmacies for Initial Filling  ______________________________________________________________________       ______________________________________________________________________    Transfer of Pain Medication between Pharmacies  Re: 2023 DEA Clarification on existing regulation  Published on DEA Website: July 03, 2022  Title: Revised Regulation Allows DEA-Registered Pharmacies to Electrical engineer Prescriptions at a Patient's Request DEA Headquarters Division - [employer]  "Patients now have the ability to request their electronic prescription be transferred to another pharmacy without having to go back to their practitioner to initiate the request. This revised regulation went into effect on Monday, June 29, 2022.     At a patient's request, a DEA-registered retail pharmacy can now transfer an electronic prescription for a controlled substance (schedules II-V) to another DEA-registered retail pharmacy. Prior to this change, patients would have to go through their practitioner to cancel their prescription and have it re-issued to a different pharmacy. The process was taxing and time consuming for both patients and practitioners.    The Drug  Enforcement Administration [facility]) published its intent to revise the process for transferring electronic prescriptions on September 20, 2020.  The final rule was published in the federal register on May 28, 2022 and went into effect 30 days later.  Under the final rule, a prescription can only be transferred once between pharmacies, and only if allowed under existing state or other applicable law. The prescription must remain in its electronic form; may not be altered in any way; and the transfer must be communicated directly between two licensed pharmacists. It's important to note, any authorized refills transfer with the original prescription, which means the entire prescription will be filled at the same pharmacy."    REFERENCES: 1. DEA website announcement [email or web address]  2. Department of Justice website  [email or web address]  3. DEPARTMENT OF JUSTICE Drug Enforcement Administration 21 CFR Part 1306 [Docket No. [id number]] RIN [id number] "Transfer of Electronic Prescriptions for Schedules II-V Controlled Substances Between Pharmacies for Initial Filling"  ______________________________________________________________________       ______________________________________________________________________    Medication Rules  Purpose: To inform patients, and their family members, of our medication rules and regulations.  Applies to: All patients receiving prescriptions from our practice (written or electronic).  Pharmacy of record: This is the pharmacy where your electronic prescriptions will be sent. Make sure we have the correct one.  Electronic prescriptions: In compliance with the [facility] Strengthen Opioid Misuse Prevention (STOP) Act of 2017 (Session Conni Elliot [phone]), effective November 02, 2018, all controlled substances must be  electronically prescribed. Written prescriptions, faxing, or calling prescriptions to a pharmacy will no longer be done.  Prescription refills: These will be provided only during in-person appointments. No medications will be renewed without a "face-to-face" evaluation with your provider. Applies to all prescriptions.  NOTE: The following applies primarily to controlled substances (Opioid* Pain Medications).   Type of encounter (visit): For patients receiving controlled substances, face-to-face visits are required. (Not an option and not up to the patient.)  Patient's Responsibilities: Pain Pills: Bring all pain pills to every appointment (except for procedure appointments). Pill counts are required.  Pill Bottles: Bring pills in  original pharmacy bottle. Bring bottle, even if empty. Always bring the bottle of the most recent fill.  Medication refills: You are responsible for knowing and keeping track of what medications you are taking and when is it that you will need a refill. The day before your appointment: write a list of all prescriptions that need to be refilled. The day of the appointment: give the list to the admitting nurse. Prescriptions will be written only during appointments. No prescriptions will be written on procedure days. If you forget a medication: it will not be "Called in", "Faxed", or "electronically sent". You will need to get another appointment to get these prescribed. No early refills. Do not call asking to have your prescription filled early. Partial  or short prescriptions: Occasionally your pharmacy may not have enough pills to fill your prescription.  NEVER ACCEPT a partial fill or a prescription that is short of the total amount of pills that you were prescribed.  With controlled substances the law allows 72 hours for the pharmacy to complete the prescription.  If the prescription is not completed within 72 hours, the pharmacist will require a new prescription to be  written. This means that you will be short on your medicine and we WILL NOT send another prescription to complete your original prescription.  Instead, request the pharmacy to send a carrier to a nearby branch to get enough medication to provide you with your full prescription. Prescription Accuracy: You are responsible for carefully inspecting your prescriptions before leaving our office. Have the discharge nurse carefully go over each prescription with you, before taking them home. Make sure that your name is accurately spelled, that your address is correct. Check the name and dose of your medication to make sure it is accurate. Check the number of pills, and the written instructions to make sure they are clear and accurate. Make sure that you are given enough medication to last until your next medication refill appointment. Taking Medication: Take medication as prescribed. When it comes to controlled substances, taking less pills or less frequently than prescribed is permitted and encouraged. Never take more pills than instructed. Never take the medication more frequently than prescribed.  Inform other Doctors: Always inform, all of your healthcare providers, of all the medications you take. Pain Medication from other Providers: You are not allowed to accept any additional pain medication from any other Doctor or Healthcare provider. There are two exceptions to this rule. (see below) In the event that you require additional pain medication, you are responsible for notifying us, as stated below. Cough Medicine: Often these contain an opioid, such as codeine or hydrocodone. Never accept or take cough medicine containing these opioids if you are already taking an opioid* medication. The combination may cause respiratory failure and death. Medication Agreement: You are responsible for carefully reading and following our Medication Agreement. This must be signed before receiving any prescriptions from our  practice. Safely store a copy of your signed Agreement. Violations to the Agreement will result in no further prescriptions. (Additional copies of our Medication Agreement are available upon request.) Laws, Rules, & Regulations: All patients are expected to follow all [address] and [employer], Rules, [employer]. Ignorance of the Laws does not constitute a valid excuse.  Illegal drugs and Controlled Substances: The use of illegal substances (including, but not limited to marijuana and its derivatives) and/or the illegal use of any controlled substances is strictly prohibited. Violation of this rule may result in the immediate  and permanent discontinuation of any and all prescriptions being written by our practice. The use of any illegal substances is prohibited. Adopted CDC guidelines & recommendations: Target dosing levels will be at or below 60 MME/day. Use of benzodiazepines** is not recommended. Urine Drug testing: Patients taking controlled substances will be required to provide a urine sample upon request. Do not void before coming to your medication management appointments. Hold emptying your bladder until you are admitted. The admitting nurse will inform you if a sample is required. Our practice reserves the right to call you at any time to provide a sample. Once receiving the call, you have 24 hours to comply with request. Not providing a sample upon request may result in termination of medication therapy.  Exceptions: There are only two exceptions to the rule of not receiving pain medications from other Healthcare Providers. Exception #1 (Emergencies): In the event of an emergency (i.e.: accident requiring emergency care), you are allowed to receive additional pain medication. However, you are responsible for: As soon as you are able, call our office [phone], at any time of the day or night, and leave a message stating your name, the date and nature of the emergency, and the name  and dose of the medication prescribed. In the event that your call is answered by a member of our staff, make sure to document and save the date, time, and the name of the person that took your information.  Exception #2 (Planned Surgery): In the event that you are scheduled by another doctor or dentist to have any type of surgery or procedure, you are allowed (for a period no longer than 30 days), to receive additional pain medication, for the acute post-op pain. However, in this case, you are responsible for picking up a copy of our "Post-op Pain Management for Surgeons" handout, and giving it to your surgeon or dentist. This document is available at our office, and does not require an appointment to obtain it. Simply go to our office during business hours (Monday-Thursday from 8:00 AM to 4:00 PM) (Friday 8:00 AM to 12:00 Noon) or if you have a scheduled appointment with [place], prior to your surgery, and ask for it by name. In addition, you are responsible for: calling our office [phone], at any time of the day or night, and leaving a message stating your name, name of your surgeon, type of surgery, and date of procedure or surgery. Failure to comply with your responsibilities may result in termination of therapy involving the controlled substances.  Consequences:  Non-compliance with the above rules may result in permanent discontinuation of medication prescription therapy. All patients receiving any type of controlled substance is expected to comply with the above patient responsibilities. Not doing so may result in permanent discontinuation of medication prescription therapy. Medication Agreement Violation. Following the above rules, including your responsibilities will help you in avoiding a Medication Agreement Violation ("Breaking your Pain Medication Contract").  *Opioid medications include: morphine, codeine, oxycodone, oxymorphone, hydrocodone, hydromorphone, meperidine, tramadol, tapentadol,  buprenorphine, fentanyl, methadone. **Benzodiazepine medications include: diazepam (Valium), alprazolam (Xanax), clonazepam (Klonopine), lorazepam (Ativan), clorazepate (Tranxene), chlordiazepoxide (Librium), estazolam (Prosom), oxazepam (Serax), temazepam (Restoril), triazolam (Halcion) (Last updated: 08/25/2023) ______________________________________________________________________      ______________________________________________________________________    Medication Recommendations and Reminders  Applies to: All patients receiving prescriptions (written and/or electronic).  Medication Rules & Regulations: You are responsible for reading, knowing, and following our "Medication Rules" document. These exist for your safety and that of others. They are  not flexible and neither are we. Dismissing or ignoring them is an act of "non-compliance" that may result in complete and irreversible termination of such medication therapy. For safety reasons, "non-compliance" will not be tolerated. As with the [place] fundamental legal principle of "ignorance of the law is no defense", we will accept no excuses for not having read and knowing the content of documents provided to you by our practice.  Pharmacy of record:  Definition: This is the pharmacy where your electronic prescriptions will be sent.  We do not endorse any particular pharmacy. It is up to you and your insurance to decide what pharmacy to use.  We do not restrict you in your choice of pharmacy. However, once we write for your prescriptions, we will NOT be re-sending more prescriptions to fix restricted supply problems created by your pharmacy, or your insurance.  The pharmacy listed in the electronic medical record should be the one where you want electronic prescriptions to be sent. If you choose to change pharmacy, simply notify our nursing staff. Changes will be made only during your regular appointments and not over the  phone.  Recommendations: Keep all of your pain medications in a safe place, under lock and key, even if you live alone. We will NOT replace lost, stolen, or damaged medication. We do not accept "Police Reports" as proof of medications having been stolen. After you fill your prescription, take 1 week's worth of pills and put them away in a safe place. You should keep a separate, properly labeled bottle for this purpose. The remainder should be kept in the original bottle. Use this as your primary supply, until it runs out. Once it's gone, then you know that you have 1 week's worth of medicine, and it is time to come in for a prescription refill. If you do this correctly, it is unlikely that you will ever run out of medicine. To make sure that the above recommendation works, it is very important that you make sure your medication refill appointments are scheduled at least 1 week before you run out of medicine. To do this in an effective manner, make sure that you do not leave the office without scheduling your next medication management appointment. Always ask the nursing staff to show you in your prescription , when your medication will be running out. Then arrange for the receptionist to get you a return appointment, at least 7 days before you run out of medicine. Do not wait until you have 1 or 2 pills left, to come in. This is very poor planning and does not take into consideration that we may need to cancel appointments due to bad weather, sickness, or emergencies affecting our staff. DO NOT ACCEPT A "Partial Fill": If for any reason your pharmacy does not have enough pills/tablets to completely fill or refill your prescription, do not allow for a "partial fill". The law allows the pharmacy to complete that prescription within 72 hours, without requiring a new prescription. If they do not fill the rest of your prescription within those 72 hours, you will need a separate prescription to fill the remaining  amount, which we will NOT provide. If the reason for the partial fill is your insurance, you will need to talk to the pharmacist about payment alternatives for the remaining tablets, but again, DO NOT ACCEPT A PARTIAL FILL, unless you can trust your pharmacist to obtain the remainder of the pills within 72 hours.  Prescription refills and/or changes in medication(s):  Prescription refills, and/or changes in dose or medication, will be conducted only during scheduled medication management appointments. (Applies to both, written and electronic prescriptions.) No refills on procedure days. No medication will be changed or started on procedure days. No changes, adjustments, and/or refills will be conducted on a procedure day. Doing so will interfere with the diagnostic portion of the procedure. No phone refills. No medications will be "called into the pharmacy". No Fax refills. No weekend refills. No Holliday refills. No after hours refills.  Remember:  Business hours are:  Monday to Thursday 8:00 AM to 4:00 PM Provider's Schedule: Delano Metz, MD - Appointments are:  Medication management: Monday and Wednesday 8:00 AM to 4:00 PM Procedure day: Tuesday and Thursday 7:30 AM to 4:00 PM Edward Jolly, MD - Appointments are:  Medication management: Tuesday and Thursday 8:00 AM to 4:00 PM Procedure day: Monday and Wednesday 7:30 AM to 4:00 PM (Last update: 08/25/2022) ______________________________________________________________________      ______________________________________________________________________    Drug Holidays  What is a "Drug Holiday"? Drug Holiday: is the name given to the process of slowly tapering down and temporarily stopping the pain medication for the purpose of decreasing or eliminating tolerance to the drug.  Benefits Improved effectiveness Decreased required effective dose Improved pain control End dependence on high dose therapy Decrease cost of  therapy Uncovering "opioid-induced hyperalgesia". (OIH)  What is "opioid hyperalgesia"? It is a paradoxical increase in pain caused by exposure to opioids. Stopping the opioid pain medication, contrary to the expected, it actually decreases or completely eliminates the pain. Ref.: "A comprehensive review of opioid-induced hyperalgesia". Donney Rankins, et.al. Pain Physician. 2011 Mar-Apr;14(2):145-61.  What is tolerance? Tolerance: the progressive loss of effectiveness of a pain medicine due to repetitive use. A common problem of opioid pain medications.  How long should a "Drug Holiday" last? Effectiveness depends on the patient staying off all opioid pain medicines for a minimum of 14 consecutive days. (2 weeks) During this time the patient should not be taking any other opioid analgesic medication.  How about just taking less of the medicine? Does not work. Will not accomplish goal of eliminating the excess receptors.  How about switching to a different pain medicine? (AKA. "Opioid rotation") Does not work. Creates the illusion of effectiveness by taking advantage of inaccurate equivalent dose calculations between different opioids. -This "technique" was promoted by studies funded by [employer], such as [employer], creators of "OxyContin".  Can I stop the medicine "cold [place]"? We do not recommend it. You should always coordinate with your prescribing physician to make the transition as smoothly as possible. Avoid stopping the medicine abruptly without consulting. We recommend a "slow taper".  What is a slow taper? Taper: refers to the gradual decrease in dose.   How do I stop/taper the dose? Slowly. Decrease the daily amount of pills that you take by one (1) pill every seven (7) days. This is called a "slow downward taper". Example: if you normally take four (4) pills per day, drop it to three (3) pills per day for seven (7) days, then to two (2) pills per day for seven  (7) days, then to one (1) per day for seven (7) days, and then stop the medicine. The 14 day "Drug Holiday" starts on the first day without medicine.   Will I experience withdrawals? Unlikely with a slow taper.  What triggers withdrawals? Withdrawals are triggered by the sudden/abrupt stop of high dose opioids. Withdrawals can be avoided by slowly decreasing  the dose over a prolonged period of time.  What are withdrawals? Symptoms associated with sudden/abrupt reduction/stopping of high-dose, long-term use of pain medication. Withdrawal are seldom seen on low dose therapy, or patients rarely taking opioid medication.  Early Withdrawal Symptoms may include: Agitation Anxiety Muscle aches Increased tearing Insomnia Runny nose Sweating Yawning  Late symptoms may include: Abdominal cramping Diarrhea Dilated pupils Goose bumps Nausea Vomiting  When could I see withdrawals? Onset: 8-24 hours after last use for most opioids. 12-48 hours for long-acting opioids (i.e.: methadone)  How long could they last? Duration: 4-10 days for most opioids. 14-21 days for long-acting opioids (i.e.: methadone)  What will happen after I complete my "Drug Holiday"? The need and indications for the opioid analgesic will be reviewed before restarting the medication. Dose requirements will likely decrease and the dose will need to be adjusted accordingly.   (Last update: 08/25/2023) ______________________________________________________________________      ______________________________________________________________________     Naloxone Nasal Spray  Why am I receiving this medication? [place] STOP ACT requires that all patients taking high dose opioids or at risk of opioids respiratory depression, be prescribed an opioid reversal agent, such as Naloxone (AKA: Narcan).  What is this medication? NALOXONE (nal OX one) treats opioid overdose, which causes slow or shallow breathing,  severe drowsiness, or trouble staying awake. Call emergency services after using this medication. You may need additional treatment. Naloxone works by reversing the effects of opioids. It belongs to a group of medications called opioid blockers.  COMMON BRAND NAME(S): Kloxxado, Narcan  What should I tell my care team before I take this medication? They need to know if you have any of these conditions: Heart disease Substance use disorder An unusual or allergic reaction to naloxone, other medications, foods, dyes, or preservatives Pregnant or trying to get pregnant Breast-feeding  When to use this medication? This medication is to be used for the treatment of respiratory depression (less than 8 breaths per minute) secondary to opioid overdose.   How to use this medication? This medication is for use in the nose. Lay the person on their back. Support their neck with your hand and allow the head to tilt back before giving the medication. The nasal spray should be given into 1 nostril. After giving the medication, move the person onto their side. Do not remove or test the nasal spray until ready to use. Get emergency medical help right away after giving the first dose of this medication, even if the person wakes up. You should be familiar with how to recognize the signs and symptoms of a narcotic overdose. If more doses are needed, give the additional dose in the other nostril. Talk to your care team about the use of this medication in children. While this medication may be prescribed for children as young as newborns for selected conditions, precautions do apply.  Naloxone Overdosage: If you think you have taken too much of this medicine contact a poison control center or emergency room at once.  NOTE: This medicine is only for you. Do not share this medicine with others.  What if I miss a dose? This does not apply.  What may interact with this medication? This is only used during an  emergency. No interactions are expected during emergency use. This list may not describe all possible interactions. Give your health care provider a list of all the medicines, herbs, non-prescription drugs, or dietary supplements you use. Also tell them if you smoke, drink alcohol, or  use illegal drugs. Some items may interact with your medicine.  What should I watch for while using this medication? Keep this medication ready for use in the case of an opioid overdose. Make sure that you have the phone number of your care team and local hospital ready. You may need to have additional doses of this medication. Each nasal spray contains a single dose. Some emergencies may require additional doses. After use, bring the treated person to the nearest hospital or call 911. Make sure the treating care team knows that the person has received a dose of this medication. You will receive additional instructions on what to do during and after use of this medication before an emergency occurs.  What side effects may I notice from receiving this medication? Side effects that you should report to your care team as soon as possible: Allergic reactions--skin rash, itching, hives, swelling of the face, lips, tongue, or throat Side effects that usually do not require medical attention (report these to your care team if they continue or are bothersome): Constipation Dryness or irritation inside the nose Headache Increase in blood pressure Muscle spasms Stuffy nose Toothache This list may not describe all possible side effects. Call your doctor for medical advice about side effects. You may report side effects to FDA at 1-800-FDA-1088.  Where should I keep my medication? Because this is an emergency medication, you should keep it with you at all times.  Keep out of the reach of children and pets. Store between 20 and 25 degrees C (68 and 77 degrees F). Do not freeze. Throw away any unused medication after the  expiration date. Keep in original box until ready to use.  NOTE: This sheet is a summary. It may not cover all possible information. If you have questions about this medicine, talk to your doctor, pharmacist, or health care provider.   2023 [employer]/Gold Standard (2021-06-27 00:00:00)  ______________________________________________________________________

## 2023-11-29 NOTE — Progress Notes (Signed)
Nursing Pain Medication Assessment:  Safety precautions to be maintained throughout the outpatient stay will include: orient to surroundings, keep bed in low position, maintain call bell within reach at all times, provide assistance with transfer out of bed and ambulation.  Medication Inspection Compliance: Pill count conducted under aseptic conditions, in front of the patient. Neither the pills nor the bottle was removed from the patient's sight at any time. Once count was completed pills were immediately returned to the patient in their original bottle.  Medication: Hydrocodone/APAP Pill/Patch Count:  5.5 of 30 pills remain Pill/Patch Appearance: Markings consistent with prescribed medication Bottle Appearance: Standard pharmacy container. Clearly labeled. Filled Date: 1 / 3 / 2025 Last Medication intake:  Today

## 2024-02-21 ENCOUNTER — Encounter: Payer: Medicare Other | Admitting: Pain Medicine

## 2024-02-27 NOTE — Patient Instructions (Signed)
 ______________________________________________________________________    New Medication Management Provider - Marthe Slain, NP  Purpose: To inform patients of the addition of a new member to our group, Marthe Slain, NP.  Applies to: All patients receiving prescriptions from our practice (written or electronic).  Announcement: We are happy to announce the addition of Marthe Slain, NP to or practice ([facility]).  She will take up a support role assisting our interventional pain specialists in the management of our patients.  She will be primarily assigned to the medication management portion of our practice.  Physician assignment: Patient will continue to be assigned to their current Pain Specialist Physician however, Ms. Lydia Sams, NP will take over the Medication Management visits along with the responsibilities associated with such visits.  Medication Management: Any questions or inquiries associated with the day-to-day management of your pain medications, refills, or anything else associated with those prescriptions should be directed to Ms. Marthe Slain, NP.  Interventional Therapies: All issues associated with these therapies will continue to be managed by your Primary Pain Specialist.   Requesting appointments: When requesting that appointment, please make sure to specify whether the appointment is for Medication Management (to be scheduled with Ms. Lydia Sams, NP) or if an evaluation is required/desired with your Primary Pain Specialist.  (Last updated: 02/01/2024) ______________________________________________________________________      ______________________________________________________________________    Opioid Pain Medication Update  To: All patients taking opioid pain medications. (I.e.: hydrocodone , hydromorphone, oxycodone , oxymorphone, morphine , codeine, methadone, tapentadol, tramadol, buprenorphine, fentanyl , etc.)  Re:  Updated review of side effects and adverse reactions of opioid analgesics, as well as new information about long term effects of this class of medications.  Direct risks of long-term opioid therapy are not limited to opioid addiction and overdose. Potential medical risks include serious fractures, breathing problems during sleep, hyperalgesia, immunosuppression, chronic constipation, bowel obstruction, myocardial infarction, and tooth decay secondary to xerostomia.  Unpredictable adverse effects that can occur even if you take your medication correctly: Cognitive impairment, respiratory depression, and death. Most people think that if they take their medication "correctly", and "as instructed", that they will be safe. Nothing could be farther from the truth. In reality, a significant amount of recorded deaths associated with the use of opioids has occurred in individuals that had taken the medication for a long time, and were taking their medication correctly. The following are examples of how this can happen: Patient taking his/her medication for a long time, as instructed, without any side effects, is given a certain antibiotic or another unrelated medication, which in turn triggers a "Drug-to-drug interaction" leading to disorientation, cognitive impairment, impaired reflexes, respiratory depression or an untoward event leading to serious bodily harm or injury, including death.  Patient taking his/her medication for a long time, as instructed, without any side effects, develops an acute impairment of liver and/or kidney function. This will lead to a rapid inability of the body to breakdown and eliminate their pain medication, which will result in effects similar to an "overdose", but with the same medicine and dose that they had always taken. This again may lead to disorientation, cognitive impairment, impaired reflexes, respiratory depression or an untoward event leading to serious bodily harm or injury,  including death.  A similar problem will occur with patients as they grow older and their liver and kidney function begins to decrease as part of the aging process.  Background information: Historically, the original case for using long-term opioid therapy to treat chronic noncancer pain was  based on safety assumptions that subsequent experience has called into question. In 1996, the American Pain Society and the American Academy of Pain Medicine issued a consensus statement supporting long-term opioid therapy. This statement acknowledged the dangers of opioid prescribing but concluded that the risk for addiction was low; respiratory depression induced by opioids was short-lived, occurred mainly in opioid-naive patients, and was antagonized by pain; tolerance was not a common problem; and efforts to control diversion should not constrain opioid prescribing. This has now proven to be wrong. Experience regarding the risks for opioid addiction, misuse, and overdose in community practice has failed to support these assumptions.  According to the Centers for Disease Control and Prevention, fatal overdoses involving opioid analgesics have increased sharply over the past decade. Currently, more than 96,700 people die from drug overdoses every year. Opioids are a factor in 7 out of every 10 overdose deaths. Deaths from drug overdose have surpassed motor vehicle accidents as the leading cause of death for individuals between the ages of 38 and 20.  Clinical data suggest that neuroendocrine dysfunction may be very common in both men and women, potentially causing hypogonadism, erectile dysfunction, infertility, decreased libido, osteoporosis, and depression. Recent studies linked higher opioid dose to increased opioid-related mortality. Controlled observational studies reported that long-term opioid therapy may be associated with increased risk for cardiovascular events. Subsequent meta-analysis concluded that the  safety of long-term opioid therapy in elderly patients has not been proven.   Side Effects and adverse reactions: Common side effects: Drowsiness (sedation). Dizziness. Nausea and vomiting. Constipation. Physical dependence -- Dependence often manifests with withdrawal symptoms when opioids are discontinued or decreased. Tolerance -- As you take repeated doses of opioids, you require increased medication to experience the same effect of pain relief. Respiratory depression -- This can occur in healthy people, especially with higher doses. However, people with COPD, asthma or other lung conditions may be even more susceptible to fatal respiratory impairment.  Uncommon side effects: An increased sensitivity to feeling pain and extreme response to pain (hyperalgesia). Chronic use of opioids can lead to this. Delayed gastric emptying (the process by which the contents of your stomach are moved into your small intestine). Muscle rigidity. Immune system and hormonal dysfunction. Quick, involuntary muscle jerks (myoclonus). Arrhythmia. Itchy skin (pruritus). Dry mouth (xerostomia).  Long-term side effects: Chronic constipation. Sleep-disordered breathing (SDB). Increased risk of bone fractures. Hypothalamic-pituitary-adrenal dysregulation. Increased risk of overdose.  RISKS: Respiratory depression and death: Opioids increase the risk of respiratory depression and death.  Drug-to-drug interactions: Opioids are relatively contraindicated in combination with benzodiazepines, sleep inducers, and other central nervous system depressants. Other classes of medications (i.e.: certain antibiotics and even over-the-counter medications) may also trigger or induce respiratory depression in some patients.  Medical conditions: Patients with pre-existing respiratory problems are at higher risk of respiratory failure and/or depression when in combination with opioid analgesics. Opioids are relatively  contraindicated in some medical conditions such as central sleep apnea.   Fractures and Falls:  Opioids increase the risk and incidence of falls. This is of particular importance in elderly patients.  Endocrine System:  Long-term administration is associated with endocrine abnormalities (endocrinopathies). (Also known as Opioid-induced Endocrinopathy) Influences on both the hypothalamic-pituitary-adrenal axis?and the hypothalamic-pituitary-gonadal axis have been demonstrated with consequent hypogonadism and adrenal insufficiency in both sexes. Hypogonadism and decreased levels of dehydroepiandrosterone sulfate have been reported in men and women. Endocrine effects include: Amenorrhoea in women (abnormal absence of menstruation) Reduced libido in both sexes Decreased sexual function Erectile dysfunction  in men Hypogonadisms (decreased testicular function with shrinkage of testicles) Infertility Depression and fatigue Loss of muscle mass Anxiety Depression Immune suppression Hyperalgesia Weight gain Anemia Osteoporosis Patients (particularly women of childbearing age) should avoid opioids. There is insufficient evidence to recommend routine monitoring of asymptomatic patients taking opioids in the long-term for hormonal deficiencies.  Immune System: Human studies have demonstrated that opioids have an immunomodulating effect. These effects are mediated via opioid receptors both on immune effector cells and in the central nervous system. Opioids have been demonstrated to have adverse effects on antimicrobial response and anti-tumour surveillance. Buprenorphine has been demonstrated to have no impact on immune function.  Opioid Induced Hyperalgesia: Human studies have demonstrated that prolonged use of opioids can lead to a state of abnormal pain sensitivity, sometimes called opioid induced hyperalgesia (OIH). Opioid induced hyperalgesia is not usually seen in the absence of tolerance  to opioid analgesia. Clinically, hyperalgesia may be diagnosed if the patient on long-term opioid therapy presents with increased pain. This might be qualitatively and anatomically distinct from pain related to disease progression or to breakthrough pain resulting from development of opioid tolerance. Pain associated with hyperalgesia tends to be more diffuse than the pre-existing pain and less defined in quality. Management of opioid induced hyperalgesia requires opioid dose reduction.  Cancer: Chronic opioid therapy has been associated with an increased risk of cancer among noncancer patients with chronic pain. This association was more evident in chronic strong opioid users. Chronic opioid consumption causes significant pathological changes in the small intestine and colon. Epidemiological studies have found that there is a link between opium  dependence and initiation of gastrointestinal cancers. Cancer is the second leading cause of death after cardiovascular disease. Chronic use of opioids can cause multiple conditions such as GERD, immunosuppression and renal damage as well as carcinogenic effects, which are associated with the incidence of cancers.   Mortality: Long-term opioid use has been associated with increased mortality among patients with chronic non-cancer pain (CNCP).  Prescription of long-acting opioids for chronic noncancer pain was associated with a significantly increased risk of all-cause mortality, including deaths from causes other than overdose.  Reference: Von Korff M, Kolodny A, Deyo RA, Chou R. Long-term opioid therapy reconsidered. Ann Intern Med. 2011 Sep 6;155(5):325-8. doi: 10.7326/[id number]. PMID: 16109604; PMCID: [id number]. Achilles Achilles, Hayward RA, Dunn KM, [place] KP. Risk of adverse events in patients prescribed long-term opioids: A cohort study in the [place] Clinical Practice Research Datalink. Eur J Pain. 2019 May;23(5):908-922.  doi: 10.1002/ejp.1357. Epub 2019 Jan 31. PMID: 78295621. Colameco S, Coren JS, Ciervo CA. Continuous opioid treatment for chronic noncancer pain: a time for moderation in prescribing. Postgrad Med. 2009 Jul;121(4):61-6. doi: 10.3810/pgm.2009.07.2032. PMID: 30865784. Orlan Bis RN, Ferry SD, Blazina I, Sheliah Deutscher, Bougatsos C, Deyo RA. The effectiveness and risks of long-term opioid therapy for chronic pain: a systematic review for a [employer] of [employer] Pathways to [employer]. Ann Intern Med. 2015 Feb 17;162(4):276-86. doi: 10.7326/M14-2559. PMID: 69629528. [facility], Makuc DM. NCHS Data Brief No. 22. [place]: Centers for Disease Control and Prevention; 2009. Sep, Increase in Fatal Poisonings Involving Opioid Analgesics in the [place] , 1999-2006. Song IA, Choi HR, Oh TK. Long-term opioid use and mortality in patients with chronic non-cancer pain: Ten-year follow-up study in [place] from 2010 through 2019. EClinicalMedicine. 2022 Jul 18;51:101558. doi: 10.1016/j.eclinm.2022.413244. PMID: 01027253; PMCID: [id number]. Huser, WAleta Anda, T., Vogelmann, T. et al. All-cause mortality in patients with long-term opioid  therapy compared with non-opioid analgesics for chronic non-cancer pain: a database study. BMC Med 18, 162 (2020). http://lester.info/ Rashidian H, Zendehdel K, Kamangar F, Malekzadeh R, Haghdoost AA. An Ecological Study of the Association between Opiate Use and Incidence of Cancers. Addict Health. 2016 Fall;8(4):252-260. PMID: 82956213; PMCID: [id number].  Our Goal: Our goal is to control your pain with means other than the use of opioid pain medications.  Our Recommendation: Talk to your physician about coming off of these medications. We can assist you with the tapering down and stopping these medicines. Based on the new information, even if you cannot completely stop the medication, a decrease in the dose  may be associated with a lesser risk. Ask for other means of controlling the pain. Decrease or eliminate those factors that significantly contribute to your pain such as smoking, obesity, and a diet heavily tilted towards "inflammatory" nutrients.  Last Updated: 05/10/2023   ______________________________________________________________________       ______________________________________________________________________    National Pain Medication Shortage  The [place] is experiencing worsening drug shortages. These have had a negative widespread effect on patient care and treatment. Not expected to improve any time soon. Predicted to last past 2029.   Drug shortage list (generic names) Oxycodone  IR Oxycodone /APAP Oxymorphone IR Hydromorphone Hydrocodone /APAP Morphine   Where is the problem?  Manufacturing and supply level.  Will this shortage affect you?  Only if you take any of the above pain medications.  How? You may be unable to fill your prescription.  Your pharmacist may offer a "partial fill" of your prescription. (Warning: Do not accept partial fills.) Prescriptions partially filled cannot be transferred to another pharmacy. Read our Medication Rules and Regulation. Depending on how much medicine you are dependent on, you may experience withdrawals when unable to get the medication.  Recommendations: Consider ending your dependence on opioid pain medications. Ask your pain specialist to assist you with the process. Consider switching to a medication currently not in shortage, such as Buprenorphine. Talk to your pain specialist about this option. Consider decreasing your pain medication requirements by managing tolerance thru "Drug Holidays". This may help minimize withdrawals, should you run out of medicine. Control your pain thru the use of non-pharmacological interventional therapies.   Your prescriber: Prescribers cannot be blamed for shortages. Medication  manufacturing and supply issues cannot be fixed by the prescriber.   NOTE: The prescriber is not responsible for supplying the medication, or solving supply issues. Work with your pharmacist to solve it. The patient is responsible for the decision to take or continue taking the medication and for identifying and securing a legal supply source. By law, supplying the medication is the job and responsibility of the pharmacy. The prescriber is responsible for the evaluation, monitoring, and prescribing of these medications.   Prescribers will NOT: Re-issue prescriptions that have been partially filled. Re-issue prescriptions already sent to a pharmacy.  Re-send prescriptions to a different pharmacy because yours did not have your medication. Ask pharmacist to order more medicine or transfer the prescription to another pharmacy. (Read below.)  New 2023 regulation: "July 03, 2022 Revised Regulation Allows DEA-Registered Pharmacies to Transfer Electronic Prescriptions at a Patient's Request DEA Headquarters Division - Public Information Office Patients now have the ability to request their electronic prescription be transferred to another pharmacy without having to go back to their practitioner to initiate the request. This revised regulation went into effect on Monday, June 29, 2022.     At a patient's request, a DEA-registered retail pharmacy can  now transfer an electronic prescription for a controlled substance (schedules II-V) to another DEA-registered retail pharmacy. Prior to this change, patients would have to go through their practitioner to cancel their prescription and have it re-issued to a different pharmacy. The process was taxing and time consuming for both patients and practitioners.    The Drug Enforcement Administration [facility]) published its intent to revise the process for transferring electronic prescriptions on September 20, 2020.  The final rule was published in the federal  register on May 28, 2022 and went into effect 30 days later.  Under the final rule, a prescription can only be transferred once between pharmacies, and only if allowed under existing state or other applicable law. The prescription must remain in its electronic form; may not be altered in any way; and the transfer must be communicated directly between two licensed pharmacists. It's important to note, any authorized refills transfer with the original prescription, which means the entire prescription will be filled at the same pharmacy".  Reference: [email or web address].is [facility] website announcement)  [email or web address] Financial planner of Justice)   [employer] / Vol. 88, No. 143 / Thursday, May 28, 2022 / Rules and Regulations DEPARTMENT OF JUSTICE  Drug Enforcement Administration  21 CFR Part 1306  [Docket No. [id number]]  RIN [id number] Transfer of Electronic Prescriptions for Schedules II-V Controlled Substances Between Pharmacies for Initial Filling  ______________________________________________________________________       ______________________________________________________________________    Transfer of Pain Medication between Pharmacies  Re: 2023 DEA Clarification on existing regulation  Published on DEA Website: July 03, 2022  Title: Revised Regulation Allows DEA-Registered Pharmacies to Electrical engineer Prescriptions at a Patient's Request DEA Headquarters Division - [employer]  "Patients now have the ability to request their electronic prescription be transferred to another pharmacy without having to go back to their practitioner to initiate the request. This revised regulation went into effect on Monday, June 29, 2022.     At a patient's request, a DEA-registered retail pharmacy can now  transfer an electronic prescription for a controlled substance (schedules II-V) to another DEA-registered retail pharmacy. Prior to this change, patients would have to go through their practitioner to cancel their prescription and have it re-issued to a different pharmacy. The process was taxing and time consuming for both patients and practitioners.    The Drug Enforcement Administration [facility]) published its intent to revise the process for transferring electronic prescriptions on September 20, 2020.  The final rule was published in the federal register on May 28, 2022 and went into effect 30 days later.  Under the final rule, a prescription can only be transferred once between pharmacies, and only if allowed under existing state or other applicable law. The prescription must remain in its electronic form; may not be altered in any way; and the transfer must be communicated directly between two licensed pharmacists. It's important to note, any authorized refills transfer with the original prescription, which means the entire prescription will be filled at the same pharmacy."    REFERENCES: 1. DEA website announcement [email or web address]  2. Department of Justice website  [email or web address]  3. DEPARTMENT OF JUSTICE Drug Enforcement Administration 21 CFR Part 1306 [Docket No. [id number]] RIN [id number]-[id number] "Transfer of Electronic Prescriptions for Schedules II-V Controlled Substances Between Pharmacies for Initial Filling"  ______________________________________________________________________       ______________________________________________________________________    Medication Rules  Purpose: To inform patients, and their family members, of our medication rules and regulations.  Applies to: All patients receiving  prescriptions from our practice  (written or electronic).  Pharmacy of record: This is the pharmacy where your electronic prescriptions will be sent. Make sure we have the correct one.  Electronic prescriptions: In compliance with the [place]  Strengthen Opioid Misuse Prevention (STOP) Act of 2017 (Session Law 2017-74/H243), effective November 02, 2018, all controlled substances must be electronically prescribed. Written prescriptions, faxing, or calling prescriptions to a pharmacy will no longer be done.  Prescription refills: These will be provided only during in-person appointments. No medications will be renewed without a "face-to-face" evaluation with your provider. Applies to all prescriptions.  NOTE: The following applies primarily to controlled substances (Opioid* Pain Medications).   Type of encounter (visit): For patients receiving controlled substances, face-to-face visits are required. (Not an option and not up to the patient.)  Patient's Responsibilities: Pain Pills: Bring all pain pills to every appointment (except for procedure appointments). Pill counts are required.  Pill Bottles: Bring pills in original pharmacy bottle. Bring bottle, even if empty. Always bring the bottle of the most recent fill.  Medication refills: You are responsible for knowing and keeping track of what medications you are taking and when is it that you will need a refill. The day before your appointment: write a list of all prescriptions that need to be refilled. The day of the appointment: give the list to the admitting nurse. Prescriptions will be written only during appointments. No prescriptions will be written on procedure days. If you forget a medication: it will not be "Called in", "Faxed", or "electronically sent". You will need to get another appointment to get these prescribed. No early refills. Do not call asking to have your prescription filled early. Partial  or short prescriptions: Occasionally your pharmacy may not have  enough pills to fill your prescription.  NEVER ACCEPT a partial fill or a prescription that is short of the total amount of pills that you were prescribed.  With controlled substances the law allows 72 hours for the pharmacy to complete the prescription.  If the prescription is not completed within 72 hours, the pharmacist will require a new prescription to be written. This means that you will be short on your medicine and we WILL NOT send another prescription to complete your original prescription.  Instead, request the pharmacy to send a carrier to a nearby branch to get enough medication to provide you with your full prescription. Prescription Accuracy: You are responsible for carefully inspecting your prescriptions before leaving our office. Have the discharge nurse carefully go over each prescription with you, before taking them home. Make sure that your name is accurately spelled, that your address is correct. Check the name and dose of your medication to make sure it is accurate. Check the number of pills, and the written instructions to make sure they are clear and accurate. Make sure that you are given enough medication to last until your next medication refill appointment. Taking Medication: Take medication as prescribed. When it comes to controlled substances, taking less pills or less frequently than prescribed is permitted and encouraged. Never take more pills than instructed. Never take the medication more frequently than prescribed.  Inform other Doctors: Always inform, all of your healthcare providers, of all the medications you take. Pain Medication from other Providers: You are not allowed to accept any additional pain medication from any other Doctor or Healthcare provider. There are two exceptions to this rule. (see below) In the event that you require additional pain medication, you are responsible for  notifying us , as stated below. Cough Medicine: Often these contain an opioid, such as  codeine or hydrocodone . Never accept or take cough medicine containing these opioids if you are already taking an opioid* medication. The combination may cause respiratory failure and death. Medication Agreement: You are responsible for carefully reading and following our Medication Agreement. This must be signed before receiving any prescriptions from our practice. Safely store a copy of your signed Agreement. Violations to the Agreement will result in no further prescriptions. (Additional copies of our Medication Agreement are available upon request.) Laws, Rules, & Regulations: All patients are expected to follow all [address] and [employer], Rules, [place] [employer]. Ignorance of the Laws does not constitute a valid excuse.  Illegal drugs and Controlled Substances: The use of illegal substances (including, but not limited to marijuana and its derivatives) and/or the illegal use of any controlled substances is strictly prohibited. Violation of this rule may result in the immediate and permanent discontinuation of any and all prescriptions being written by our practice. The use of any illegal substances is prohibited. Adopted CDC guidelines & recommendations: Target dosing levels will be at or below 60 MME/day. Use of benzodiazepines** is not recommended. Urine Drug testing: Patients taking controlled substances will be required to provide a urine sample upon request. Do not void before coming to your medication management appointments. Hold emptying your bladder until you are admitted. The admitting nurse will inform you if a sample is required. Our practice reserves the right to call you at any time to provide a sample. Once receiving the call, you have 24 hours to comply with request. Not providing a sample upon request may result in termination of medication therapy.  Exceptions: There are only two exceptions to the rule of not receiving pain medications from other Healthcare Providers. Exception  #1 (Emergencies): In the event of an emergency (i.e.: accident requiring emergency care), you are allowed to receive additional pain medication. However, you are responsible for: As soon as you are able, call our office [phone], at any time of the day or night, and leave a message stating your name, the date and nature of the emergency, and the name and dose of the medication prescribed. In the event that your call is answered by a member of our staff, make sure to document and save the date, time, and the name of the person that took your information.  Exception #2 (Planned Surgery): In the event that you are scheduled by another doctor or dentist to have any type of surgery or procedure, you are allowed (for a period no longer than 30 days), to receive additional pain medication, for the acute post-op pain. However, in this case, you are responsible for picking up a copy of our "Post-op Pain Management for Surgeons" handout, and giving it to your surgeon or dentist. This document is available at our office, and does not require an appointment to obtain it. Simply go to our office during business hours (Monday-Thursday from 8:00 AM to 4:00 PM) (Friday 8:00 AM to 12:00 Noon) or if you have a scheduled appointment with us , prior to your surgery, and ask for it by name. In addition, you are responsible for: calling our office [phone], at any time of the day or night, and leaving a message stating your name, name of your surgeon, type of surgery, and date of procedure or surgery. Failure to comply with your responsibilities may result in termination of therapy involving the controlled substances.  Consequences:  Non-compliance with the above rules may result in permanent discontinuation of medication prescription therapy. All patients receiving any type of controlled substance is expected to comply with the above patient responsibilities. Not doing so may result in permanent discontinuation of  medication prescription therapy. Medication Agreement Violation. Following the above rules, including your responsibilities will help you in avoiding a Medication Agreement Violation ("Breaking your Pain Medication Contract").  *Opioid medications include: morphine , codeine, oxycodone , oxymorphone, hydrocodone , hydromorphone, meperidine, tramadol, tapentadol, buprenorphine, fentanyl , methadone. **Benzodiazepine medications include: diazepam  (Valium ), alprazolam  (Xanax ), clonazepam (Klonopine), lorazepam (Ativan), clorazepate (Tranxene), chlordiazepoxide (Librium), estazolam (Prosom), oxazepam (Serax), temazepam (Restoril), triazolam (Halcion) (Last updated: 08/25/2023) ______________________________________________________________________      ______________________________________________________________________    Medication Recommendations and Reminders  Applies to: All patients receiving prescriptions (written and/or electronic).  Medication Rules & Regulations: You are responsible for reading, knowing, and following our "Medication Rules" document. These exist for your safety and that of others. They are not flexible and neither are we. Dismissing or ignoring them is an act of "non-compliance" that may result in complete and irreversible termination of such medication therapy. For safety reasons, "non-compliance" will not be tolerated. As with the [place] fundamental legal principle of "ignorance of the law is no defense", we will accept no excuses for not having read and knowing the content of documents provided to you by our practice.  Pharmacy of record:  Definition: This is the pharmacy where your electronic prescriptions will be sent.  We do not endorse any particular pharmacy. It is up to you and your insurance to decide what pharmacy to use.  We do not restrict you in your choice of pharmacy. However, once we write for your prescriptions, we will NOT be re-sending more prescriptions to  fix restricted supply problems created by your pharmacy, or your insurance.  The pharmacy listed in the electronic medical record should be the one where you want electronic prescriptions to be sent. If you choose to change pharmacy, simply notify our nursing staff. Changes will be made only during your regular appointments and not over the phone.  Recommendations: Keep all of your pain medications in a safe place, under lock and key, even if you live alone. We will NOT replace lost, stolen, or damaged medication. We do not accept "Police Reports" as proof of medications having been stolen. After you fill your prescription, take 1 week's worth of pills and put them away in a safe place. You should keep a separate, properly labeled bottle for this purpose. The remainder should be kept in the original bottle. Use this as your primary supply, until it runs out. Once it's gone, then you know that you have 1 week's worth of medicine, and it is time to come in for a prescription refill. If you do this correctly, it is unlikely that you will ever run out of medicine. To make sure that the above recommendation works, it is very important that you make sure your medication refill appointments are scheduled at least 1 week before you run out of medicine. To do this in an effective manner, make sure that you do not leave the office without scheduling your next medication management appointment. Always ask the nursing staff to show you in your prescription , when your medication will be running out. Then arrange for the receptionist to get you a return appointment, at least 7 days before you run out of medicine. Do not wait until you have 1 or 2 pills left, to come in.  This is very poor planning and does not take into consideration that we may need to cancel appointments due to bad weather, sickness, or emergencies affecting our staff. DO NOT ACCEPT A "Partial Fill": If for any reason your pharmacy does not have enough  pills/tablets to completely fill or refill your prescription, do not allow for a "partial fill". The law allows the pharmacy to complete that prescription within 72 hours, without requiring a new prescription. If they do not fill the rest of your prescription within those 72 hours, you will need a separate prescription to fill the remaining amount, which we will NOT provide. If the reason for the partial fill is your insurance, you will need to talk to the pharmacist about payment alternatives for the remaining tablets, but again, DO NOT ACCEPT A PARTIAL FILL, unless you can trust your pharmacist to obtain the remainder of the pills within 72 hours.  Prescription refills and/or changes in medication(s):  Prescription refills, and/or changes in dose or medication, will be conducted only during scheduled medication management appointments. (Applies to both, written and electronic prescriptions.) No refills on procedure days. No medication will be changed or started on procedure days. No changes, adjustments, and/or refills will be conducted on a procedure day. Doing so will interfere with the diagnostic portion of the procedure. No phone refills. No medications will be "called into the pharmacy". No Fax refills. No weekend refills. No Holliday refills. No after hours refills.  Remember:  Business hours are:  Monday to Thursday 8:00 AM to 4:00 PM Provider's Schedule: Renaldo Caroli, MD - Appointments are:  Medication management: Monday and Wednesday 8:00 AM to 4:00 PM Procedure day: Tuesday and Thursday 7:30 AM to 4:00 PM Cephus Collin, MD - Appointments are:  Medication management: Tuesday and Thursday 8:00 AM to 4:00 PM Procedure day: Monday and Wednesday 7:30 AM to 4:00 PM (Last update: 08/25/2022) ______________________________________________________________________

## 2024-02-27 NOTE — Progress Notes (Unsigned)
 PROVIDER NOTE: Interpretation of information contained herein should be left to medically-trained personnel. Specific patient instructions are provided elsewhere under "Patient Instructions" section of medical record. This document was created in part using AI and STT-dictation technology, any transcriptional errors that may result from this process are unintentional.  Patient: Stephanie [name]  Service: E/M   PCP: Efraim Grange, NP  DOB: Nov 26, 1960  DOS: 02/28/2024  Provider: Candi Chafe, MD  MRN: [id number]  Delivery: Face-to-face  Specialty: Interventional Pain Management  Type: Established Patient  Setting: Ambulatory outpatient facility  Specialty designation: 09  Referring Prov.: Efraim Grange, NP  Location: Outpatient office facility       HPI  [name], a 63 y.o. year old female, is here today because of her Chronic pain syndrome [G89.4]. Ms. Wragge primary complain today is No chief complaint on file.  Pertinent problems: Ms. 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); Musculoskeletal pain; Neurogenic pain; Spasm of paraspinal muscle; Chronic hip pain (Bilateral) (R>L); Osteoarthritis of knee (Bilateral); Osteoarthritis of hip (Bilateral); Osteoarthritis of sacroiliac joint (Bilateral); Chronic sacroiliac joint pain (Bilateral); Chronic pain syndrome; Failed back surgical syndrome; Eczema of hands (Bilateral) (R>L); Abnormal MRI, knee (04/04/2018); 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 Pain Assessment: Severity of   is reported as a  /10. Location:    / . Onset:  . Quality:  . Timing:  . Modifying factor(s):  Aaron Aas Vitals:  vitals were not taken for this visit.  BMI: Estimated body mass index is 28.19 kg/m as calculated from the following:   Height as of 11/29/23: 5\' 7"  (1.702 m).   Weight as of 11/29/23: 180 lb (81.6 kg). Last encounter: 11/29/2023. Last procedure: Visit date not found.  Reason for encounter: medication management. ***  Routine UDS ordered today.   Discussed the use of AI scribe software for clinical note transcription with the patient, who gave verbal consent to proceed.  History of Present Illness         RTCB: 05/29/2024   Pharmacotherapy Assessment  Analgesic: Hydrocodone /acetaminophen  10/325 mg, 0.5 tab PO BID (10 mg/day of hydrocodone ) MME/day: 10 mg/day.   Monitoring: [place] PMP: PDMP reviewed during this encounter.       Pharmacotherapy: No side-effects or adverse reactions reported. Compliance: No problems identified. Effectiveness: Clinically acceptable.  No notes on file  No results found for: "CBDTHCR" No results found for: "D8THCCBX" No results found for: "D9THCCBX"  UDS:  Summary  Date Value Ref Range Status  03/03/2023 Note  Final    Comment:    ==================================================================== [employer] [facility] (MW) ==================================================================== Test                             Result       Flag       Units  Drug Present  and Declared for Prescription Verification   Hydrocodone                     1659         EXPECTED   ng/mg creat   Hydromorphone                    221          EXPECTED   ng/mg creat   Dihydrocodeine                 207          EXPECTED   ng/mg creat   Norhydrocodone                 2319         EXPECTED   ng/mg creat    Sources of hydrocodone  include scheduled prescription medications.    Hydromorphone , dihydrocodeine and norhydrocodone are expected    metabolites of hydrocodone . Hydromorphone  and dihydrocodeine are    also available as scheduled prescription medications.  ==================================================================== Test                      Result    Flag   Units      Ref Range   Creatinine              70               mg/dL      >=44 ==================================================================== Declared Medications:  The flagging and interpretation on this report are based on the  following declared medications.  Unexpected results may arise from  inaccuracies in the declared medications.   **Note: The testing scope of this panel includes these medications:   Hydrocodone  (Norco)   **Note: The testing scope of this panel does not include the  following reported medications:   Acetaminophen  (Norco)  Aspirin   Cholecalciferol  Cyclobenzaprine  (Flexeril )  Dulaglutide (Trulicity)  Escitalopram  (Lexapro )  Gabapentin  (Neurontin )  Ibuprofen (Advil)  Lurasidone (Latuda)  Melatonin  Meloxicam  (Mobic )  Naloxone  (Narcan )  Prednisone  (Deltasone )  Triamcinolone  (Kenalog )  Vitamin B12 ==================================================================== For clinical consultation, please call [phone]. ====================================================================       ROS  Constitutional: Denies any fever or chills Gastrointestinal: No reported hemesis, hematochezia, vomiting, or acute GI distress Musculoskeletal: Denies any acute onset joint swelling, redness, loss of ROM, or weakness Neurological: No reported episodes of acute onset apraxia, aphasia, dysarthria,  agnosia, amnesia, paralysis, loss of coordination, or loss of consciousness  Medication Review  Cholecalciferol, Dulaglutide, HYDROcodone -acetaminophen , Melatonin, amoxicillin, aspirin  EC, cyanocobalamin , cyclobenzaprine , escitalopram , gabapentin , ibuprofen, lurasidone, meloxicam , naloxone , and triamcinolone  ointment  History Review  Allergy: Ms. Wilhite is allergic to phenergan  [promethazine  hcl]. Drug: Ms. Crapps  reports no history of drug use. Alcohol:  reports no history of alcohol use. Tobacco:  reports that she has never smoked. She has never used smokeless tobacco. Social: Ms. Brande  reports that she has never smoked. She has never used smokeless tobacco. She reports that she does not drink alcohol and does not use drugs. Medical:  has a past medical history of Allergy, Anemia, iron deficiency (04/09/2014), Arthritis, Atypical chest pain (04/09/2014), CD (contact dermatitis) (03/01/2014), Chest pain (04/09/2014), Chronic pain syndrome, Degenerative disc disease, lumbar, Depression, Diabetes mellitus without complication (HCC), Lumbar radicular pain (08/22/2015), Lumbar spinal stenosis, Neuromuscular disorder (HCC), Osteoarthritis, Vitamin B12 deficiency, and Vitamin D  deficiency. Surgical: Ms. Sorce  has a past surgical history that includes Lumbar spine surgery (2010);  Appendectomy (2015); Cholecystectomy (2014); Cesarean section (1990); Bunionectomy (Bilateral, 2014); Cesarean section (1991); Knee arthroscopy (Right, 2014); Knee Arthroplasty (Right, 12/07/2022); and Knee arthroscopy (Right, 03/10/2023). Family: family history includes Asthma in her father; Stroke in her mother.  Laboratory Chemistry Profile   Renal Lab Results  Component Value Date   BUN 14 11/24/2022   CREATININE 0.66 11/24/2022   GFRAA >60 09/01/2019   GFRNONAA >60 11/24/2022    Hepatic Lab Results  Component Value Date   AST 16 11/24/2022   ALT 11 11/24/2022   ALBUMIN 4.1 11/24/2022   ALKPHOS 48 11/24/2022     Electrolytes Lab Results  Component Value Date   NA 138 11/24/2022   K 3.5 11/24/2022   CL 109 11/24/2022   CALCIUM 8.7 (L) 11/24/2022   MG 1.9 09/01/2019    Bone Lab Results  Component Value Date   VD25OH 33.18 09/01/2019   25OHVITD1 34 05/07/2016   25OHVITD2 <1.0 05/07/2016   25OHVITD3 34 05/07/2016    Inflammation (CRP: Acute Phase) (ESR: Chronic Phase) Lab Results  Component Value Date   CRP 0.9 03/09/2023   ESRSEDRATE 5 03/09/2023         Note: Above Lab results reviewed.  Recent Imaging Review  DG Knee Right Port CLINICAL DATA:  232140 Total knee replacement status 232140  EXAM: PORTABLE RIGHT KNEE - 1-2 VIEW  COMPARISON:  12/21/2017  FINDINGS: Interval postsurgical changes from right total knee arthroplasty. Arthroplasty components are in their expected alignment. No periprosthetic fracture or evidence of other complication. Expected postoperative changes within the overlying soft tissues.  IMPRESSION: Status post right total knee arthroplasty without evidence of complication.  Electronically Signed   By: Leverne Reading D.O.   On: 12/07/2022 14:59 Note: Reviewed        Physical Exam  General appearance: Well nourished, well developed, and well hydrated. In no apparent acute distress Mental status: Alert, oriented x 3 (person, place, & time)       Respiratory: No evidence of acute respiratory distress Eyes: PERLA Vitals: LMP  (LMP Unknown)  BMI: Estimated body mass index is 28.19 kg/m as calculated from the following:   Height as of 11/29/23: 5\' 7"  (1.702 m).   Weight as of 11/29/23: 180 lb (81.6 kg). Ideal: Patient weight not recorded  Assessment   Diagnosis Status  1. Chronic pain syndrome   2. Chronic low back pain (1ry area of Pain) (Bilateral) (L>R)   3. Chronic lower extremity pain (2ry area of Pain) (Bilateral) (L>R)   4. Chronic knee pain (3ry area of Pain) (Bilateral) (R>L)   5. Cervicalgia   6. Failed back surgical syndrome    7. Chronic upper extremity pain (Bilateral)   8. Chronic hip pain (Bilateral) (R>L)   9. Pharmacologic therapy   10. Chronic use of opiate for therapeutic purpose   11. Encounter for medication management   12. Encounter for chronic pain management    Controlled Controlled Controlled   Updated Problems: No problems updated.  Plan of Care  Problem-specific:  Assessment and Plan            Ms. Derry Slaven has a current medication list which includes the following long-term medication(s): cyclobenzaprine , escitalopram , gabapentin , hydrocodone -acetaminophen , meloxicam , and triamcinolone  ointment.  Pharmacotherapy (Medications Ordered): No orders of the defined types were placed in this encounter.  Orders:  No orders of the defined types were placed in this encounter.  Follow-up plan:   No follow-ups on file.     Interventional Therapies  Risk  Complexity Considerations:   Estimated body mass index is 27.25 kg/m as calculated from the following:   Height as of this encounter: 5\' 7"  (1.702 m).   Weight as of this encounter: 174 lb (78.9 kg). WNL   Planned  Pending:   Diagnostic right caudal ESI #2    Under consideration:   Diagnostic bilateral lumbar facet MBB Diagnostic Caudal ESI Diagnostic Bilateral L4-5 & L5-S1Lumbar TFESI Diagnostic bilateral sacroiliac joint Blk Diagnostic bilateral hip joint injection Diagnostic bilateral genicular NB Possible Racz procedure    Completed:   Palliative Caudal ESI x1 (02/01/2017) (100/100/80/80)   Completed by other providers:   Surgery: Right total knee replacement by Dr. Alveria Johann (12/07/2022)  Therapeutic right knee IA steroid inj. x6 (05/14/2020, 07/22/2020, 08/12/2020, 09/15/2021, 12/29/2021, 04/07/2022) by Clifm Dame, PA-C  Therapeutic left knee IA steroid inj. x4 (04/29/2020, 07/22/2020, 08/12/2020, 09/29/2021) by Clifm Dame, PA-C    Therapeutic  Palliative (PRN) options:   Palliative Caudal ESI #2     Pharmacotherapy  Nonopioids transferred 10/07/2020: Mobic , Neurontin , Flexeril , melatonin, and triamcinolone  ointment.      Recent Visits Date Type Provider Dept  11/29/23 Office Visit Renaldo Caroli, MD [facility]  Showing recent visits within past 90 days and meeting all other requirements Future Appointments Date Type Provider Dept  02/28/24 Appointment Renaldo Caroli, MD [facility]  Showing future appointments within next 90 days and meeting all other requirements  I discussed the assessment and treatment plan with the patient. The patient was provided an opportunity to ask questions and all were answered. The patient agreed with the plan and demonstrated an understanding of the instructions.  Patient advised to call back or seek an in-person evaluation if the symptoms or condition worsens.  Duration of encounter: *** minutes.  Total time on encounter, as per AMA guidelines included both the face-to-face and non-face-to-face time personally spent by the physician and/or other qualified health care professional(s) on the day of the encounter (includes time in activities that require the physician or other qualified health care professional and does not include time in activities normally performed by clinical staff). Physician's time may include the following activities when performed: Preparing to see the patient (e.g., pre-charting review of records, searching for previously ordered imaging, lab work, and nerve conduction tests) Review of prior analgesic pharmacotherapies. Reviewing PMP Interpreting ordered tests (e.g., lab work, imaging, nerve conduction tests) Performing post-procedure evaluations, including interpretation of diagnostic procedures Obtaining and/or reviewing separately obtained history Performing a medically appropriate examination and/or evaluation Counseling and educating the patient/family/caregiver Ordering medications, tests, or  procedures Referring and communicating with other health care professionals (when not separately reported) Documenting clinical information in the electronic or other health record Independently interpreting results (not separately reported) and communicating results to the patient/ family/caregiver Care coordination (not separately reported)  Note by: Candi Chafe, MD (TTS and AI technology used. I apologize for any typographical errors that were not detected and corrected.) Date: 02/28/2024; Time: 10:09 AM

## 2024-02-28 ENCOUNTER — Encounter: Payer: Self-pay | Admitting: Pain Medicine

## 2024-02-28 ENCOUNTER — Ambulatory Visit: Attending: Pain Medicine | Admitting: Pain Medicine

## 2024-02-28 VITALS — BP 112/73 | HR 70 | Temp 97.3°F | Resp 16 | Ht 67.0 in | Wt 172.0 lb

## 2024-02-28 DIAGNOSIS — G8929 Other chronic pain: Secondary | ICD-10-CM | POA: Diagnosis present

## 2024-02-28 DIAGNOSIS — M79605 Pain in left leg: Secondary | ICD-10-CM | POA: Diagnosis present

## 2024-02-28 DIAGNOSIS — M5441 Lumbago with sciatica, right side: Secondary | ICD-10-CM | POA: Diagnosis present

## 2024-02-28 DIAGNOSIS — M79621 Pain in right upper arm: Secondary | ICD-10-CM

## 2024-02-28 DIAGNOSIS — M25561 Pain in right knee: Secondary | ICD-10-CM | POA: Insufficient documentation

## 2024-02-28 DIAGNOSIS — Z79899 Other long term (current) drug therapy: Secondary | ICD-10-CM

## 2024-02-28 DIAGNOSIS — M25551 Pain in right hip: Secondary | ICD-10-CM | POA: Insufficient documentation

## 2024-02-28 DIAGNOSIS — M25552 Pain in left hip: Secondary | ICD-10-CM | POA: Insufficient documentation

## 2024-02-28 DIAGNOSIS — M79622 Pain in left upper arm: Secondary | ICD-10-CM | POA: Insufficient documentation

## 2024-02-28 DIAGNOSIS — M961 Postlaminectomy syndrome, not elsewhere classified: Secondary | ICD-10-CM

## 2024-02-28 DIAGNOSIS — Z79891 Long term (current) use of opiate analgesic: Secondary | ICD-10-CM

## 2024-02-28 DIAGNOSIS — M542 Cervicalgia: Secondary | ICD-10-CM | POA: Diagnosis present

## 2024-02-28 DIAGNOSIS — M79604 Pain in right leg: Secondary | ICD-10-CM | POA: Diagnosis present

## 2024-02-28 DIAGNOSIS — G894 Chronic pain syndrome: Secondary | ICD-10-CM

## 2024-02-28 DIAGNOSIS — M25562 Pain in left knee: Secondary | ICD-10-CM | POA: Insufficient documentation

## 2024-02-28 DIAGNOSIS — M5442 Lumbago with sciatica, left side: Secondary | ICD-10-CM | POA: Diagnosis not present

## 2024-02-28 MED ORDER — HYDROCODONE-ACETAMINOPHEN 10-325 MG PO TABS: 0.5000 | ORAL_TABLET | Freq: Two times a day (BID) | ORAL | 0 refills | Status: DC | PRN

## 2024-02-28 NOTE — Progress Notes (Signed)
 Nursing Pain Medication Assessment:  Safety precautions to be maintained throughout the outpatient stay will include: orient to surroundings, keep bed in low position, maintain call bell within reach at all times, provide assistance with transfer out of bed and ambulation.   Medication Inspection Compliance: Pill count conducted under aseptic conditions, in front of the patient. Neither the pills nor the bottle was removed from the patient's sight at any time. Once count was completed pills were immediately returned to the patient in their original bottle.  Medication: Hydrocodone /APAP Pill/Patch Count:  2.5 of 30 pills remain Pill/Patch Appearance: Markings consistent with prescribed medication Bottle Appearance: Standard pharmacy container. Clearly labeled. Filled Date: 04 / 01 / 2025 Last Medication intake:  Today

## 2024-02-28 NOTE — Progress Notes (Signed)
 PROVIDER NOTE: Interpretation of information contained herein should be left to medically-trained personnel. Specific patient instructions are provided elsewhere under "Patient Instructions" section of medical record. This document was created in part using AI and STT-dictation technology, any transcriptional errors that may result from this process are unintentional.  Patient: Stephanie [name]  Service: E/M   PCP: Efraim Grange, NP  DOB: Jun 26, 1961  DOS: 02/28/2024  Provider: Cherylin Corrigan, NP  MRN: [id number]  Delivery: Face-to-face  Specialty: Interventional Pain Management  Type: Established Patient  Setting: Ambulatory outpatient facility  Specialty designation: 09  Referring Prov.: Efraim Grange, NP  Location: Outpatient office facility       HPI  Stephanie [name], a 63 y.o. year old female, is here today because of her Chronic pain syndrome [G89.4]. Stephanie [name] primary complain today is Back Pain   Pain Assessment: Severity of Chronic pain is reported as a 5 /10. Location: Back Lower, Mid/Can radaite across lower back and top hips down legs bilateral to feet bilateral. Onset: More than a month ago. Quality: Tingling, Numbness, Sharp, Throbbing, Constant. Timing: Constant. Modifying factor(s): Pain medication and heat. Vitals:  height is 5\' 7"  (1.702 m) and weight is 172 lb (78 kg). Her temporal temperature is 97.3 F (36.3 C) (abnormal). Her blood pressure is 112/73 and her pulse is 70. Her respiration is 16 and oxygen saturation is 100%.  BMI: Estimated body mass index is 26.94 kg/m as calculated from the following:   Height as of this encounter: 5\' 7"  (1.702 m).   Weight as of this encounter: 172 lb (78 kg). Last encounter: 11/29/2023.  Reason for encounter: medication management.  The patient indicates doing well with the current medication regimen.  No adverse reaction or side effects reported to the medication.  Stephanie [name], complains of low back pain bilateral with  with the left side being more affected than the right (L>R). Pharmacotherapy Assessment  Analgesic: Hydrocodone -acetaminophen  (Norco) 10-325 1/2 tab every 12 hours as needed for severe pain. MME= 10  Monitoring: [place] PMP: PDMP reviewed during this encounter.       Pharmacotherapy: No side-effects or adverse reactions reported. Compliance: No problems identified. Effectiveness: Clinically acceptable.  Huston Maiers, RN  02/28/2024  8:52 AM  Sign when Signing Visit Nursing Pain Medication Assessment:  Safety precautions to be maintained throughout the outpatient stay will include: orient to surroundings, keep bed in low position, maintain call bell within reach at all times, provide assistance with transfer out of bed and ambulation.   Medication Inspection Compliance: Pill count conducted under aseptic conditions, in front of the patient. Neither the pills nor the bottle was removed from the patient's sight at any time. Once count was completed pills were immediately returned to the patient in their original bottle.  Medication: Hydrocodone /APAP Pill/Patch Count:  2.5 of 30 pills remain Pill/Patch Appearance: Markings consistent with prescribed medication Bottle Appearance: Standard pharmacy container. Clearly labeled. Filled Date: 04 / 01 / 2025 Last Medication intake:  Today    No results found for: "CBDTHCR" No results found for: "D8THCCBX" No results found for: "D9THCCBX"  UDS:  Summary  Date Value Ref Range Status  03/03/2023 Note  Final    Comment:    ==================================================================== [employer] Select 13 (MW) ==================================================================== Test                             Result       Flag  Units  Drug Present and Declared for Prescription Verification   Hydrocodone                     1659         EXPECTED   ng/mg creat   Hydromorphone                   221          EXPECTED   ng/mg creat    Dihydrocodeine                 207          EXPECTED   ng/mg creat   Norhydrocodone                 2319         EXPECTED   ng/mg creat    Sources of hydrocodone  include scheduled prescription medications.    Hydromorphone , dihydrocodeine and norhydrocodone are expected    metabolites of hydrocodone . Hydromorphone  and dihydrocodeine are    also available as scheduled prescription medications.  ==================================================================== Test                      Result    Flag   Units      Ref Range   Creatinine              70               mg/dL      >=16 ==================================================================== Declared Medications:  The flagging and interpretation on this report are based on the  following declared medications.  Unexpected results may arise from  inaccuracies in the declared medications.   **Note: The testing scope of this panel includes these medications:   Hydrocodone  (Norco)   **Note: The testing scope of this panel does not include the  following reported medications:   Acetaminophen  (Norco)  Aspirin   Cholecalciferol  Cyclobenzaprine  (Flexeril )  Dulaglutide (Trulicity)  Escitalopram  (Lexapro )  Gabapentin  (Neurontin )  Ibuprofen (Advil)  Lurasidone (Latuda)  Melatonin  Meloxicam  (Mobic )  Naloxone  (Narcan )  Prednisone  (Deltasone )  Triamcinolone  (Kenalog )  Vitamin B12 ==================================================================== For clinical consultation, please call [phone]. ====================================================================       ROS  Constitutional: Denies any fever or chills Gastrointestinal: No reported hemesis, hematochezia, vomiting, or acute GI distress Musculoskeletal: low back pain bilateral (L>R) Neurological: No reported episodes of acute onset apraxia, aphasia, dysarthria, agnosia, amnesia, paralysis, loss of coordination, or loss of consciousness  Medication Review   Cholecalciferol, Dulaglutide, HYDROcodone -acetaminophen , LORazepam , Melatonin, amoxicillin, aspirin  EC, cyanocobalamin , cyclobenzaprine , escitalopram , gabapentin , ibuprofen, lurasidone, meloxicam , naloxone , tirzepatide, and triamcinolone  ointment  History Review  Allergy: Stephanie [name] is allergic to phenergan  [promethazine  hcl]. Drug: Stephanie [name]  reports no history of drug use. Alcohol:  reports no history of alcohol use. Tobacco:  reports that she has never smoked. She has never used smokeless tobacco. Social: Stephanie [name]  reports that she has never smoked. She has never used smokeless tobacco. She reports that she does not drink alcohol and does not use drugs. Medical:  has a past medical history of Allergy, Anemia, iron deficiency (04/09/2014), Arthritis, Atypical chest pain (04/09/2014), CD (contact dermatitis) (03/01/2014), Chest pain (04/09/2014), Chronic pain syndrome, Degenerative disc disease, lumbar, Depression, Diabetes mellitus without complication (HCC), Lumbar radicular pain (08/22/2015), Lumbar spinal stenosis, Neuromuscular disorder (HCC), Osteoarthritis, Vitamin B12 deficiency, and Vitamin D  deficiency. Surgical: Stephanie [name]  has a past surgical history that includes Lumbar spine surgery (2010); Appendectomy (  2015); Cholecystectomy (2014); Cesarean section (1990); Bunionectomy (Bilateral, 2014); Cesarean section (1991); Knee arthroscopy (Right, 2014); Knee Arthroplasty (Right, 12/07/2022); and Knee arthroscopy (Right, 03/10/2023). Family: family history includes Asthma in her father; Stroke in her mother.  Laboratory Chemistry Profile   Renal Lab Results  Component Value Date   BUN 14 11/24/2022   CREATININE 0.66 11/24/2022   GFRAA >60 09/01/2019   GFRNONAA >60 11/24/2022    Hepatic Lab Results  Component Value Date   AST 16 11/24/2022   ALT 11 11/24/2022   ALBUMIN 4.1 11/24/2022   ALKPHOS 48 11/24/2022    Electrolytes Lab Results  Component Value Date   NA 138 11/24/2022    K 3.5 11/24/2022   CL 109 11/24/2022   CALCIUM 8.7 (L) 11/24/2022   MG 1.9 09/01/2019    Bone Lab Results  Component Value Date   VD25OH 33.18 09/01/2019   25OHVITD1 34 05/07/2016   25OHVITD2 <1.0 05/07/2016   25OHVITD3 34 05/07/2016    Inflammation (CRP: Acute Phase) (ESR: Chronic Phase) Lab Results  Component Value Date   CRP 0.9 03/09/2023   ESRSEDRATE 5 03/09/2023         Note: Above Lab results reviewed.  Recent Imaging Review  DG Knee Right Port CLINICAL DATA:  232140 Total knee replacement status 232140  EXAM: PORTABLE RIGHT KNEE - 1-2 VIEW  COMPARISON:  12/21/2017  FINDINGS: Interval postsurgical changes from right total knee arthroplasty. Arthroplasty components are in their expected alignment. No periprosthetic fracture or evidence of other complication. Expected postoperative changes within the overlying soft tissues.  IMPRESSION: Status post right total knee arthroplasty without evidence of complication.  Electronically Signed   By: Leverne Reading D.O.   On: 12/07/2022 14:59 Note: Reviewed        Physical Exam  General appearance: Well nourished, well developed, and well hydrated. In no apparent acute distress Mental status: Alert, oriented x 3 (person, place, & time)       Respiratory: No evidence of acute respiratory distress Eyes: PERLA Vitals: BP 112/73 (Patient Position: Sitting, Cuff Size: Normal)   Pulse 70   Temp (!) 97.3 F (36.3 C) (Temporal)   Resp 16   Ht 5\' 7"  (1.702 m)   Wt 172 lb (78 kg)   LMP  (LMP Unknown)   SpO2 100%   BMI 26.94 kg/m  BMI: Estimated body mass index is 26.94 kg/m as calculated from the following:   Height as of this encounter: 5\' 7"  (1.702 m).   Weight as of this encounter: 172 lb (78 kg). Ideal: Ideal body weight: 61.6 kg (135 lb 12.9 oz) Adjusted ideal body weight: 68.2 kg (150 lb 4.5 oz)  Assessment   Diagnosis Status  1. Chronic pain syndrome   2. Chronic low back pain (1ry area of Pain)  (Bilateral) (L>R)   3. Chronic lower extremity pain (2ry area of Pain) (Bilateral) (L>R)   4. Chronic knee pain (3ry area of Pain) (Bilateral) (R>L)   5. Cervicalgia   6. Failed back surgical syndrome   7. Chronic upper extremity pain (Bilateral)   8. Chronic hip pain (Bilateral) (R>L)   9. Pharmacologic therapy   10. Chronic use of opiate for therapeutic purpose   11. Encounter for medication management   12. Encounter for chronic pain management    Controlled Controlled Controlled   Plan of Care  Assessment and Plan We will continue on current medication regimen.  Routine UDS ordered today.  The patient was advised that if an  epidural injection or procedure is needed in the future, they should schedule an appointment with Dr. Barth Borne.   Interventional Therapies  Risk  Complexity Considerations:   Estimated body mass index is 27.25 kg/m as calculated from the following:   Height as of this encounter: 5\' 7"  (1.702 m).   Weight as of this encounter: 174 lb (78.9 kg). WNL    Planned  Pending:   Diagnostic right caudal ESI #2     Under consideration:   Diagnostic bilateral lumbar facet MBB Diagnostic Caudal ESI Diagnostic Bilateral L4-5 & L5-S1Lumbar TFESI Diagnostic bilateral sacroiliac joint Blk Diagnostic bilateral hip joint injection Diagnostic bilateral genicular NB Possible Racz procedure     Completed:   Palliative Caudal ESI x1 (02/01/2017) (100/100/80/80)    Completed by other providers:   Surgery: Right total knee replacement by Dr. Alveria Johann (12/07/2022)  Therapeutic right knee IA steroid inj. x6 (05/14/2020, 07/22/2020, 08/12/2020, 09/15/2021, 12/29/2021, 04/07/2022) by Clifm Dame, PA-C  Therapeutic left knee IA steroid inj. x4 (04/29/2020, 07/22/2020, 08/12/2020, 09/29/2021) by Clifm Dame, PA-C     Therapeutic  Palliative (PRN) options:   Palliative Caudal ESI #2     Pharmacotherapy  Nonopioids transferred 10/07/2020: Mobic , Neurontin , Flexeril , melatonin,  and triamcinolone  ointment.   Pharmacotherapy (Medications Ordered): Meds ordered this encounter  Medications   HYDROcodone -acetaminophen  (NORCO) 10-325 MG tablet    Sig: Take 0.5 tablets by mouth every 12 (twelve) hours as needed for severe pain (pain score 7-10). Must last 30 days    Dispense:  30 tablet    Refill:  0    DO NOT: delete (not duplicate); no partial-fill (will deny script to complete), no refill request (F/U required). DISPENSE: 1 day early if closed on fill date. WARN: No CNS-depressants within 8 hrs of med.   HYDROcodone -acetaminophen  (NORCO) 10-325 MG tablet    Sig: Take 0.5 tablets by mouth every 12 (twelve) hours as needed for severe pain (pain score 7-10). Must last 30 days    Dispense:  30 tablet    Refill:  0    DO NOT: delete (not duplicate); no partial-fill (will deny script to complete), no refill request (F/U required). DISPENSE: 1 day early if closed on fill date. WARN: No CNS-depressants within 8 hrs of med.   HYDROcodone -acetaminophen  (NORCO) 10-325 MG tablet    Sig: Take 0.5 tablets by mouth every 12 (twelve) hours as needed for severe pain (pain score 7-10). Must last 30 days    Dispense:  30 tablet    Refill:  0    DO NOT: delete (not duplicate); no partial-fill (will deny script to complete), no refill request (F/U required). DISPENSE: 1 day early if closed on fill date. WARN: No CNS-depressants within 8 hrs of med.   Orders:  Orders Placed This Encounter  Procedures   [employer] Select 13 (MW), Urine    Volume: 30 ml(s). Minimum 3 ml of urine is needed. Document temperature of fresh sample. Indications: Long term (current) use of opiate analgesic (Z61.096)    Release to patient:   Immediate   Nursing Instructions:    1). STAT: UDS required today. 2). Make sure to document all opioids and benzodiazepines taken, including time of last intake. 3). If order is entered on a procedure day, make sure sample is obtained before any medications are  administered.   Schedule appointment    From now on all medication management appointments and refills should be scheduled with our Mid-Level Practitioner Marthe Slain, NP).    Scheduling  Instructions:     Please switch medication management visits to Mid-Level Practitioner's schedule Marthe Slain, NP).   Follow-up plan:   Return in about 3 months (around 05/29/2024) for (F2F), (MM), Marthe Slain NP.        Recent Visits No visits were found meeting these conditions. Showing recent visits within past 90 days and meeting all other requirements Today's Visits Date Type Provider Dept  02/28/24 Office Visit Renaldo Caroli, MD [facility]  Showing today's visits and meeting all other requirements Future Appointments Date Type Provider Dept  05/24/24 Appointment Daksha Koone K, NP [facility]  Showing future appointments within next 90 days and meeting all other requirements  I discussed the assessment and treatment plan with the patient. The patient was provided an opportunity to ask questions and all were answered. The patient agreed with the plan and demonstrated an understanding of the instructions.  Patient advised to call back or seek an in-person evaluation if the symptoms or condition worsens.  Duration of encounter: 30 minutes.  Total time on encounter, as per AMA guidelines included both the face-to-face and non-face-to-face time personally spent by the physician and/or other qualified health care professional(s) on the day of the encounter (includes time in activities that require the physician or other qualified health care professional and does not include time in activities normally performed by clinical staff). Physician's time may include the following activities when performed: Preparing to see the patient (e.g., pre-charting review of records, searching for previously ordered imaging, lab work, and nerve conduction tests) Review of prior analgesic  pharmacotherapies. Reviewing PMP Interpreting ordered tests (e.g., lab work, imaging, nerve conduction tests) Performing post-procedure evaluations, including interpretation of diagnostic procedures Obtaining and/or reviewing separately obtained history Performing a medically appropriate examination and/or evaluation Counseling and educating the patient/family/caregiver Ordering medications, tests, or procedures Referring and communicating with other health care professionals (when not separately reported) Documenting clinical information in the electronic or other health record Independently interpreting results (not separately reported) and communicating results to the patient/ family/caregiver Care coordination (not separately reported)  Note by: Jamee Keach K Jelina Paulsen, NP (TTS and AI technology used. I apologize for any typographical errors that were not detected and corrected.) Date: 02/28/2024; Time: 12:34 PM

## 2024-03-02 LAB — TOXASSURE SELECT 13 (MW), URINE

## 2024-03-30 ENCOUNTER — Ambulatory Visit: Payer: Self-pay

## 2024-03-30 DIAGNOSIS — K621 Rectal polyp: Secondary | ICD-10-CM | POA: Diagnosis not present

## 2024-03-30 DIAGNOSIS — Z1211 Encounter for screening for malignant neoplasm of colon: Secondary | ICD-10-CM | POA: Diagnosis not present

## 2024-03-30 DIAGNOSIS — K5730 Diverticulosis of large intestine without perforation or abscess without bleeding: Secondary | ICD-10-CM | POA: Diagnosis not present

## 2024-03-30 DIAGNOSIS — K640 First degree hemorrhoids: Secondary | ICD-10-CM

## 2024-03-30 DIAGNOSIS — Z83719 Family history of colon polyps, unspecified: Secondary | ICD-10-CM | POA: Diagnosis not present

## 2024-05-24 ENCOUNTER — Encounter: Payer: Self-pay | Admitting: Nurse Practitioner

## 2024-05-24 ENCOUNTER — Ambulatory Visit: Payer: Self-pay | Attending: Nurse Practitioner | Admitting: Nurse Practitioner

## 2024-05-24 DIAGNOSIS — M25562 Pain in left knee: Secondary | ICD-10-CM | POA: Diagnosis not present

## 2024-05-24 DIAGNOSIS — M7918 Myalgia, other site: Secondary | ICD-10-CM | POA: Diagnosis present

## 2024-05-24 DIAGNOSIS — M62830 Muscle spasm of back: Secondary | ICD-10-CM | POA: Diagnosis present

## 2024-05-24 DIAGNOSIS — M792 Neuralgia and neuritis, unspecified: Secondary | ICD-10-CM | POA: Insufficient documentation

## 2024-05-24 DIAGNOSIS — M542 Cervicalgia: Secondary | ICD-10-CM | POA: Diagnosis present

## 2024-05-24 DIAGNOSIS — M79605 Pain in left leg: Secondary | ICD-10-CM

## 2024-05-24 DIAGNOSIS — M79622 Pain in left upper arm: Secondary | ICD-10-CM

## 2024-05-24 DIAGNOSIS — G8929 Other chronic pain: Secondary | ICD-10-CM | POA: Insufficient documentation

## 2024-05-24 DIAGNOSIS — Z79899 Other long term (current) drug therapy: Secondary | ICD-10-CM | POA: Insufficient documentation

## 2024-05-24 DIAGNOSIS — M25552 Pain in left hip: Secondary | ICD-10-CM | POA: Diagnosis present

## 2024-05-24 DIAGNOSIS — M79621 Pain in right upper arm: Secondary | ICD-10-CM

## 2024-05-24 DIAGNOSIS — M25551 Pain in right hip: Secondary | ICD-10-CM | POA: Diagnosis present

## 2024-05-24 DIAGNOSIS — G894 Chronic pain syndrome: Secondary | ICD-10-CM

## 2024-05-24 DIAGNOSIS — M961 Postlaminectomy syndrome, not elsewhere classified: Secondary | ICD-10-CM

## 2024-05-24 DIAGNOSIS — M79604 Pain in right leg: Secondary | ICD-10-CM | POA: Diagnosis not present

## 2024-05-24 DIAGNOSIS — M5442 Lumbago with sciatica, left side: Secondary | ICD-10-CM | POA: Diagnosis present

## 2024-05-24 DIAGNOSIS — Z79891 Long term (current) use of opiate analgesic: Secondary | ICD-10-CM | POA: Diagnosis present

## 2024-05-24 DIAGNOSIS — M5441 Lumbago with sciatica, right side: Secondary | ICD-10-CM | POA: Insufficient documentation

## 2024-05-24 DIAGNOSIS — M25561 Pain in right knee: Secondary | ICD-10-CM | POA: Diagnosis not present

## 2024-05-24 MED ORDER — HYDROCODONE-ACETAMINOPHEN 10-325 MG PO TABS: 0.5000 | ORAL_TABLET | Freq: Two times a day (BID) | ORAL | 0 refills | Status: DC | PRN

## 2024-05-24 MED ORDER — CYCLOBENZAPRINE HCL 10 MG PO TABS: 10.0000 mg | ORAL_TABLET | Freq: Every day | ORAL | 2 refills | Status: AC

## 2024-05-24 MED ORDER — GABAPENTIN 800 MG PO TABS: 800.0000 mg | ORAL_TABLET | Freq: Three times a day (TID) | ORAL | 2 refills | Status: AC

## 2024-05-24 NOTE — Progress Notes (Signed)
 PROVIDER NOTE: Interpretation of information contained herein should be left to medically-trained personnel. Specific patient instructions are provided elsewhere under Patient Instructions section of medical record. This document was created in part using AI and STT-dictation technology, any transcriptional errors that may result from this process are unintentional.  Patient: Stephanie [name]  Service: E/M   PCP: Steva Clotilda DEL, NP  DOB: [birth year]  DOS: 05/24/2024  Provider: Emmy MARLA Blanch, NP  MRN: [id number]  Delivery: Face-to-face  Specialty: Interventional Pain Management  Type: Established Patient  Setting: Ambulatory outpatient facility  Specialty designation: 09  Referring Prov.: Steva Clotilda DEL, NP  Location: Outpatient office facility       History of present illness (HPI) [name], a 63 y.o. year old female, is here today because of her No primary diagnosis found.. [name] primary complain today is Back Pain (Lumbar midline )  Pertinent problems: [name] has Lumbar facet syndrome (Bilateral) (L>R); Chronic low back pain (1ry area of pain) (Bilateral) (L>R); Chronic meniscal tear of knee (Right); Lumbar spondylosis; Epidural fibrosis; Spondylolisthesis of lumbar region; Chronic lower extremity pain (2ry area of pain) (Bilateral) (L>R); Chronic lumbar radicular pain (Right) (S1); Musculoskeletal pain; Neurogenic pain; Spasm of paraspinal muscle; Chronic hip pain (Bilateral) (R>L); Osteoarthritis of knee (Bilateral); and Chronic pain syndrome on their pertinent problem list.   Pain Assessment: Severity of Chronic pain is reported as a 5 /10. Location: Back Lower, Mid/up the back for the last couple of days but typically radiates down the legs. Onset: More than a month ago. Quality: Constant, Discomfort, Sharp, Other (Comment) (piercing especially with movement). Timing: Constant. Modifying factor(s): medications, only getting 1/2 tab q12 hours and that does not last  very long, takes a lot of tylenol  in between. Vitals:  height is 5' 7 (1.702 m) and weight is 170 lb (77.1 kg). Her temporal temperature is 98.1 F (36.7 C). Her blood pressure is 114/72 and her pulse is 75. Her respiration is 16 and oxygen saturation is 98%.  BMI: Estimated body mass index is 26.63 kg/m as calculated from the following:   Height as of this encounter: 5' 7 (1.702 m).   Weight as of this encounter: 170 lb (77.1 kg).  Last encounter: 02/28/2024 Last procedure: Visit date not found.  Reason for encounter: medication management.   The patient indicates doing well with the current medication regimen.  No adverse reaction or side effects reported to the medication.  [name], complains of low back pain radiate to mid upper back, which began last week.   Pharmacotherapy Assessment   Hydrocodone -acetaminophen  (Norco) 10-325 1/2 tab every 12 hours as needed for severe pain. MME= 10  Flexeril  10 Mg daily for muscle spasm Gabapentin  (Neurontin ) (800 mg total) tablet by mouth 3 times daily for neuropathic pain Monitoring: [place] PMP: PDMP reviewed during this encounter.       Pharmacotherapy: No side-effects or adverse reactions reported. Compliance: No problems identified. Effectiveness: Clinically acceptable.  [name] MATSU, RN  05/24/2024  8:26 AM  Sign when Signing Visit Nursing Pain Medication Assessment:  Safety precautions to be maintained throughout the outpatient stay will include: orient to surroundings, keep bed in low position, maintain call bell within reach at all times, provide assistance with transfer out of bed and ambulation.  Medication Inspection Compliance: Pill count conducted under aseptic conditions, in front of the patient. Neither the pills nor the bottle was removed from the patient's sight at any time. Once count was completed pills  were immediately returned to the patient in their original bottle.  Medication: Hydrocodone /APAP Pill/Patch Count: 6.5  of 30 pills/patches remain Pill/Patch Appearance: Markings consistent with prescribed medication Bottle Appearance: Standard pharmacy container. Clearly labeled. Filled Date: 06 / 30 / 2025 Last Medication intake:  Today  UDS:  Summary  Date Value Ref Range Status  02/28/2024 FINAL  Final    Comment:    ==================================================================== [employer] [facility] (MW) ==================================================================== Test                             Result       Flag       Units  Drug Present and Declared for Prescription Verification   Lorazepam                       415          EXPECTED   ng/mg creat    Source of lorazepam  is a scheduled prescription medication.    Hydrocodone                     1807         EXPECTED   ng/mg creat   Hydromorphone                   256          EXPECTED   ng/mg creat   Dihydrocodeine                 167          EXPECTED   ng/mg creat   Norhydrocodone                 1722         EXPECTED   ng/mg creat    Sources of hydrocodone  include scheduled prescription medications.    Hydromorphone , dihydrocodeine and norhydrocodone are expected    metabolites of hydrocodone . Hydromorphone  and dihydrocodeine are    also available as scheduled prescription medications.  ==================================================================== Test                      Result    Flag   Units      Ref Range   Creatinine              241              mg/dL      >=79 ==================================================================== Declared Medications:  The flagging and interpretation on this report are based on the  following declared medications.  Unexpected results may arise from  inaccuracies in the declared medications.   **Note: The testing scope of this panel includes these medications:   Hydrocodone  (Norco)  Lorazepam  (Ativan )   **Note: The testing scope of this panel does not include the  following  reported medications:   Acetaminophen  (Norco)  Amoxicillin (Amoxil)  Aspirin   Cholecalciferol  Cyclobenzaprine  (Flexeril )  Dulaglutide (Trulicity)  Escitalopram  (Lexapro )  Gabapentin  (Neurontin )  Ibuprofen (Advil)  Lurasidone (Latuda)  Melatonin  Meloxicam  (Mobic )  Naloxone  (Narcan )  Tirzepatide (Mounjaro)  Topical  Vitamin B12 ==================================================================== For clinical consultation, please call [phone]. ====================================================================     No results found for: CBDTHCR No results found for: D8THCCBX No results found for: D9THCCBX  ROS  Constitutional: Denies any fever or chills Gastrointestinal: No reported hemesis, hematochezia, vomiting, or acute GI distress Musculoskeletal: Lower back pain radiating to mid upper back Neurological:  No reported episodes of acute onset apraxia, aphasia, dysarthria, agnosia, amnesia, paralysis, loss of coordination, or loss of consciousness  Medication Review  Cholecalciferol, Dulaglutide, HYDROcodone -acetaminophen , LORazepam , Melatonin, amoxicillin, aspirin  EC, cyanocobalamin , cyclobenzaprine , escitalopram , gabapentin , ibuprofen, lurasidone, meloxicam , naloxone , tirzepatide, and triamcinolone  ointment  History Review  Allergy: Ms. Dorlene is allergic to phenergan  [promethazine  hcl]. Drug: Ms. Dorlene  reports no history of drug use. Alcohol:  reports no history of alcohol use. Tobacco:  reports that she has never smoked. She has never used smokeless tobacco. Social: Ms. Dorlene  reports that she has never smoked. She has never used smokeless tobacco. She reports that she does not drink alcohol and does not use drugs. Medical:  has a past medical history of Allergy, Anemia, iron deficiency (04/09/2014), Arthritis, Atypical chest pain (04/09/2014), CD (contact dermatitis) (03/01/2014), Chest pain (04/09/2014), Chronic pain syndrome, Degenerative disc  disease, lumbar, Depression, Diabetes mellitus without complication (HCC), Lumbar radicular pain (08/22/2015), Lumbar spinal stenosis, Neuromuscular disorder (HCC), Osteoarthritis, Vitamin B12 deficiency, and Vitamin D  deficiency. Surgical: Ms. Dorlene  has a past surgical history that includes Lumbar spine surgery (2010); Appendectomy (2015); Cholecystectomy (2014); Cesarean section (1990); Bunionectomy (Bilateral, 2014); Cesarean section (1991); Knee arthroscopy (Right, 2014); Knee Arthroplasty (Right, 12/07/2022); and Knee arthroscopy (Right, 03/10/2023). Family: family history includes Asthma in her father; Stroke in her mother.  Laboratory Chemistry Profile   Renal Lab Results  Component Value Date   BUN 14 11/24/2022   CREATININE 0.66 11/24/2022   GFRAA >60 09/01/2019   GFRNONAA >60 11/24/2022    Hepatic Lab Results  Component Value Date   AST 16 11/24/2022   ALT 11 11/24/2022   ALBUMIN 4.1 11/24/2022   ALKPHOS 48 11/24/2022    Electrolytes Lab Results  Component Value Date   NA 138 11/24/2022   K 3.5 11/24/2022   CL 109 11/24/2022   CALCIUM 8.7 (L) 11/24/2022   MG 1.9 09/01/2019    Bone Lab Results  Component Value Date   VD25OH 33.18 09/01/2019   25OHVITD1 34 05/07/2016   25OHVITD2 <1.0 05/07/2016   25OHVITD3 34 05/07/2016    Inflammation (CRP: Acute Phase) (ESR: Chronic Phase) Lab Results  Component Value Date   CRP 0.9 03/09/2023   ESRSEDRATE 5 03/09/2023         Note: Above Lab results reviewed.  Recent Imaging Review  DG Knee Right Port CLINICAL DATA:  232140 Total knee replacement status 232140  EXAM: PORTABLE RIGHT KNEE - 1-2 VIEW  COMPARISON:  12/21/2017  FINDINGS: Interval postsurgical changes from right total knee arthroplasty. Arthroplasty components are in their expected alignment. No periprosthetic fracture or evidence of other complication. Expected postoperative changes within the overlying soft tissues.  IMPRESSION: Status post right  total knee arthroplasty without evidence of complication.  Electronically Signed   By: Mabel Converse D.O.   On: 12/07/2022 14:59 Note: Reviewed        Physical Exam  Vitals: BP 114/72 (BP Location: Right Arm, Patient Position: Sitting, Cuff Size: Normal)   Pulse 75   Temp 98.1 F (36.7 C) (Temporal)   Resp 16   Ht 5' 7 (1.702 m)   Wt 170 lb (77.1 kg)   LMP  (LMP Unknown)   SpO2 98%   BMI 26.63 kg/m  BMI: Estimated body mass index is 26.63 kg/m as calculated from the following:   Height as of this encounter: 5' 7 (1.702 m).   Weight as of this encounter: 170 lb (77.1 kg). Ideal: Ideal body weight: 61.6 kg (135 lb  12.9 oz) Adjusted ideal body weight: 67.8 kg (149 lb 7.7 oz) General appearance: Well nourished, well developed, and well hydrated. In no apparent acute distress Mental status: Alert, oriented x 3 (person, place, & time)       Respiratory: No evidence of acute respiratory distress Eyes: PERLA   Assessment   Diagnosis Status  1. Chronic low back pain (1ry area of Pain) (Bilateral) (L>R)   2. Chronic lower extremity pain (2ry area of Pain) (Bilateral) (L>R)   3. Chronic knee pain (3ry area of Pain) (Bilateral) (R>L)   4. Cervicalgia   5. Failed back surgical syndrome   6. Chronic upper extremity pain (Bilateral)   7. Chronic hip pain (Bilateral) (R>L)   8. Chronic pain syndrome   9. Pharmacologic therapy   10. Chronic use of opiate for therapeutic purpose   11. Encounter for medication management   12. Encounter for chronic pain management   13. Neurogenic pain   14. Musculoskeletal pain   15. Spasm of paraspinal muscle    Controlled Controlled Controlled   Updated Problems: No problems updated.  Plan of Care  Problem-specific:  Assessment and Plan We will continue on current medication regimen.  Prescribing drug monitoring (PDMP) reviewed; findings consistent with the use of prescribed medication and no evidence of narcotic misuse or abuse.   Urine drug screening (UDS) up-to-date.  Schedule follow-up in 90 days for medication management.  I advised the patient that if the pain persistent and is not relieved with the current medication regimen and muscle relaxant, she should return to the office for further evaluation.  Ms. Annisa Mazzarella has a current medication list which includes the following long-term medication(s): escitalopram , meloxicam , triamcinolone  ointment, cyclobenzaprine , gabapentin , [START ON 05/31/2024] hydrocodone -acetaminophen , [START ON 06/30/2024] hydrocodone -acetaminophen , and [START ON 07/30/2024] hydrocodone -acetaminophen .  Pharmacotherapy (Medications Ordered): Meds ordered this encounter  Medications   HYDROcodone -acetaminophen  (NORCO) 10-325 MG tablet    Sig: Take 0.5 tablets by mouth every 12 (twelve) hours as needed for severe pain (pain score 7-10). Must last 30 days    Dispense:  30 tablet    Refill:  0    DO NOT: delete (not duplicate); no partial-fill (will deny script to complete), no refill request (F/U required). DISPENSE: 1 day early if closed on fill date. WARN: No CNS-depressants within 8 hrs of med.   HYDROcodone -acetaminophen  (NORCO) 10-325 MG tablet    Sig: Take 0.5 tablets by mouth every 12 (twelve) hours as needed for severe pain (pain score 7-10). Must last 30 days    Dispense:  30 tablet    Refill:  0    DO NOT: delete (not duplicate); no partial-fill (will deny script to complete), no refill request (F/U required). DISPENSE: 1 day early if closed on fill date. WARN: No CNS-depressants within 8 hrs of med.   HYDROcodone -acetaminophen  (NORCO) 10-325 MG tablet    Sig: Take 0.5 tablets by mouth every 12 (twelve) hours as needed for severe pain (pain score 7-10). Must last 30 days    Dispense:  30 tablet    Refill:  0    DO NOT: delete (not duplicate); no partial-fill (will deny script to complete), no refill request (F/U required). DISPENSE: 1 day early if closed on fill date. WARN: No  CNS-depressants within 8 hrs of med.   gabapentin  (NEURONTIN ) 800 MG tablet    Sig: Take 1 tablet (800 mg total) by mouth 3 (three) times daily.    Dispense:  90 tablet    Refill:  2    Fill one day early if pharmacy is closed on scheduled refill date. May substitute for generic if available.   cyclobenzaprine  (FLEXERIL ) 10 MG tablet    Sig: Take 1 tablet (10 mg total) by mouth daily.    Dispense:  30 tablet    Refill:  2    Fill one day early if pharmacy is closed on scheduled refill date. May substitute for generic if available.   Orders:  No orders of the defined types were placed in this encounter.       Return in about 3 months (around 08/24/2024) for (F2F), (MM), Emmy Blanch NP.    Recent Visits Date Type Provider Dept  02/28/24 Office Visit Tanya Glisson, MD [facility]  Showing recent visits within past 90 days and meeting all other requirements Today's Visits Date Type Provider Dept  05/24/24 Office Visit Jovani Colquhoun K, NP [facility]  Showing today's visits and meeting all other requirements Future Appointments Date Type Provider Dept  08/21/24 Appointment Shafter Jupin K, NP [facility]  Showing future appointments within next 90 days and meeting all other requirements  I discussed the assessment and treatment plan with the patient. The patient was provided an opportunity to ask questions and all were answered. The patient agreed with the plan and demonstrated an understanding of the instructions.  Patient advised to call back or seek an in-person evaluation if the symptoms or condition worsens.  Duration of encounter: 30 minutes.  Total time on encounter, as per AMA guidelines included both the face-to-face and non-face-to-face time personally spent by the physician and/or other qualified health care professional(s) on the day of the encounter (includes time in activities that require the physician or other qualified health care  professional and does not include time in activities normally performed by clinical staff). Physician's time may include the following activities when performed: Preparing to see the patient (e.g., pre-charting review of records, searching for previously ordered imaging, lab work, and nerve conduction tests) Review of prior analgesic pharmacotherapies. Reviewing PMP Interpreting ordered tests (e.g., lab work, imaging, nerve conduction tests) Performing post-procedure evaluations, including interpretation of diagnostic procedures Obtaining and/or reviewing separately obtained history Performing a medically appropriate examination and/or evaluation Counseling and educating the patient/family/caregiver Ordering medications, tests, or procedures Referring and communicating with other health care professionals (when not separately reported) Documenting clinical information in the electronic or other health record Independently interpreting results (not separately reported) and communicating results to the patient/ family/caregiver Care coordination (not separately reported)  Note by: Karolynn Infantino K Carlin Mamone, NP (TTS and AI technology used. I apologize for any typographical errors that were not detected and corrected.) Date: 05/24/2024; Time: 9:01 AM

## 2024-05-24 NOTE — Progress Notes (Signed)
 Nursing Pain Medication Assessment:  Safety precautions to be maintained throughout the outpatient stay will include: orient to surroundings, keep bed in low position, maintain call bell within reach at all times, provide assistance with transfer out of bed and ambulation.  Medication Inspection Compliance: Pill count conducted under aseptic conditions, in front of the patient. Neither the pills nor the bottle was removed from the patient's sight at any time. Once count was completed pills were immediately returned to the patient in their original bottle.  Medication: Hydrocodone /APAP Pill/Patch Count: 6.5 of 30 pills/patches remain Pill/Patch Appearance: Markings consistent with prescribed medication Bottle Appearance: Standard pharmacy container. Clearly labeled. Filled Date: 06 / 30 / 2025 Last Medication intake:  Today

## 2024-08-21 ENCOUNTER — Ambulatory Visit: Attending: Nurse Practitioner | Admitting: Nurse Practitioner

## 2024-08-21 ENCOUNTER — Encounter: Payer: Self-pay | Admitting: Nurse Practitioner

## 2024-08-21 VITALS — BP 120/75 | HR 81 | Temp 97.2°F | Resp 18 | Ht 67.0 in | Wt 160.0 lb

## 2024-08-21 DIAGNOSIS — M25561 Pain in right knee: Secondary | ICD-10-CM | POA: Insufficient documentation

## 2024-08-21 DIAGNOSIS — M5441 Lumbago with sciatica, right side: Secondary | ICD-10-CM | POA: Insufficient documentation

## 2024-08-21 DIAGNOSIS — M542 Cervicalgia: Secondary | ICD-10-CM | POA: Diagnosis present

## 2024-08-21 DIAGNOSIS — G894 Chronic pain syndrome: Secondary | ICD-10-CM | POA: Insufficient documentation

## 2024-08-21 DIAGNOSIS — M79605 Pain in left leg: Secondary | ICD-10-CM | POA: Diagnosis present

## 2024-08-21 DIAGNOSIS — M961 Postlaminectomy syndrome, not elsewhere classified: Secondary | ICD-10-CM | POA: Diagnosis present

## 2024-08-21 DIAGNOSIS — M79622 Pain in left upper arm: Secondary | ICD-10-CM | POA: Diagnosis present

## 2024-08-21 DIAGNOSIS — M7918 Myalgia, other site: Secondary | ICD-10-CM | POA: Insufficient documentation

## 2024-08-21 DIAGNOSIS — G8929 Other chronic pain: Secondary | ICD-10-CM | POA: Insufficient documentation

## 2024-08-21 DIAGNOSIS — M5442 Lumbago with sciatica, left side: Secondary | ICD-10-CM | POA: Insufficient documentation

## 2024-08-21 DIAGNOSIS — Z79891 Long term (current) use of opiate analgesic: Secondary | ICD-10-CM | POA: Insufficient documentation

## 2024-08-21 DIAGNOSIS — M25562 Pain in left knee: Secondary | ICD-10-CM | POA: Diagnosis present

## 2024-08-21 DIAGNOSIS — M79621 Pain in right upper arm: Secondary | ICD-10-CM | POA: Insufficient documentation

## 2024-08-21 DIAGNOSIS — M79604 Pain in right leg: Secondary | ICD-10-CM | POA: Insufficient documentation

## 2024-08-21 DIAGNOSIS — M792 Neuralgia and neuritis, unspecified: Secondary | ICD-10-CM | POA: Insufficient documentation

## 2024-08-21 MED ORDER — HYDROCODONE-ACETAMINOPHEN 10-325 MG PO TABS: 0.5000 | ORAL_TABLET | Freq: Two times a day (BID) | ORAL | 0 refills | Status: DC | PRN

## 2024-08-21 NOTE — Progress Notes (Signed)
 PROVIDER NOTE: Interpretation of information contained herein should be left to medically-trained personnel. Specific patient instructions are provided elsewhere under Patient Instructions section of medical record. This document was created in part using AI and STT-dictation technology, any transcriptional errors that may result from this process are unintentional.  Patient: Stephanie [name]  Service: E/M   PCP: Steva Clotilda DEL, NP  DOB: [birth year]  DOS: 08/21/2024  Provider: Emmy MARLA Blanch, NP  MRN: [id number]  Delivery: Face-to-face  Specialty: Interventional Pain Management  Type: Established Patient  Setting: Ambulatory outpatient facility  Specialty designation: 09  Referring Prov.: Steva Clotilda DEL, NP  Location: Outpatient office facility       History of present illness (HPI) Stephanie [name], a 1 y.o. year old female, is here today because of her low back pain and left knee pain. [name] primary complain today is Back Pain (Lower Back)  Pertinent problems: [name]  has Lumbar facet syndrome (Bilateral) (L>R); Chronic low back pain (1ry area of pain) (Bilateral) (L>R); Chronic meniscal tear of knee (Right); Lumbar spondylosis; Epidural fibrosis; Spondylolisthesis of lumbar region; Chronic lower extremity pain (2ry area of pain) (Bilateral) (L>R); Chronic lumbar radicular pain (Right) (S1); Musculoskeletal pain; Neurogenic pain; Spasm of paraspinal muscle; Chronic hip pain (Bilateral) (R>L); Osteoarthritis of knee (Bilateral); and Chronic pain syndrome on their pertinent problem list.  Pain Assessment: Severity of Chronic pain is reported as a 5 /10. Location: Back Lower/Bilateral Legs. Onset: More than a month ago. Quality: Sharp, Constant. Timing: Constant. Modifying factor(s): Denies. Vitals:  height is 5' 7 (1.702 m) and weight is 160 lb (72.6 kg). Her temporal temperature is 97.2 F (36.2 C) (abnormal). Her blood pressure is 120/75 and her pulse is 81. Her  respiration is 18 and oxygen saturation is 99%.  BMI: Estimated body mass index is 25.06 kg/m as calculated from the following:   Height as of this encounter: 5' 7 (1.702 m).   Weight as of this encounter: 160 lb (72.6 kg).  Last encounter: 05/24/2024. Last procedure: Visit date not found.  Reason for encounter: medication management. The patient indicates doing well with the current medication regimen. No adverse reaction or side effects reported to the medication.   Discussed the use of AI scribe software for clinical note transcription with the patient, who gave verbal consent to proceed.  History of Present Illness   Stephanie [name] is a 63 year old female who presents for pain management.  She experiences chronic pain primarily in her low back and left knee. The left knee is described as 'bone on bone', and she has previously undergone knee replacement surgery on the right knee. Despite the surgery, she still experiences significant pain and limited mobility, stating she 'still can't bend it' without severe pain.  Physical therapy was attempted, but it was painful, requiring medication to manage the discomfort during sessions. She takes 10 mg of hydrocodone  daily, divided into half in the morning and half in the evening. She supplements with Tylenol  as needed, ensuring not to exceed the recommended daily dose. She inquires about the possibility of rotating Tylenol  with ibuprofen but currently sticks to Tylenol . She reports no side effects such as constipation from her current medication regimen.     Pharmacotherapy Assessment   Hydrocodone -acetaminophen  (Norco) 10-325 1/2 tab every 12 hours as needed for severe pain. MME= 10  Monitoring: [place] PMP: PDMP reviewed during this encounter.       Pharmacotherapy: No side-effects or adverse reactions reported. Compliance:  No problems identified. Effectiveness: Clinically acceptable.  Stephanie [name] [place], [place]  08/21/2024  8:17 AM  Sign when  Signing Visit Nursing Pain Medication Assessment:  Safety precautions to be maintained throughout the outpatient stay will include: orient to surroundings, keep bed in low position, maintain call bell within reach at all times, provide assistance with transfer out of bed and ambulation.  Medication Inspection Compliance: Pill count conducted under aseptic conditions, in front of the patient. Neither the pills nor the bottle was removed from the patient's sight at any time. Once count was completed pills were immediately returned to the patient in their original bottle.  Medication: Hydrocodone /APAP Pill/Patch Count: 13.5 of 30 pills/patches remain Pill/Patch Appearance: Markings consistent with prescribed medication Bottle Appearance: Standard pharmacy container. Clearly labeled. Filled Date: 10 / 02 / 2025 Last Medication intake:  Today    UDS:  Summary  Date Value Ref Range Status  02/28/2024 FINAL  Final    Comment:    ==================================================================== [employer] [facility] (MW) ==================================================================== Test                             Result       Flag       Units  Drug Present and Declared for Prescription Verification   Lorazepam                       415          EXPECTED   ng/mg creat    Source of lorazepam  is a scheduled prescription medication.    Hydrocodone                     1807         EXPECTED   ng/mg creat   Hydromorphone                   256          EXPECTED   ng/mg creat   Dihydrocodeine                 167          EXPECTED   ng/mg creat   Norhydrocodone                 1722         EXPECTED   ng/mg creat    Sources of hydrocodone  include scheduled prescription medications.    Hydromorphone , dihydrocodeine and norhydrocodone are expected    metabolites of hydrocodone . Hydromorphone  and dihydrocodeine are    also available as scheduled prescription  medications.  ==================================================================== Test                      Result    Flag   Units      Ref Range   Creatinine              241              mg/dL      >=79 ==================================================================== Declared Medications:  The flagging and interpretation on this report are based on the  following declared medications.  Unexpected results may arise from  inaccuracies in the declared medications.   **Note: The testing scope of this panel includes these medications:   Hydrocodone  (Norco)  Lorazepam  (Ativan )   **Note: The testing scope of this panel does not include the  following reported medications:   Acetaminophen  (  Norco)  Amoxicillin (Amoxil)  Aspirin   Cholecalciferol  Cyclobenzaprine  (Flexeril )  Dulaglutide (Trulicity)  Escitalopram  (Lexapro )  Gabapentin  (Neurontin )  Ibuprofen (Advil)  Lurasidone (Latuda)  Melatonin  Meloxicam  (Mobic )  Naloxone  (Narcan )  Tirzepatide (Mounjaro)  Topical  Vitamin B12 ==================================================================== For clinical consultation, please call [phone]. ====================================================================     No results found for: CBDTHCR No results found for: D8THCCBX No results found for: D9THCCBX  ROS  Constitutional: Denies any fever or chills Gastrointestinal: No reported hemesis, hematochezia, vomiting, or acute GI distress Musculoskeletal: low back pain, left knee pain Neurological: No reported episodes of acute onset apraxia, aphasia, dysarthria, agnosia, amnesia, paralysis, loss of coordination, or loss of consciousness  Medication Review  Cholecalciferol, Dulaglutide, HYDROcodone -acetaminophen , LORazepam , Melatonin, amoxicillin, aspirin  EC, cyanocobalamin , cyclobenzaprine , escitalopram , gabapentin , ibuprofen, lurasidone, meloxicam , naloxone , tirzepatide, and triamcinolone  ointment  History  Review  Allergy: Ms. Dorlene is allergic to phenergan  [promethazine  hcl]. Drug: Ms. Dorlene  reports no history of drug use. Alcohol:  reports no history of alcohol use. Tobacco:  reports that she has never smoked. She has never used smokeless tobacco. Social: Ms. Dorlene  reports that she has never smoked. She has never used smokeless tobacco. She reports that she does not drink alcohol and does not use drugs. Medical:  has a past medical history of Allergy, Anemia, iron deficiency (04/09/2014), Arthritis, Atypical chest pain (04/09/2014), CD (contact dermatitis) (03/01/2014), Chest pain (04/09/2014), Chronic pain syndrome, Degenerative disc disease, lumbar, Depression, Diabetes mellitus without complication (HCC), Lumbar radicular pain (08/22/2015), Lumbar spinal stenosis, Neuromuscular disorder (HCC), Osteoarthritis, Vitamin B12 deficiency, and Vitamin D  deficiency. Surgical: Ms. Dorlene  has a past surgical history that includes Lumbar spine surgery (2010); Appendectomy (2015); Cholecystectomy (2014); Cesarean section (1990); Bunionectomy (Bilateral, 2014); Cesarean section (1991); Knee arthroscopy (Right, 2014); Knee Arthroplasty (Right, 12/07/2022); and Knee arthroscopy (Right, 03/10/2023). Family: family history includes Asthma in her father; Stroke in her mother.  Laboratory Chemistry Profile   Renal Lab Results  Component Value Date   BUN 14 11/24/2022   CREATININE 0.66 11/24/2022   GFRAA >60 09/01/2019   GFRNONAA >60 11/24/2022    Hepatic Lab Results  Component Value Date   AST 16 11/24/2022   ALT 11 11/24/2022   ALBUMIN 4.1 11/24/2022   ALKPHOS 48 11/24/2022    Electrolytes Lab Results  Component Value Date   NA 138 11/24/2022   K 3.5 11/24/2022   CL 109 11/24/2022   CALCIUM 8.7 (L) 11/24/2022   MG 1.9 09/01/2019    Bone Lab Results  Component Value Date   VD25OH 33.18 09/01/2019   25OHVITD1 34 05/07/2016   25OHVITD2 <1.0 05/07/2016   25OHVITD3 34 05/07/2016     Inflammation (CRP: Acute Phase) (ESR: Chronic Phase) Lab Results  Component Value Date   CRP 0.9 03/09/2023   ESRSEDRATE 5 03/09/2023         Note: Above Lab results reviewed.  Recent Imaging Review  DG Knee Right Port CLINICAL DATA:  232140 Total knee replacement status 232140  EXAM: PORTABLE RIGHT KNEE - 1-2 VIEW  COMPARISON:  12/21/2017  FINDINGS: Interval postsurgical changes from right total knee arthroplasty. Arthroplasty components are in their expected alignment. No periprosthetic fracture or evidence of other complication. Expected postoperative changes within the overlying soft tissues.  IMPRESSION: Status post right total knee arthroplasty without evidence of complication.  Electronically Signed   By: Mabel Converse D.O.   On: 12/07/2022 14:59 Note: Reviewed        Physical Exam  Vitals: BP 120/75 (BP Location:  Right Arm, Patient Position: Sitting, Cuff Size: Normal)   Pulse 81   Temp (!) 97.2 F (36.2 C) (Temporal)   Resp 18   Ht 5' 7 (1.702 m)   Wt 160 lb (72.6 kg)   LMP  (LMP Unknown)   SpO2 99%   BMI 25.06 kg/m  BMI: Estimated body mass index is 25.06 kg/m as calculated from the following:   Height as of this encounter: 5' 7 (1.702 m).   Weight as of this encounter: 160 lb (72.6 kg). Ideal: Ideal body weight: 61.6 kg (135 lb 12.9 oz) Adjusted ideal body weight: 66 kg (145 lb 7.7 oz) General appearance: Well nourished, well developed, and well hydrated. In no apparent acute distress Mental status: Alert, oriented x 3 (person, place, & time)       Respiratory: No evidence of acute respiratory distress Eyes: PERLA  Musculoskeletal: + LBP, Left knee pain worse with weight bearing and walking  Assessment   Diagnosis Status  1. Chronic low back pain (1ry area of Pain) (Bilateral) (L>R)   2. Chronic knee pain (3ry area of Pain) (Bilateral) (R>L)   3. Chronic pain syndrome   4. Chronic lower extremity pain (2ry area of Pain) (Bilateral)  (L>R)   5. Cervicalgia   6. Failed back surgical syndrome   7. Chronic upper extremity pain (Bilateral)   8. Chronic use of opiate for therapeutic purpose   9. Musculoskeletal pain   10. Neurogenic pain    Controlled Controlled Controlled   Updated Problems: No problems updated.  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 No need for gabapentin . - Continue hydrocodone , split dose morning and evening. - Advise acetaminophen  as needed, max 3000-4000 mg/day in 24 hrs. Do not exceed more than 4000 mg/day in 24 hrs.  - Monitor for constipation; consider stool softener, Miralax, or increased dietary fiber if needed. Patient's pain is well controlled with hydrocodone ,  will continue on current medication regimen.  Prescribing drug monitoring (PDMP) reviewed; findings consistent with the use of prescribed medication and no evidence of narcotic misuse or abuse.  Urine drug screening (UDS) up-to-date.  Schedule follow-up in 90 days for medication management.   Chronic left knee pain, status post right knee replacement : Chronic left knee pain with bone-on-bone changes. No plans for left knee surgery. - Continue acetaminophen  as needed. - No current plans for left knee surgery.    HYDROcodone -acetaminophen  (NORCO) 10-325 MG tablet      Sig: Take 0.5 tablets by mouth every 12 (twelve) hours as needed for severe pain (pain score 7-10). Must last 30 days      Dispense:  30 tablet      Refill:  0      DO NOT: delete (not duplicate); no partial-fill (will deny script to complete), no refill request (F/U required). DISPENSE: 1 day early if closed on fill date. WARN: No CNS-depressants within 8 hrs of med.   HYDROcodone -acetaminophen  (NORCO) 10-325 MG tablet      Sig: Take 0.5 tablets by mouth every 12 (twelve) hours as needed for severe pain (pain score 7-10). Must last 30  days      Dispense:  30 tablet      Refill:  0      DO NOT: delete (not duplicate); no partial-fill (will deny script to complete), no refill request (F/U required). DISPENSE: 1 day early  if closed on fill date. WARN: No CNS-depressants within 8 hrs of med.   HYDROcodone -acetaminophen  (NORCO) 10-325 MG tablet      Sig: Take 0.5 tablets by mouth every 12 (twelve) hours as needed for severe pain (pain score 7-10). Must last 30 days      Dispense:  30 tablet      Refill:  0      DO NOT: delete (not duplicate); no partial-fill (will deny script to complete), no refill request (F/U required). DISPENSE: 1 day early if closed on fill date. WARN: No CNS-depressants within 8 hrs of med.   Chronic pain syndrome:    HYDROcodone -acetaminophen  (NORCO) 10-325 MG tablet      Sig: Take 0.5 tablets by mouth every 12 (twelve) hours as needed for severe pain (pain score 7-10). Must last 30 days      Dispense:  30 tablet      Refill:  0      DO NOT: delete (not duplicate); no partial-fill (will deny script to complete), no refill request (F/U required). DISPENSE: 1 day early if closed on fill date. WARN: No CNS-depressants within 8 hrs of med.   HYDROcodone -acetaminophen  (NORCO) 10-325 MG tablet      Sig: Take 0.5 tablets by mouth every 12 (twelve) hours as needed for severe pain (pain score 7-10). Must last 30 days      Dispense:  30 tablet      Refill:  0      DO NOT: delete (not duplicate); no partial-fill (will deny script to complete), no refill request (F/U required). DISPENSE: 1 day early if closed on fill date. WARN: No CNS-depressants within 8 hrs of med.   HYDROcodone -acetaminophen  (NORCO) 10-325 MG tablet      Sig: Take 0.5 tablets by mouth every 12 (twelve) hours as needed for severe pain (pain score 7-10). Must last 30 days      Dispense:  30 tablet      Refill:  0      DO NOT: delete (not duplicate); no partial-fill (will deny script to complete), no refill request (F/U required). DISPENSE: 1  day early if closed on fill date. WARN: No CNS-depressants within 8 hrs of med.         Ms. Diany Formosa has a current medication list which includes the following long-term medication(s): cyclobenzaprine , escitalopram , gabapentin , meloxicam , triamcinolone  ointment, [START ON 09/02/2024] hydrocodone -acetaminophen , [START ON 10/02/2024] hydrocodone -acetaminophen , and [START ON 11/01/2024] hydrocodone -acetaminophen .  Pharmacotherapy (Medications Ordered): Meds ordered this encounter  Medications   HYDROcodone -acetaminophen  (NORCO) 10-325 MG tablet    Sig: Take 0.5 tablets by mouth every 12 (twelve) hours as needed for severe pain (pain score 7-10). Must last 30 days    Dispense:  30 tablet    Refill:  0    DO NOT: delete (not duplicate); no partial-fill (will deny script to complete), no refill request (F/U required). DISPENSE: 1 day early if closed on fill date. WARN: No CNS-depressants within 8 hrs of med.   HYDROcodone -acetaminophen  (NORCO) 10-325 MG tablet    Sig: Take 0.5 tablets by mouth every 12 (twelve) hours as needed for severe pain (pain score 7-10). Must last 30 days    Dispense:  30 tablet    Refill:  0    DO NOT: delete (not duplicate); no partial-fill (will deny script to complete), no refill request (F/U required). DISPENSE: 1 day early if closed on fill date. WARN: No CNS-depressants within 8 hrs of med.   HYDROcodone -acetaminophen  (NORCO)  10-325 MG tablet    Sig: Take 0.5 tablets by mouth every 12 (twelve) hours as needed for severe pain (pain score 7-10). Must last 30 days    Dispense:  30 tablet    Refill:  0    DO NOT: delete (not duplicate); no partial-fill (will deny script to complete), no refill request (F/U required). DISPENSE: 1 day early if closed on fill date. WARN: No CNS-depressants within 8 hrs of med.   Orders:  No orders of the defined types were placed in this encounter.       Return in about 3 months (around 11/21/2024) for (F2F), (MM), Emmy Blanch NP.    Recent Visits Date Type Provider Dept  05/24/24 Office Visit Marsha Gundlach K, NP [facility]  Showing recent visits within past 90 days and meeting all other requirements Today's Visits Date Type Provider Dept  08/21/24 Office Visit Witt Plitt K, NP [facility]  Showing today's visits and meeting all other requirements Future Appointments No visits were found meeting these conditions. Showing future appointments within next 90 days and meeting all other requirements  I discussed the assessment and treatment plan with the patient. The patient was provided an opportunity to ask questions and all were answered. The patient agreed with the plan and demonstrated an understanding of the instructions.  Patient advised to call back or seek an in-person evaluation if the symptoms or condition worsens.  I personally spent a total of 30 minutes in the care of the patient today including preparing to see the patient, getting/reviewing separately obtained history, performing a medically appropriate exam/evaluation, counseling and educating, placing orders, referring and communicating with other health care professionals, documenting clinical information in the EHR, independently interpreting results, communicating results, and coordinating care.   Note by: Leahmarie Gasiorowski K Saadia Dewitt, NP (TTS and AI technology used. I apologize for any typographical errors that were not detected and corrected.) Date: 08/21/2024; Time: 8:37 AM

## 2024-08-21 NOTE — Progress Notes (Signed)
 Nursing Pain Medication Assessment:  Safety precautions to be maintained throughout the outpatient stay will include: orient to surroundings, keep bed in low position, maintain call bell within reach at all times, provide assistance with transfer out of bed and ambulation.  Medication Inspection Compliance: Pill count conducted under aseptic conditions, in front of the patient. Neither the pills nor the bottle was removed from the patient's sight at any time. Once count was completed pills were immediately returned to the patient in their original bottle.  Medication: Hydrocodone /APAP Pill/Patch Count: 13.5 of 30 pills/patches remain Pill/Patch Appearance: Markings consistent with prescribed medication Bottle Appearance: Standard pharmacy container. Clearly labeled. Filled Date: 10 / 02 / 2025 Last Medication intake:  Today

## 2024-11-20 NOTE — Progress Notes (Unsigned)
 PROVIDER NOTE: Interpretation of information contained herein should be left to medically-trained personnel. Specific patient instructions are provided elsewhere under Patient Instructions section of medical record. This document was created in part using AI and STT-dictation technology, any transcriptional errors that may result from this process are unintentional.  Patient: Stephanie [name]  Service: E/M   PCP: Steva Clotilda DEL, NP  DOB: November 27, 1960  DOS: 11/21/2024  Provider: Emmy MARLA Blanch, NP  MRN: [id number]  Delivery: Face-to-face  Specialty: Interventional Pain Management  Type: Established Patient  Setting: Ambulatory outpatient facility  Specialty designation: 09  Referring Prov.: Steva Clotilda DEL, NP  Location: Outpatient office facility       History of present illness (HPI) [name], a 85 y.o. year old female, is here today because of her lower back pain and left knee pain. [name] primary complain today is Back Pain and left knee pain.   Pertinent problems: 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); Musculoskeletal pain; Neurogenic pain; Chronic hip pain (Bilateral) (R>L); Osteoarthritis of knee (Bilateral); Osteoarthritis of hip (Bilateral); Osteoarthritis of sacroiliac joint (Bilateral); Chronic sacroiliac joint pain (Bilateral); Chronic pain syndrome; Pharmacologic  therapy; Disorder of skeletal system; Problems influencing health status; Failed back surgical syndrome; 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  Pain Assessment: Severity of Chronic pain is reported as a 5 /10. Location: Back Lower/Down Bilateral legs, bilateral feet and bilateral toes. Onset: More than a month ago. Quality: Tingling. Timing: Intermittent. Modifying factor(s): Pain medication. Vitals:  height is 5' 7 (1.702 m) and weight is 156 lb (70.8 kg). Her temporal temperature is 97.6 F (36.4 C). Her blood pressure is 121/76 and her pulse is 75. Her respiration is 18 and oxygen saturation is 96%.  BMI: Estimated body mass index is 24.43 kg/m as calculated from the following:   Height as of this encounter: 5' 7 (1.702 m).   Weight as of this encounter: 156 lb (70.8 kg).  Last encounter: 08/21/2024. Last procedure: Visit date not found.  Reason for encounter: medication management. The patient indicates doing well with current medication regimen. No side effects and adverse reaction reported to medication.   Discussed the use of AI scribe software for clinical note transcription with the patient, who gave verbal consent to proceed.  History of Present Illness   Stephanie [name] is a 64 year old female with a history of knee replacement and spinal stenosis who presents with left knee pain and low back pain.  She experiences significant left knee pain, rated as 8/10, which worsens with walking and is particularly severe at night. The pain affects her daily activities,  making walking difficult, although she continues to perform  necessary tasks. She uses a walker but not consistently. The left knee pain began after compensating for her right knee, which underwent replacement surgery in February 2024. Despite the surgery, she has difficulty bending her knees and experiences swelling by the end of the day. She has not tried any treatments for the left knee pain.  Her right knee was replaced in February 2024, and although the surgery alleviated some pain, it did not improve her range of motion. She underwent a manipulation under anesthesia in May 2024 to improve bending, but she still experiences pain and stiffness. She previously received cortisone shots in her knees, which helped before the surgery, but has not had any since the replacement.  She also experiences chronic low back pain, primarily in the middle and radiating to the left side, sometimes extending to her legs and feet. She has a history of spinal stenosis and had rods placed in her L5 in 2010. Currently, her low back pain is rated 5/10, and she manages it with Norco (hydrocodone -acetaminophen ) taken as half a pill twice daily, and occasionally Tylenol .  She is diabetic, which may contribute to her symptoms, particularly in her feet. She manages her pain primarily with Norco and Tylenol .     Pharmacotherapy Assessment   Hydrocodone -acetaminophen  (Norco) 10-325 1/2 tab every 12 hours as needed for severe pain. MME= 10  Monitoring: [place] PMP: PDMP reviewed during this encounter.       Pharmacotherapy: No side-effects or adverse reactions reported. Compliance: No problems identified. Effectiveness: Clinically acceptable.  [name] [place]UN[place], [place]  11/21/2024  8:13 AM  Sign when Signing Visit Nursing Pain Medication Assessment:  Safety precautions to be maintained throughout the outpatient stay will include: orient to surroundings, keep bed in low position, maintain call bell within reach at all times, provide  assistance with transfer out of bed and ambulation.  Medication Inspection Compliance: Pill count conducted under aseptic conditions, in front of the patient. Neither the pills nor the bottle was removed from the patient's sight at any time. Once count was completed pills were immediately returned to the patient in their original bottle.  Medication: Hydrocodone /APAP Pill/Patch Count: 12.5 of 30 pills/patches remain Pill/Patch Appearance: Markings consistent with prescribed medication Bottle Appearance: Standard pharmacy container. Clearly labeled. Filled Date: 01 / 01 / 2026 Last Medication intake:  Today    UDS:  Summary  Date Value Ref Range Status  02/28/2024 FINAL  Final    Comment:    ==================================================================== [employer] [facility] 13 (MW) ==================================================================== Test                             Result       Flag       Units  Drug Present and Declared for Prescription Verification   Lorazepam                       415          EXPECTED   ng/mg creat    Source of lorazepam  is a scheduled prescription medication.    Hydrocodone                     1807         EXPECTED   ng/mg creat   Hydromorphone                   256          EXPECTED  ng/mg creat   Dihydrocodeine                 167          EXPECTED   ng/mg creat   Norhydrocodone                 1722         EXPECTED   ng/mg creat    Sources of hydrocodone  include scheduled prescription medications.    Hydromorphone , dihydrocodeine and norhydrocodone are expected    metabolites of hydrocodone . Hydromorphone  and dihydrocodeine are    also available as scheduled prescription medications.  ==================================================================== Test                      Result    Flag   Units      Ref Range   Creatinine              241              mg/dL       >=79 ==================================================================== Declared Medications:  The flagging and interpretation on this report are based on the  following declared medications.  Unexpected results may arise from  inaccuracies in the declared medications.   **Note: The testing scope of this panel includes these medications:   Hydrocodone  (Norco)  Lorazepam  (Ativan )   **Note: The testing scope of this panel does not include the  following reported medications:   Acetaminophen  (Norco)  Amoxicillin (Amoxil)  Aspirin   Cholecalciferol  Cyclobenzaprine  (Flexeril )  Dulaglutide (Trulicity)  Escitalopram  (Lexapro )  Gabapentin  (Neurontin )  Ibuprofen (Advil)  Lurasidone (Latuda)  Melatonin  Meloxicam  (Mobic )  Naloxone  (Narcan )  Tirzepatide (Mounjaro)  Topical  Vitamin B12 ==================================================================== For clinical consultation, please call [phone]. ====================================================================     No results found for: CBDTHCR No results found for: D8THCCBX No results found for: D9THCCBX  ROS  Constitutional: Denies any fever or chills Gastrointestinal: No reported hemesis, hematochezia, vomiting, or acute GI distress Musculoskeletal: low back pain, left knee pain and swelling  Neurological: No reported episodes of acute onset apraxia, aphasia, dysarthria, agnosia, amnesia, paralysis, loss of coordination, or loss of consciousness  Medication Review  Cholecalciferol, HYDROcodone -acetaminophen , LORazepam , Melatonin, amoxicillin, aspirin  EC, cyanocobalamin , cyclobenzaprine , escitalopram , gabapentin , ibuprofen, lurasidone, meloxicam , naloxone , tirzepatide, and triamcinolone  ointment  History Review  Allergy: Ms. Dorlene is allergic to phenergan  [promethazine  hcl]. Drug: Ms. Dorlene  reports no history of drug use. Alcohol:  reports no history of alcohol use. Tobacco:  reports that she has  never smoked. She has never used smokeless tobacco. Social: Ms. Dorlene  reports that she has never smoked. She has never used smokeless tobacco. She reports that she does not drink alcohol and does not use drugs. Medical:  has a past medical history of Allergy, Anemia, iron deficiency (04/09/2014), Arthritis, Atypical chest pain (04/09/2014), CD (contact dermatitis) (03/01/2014), Chest pain (04/09/2014), Chronic pain syndrome, Degenerative disc disease, lumbar, Depression, Diabetes mellitus without complication (HCC), Lumbar radicular pain (08/22/2015), Lumbar spinal stenosis, Neuromuscular disorder (HCC), Osteoarthritis, Vitamin B12 deficiency, and Vitamin D  deficiency. Surgical: Ms. Dorlene  has a past surgical history that includes Lumbar spine surgery (2010); Appendectomy (2015); Cholecystectomy (2014); Cesarean section (1990); Bunionectomy (Bilateral, 2014); Cesarean section (1991); Knee arthroscopy (Right, 2014); Knee Arthroplasty (Right, 12/07/2022); and Knee arthroscopy (Right, 03/10/2023). Family: family history includes Asthma in her father; Stroke in her mother.  Laboratory Chemistry Profile   Renal Lab Results  Component Value Date   BUN 14 11/24/2022  CREATININE 0.66 11/24/2022   GFRAA >60 09/01/2019   GFRNONAA >60 11/24/2022    Hepatic Lab Results  Component Value Date   AST 16 11/24/2022   ALT 11 11/24/2022   ALBUMIN 4.1 11/24/2022   ALKPHOS 48 11/24/2022    Electrolytes Lab Results  Component Value Date   NA 138 11/24/2022   K 3.5 11/24/2022   CL 109 11/24/2022   CALCIUM 8.7 (L) 11/24/2022   MG 1.9 09/01/2019    Bone Lab Results  Component Value Date   VD25OH 33.18 09/01/2019   25OHVITD1 34 05/07/2016   25OHVITD2 <1.0 05/07/2016   25OHVITD3 34 05/07/2016    Inflammation (CRP: Acute Phase) (ESR: Chronic Phase) Lab Results  Component Value Date   CRP 0.9 03/09/2023   ESRSEDRATE 5 03/09/2023         Note: Above Lab results reviewed.  Recent Imaging Review   DG Knee Right Port CLINICAL DATA:  232140 Total knee replacement status 232140  EXAM: PORTABLE RIGHT KNEE - 1-2 VIEW  COMPARISON:  12/21/2017  FINDINGS: Interval postsurgical changes from right total knee arthroplasty. Arthroplasty components are in their expected alignment. No periprosthetic fracture or evidence of other complication. Expected postoperative changes within the overlying soft tissues.  IMPRESSION: Status post right total knee arthroplasty without evidence of complication.  Electronically Signed   By: Mabel Converse D.O.   On: 12/07/2022 14:59 Note: Reviewed        Physical Exam  Vitals: BP 121/76 (BP Location: Right Arm, Patient Position: Sitting, Cuff Size: Normal)   Pulse 75   Temp 97.6 F (36.4 C) (Temporal)   Resp 18   Ht 5' 7 (1.702 m)   Wt 156 lb (70.8 kg)   LMP  (LMP Unknown)   SpO2 96%   BMI 24.43 kg/m  BMI: Estimated body mass index is 24.43 kg/m as calculated from the following:   Height as of this encounter: 5' 7 (1.702 m).   Weight as of this encounter: 156 lb (70.8 kg). Ideal: Ideal body weight: 61.6 kg (135 lb 12.9 oz) Adjusted ideal body weight: 65.3 kg (143 lb 14.1 oz) General appearance: Well nourished, well developed, and well hydrated. In no apparent acute distress Mental status: Alert, oriented x 3 (person, place, & time)       Respiratory: No evidence of acute respiratory distress Eyes: PERLA  Musculoskeletal: +LBP Left knee pain worse with walking (severe at night)  Lumbar Exam  Skin & Axial Inspection: No masses, redness, or swelling Alignment: Symmetrical Functional ROM: Pain restricted ROM       Stability: No instability detected Muscle Tone/Strength: Functionally intact. No obvious neuro-muscular anomalies detected. Sensory (Neurological): Facet loading pain Palpation: No palpable anomalies       Provocative Tests: Hyperextension/rotation test: (+) bilaterally for facet joint pain. Lumbar quadrant test (Kemp's  test): (+) bilaterally for facet joint pain.  Gait & Posture Assessment  Ambulation: Unassisted Gait: Relatively normal for age and body habitus Posture: WNL  Neuro/Reflex Exam    Sensation: Pain & Temperature (spinothalamic tracts) is Normal Position and vibration (dorsal columns)            Side: Right  Side: Left  Reflexes:  Patellar: 2+ Achilles: 2+  Reflexes:  Patellar: 2+ Achilles: 2+   Lower Extremity Exam    Side: Right lower extremity  Side: Left lower extremity  Stability: No instability observed          Stability: No instability observed  Skin & Extremity Inspection: Skin color, temperature, and hair growth are WNL. No peripheral edema or cyanosis. No masses, redness, swelling, asymmetry, or associated skin lesions. No contractures.  Skin & Extremity Inspection: Skin color, temperature, and hair growth are WNL. No peripheral edema or cyanosis. No masses, redness, swelling, asymmetry, or associated skin lesions. No contractures.  Functional ROM: Pain restricted ROM for knee joint          Functional ROM: Pain restricted ROM for knee joint          Muscle Tone/Strength: Functionally intact. No obvious neuro-muscular anomalies detected.  Muscle Tone/Strength: Functionally intact. No obvious neuro-muscular anomalies detected.  Sensory (Neurological): Unimpaired        Sensory (Neurological): Unimpaired        DTR: Patellar: 2+: normal Achilles: 2+: normal Plantar: deferred today  DTR: Patellar: 2+: normal Achilles: 2+: normal Plantar: deferred today  Palpation: No palpable anomalies  Palpation: No palpable anomalies   Assessment   Diagnosis Status  1. Chronic low back pain (1ry area of Pain) (Bilateral) (L>R)   2. Chronic knee pain (3ry area of Pain) (Bilateral) (R>L)   3. Pharmacologic therapy   4. Eczema of hands (Bilateral) (R>L)   5. Chronic use of opiate for therapeutic purpose   6. Chronic lower extremity pain (2ry area of Pain) (Bilateral) (L>R)   7.  Cervicalgia   8. Failed back surgical syndrome   9. Chronic upper extremity pain (Bilateral)   10. Chronic pain syndrome   11. Musculoskeletal pain    Controlled Controlled Controlled   Updated Problems: No problems updated.  Plan of Care  Problem-specific:  Assessment and Plan    Chronic bilateral knee pain with limited range of motion Chronic bilateral knee pain, more severe in the left knee, with limited range of motion and swelling. Previous right knee replacement with persistent stiffness. Discussed cortisone injections, physical therapy, and heat application for symptom relief. - Consider cortisone injections for left knee pain. - Apply ice to left knee to reduce swelling. - Elevate leg at heart level to reduce swelling. - Use heat application to improve joint mobility. - Encouraged physical therapy exercises to improve range of motion.  Chronic low back pain with bilateral sciatica and failed back surgery syndrome Chronic low back pain with bilateral sciatica, well-managed with current medication.  - Continue current pain management regimen with Norco and Tylenol .  Chronic opioid therapy for pain management Chronic opioid therapy with Norco for pain management. Advised to avoid exceeding 4000 mg of acetaminophen  per day. No reported side effects. - Continue current Norco regimen, ensuring not to exceed 4000 mg of acetaminophen  per day. Patient's pain is controlled with hydrocodone , will continue on current medication regimen.  Prescribing drug monitoring (PDMP) reviewed; findings consistent with the use of prescribed medication and no evidence of narcotic misuse or abuse.  Urine drug screening (UDS) up-to-date.  Schedule follow-up in 90 days for medication management.   Eczema of both hands Eczema exacerbated by dryness and cold weather. Previous triamcinolone  ointment effective but out of stock. Improvement with lotions noted. - Reorder triamcinolone  ointment for eczema  management. - Advised continued use of lotions to maintain skin hydration.       Ms. Gretta Samons has a current medication list which includes the following long-term medication(s): cyclobenzaprine , escitalopram , gabapentin , [START ON 12/02/2024] hydrocodone -acetaminophen , [START ON 01/01/2025] hydrocodone -acetaminophen , [START ON 01/31/2025] hydrocodone -acetaminophen , meloxicam , and triamcinolone  ointment.  Pharmacotherapy (Medications Ordered): Meds ordered this encounter  Medications   HYDROcodone -acetaminophen  (NORCO)  10-325 MG tablet    Sig: Take 0.5 tablets by mouth every 12 (twelve) hours as needed for severe pain (pain score 7-10). Must last 30 days    Dispense:  30 tablet    Refill:  0    DO NOT: delete (not duplicate); no partial-fill (will deny script to complete), no refill request (F/U required). DISPENSE: 1 day early if closed on fill date. WARN: No CNS-depressants within 8 hrs of med.   HYDROcodone -acetaminophen  (NORCO) 10-325 MG tablet    Sig: Take 0.5 tablets by mouth every 12 (twelve) hours as needed for severe pain (pain score 7-10). Must last 30 days    Dispense:  30 tablet    Refill:  0    DO NOT: delete (not duplicate); no partial-fill (will deny script to complete), no refill request (F/U required). DISPENSE: 1 day early if closed on fill date. WARN: No CNS-depressants within 8 hrs of med.   HYDROcodone -acetaminophen  (NORCO) 10-325 MG tablet    Sig: Take 0.5 tablets by mouth every 12 (twelve) hours as needed for severe pain (pain score 7-10). Must last 30 days    Dispense:  30 tablet    Refill:  0    DO NOT: delete (not duplicate); no partial-fill (will deny script to complete), no refill request (F/U required). DISPENSE: 1 day early if closed on fill date. WARN: No CNS-depressants within 8 hrs of med.   triamcinolone  ointment (KENALOG ) 0.1 %    Sig: Apply topically 3 (three) times daily.    Dispense:  80 g    Refill:  2    Fill one day early if pharmacy is  closed on scheduled refill date. May substitute for generic if available.   Orders:  No orders of the defined types were placed in this encounter.       Return in about 3 months (around 02/19/2025) for (F2F), (MM), Emmy Blanch NP.    Recent Visits No visits were found meeting these conditions. Showing recent visits within past 90 days and meeting all other requirements Today's Visits Date Type Provider Dept  11/21/24 Office Visit Lani Mendiola K, NP [facility]  Showing today's visits and meeting all other requirements Future Appointments Date Type Provider Dept  02/12/25 Appointment Shatana Saxton K, NP [facility]  Showing future appointments within next 90 days and meeting all other requirements  I discussed the assessment and treatment plan with the patient. The patient was provided an opportunity to ask questions and all were answered. The patient agreed with the plan and demonstrated an understanding of the instructions.  Patient advised to call back or seek an in-person evaluation if the symptoms or condition worsens.  I personally spent a total of 30 minutes in the care of the patient today including preparing to see the patient, getting/reviewing separately obtained history, performing a medically appropriate exam/evaluation, counseling and educating, placing orders, referring and communicating with other health care professionals, documenting clinical information in the EHR, independently interpreting results, communicating results, and coordinating care.   Note by: Zayaan Kozak K Aristotle Lieb, NP (TTS and AI technology used. I apologize for any typographical errors that were not detected and corrected.) Date: 11/21/2024; Time: 10:50 AM

## 2024-11-21 ENCOUNTER — Ambulatory Visit: Attending: Nurse Practitioner | Admitting: Nurse Practitioner

## 2024-11-21 ENCOUNTER — Encounter: Payer: Self-pay | Admitting: Nurse Practitioner

## 2024-11-21 VITALS — BP 121/76 | HR 75 | Temp 97.6°F | Resp 18 | Ht 67.0 in | Wt 156.0 lb

## 2024-11-21 DIAGNOSIS — M79605 Pain in left leg: Secondary | ICD-10-CM | POA: Diagnosis present

## 2024-11-21 DIAGNOSIS — M961 Postlaminectomy syndrome, not elsewhere classified: Secondary | ICD-10-CM | POA: Diagnosis present

## 2024-11-21 DIAGNOSIS — G894 Chronic pain syndrome: Secondary | ICD-10-CM | POA: Insufficient documentation

## 2024-11-21 DIAGNOSIS — M542 Cervicalgia: Secondary | ICD-10-CM | POA: Diagnosis present

## 2024-11-21 DIAGNOSIS — M5441 Lumbago with sciatica, right side: Secondary | ICD-10-CM | POA: Diagnosis present

## 2024-11-21 DIAGNOSIS — M5442 Lumbago with sciatica, left side: Secondary | ICD-10-CM | POA: Diagnosis present

## 2024-11-21 DIAGNOSIS — M79604 Pain in right leg: Secondary | ICD-10-CM | POA: Diagnosis present

## 2024-11-21 DIAGNOSIS — M79621 Pain in right upper arm: Secondary | ICD-10-CM | POA: Insufficient documentation

## 2024-11-21 DIAGNOSIS — Z79899 Other long term (current) drug therapy: Secondary | ICD-10-CM | POA: Insufficient documentation

## 2024-11-21 DIAGNOSIS — M7918 Myalgia, other site: Secondary | ICD-10-CM | POA: Diagnosis present

## 2024-11-21 DIAGNOSIS — M545 Low back pain, unspecified: Secondary | ICD-10-CM | POA: Diagnosis not present

## 2024-11-21 DIAGNOSIS — G8929 Other chronic pain: Secondary | ICD-10-CM | POA: Insufficient documentation

## 2024-11-21 DIAGNOSIS — M25562 Pain in left knee: Secondary | ICD-10-CM | POA: Insufficient documentation

## 2024-11-21 DIAGNOSIS — M79622 Pain in left upper arm: Secondary | ICD-10-CM | POA: Insufficient documentation

## 2024-11-21 DIAGNOSIS — L309 Dermatitis, unspecified: Secondary | ICD-10-CM | POA: Insufficient documentation

## 2024-11-21 DIAGNOSIS — Z79891 Long term (current) use of opiate analgesic: Secondary | ICD-10-CM | POA: Insufficient documentation

## 2024-11-21 DIAGNOSIS — M25561 Pain in right knee: Secondary | ICD-10-CM | POA: Insufficient documentation

## 2024-11-21 MED ORDER — TRIAMCINOLONE ACETONIDE 0.1 % EX OINT: TOPICAL_OINTMENT | Freq: Three times a day (TID) | CUTANEOUS | 2 refills | Status: AC

## 2024-11-21 MED ORDER — HYDROCODONE-ACETAMINOPHEN 10-325 MG PO TABS: 0.5000 | ORAL_TABLET | Freq: Two times a day (BID) | ORAL | 0 refills | Status: AC | PRN

## 2024-11-21 NOTE — Progress Notes (Signed)
 Nursing Pain Medication Assessment:  Safety precautions to be maintained throughout the outpatient stay will include: orient to surroundings, keep bed in low position, maintain call bell within reach at all times, provide assistance with transfer out of bed and ambulation.  Medication Inspection Compliance: Pill count conducted under aseptic conditions, in front of the patient. Neither the pills nor the bottle was removed from the patient's sight at any time. Once count was completed pills were immediately returned to the patient in their original bottle.  Medication: Hydrocodone /APAP Pill/Patch Count: 12.5 of 30 pills/patches remain Pill/Patch Appearance: Markings consistent with prescribed medication Bottle Appearance: Standard pharmacy container. Clearly labeled. Filled Date: 01 / 01 / 2026 Last Medication intake:  Today

## 2024-11-21 NOTE — Patient Instructions (Signed)

## 2025-02-12 ENCOUNTER — Encounter: Admitting: Nurse Practitioner
